# Patient Record
Sex: Male | Born: 1950 | Race: Black or African American | Hispanic: No | Smoking: Current every day smoker
Health system: Southern US, Community
[De-identification: ages and names within clinical notes are randomized; demographics above are authoritative.]

## PROBLEM LIST (undated history)

## (undated) DIAGNOSIS — E43 Unspecified severe protein-calorie malnutrition: Secondary | ICD-10-CM

## (undated) DIAGNOSIS — I132 Hypertensive heart and chronic kidney disease with heart failure and with stage 5 chronic kidney disease, or end stage renal disease: Secondary | ICD-10-CM

## (undated) DIAGNOSIS — N186 End stage renal disease: Secondary | ICD-10-CM

## (undated) DIAGNOSIS — E785 Hyperlipidemia, unspecified: Secondary | ICD-10-CM

## (undated) DIAGNOSIS — I10 Essential (primary) hypertension: Secondary | ICD-10-CM

## (undated) DIAGNOSIS — I739 Peripheral vascular disease, unspecified: Secondary | ICD-10-CM

## (undated) DIAGNOSIS — Z992 Dependence on renal dialysis: Secondary | ICD-10-CM

## (undated) DIAGNOSIS — I5022 Chronic systolic (congestive) heart failure: Secondary | ICD-10-CM

## (undated) DIAGNOSIS — Z720 Tobacco use: Secondary | ICD-10-CM

## (undated) DIAGNOSIS — B1910 Unspecified viral hepatitis B without hepatic coma: Secondary | ICD-10-CM

---

## 2003-05-06 ENCOUNTER — Emergency Department
Admission: RE | Admit: 2003-05-06 | Payer: Self-pay | Source: Emergency Department | Attending: Emergency Medicine | Admitting: Emergency Medicine

## 2008-03-20 ENCOUNTER — Observation Stay
Admission: AD | Admit: 2008-03-20 | Disposition: A | Discharge status: Home / Self Care | Payer: Self-pay | Source: Ambulatory Visit | Admitting: Internal Medicine

## 2008-03-20 LAB — COMPREHENSIVE METABOLIC PANEL
ALT: 23 U/L (ref 3–36)
AST (SGOT): 21 U/L (ref 10–41)
Albumin/Globulin Ratio: 1 — ABNORMAL LOW (ref 1.1–1.8)
Albumin: 2.8 g/dL — ABNORMAL LOW (ref 3.4–4.9)
Alkaline Phosphatase: 82 U/L (ref 43–112)
BUN: 57 mg/dL — ABNORMAL HIGH (ref 8–20)
Bilirubin, Total: <0.1 mg/dL (ref 0.1–1.0)
CO2: 16 mEq/L — ABNORMAL LOW (ref 21–30)
Calcium: 8.2 mg/dL — ABNORMAL LOW (ref 8.6–10.2)
Chloride: 111 mEq/L — ABNORMAL HIGH (ref 98–107)
Creatinine: 6.6 mg/dL — ABNORMAL HIGH (ref 0.6–1.5)
Globulin: 2.7 g/dL (ref 2.0–3.7)
Glucose: 121 mg/dL — ABNORMAL HIGH (ref 70–100)
Potassium: 4.5 mEq/L (ref 3.6–5.0)
Protein, Total: 5.5 g/dL — ABNORMAL LOW (ref 6.0–8.0)
Sodium: 139 mEq/L (ref 136–146)

## 2008-03-20 LAB — MAGNESIUM: Magnesium: 1.7 mg/dL (ref 1.6–2.3)

## 2008-03-20 LAB — GFR

## 2008-03-20 LAB — CBC AND DIFFERENTIAL
Basophils Absolute: 0 /mm3 (ref 0.0–0.2)
Basophils: 0 % (ref 0–2)
Eosinophils Absolute: 0.1 /mm3 (ref 0.0–0.7)
Eosinophils: 1 % (ref 0–5)
Granulocytes Absolute: 9.6 /mm3 — ABNORMAL HIGH (ref 1.8–8.1)
Hematocrit: 26.2 % — ABNORMAL LOW (ref 42.0–52.0)
Hgb: 8.2 G/DL — ABNORMAL LOW (ref 13.0–17.0)
Immature Granulocytes Absolute: 0.1
Immature Granulocytes: 0 %
Lymphocytes Absolute: 2.6 /mm3 (ref 0.5–4.4)
Lymphocytes: 19 % (ref 15–41)
MCH: 26.7 PG — ABNORMAL LOW (ref 28.0–32.0)
MCHC: 31.3 G/DL — ABNORMAL LOW (ref 32.0–36.0)
MCV: 85.3 FL (ref 80.0–100.0)
MPV: 9.2 FL — ABNORMAL LOW (ref 9.4–12.3)
Monocytes Absolute: 1.2 /mm3 (ref 0.0–1.2)
Monocytes: 9 % (ref 0–11)
Neutrophils %: 71 % (ref 52–75)
Platelets: 222 /mm3 (ref 140–400)
RBC: 3.07 /mm3 — ABNORMAL LOW (ref 4.70–6.00)
RDW: 16.9 % — ABNORMAL HIGH (ref 11.5–15.0)
WBC: 13.55 /mm3 — ABNORMAL HIGH (ref 3.50–10.80)

## 2008-03-20 LAB — FOLATE: Folate: 7.1 ng/mL (ref 2.8–20.0)

## 2008-03-20 LAB — IRON PROFILE
Iron Saturation: 26 % (ref 15–50)
Iron: 45 ug/dL — ABNORMAL LOW (ref 49–181)
TIBC: 173 ug/dL — ABNORMAL LOW (ref 261–462)

## 2008-03-20 LAB — HEPATITIS B SURFACE ANTIGEN W/ REFLEX TO CONFIRMATION: Hepatitis B Surface Antigen: NEGATIVE

## 2008-03-20 LAB — HEPATITIS C ANTIBODY: Hepatitis C, AB: REACTIVE

## 2008-03-20 LAB — PHOSPHORUS: Phosphorus: 6.5 mg/dL — ABNORMAL HIGH (ref 2.5–4.5)

## 2008-03-20 LAB — VITAMIN B12: Vitamin B-12: 386 pg/mL (ref 225.0–950.0)

## 2008-03-20 LAB — FERRITIN: Ferritin: 63.9 ng/mL (ref 18.0–464.0)

## 2008-03-20 LAB — HEPATITIS B SURFACE ANTIBODY: HBSAB QN: 238 m[IU]/mL

## 2008-03-21 LAB — CBC AND DIFFERENTIAL
Basophils Absolute: 0 /mm3 (ref 0.0–0.2)
Basophils: 0 % (ref 0–2)
Eosinophils Absolute: 0.2 /mm3 (ref 0.0–0.7)
Eosinophils: 1 % (ref 0–5)
Granulocytes Absolute: 10.4 /mm3 — ABNORMAL HIGH (ref 1.8–8.1)
Hematocrit: 29 % — ABNORMAL LOW (ref 42.0–52.0)
Hgb: 9 G/DL — ABNORMAL LOW (ref 13.0–17.0)
Immature Granulocytes Absolute: 0.1
Immature Granulocytes: 1 %
Lymphocytes Absolute: 2.4 /mm3 (ref 0.5–4.4)
Lymphocytes: 17 % (ref 15–41)
MCH: 26.2 PG — ABNORMAL LOW (ref 28.0–32.0)
MCHC: 31 G/DL — ABNORMAL LOW (ref 32.0–36.0)
MCV: 84.3 FL (ref 80.0–100.0)
MPV: 9.1 FL — ABNORMAL LOW (ref 9.4–12.3)
Monocytes Absolute: 1.5 /mm3 — ABNORMAL HIGH (ref 0.0–1.2)
Monocytes: 10 % (ref 0–11)
Neutrophils %: 71 % (ref 52–75)
Platelets: 209 /mm3 (ref 140–400)
RBC: 3.44 /mm3 — ABNORMAL LOW (ref 4.70–6.00)
RDW: 16.7 % — ABNORMAL HIGH (ref 11.5–15.0)
WBC: 14.61 /mm3 — ABNORMAL HIGH (ref 3.50–10.80)

## 2008-03-21 LAB — URINALYSIS
Bilirubin, UA: NEGATIVE
Blood, UA: NEGATIVE
Glucose, UA: NEGATIVE
Ketones UA: NEGATIVE
Leukocyte Esterase, UA: NEGATIVE
Nitrite, UA: NEGATIVE
Protein, UR: 100 — AB
Specific Gravity UA POCT: 1.008 (ref 1.001–1.035)
Urine pH: 8 (ref 5.0–8.0)
Urobilinogen, UA: NORMAL mg/dL

## 2008-03-21 LAB — PHOSPHORUS: Phosphorus: 4.9 mg/dL — ABNORMAL HIGH (ref 2.5–4.5)

## 2008-03-21 LAB — URIC ACID: Uric acid: 4.3 mg/dL (ref 4.0–8.8)

## 2008-03-21 LAB — BASIC METABOLIC PANEL
BUN: 47 mg/dL — ABNORMAL HIGH (ref 8–20)
CO2: 21 mEq/L (ref 21–30)
Calcium: 8.2 mg/dL — ABNORMAL LOW (ref 8.6–10.2)
Chloride: 109 mEq/L — ABNORMAL HIGH (ref 98–107)
Creatinine: 5.6 mg/dL — ABNORMAL HIGH (ref 0.6–1.5)
Glucose: 93 mg/dL (ref 70–100)
Potassium: 4.4 mEq/L (ref 3.6–5.0)
Sodium: 138 mEq/L (ref 136–146)

## 2008-03-21 LAB — MAGNESIUM: Magnesium: 1.7 mg/dL (ref 1.6–2.3)

## 2008-03-21 LAB — RPR: RPR: NONREACTIVE

## 2008-03-24 LAB — HEPATITIS C VIRUS AB, RIBA CERNER

## 2009-02-28 ENCOUNTER — Emergency Department (HOSPITAL_COMMUNITY): Admission: EM | Admit: 2009-02-28 | Discharge: 2009-03-01 | Payer: Self-pay | Admitting: Emergency Medicine

## 2011-05-19 NOTE — H&P (Signed)
Account Number: [id number]      Document ID: [id number]      Admit Date: 03/20/2008            Patient Location: F1082-02      Patient Type: V            ATTENDING PHYSICIAN: Yvonna Alanis, MD                  CHIEF COMPLAINT:      High potassium and needs dialysis.            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  He has history      of AV shunt on his left forearm about 5 months ago, per the patient, and he      has been followed by with nephrologist.  Burgess Estelle his potassium was 6.7      after Kayexalate.  This morning, potassium came down to 5, and he was      arranged to come to the hospital for intensive dialysis today and tomorrow.       Patient is asymptomatic.  He denies any shortness of breath or leg      swelling, or mental status change, or nausea, vomiting.  By the time I saw      the patient at 4:00, he had his dialysis today.            HOME MEDICATIONS:      Sodium bicarb 650 mg daily, Cozaar 100 mg daily, Procrit 10,000 mg q.2      weeks, PhosLo 667 mg b.i.d. with meals, Vitron C 1 tablet daily, clonidine      0.2 mg p.o. t.i.d. a day, allopurinol 200 mg p.o. every day, colchicine 0.6      mg p.o. daily, and Norvasc 10 mg p.o. daily, lovastatin 20 mg p.o. q.h.s.            PAST MEDICAL HISTORY:      As mentioned above.  Uncontrolled hypertension, end-stage renal disease.      Today is his first dialysis.  Gout and anemia related to renal disease.            PAST SURGICAL HISTORY:      Left forearm AV shunt placement in probably March 2009.            SOCIAL HISTORY:      He smokes 1 pack a day for about 40 years.  He quit drinking alcohol.  He      lives alone.  Works for [employer] for [employer]	[place].            REVIEW OF SYSTEMS:      HEENT:  Denies coughing or sinus congestion, ear  pain.      GENERAL:  Denies fever or chills.      PULMONARY:  Denies coughing or shortness of breath.      CARDIAC:  Denies palpitation or chest pain.      GASTROINTESTINAL:  Denies nausea, vomiting, abdominal pain, constipation,      or diarrhea.      GENITOURINARY:  Denies dysuria.  He does make urine.      MUSCULOSKELETAL:  When he has a gout flareup, he normally gets pain,  swelling, tenderness over his knees and elbows, and he does not have any of      those problem right now, and he usually takes prednisone.            PHYSICAL EXAMINATION:      VITAL SIGNS:  Temperature 97.6, heart rate 78, respiratory rate 16, blood      pressure 177/87, O2 sat 100% on room air.      GENERAL:  This is an [place] American male, age appropriate, appears      comfortable, alert and oriented x3.      HEENT:  Pupils equal, round, reactive to light.  Oral no lesion.  No sinus      congestion.      NECK:  No JVD, no lymphadenopathy, no thyromegaly.      LUNGS:  Clear throughout.  No wheezing or crackles.      HEART:  Regular rate, rhythm without murmur or gallops, or rubs.      ABDOMEN:  Soft, nontender, nondistended.      EXTREMITIES:  No pedal edema.  2+ peripheral pulses.      NEUROLOGIC:   No focal deficits seen.      SKIN:  There is a tiny red, raised, with a white head comedone on his left      face.            LABORATORY DATA:      WBC 13.5, hemoglobin 8.2, hematocrit 26.2, platelets 222.  Sodium 139,      potassium 4.5, chloride 111, CO2 16, BUN 57, creatinine 6.6, glucose 121,      calcium 8.2, magnesium 1.7, phosphorous 6.5.  LFT within normal range.      Iron 45, and TIBC 173.            ASSESSMENT AND PLAN:      This is a 60 year old African American male with end-stage renal disease,      uncontrolled hypertension, anemia due to renal, brought here because his      potassium went up to 6.7 without dialysis.  So, today is his first      dialysis.  He needs to stay in the hospital for observation, and he is       going to have dialysis again tomorrow, and then renal will arrange him for      routine dialysis probably.  He is going to be continuing his home      medication for hypertension and for gout, which is stable at this time.      The blood pressure is elevated, which is his chronic problem, also probably      because he skipped his lunch dose today, so we are going to make sure he      gets his evening dose.  The anemia definitely consistent with end-stage      renal disease.  The nephrologist already made arrangement for Epogen during      dialysis, and he gets low-dose iron for that as well.  Tomorrow, after      dialysis, if he still does well, then he can be discharged.                        Electronic Signing Provider      _______________________________     Date/Time Signed: _____________      Yvonna Alanis MD ([id number])            D:  03/20/2008 16:22 PM by Yvonna Alanis, MD ([id number])      T:  03/20/2008 17:26 PM by [id number]          Everlean Cherry: [id number]) (Doc ID: [id number])                  cc:

## 2015-01-19 ENCOUNTER — Telehealth: Payer: Self-pay | Admitting: Surgery

## 2015-01-19 NOTE — Telephone Encounter (Signed)
01/19/2015 KC  Called patient and left my second message will try and call patient back tomorrow.

## 2015-01-20 ENCOUNTER — Telehealth: Payer: Self-pay | Admitting: Surgery

## 2015-01-20 NOTE — Telephone Encounter (Signed)
01/20/2015 KC  I have attempted to call the patient four times and unfortunately he has not called me back. I will try a couple of more times but if there is no answer I will wait for the patient to call me.

## 2016-02-11 ENCOUNTER — Encounter (HOSPITAL_COMMUNITY): Payer: Self-pay | Admitting: Emergency Medicine

## 2016-02-11 ENCOUNTER — Telehealth: Payer: Self-pay | Admitting: *Deleted

## 2016-02-11 ENCOUNTER — Emergency Department (HOSPITAL_COMMUNITY)
Admission: EM | Admit: 2016-02-11 | Discharge: 2016-02-11 | Disposition: A | Discharge status: Home / Self Care | Payer: Medicare Other | Attending: Emergency Medicine | Admitting: Emergency Medicine

## 2016-02-11 DIAGNOSIS — F17210 Nicotine dependence, cigarettes, uncomplicated: Secondary | ICD-10-CM | POA: Diagnosis not present

## 2016-02-11 DIAGNOSIS — N186 End stage renal disease: Secondary | ICD-10-CM

## 2016-02-11 DIAGNOSIS — Z992 Dependence on renal dialysis: Secondary | ICD-10-CM | POA: Diagnosis present

## 2016-02-11 LAB — BASIC METABOLIC PANEL
ANION GAP: 17 — AB (ref 5–15)
BUN: 47 mg/dL — AB (ref 6–20)
CALCIUM: 10.5 mg/dL — AB (ref 8.9–10.3)
CO2: 32 mmol/L (ref 22–32)
CREATININE: 10.36 mg/dL — AB (ref 0.61–1.24)
Chloride: 88 mmol/L — ABNORMAL LOW (ref 101–111)
GFR calc Af Amer: 5 mL/min — ABNORMAL LOW (ref >60)
GFR, EST NON AFRICAN AMERICAN: 5 mL/min — AB (ref >60)
GLUCOSE: 87 mg/dL (ref 65–99)
Potassium: 4.5 mmol/L (ref 3.5–5.1)
Sodium: 137 mmol/L (ref 135–145)

## 2016-02-11 LAB — CBC
HEMATOCRIT: 40.6 % (ref 39.0–52.0)
Hemoglobin: 12.7 g/dL — ABNORMAL LOW (ref 13.0–17.0)
MCH: 26.4 pg (ref 26.0–34.0)
MCHC: 31.3 g/dL (ref 30.0–36.0)
MCV: 84.4 fL (ref 78.0–100.0)
PLATELETS: 179 10*3/uL (ref 150–400)
RBC: 4.81 MIL/uL (ref 4.22–5.81)
RDW: 14.9 % (ref 11.5–15.5)
WBC: 8.5 10*3/uL (ref 4.0–10.5)

## 2016-02-11 NOTE — ED Notes (Signed)
Pt in needing dialysis, MWF routine

## 2016-02-11 NOTE — ED Provider Notes (Signed)
CSN: [id number]     Arrival date & time 02/11/16  0555 History   First MD Initiated Contact with Patient 02/11/16 [phone]     Chief Complaint  Patient presents with  . Needs Dialysis     (Consider location/radiation/quality/duration/timing/severity/associated sxs/prior Treatment) HPI Comments: Patient with history of end-stage renal disease on dialysis for the past 4 years, with pacemaker/defibrillator -- presents with request for dialysis. Patient states that he moved from [place] yesterday. He did not tell his dialysis center that he was moving. Currently has no complaints. States that he does not feel fluid overloaded or short of breath. He states that he has not been drinking very much. No recent illnesses or other complaints. Patient dialyzes through a left upper extremity fistula. The onset of this condition was acute. The course is constant. Aggravating factors: none. Alleviating factors: none.   Previous dialysis center:  [facility] [phone] [address] Laurell Josephs. [place], [place] [address] [phone]  Per discussion with dialysis center: patient attempted to make arrangements to dialyze at [facility]. He was denied from this center due to previous behavioral issues.   The history is provided by the patient.    Past Medical History  Diagnosis Date  . End stage renal disease (HCC)    History reviewed. No pertinent past surgical history. No family history on file. Social History  Substance Use Topics  . Smoking status: None  . Smokeless tobacco: None  . Alcohol Use: None    Review of Systems  Constitutional: Negative for fever.  HENT: Negative for rhinorrhea and sore throat.   Eyes: Negative for redness.  Respiratory: Negative for cough.   Cardiovascular: Negative for chest pain and leg swelling.  Gastrointestinal: Negative for nausea, vomiting, abdominal pain and diarrhea.  Genitourinary: Negative for dysuria.  Musculoskeletal: Negative for myalgias.   Skin: Negative for rash.  Neurological: Negative for headaches.      Allergies  Review of patient's allergies indicates no known allergies.  Home Medications   Prior to Admission medications   Not on File   BP 101/72 mmHg  Pulse 81  Temp(Src) 97.5 F (36.4 C) (Oral)  Resp 17  Ht  (1.88 m)  Wt 69 kg  BMI 19.52 kg/m2  SpO2 97%   Physical Exam  Constitutional: He appears well-developed and well-nourished.  HENT:  Head: Normocephalic and atraumatic.  Mouth/Throat: Oropharynx is clear and moist.  Eyes: Conjunctivae are normal. Pupils are equal, round, and reactive to light. Right eye exhibits no discharge. Left eye exhibits no discharge.  Neck: Normal range of motion. Neck supple. JVD (Mild) present.  Cardiovascular: Normal rate, regular rhythm and normal heart sounds.   No murmur heard. Pulmonary/Chest: Effort normal and breath sounds normal. No respiratory distress. He has no wheezes. He has no rales.  Abdominal: Soft. There is no tenderness.  Musculoskeletal: He exhibits no edema or tenderness.  Neurological: He is alert.  Skin: Skin is warm and dry.  Psychiatric: He has a normal mood and affect.  Nursing note and vitals reviewed.   ED Course  Procedures (including critical care time) Labs Review Labs Reviewed  CBC - Abnormal; Notable for the following:    Hemoglobin 12.7 (*)    All other components within normal limits  BASIC METABOLIC PANEL - Abnormal; Notable for the following:    Chloride 88 (*)    BUN 47 (*)    Creatinine, Ser 10.36 (*)    Calcium 10.5 (*)    GFR calc  non Af Amer 5 (*)    GFR calc Af Amer 5 (*)    Anion gap 17 (*)    All other components within normal limits     EKG Interpretation   Date/Time:  Friday February 11 2016 06:43:26 EDT Ventricular Rate:  82 PR Interval:    QRS Duration: 172 QT Interval:  431 QTC Calculation: 504 R Axis:   -107 Text Interpretation:  Age not entered, assumed to be  65 years old for  purpose of ECG  interpretation Atrial-sensed ventricular-paced rhythm No  further analysis attempted due to paced rhythm Confirmed by POLLINA  MD,  CHRISTOPHER [phone]) on 02/11/2016 6:47:43 AM      6:43 AM Patient seen and examined. Work-up initiated. Will evaluate for need for emergent dialysis.    Vital signs reviewed and are as follows: BP 118/78 mmHg  Pulse 77  Temp(Src) 97.5 F (36.4 C) (Oral)  Resp 17  Ht 6\' 2"  (1.88 m)  Wt 69 kg  BMI 19.52 kg/m2  SpO2 97%  7:48 AM Labs as expected for dialysis patient.   Spoke with Dr. Abel Presto. Reccs case manager to be involved to make arrangements for dialysis. Will contact after 8am.   9:23 AM Case manager recommends that patient call his facility to arrange transfer to dialysis center in [place].   Dr. Freida Busman aware of patient and discussions held with case management, patient, and nursing.   I called the patient's dialysis center on his behalf. I spoke with a manager there who states that they tried to set up patient at dialysis here about a month ago. He was declined after discussion due to prior behavioral issues. Patient did not notify the center on Wednesday that he was moving. They are happy to send any records but state patient will have to contact dialysis centers and provide contact info to his previous dialysis center.   9:52 AM I called Dr. Abel Presto again to see if he had other recommendations prior to discharge. He took patient's information and will have his office check to see about patient's status.   At this point, patient does not need emergent dialysis. I informed him of the multiple discussions occuring today. I have provided him with a list of dialysis centers in [place] with contact information.   I encouraged him to return if he develops shortness of breath, chest pain, lightheadedness, fever, or other symptoms he feels are related to not having dialysis. I've encouraged him to return in 48 hours for recheck if he does not hear  anything about dialyzing at an outpatient facility. Patient visibly unhappy, however understands current situation and plan.  Will discharge to home at this time.   MDM   Final diagnoses:  End stage renal disease (HCC)   Patient with end-stage renal disease, new to the area and does not have a dialysis center. Multiple discussions held as above and attempt to arrange dialysis here. At this point, patient will be discharged from the emergency department with no concrete plan for dialysis. He has no current indications for emergent dialysis. Patient does not appear fluid overloaded and does not have any shortness of breath. No lower extremity edema. No chest pain. Patient has a pacemaker and defibrillator. Potassium is normal range. BUN/creatinine is as expected, no gross symptoms or complications from uremia. Follow-up as above.   Renne Crigler, PA-C 02/11/16 [id number]  Gilda Crease, MD 02/12/16 [phone]

## 2016-02-11 NOTE — Discharge Instructions (Signed)
Return to the Emergency Department if you have shortness of breath, chest pain, fever, lightheadedness, or other concerns. If you feel well, return in 48 hours to be rechecked.   You may contact one of the centers listed below to attempt to arrange dialysis:   [facility] [place], [place] [phone] Open until 5:00 PM  [facility] [place], [place] [phone]  [facility] [place], [place] [phone] Open until 5:00 PM  [facility] [place], [place] [phone] Open until 5:00 PM  [facility] [place], [place] [phone] Open until 5:00 PM  [facility] [place], [place] [phone]

## 2016-02-11 NOTE — Progress Notes (Signed)
1015 ED CM faxed clinicals to [phone] with fax confirmation Sarah at [facility] CM, EDPA Ivin Booty and ED RN updated

## 2016-02-11 NOTE — Progress Notes (Addendum)
ED CM contacted by [facility] ED SW, Morrie Sheldon about this pt in need of getting set up with a dialysis, pt just moved here from [place] on yesterday, He did not tell his dialysis center he was moving Gets Dialysis MWF and wants to get dialysis today  Morrie Sheldon stated that the [facility] ED RN Chrislyn x [phone] told her they would not provide dialysis today as it is not emergent and the pt is not thrilled about that, MD is wanting a plan prior to d/c  ED Cm review briefly ED NP/PA note  0820 Cm called to speak with pt ED RN who reported pt had been "inappropriate" with staff but was available for Cm to speak with via telephone  CM inquire of pt the name of his dialysis company Pt informed Cm he left [place] on "yesterday to come visit my mother" Pt informed Cm he did not tell his [facility] staff in [place] that he was leaving yesterday Cm discussed with the pt the importance and his responsibility of always contacting his dialysis center of any change including his location. Pt states he was told if he had any problems to go to the ED" Pt got his previous dialysis card out of his pocket to confirm the demographics his previous dialysis center Cm discussed with pt she was informed by ED SW that the Rn stated dialysis would not be provided today as it is not emergent Pt voiced frustration of not being told by ED staff that dialysis could not be done Cm referred him to his ED RN and ED PA/NP Another ED RN came to pt room after he started hollering for a nurse vs pressing his call button.  Cm able to get the nurse to confirmed [phone] as the number for the dialysis center since the pt stated he could not see CM encouraged to call the dialysis on his cell with ED Cm on the other line Cm assisted with providing the contact number as he entered it in. Cm never noted pt speaking with staff but told Cm they would not answer Pt can not recall his SW name or other contacts at the facility Pt in conversation states "this is why I left them. The  social worker there was not worth shit."  Referring to the dialysis center in [place] Pt disconnected from ED CM stating "who does this fucking phone belong to "  0830  CM attempted to reach a staff at [phone]  to assist her with pt but informed SW not there at this time 0852 CM received a call from [facility] ED RN Chrislyn who stated she heard CM spoke with pt. Cm informed her pt stated he left without telling his dialysis center he was leaving and they were assisting him with but Cm has to speak with the correct person who is familiar with the pt ED RN asked about pt d/c Cm referred her to ED PA/NP about pt d/c and informed her CM would return a call to her as soon as CM was able to speak with a dialysis staff about services for the this pt Asked RN if she could have the ED PA/NP speak with pt about ED plan of care and not being able to get dialysis after pt informed CM he had not been informed  [phone] ED CM finally able to find a staff member of pt previous dialysis center to speak with While speaking with Varinder because the general dialysis SW  is not there today Pt has received Dialysis last on "this Wednesday" Pt  Cm received a call from [facility] ED CM and informed her this Cm attempting to get an update/collateral on pt dialysis status and a possible transfer dialysis assistance to a local [employer] or near by facility. Varinder states pt informed them he would be moving initially in May 2017 and she sent his information to 3 facilities Pt was denied services at one facility's Wellsite geologist after peer to peers completed related pt "drugs" :there were so many issues with this patient" Pt was accepting by one facility but he refused the facility related to transportation issues to get to the facility in which Varinder states they were working on the transportation concerns. varinder was given Cm fax number to fax clinicals adn Cm mobile number She stated she would call the admission line there to  get the information on the 3 facilities arrangements were initiated with  She recalled that pt was accepted at [facility] in [place] when Cm read the names of the available dialysis center near [place] 1610 CM spoke with Suzette Battiest when called the [facility], [place] [phone] who confirms pt is listed as still active with the [facility] facility and the last request for transfer was noted in May 2017 but noted pt did not call nor show to this faciity 2017 She spoke with her Lead and was informed to take a referral from this Cm for at [facility] transfer of pt plus any fax clinicals in ATTN to [facility] (fax # [phone]) from this Cm (possible face sheet, treatment plan, labs, hep panel, etc) and then to call the [place] facility also to get information. Veronica given the [employer] address on [address] pt provided and telephone numbers in [place] noted availability at the [facility] center but pt acceptance is still pending a review "may take twenty four hours" She provided the request svc number as [phone]

## 2016-02-11 NOTE — ED Notes (Signed)
Pt on phone with Selena Batten, Case Manager at [facility], to establish regular dialysis.

## 2016-02-11 NOTE — Progress Notes (Signed)
CSW consult acknowledged. Patient is in need of getting set up with a dialysis center. He just moved to [place] from [place] on yesterday. He did not tell his dialysis center that he was moving. Patient gets dialysis on MWF and is requesting dialysis today. Patient will not receive dialysis today as it is not an emergent need. CSW has staffed with covering Case Manager for assistance with getting Patient set up with a dialysis center. No further CSW needs identified at this time. CSW signing off. Please contact if new need(s) arise.     Lance Muss, LCSW [facility] ED/66M Clinical Social Worker [phone]

## 2016-02-11 NOTE — ED Notes (Signed)
Pt moved from [place] yesterday, needs to get set up with a dialysis center

## 2016-02-11 NOTE — Progress Notes (Signed)
CSW engaged with Patient at his bedside alongside Bedside RN and Case Manager. CSW explained to Patient that he would need to contact his previous facility so that he can get clipped in [place]. CSW explained that RN Case Manager is working on assisting him with getting set up with a facility in [place]. CSW explained importance and his responsibility of always contacting his dialysis center of any change including his location.          Lance Muss, LCSW [facility] ED/62M Clinical Social Worker [phone]

## 2016-02-14 ENCOUNTER — Encounter (HOSPITAL_COMMUNITY): Payer: Self-pay | Admitting: Emergency Medicine

## 2016-02-14 ENCOUNTER — Non-Acute Institutional Stay (HOSPITAL_COMMUNITY)
Admission: EM | Admit: 2016-02-14 | Discharge: 2016-02-14 | Disposition: A | Discharge status: Home / Self Care | Payer: Medicare Other | Attending: Emergency Medicine | Admitting: Emergency Medicine

## 2016-02-14 DIAGNOSIS — Z992 Dependence on renal dialysis: Secondary | ICD-10-CM | POA: Diagnosis not present

## 2016-02-14 DIAGNOSIS — F17210 Nicotine dependence, cigarettes, uncomplicated: Secondary | ICD-10-CM | POA: Insufficient documentation

## 2016-02-14 DIAGNOSIS — E875 Hyperkalemia: Secondary | ICD-10-CM

## 2016-02-14 DIAGNOSIS — N186 End stage renal disease: Secondary | ICD-10-CM | POA: Diagnosis present

## 2016-02-14 LAB — CBC WITH DIFFERENTIAL/PLATELET
BASOS PCT: 0 %
Basophils Absolute: 0 10*3/uL (ref 0.0–0.1)
EOS ABS: 0.2 10*3/uL (ref 0.0–0.7)
Eosinophils Relative: 2 %
HEMATOCRIT: 39.7 % (ref 39.0–52.0)
HEMOGLOBIN: 12.7 g/dL — AB (ref 13.0–17.0)
LYMPHS ABS: 1.5 10*3/uL (ref 0.7–4.0)
Lymphocytes Relative: 15 %
MCH: 26.8 pg (ref 26.0–34.0)
MCHC: 32 g/dL (ref 30.0–36.0)
MCV: 83.8 fL (ref 78.0–100.0)
MONO ABS: 0.6 10*3/uL (ref 0.1–1.0)
MONOS PCT: 6 %
Neutro Abs: 7.8 10*3/uL — ABNORMAL HIGH (ref 1.7–7.7)
Neutrophils Relative %: 77 %
Platelets: 204 10*3/uL (ref 150–400)
RBC: 4.74 MIL/uL (ref 4.22–5.81)
RDW: 14.8 % (ref 11.5–15.5)
WBC: 10.1 10*3/uL (ref 4.0–10.5)

## 2016-02-14 LAB — BASIC METABOLIC PANEL
Anion gap: 16 — ABNORMAL HIGH (ref 5–15)
BUN: 85 mg/dL — AB (ref 6–20)
CALCIUM: 10.4 mg/dL — AB (ref 8.9–10.3)
CHLORIDE: 96 mmol/L — AB (ref 101–111)
CO2: 27 mmol/L (ref 22–32)
CREATININE: 17.02 mg/dL — AB (ref 0.61–1.24)
GFR calc Af Amer: 3 mL/min — ABNORMAL LOW (ref >60)
GFR calc non Af Amer: 3 mL/min — ABNORMAL LOW (ref >60)
Glucose, Bld: 81 mg/dL (ref 65–99)
Potassium: 5.7 mmol/L — ABNORMAL HIGH (ref 3.5–5.1)
Sodium: 139 mmol/L (ref 135–145)

## 2016-02-14 LAB — ALT: ALT: 8 U/L — AB (ref 17–63)

## 2016-02-14 MED ORDER — LIDOCAINE HCL (PF) 1 % IJ SOLN: 5.0000 mL | INTRAMUSCULAR | Status: DC | PRN

## 2016-02-14 MED ORDER — HEPARIN SODIUM (PORCINE) 1000 UNIT/ML DIALYSIS: 1000.0000 [IU] | INTRAMUSCULAR | Status: DC | PRN

## 2016-02-14 MED ORDER — SODIUM CHLORIDE 0.9 % IV SOLN: 100.0000 mL | INTRAVENOUS | Status: DC | PRN

## 2016-02-14 MED ORDER — PENTAFLUOROPROP-TETRAFLUOROETH EX AERO: 1.0000 "application " | INHALATION_SPRAY | CUTANEOUS | Status: DC | PRN

## 2016-02-14 MED ORDER — ALTEPLASE 2 MG IJ SOLR: 2.0000 mg | Freq: Once | INTRAMUSCULAR | Status: DC | PRN

## 2016-02-14 MED ORDER — LIDOCAINE-PRILOCAINE 2.5-2.5 % EX CREA: 1.0000 "application " | TOPICAL_CREAM | CUTANEOUS | Status: DC | PRN

## 2016-02-14 NOTE — Procedures (Signed)
Pt seen on HD- moved from [place] to help with sick family members- will be staying on [address] which I think is closest to [employer]  [facility] [employer] [facility] HD refused him due to "behavioral issues" pt admits to "cussing people out" due to access sticking and repeated procedures.  There was no issue in sticking here today.  [facility] has not refused but there may be a transportation issue as would be the issue if he were to try to go to [place].  I have told him to return here for treatment on Wednesday- I will see if [place] will be a possibility - have told Judi from HD to send papers.  Hopefully he will only need one more treatment here at [facility]-    Kaymarie Wynn A

## 2016-02-14 NOTE — Care Management Note (Signed)
Mr. Dolton has returned to [place] today.  States still has not heard from any dialysis centers.  States tried to get into HD on [address] - refused and on [address] - refused.  Informed patient that from notes it appears he has been refused by [facility], but [facility] and [facility] [facility] accepted but he initially declined these centers.  CM on Friday was working with [employer] on getting him set up.  States has not had a call from [facility] HD.  I contacted at [phone].  States Lattie Corns -working on svc number [phone].  Will call this CM back with update.  Talked with patient about [facility] working on getting him in.  Wants to know where it is in [place] - they have three and who transports and what the cost is.  Informed will check on those questions when they call back.  At this point Renal has been consulted.  Will continue to follow and wait for call back from [facility].

## 2016-02-14 NOTE — ED Provider Notes (Signed)
CSN: [id number]     Arrival date & time 02/14/16  1020 History   First MD Initiated Contact with Patient 02/14/16 1118     Chief Complaint  Patient presents with  . Vascular Access Problem    pt in need of dialysis     (Consider location/radiation/quality/duration/timing/severity/associated sxs/prior Treatment) HPI Patient reports that he is coming to the emergency department for dialysis. He reports last dialysis was Wednesday. That was 5 days ago. He states that he has been refused at the dialysis center that he was sent to last. He denies shortness of breath at this time. He denies swelling. Past Medical History  Diagnosis Date  . End stage renal disease (HCC)    History reviewed. No pertinent past surgical history. History reviewed. No pertinent family history. Social History  Substance Use Topics  . Smoking status: Current Every Day Smoker -- 0.50 packs/day    Types: Cigarettes  . Smokeless tobacco: None  . Alcohol Use: No    Review of Systems 10 Systems reviewed and are negative for acute change except as noted in the HPI.    Allergies  Review of patient's allergies indicates no known allergies.  Home Medications   Prior to Admission medications   Medication Sig Start Date End Date Taking? Authorizing Provider  cinacalcet (SENSIPAR) 30 MG tablet Take 30 mg by mouth daily.   Yes Historical Provider, MD  hydrOXYzine (ATARAX/VISTARIL) 50 MG tablet Take 50 mg by mouth every 8 (eight) hours as needed for itching.  12/25/15  Yes Historical Provider, MD  oxyCODONE-acetaminophen (PERCOCET) 10-325 MG tablet Take 1 tablet by mouth every 8 (eight) hours as needed for pain.   Yes Historical Provider, MD  temazepam (RESTORIL) 30 MG capsule Take 30 mg by mouth at bedtime. 01/19/16  Yes Historical Provider, MD   BP 127/74 mmHg  Pulse 82  Temp(Src) 97.8 F (36.6 C) (Oral)  Resp 20  Ht  (1.88 m)  Wt 154 lb 1.6 oz (69.9 kg)  BMI 19.78 kg/m2  SpO2 97% Physical Exam   Constitutional: He is oriented to person, place, and time.  The patient is thin. He is nontoxic and alert. No acute distress.  HENT:  Head: Normocephalic and atraumatic.  Eyes: EOM are normal.  Neck: Neck supple.  Cardiovascular: Normal rate, regular rhythm, normal heart sounds and intact distal pulses.   Pulmonary/Chest: Effort normal and breath sounds normal.  Abdominal: Soft. Bowel sounds are normal. He exhibits no distension. There is no tenderness.  Musculoskeletal: Normal range of motion. He exhibits no edema.  Patient has generalized muscular atrophy. He does not have peripheral edema.  Neurological: He is alert and oriented to person, place, and time. He has normal strength. Coordination normal. GCS eye subscore is 4. GCS verbal subscore is 5. GCS motor subscore is 6.  Skin: Skin is warm, dry and intact.  Patient has very dry, scaling skin. Multiple small areas of pigmentation. No acute appearing rashes.  Psychiatric: He has a normal mood and affect.    ED Course  Procedures (including critical care time) Labs Review Labs Reviewed  BASIC METABOLIC PANEL - Abnormal; Notable for the following:    Potassium 5.7 (*)    Chloride 96 (*)    BUN 85 (*)    Creatinine, Ser 17.02 (*)    Calcium 10.4 (*)    GFR calc non Af Amer 3 (*)    GFR calc Af Amer 3 (*)    Anion gap 16 (*)  All other components within normal limits  CBC WITH DIFFERENTIAL/PLATELET - Abnormal; Notable for the following:    Hemoglobin 12.7 (*)    Neutro Abs 7.8 (*)    All other components within normal limits    Imaging Review No results found. I have personally reviewed and evaluated these images and lab results as part of my medical decision-making.   EKG Interpretation None     Consult: Dr. Malachi Bonds will arrange dialysis as the patient has elevated potassium today. MDM   Final diagnoses:  ESRD (end stage renal disease) (HCC)  Hyperkalemia   Patient presents asymptomatic reporting he needs  dialysis. Patient has moved from out of town. He does not have dyspnea or rails. He does not have peripheral edema. Labs however do show potassium at 5.7 and at this point patient will require scheduled dialysis. Dr. Westley Chandler will arrange a dialysis time for him. He otherwise has no acute medical complaints. Case Management has been involved in trying to establish patient's ongoing care.    Arby Barrette, MD 02/14/16 [phone]

## 2016-02-14 NOTE — Progress Notes (Signed)
Patient discharged home tolerated well. Ambulated off unit with belongings.

## 2016-02-14 NOTE — ED Notes (Signed)
Pt comes in needing dialysis. Pt had dialysis last Wednesday. Pt on MWF dialysis schedule. Pt has +A/V fistula to left lower arm. Pt in NAD.

## 2016-02-15 ENCOUNTER — Telehealth: Payer: Self-pay | Admitting: *Deleted

## 2016-02-15 LAB — HEPATITIS B CORE ANTIBODY, TOTAL: Hep B Core Total Ab: POSITIVE — AB

## 2016-02-15 LAB — HEPATITIS B SURFACE ANTIBODY,QUALITATIVE: Hep B S Ab: REACTIVE

## 2016-02-15 LAB — HEPATITIS B SURFACE ANTIGEN: Hepatitis B Surface Ag: NEGATIVE

## 2016-02-16 ENCOUNTER — Non-Acute Institutional Stay (HOSPITAL_COMMUNITY)
Admission: EM | Admit: 2016-02-16 | Discharge: 2016-02-16 | Disposition: A | Discharge status: Home / Self Care | Payer: Medicare Other | Attending: Emergency Medicine | Admitting: Emergency Medicine

## 2016-02-16 ENCOUNTER — Encounter (HOSPITAL_COMMUNITY): Payer: Self-pay | Admitting: Emergency Medicine

## 2016-02-16 DIAGNOSIS — Z992 Dependence on renal dialysis: Secondary | ICD-10-CM | POA: Insufficient documentation

## 2016-02-16 DIAGNOSIS — N186 End stage renal disease: Secondary | ICD-10-CM | POA: Diagnosis present

## 2016-02-16 DIAGNOSIS — F17210 Nicotine dependence, cigarettes, uncomplicated: Secondary | ICD-10-CM | POA: Insufficient documentation

## 2016-02-16 HISTORY — DX: End stage renal disease: N18.6

## 2016-02-16 HISTORY — DX: Dependence on renal dialysis: Z99.2

## 2016-02-16 LAB — BASIC METABOLIC PANEL
ANION GAP: 16 — AB (ref 5–15)
BUN: 39 mg/dL — AB (ref 6–20)
CHLORIDE: 96 mmol/L — AB (ref 101–111)
CO2: 27 mmol/L (ref 22–32)
Calcium: 10.5 mg/dL — ABNORMAL HIGH (ref 8.9–10.3)
Creatinine, Ser: 11.9 mg/dL — ABNORMAL HIGH (ref 0.61–1.24)
GFR calc Af Amer: 4 mL/min — ABNORMAL LOW (ref >60)
GFR, EST NON AFRICAN AMERICAN: 4 mL/min — AB (ref >60)
Glucose, Bld: 168 mg/dL — ABNORMAL HIGH (ref 65–99)
POTASSIUM: 4.6 mmol/L (ref 3.5–5.1)
SODIUM: 139 mmol/L (ref 135–145)

## 2016-02-16 LAB — CBC WITH DIFFERENTIAL/PLATELET
BASOS ABS: 0 10*3/uL (ref 0.0–0.1)
Basophils Relative: 0 %
EOS PCT: 2 %
Eosinophils Absolute: 0.3 10*3/uL (ref 0.0–0.7)
HEMATOCRIT: 42.6 % (ref 39.0–52.0)
HEMOGLOBIN: 13.1 g/dL (ref 13.0–17.0)
LYMPHS ABS: 2.3 10*3/uL (ref 0.7–4.0)
LYMPHS PCT: 22 %
MCH: 26.3 pg (ref 26.0–34.0)
MCHC: 30.8 g/dL (ref 30.0–36.0)
MCV: 85.4 fL (ref 78.0–100.0)
Monocytes Absolute: 0.8 10*3/uL (ref 0.1–1.0)
Monocytes Relative: 8 %
NEUTROS ABS: 7.1 10*3/uL (ref 1.7–7.7)
NEUTROS PCT: 68 %
PLATELETS: 224 10*3/uL (ref 150–400)
RBC: 4.99 MIL/uL (ref 4.22–5.81)
RDW: 15 % (ref 11.5–15.5)
WBC: 10.6 10*3/uL — AB (ref 4.0–10.5)

## 2016-02-16 MED ORDER — SODIUM CHLORIDE 0.9 % IV SOLN: 100.0000 mL | INTRAVENOUS | Status: DC | PRN

## 2016-02-16 MED ORDER — HEPARIN SODIUM (PORCINE) 1000 UNIT/ML DIALYSIS
1000.0000 [IU] | INTRAMUSCULAR | Status: DC | PRN
  Filled 2016-02-16: qty 1

## 2016-02-16 MED ORDER — HEPARIN SODIUM (PORCINE) 1000 UNIT/ML DIALYSIS
20.0000 [IU]/kg | INTRAMUSCULAR | Status: DC | PRN
  Filled 2016-02-16: qty 2

## 2016-02-16 MED ORDER — LIDOCAINE HCL (PF) 1 % IJ SOLN: 5.0000 mL | INTRAMUSCULAR | Status: DC | PRN

## 2016-02-16 MED ORDER — LIDOCAINE-PRILOCAINE 2.5-2.5 % EX CREA
1.0000 | TOPICAL_CREAM | CUTANEOUS | Status: DC | PRN
Start: 2016-02-16 — End: 2016-02-16

## 2016-02-16 MED ORDER — PENTAFLUOROPROP-TETRAFLUOROETH EX AERO
1.0000 | INHALATION_SPRAY | CUTANEOUS | Status: DC | PRN
Start: 2016-02-16 — End: 2016-02-16
  Filled 2016-02-16: qty 30

## 2016-02-16 MED ORDER — ALTEPLASE 2 MG IJ SOLR: 2.0000 mg | Freq: Once | INTRAMUSCULAR | Status: DC | PRN

## 2016-02-16 NOTE — ED Provider Notes (Signed)
CSN: [id number]     Arrival date & time 02/16/16  0554 History   First MD Initiated Contact with Patient 02/16/16 0602     CC: here for dialysis  HPI   Willie Andrade is an 65 y.o. male with history of ESRD on dialysis who presents to the ED today for scheduled dialysis. He recently moved to [place] from [place] and is in the process of establishing local dialysis center. He has been seen in the [facility] ED twice before and has worked with our nephrology team and case management to establish local dialysis care. He was last seen this Monday and was told by Dr. Kathrene Bongo to return to day for dialysis. This is true based on EMR review. Pt denies any symptoms at this time. Denies chest pain, SOB, swelling, urinary problems, nausea, vomiting.   Past Medical History  Diagnosis Date  . End stage renal disease (HCC)   . ESRD (end stage renal disease) on dialysis [facility])    History reviewed. No pertinent past surgical history. No family history on file. Social History  Substance Use Topics  . Smoking status: Current Every Day Smoker -- 0.00 packs/day    Types: Cigarettes  . Smokeless tobacco: None  . Alcohol Use: No    Review of Systems  All other systems reviewed and are negative.     Allergies  Review of patient's allergies indicates no known allergies.  Home Medications   Prior to Admission medications   Medication Sig Start Date End Date Taking? Authorizing Provider  cinacalcet (SENSIPAR) 30 MG tablet Take 30 mg by mouth daily.   Yes Historical Provider, MD  hydrOXYzine (ATARAX/VISTARIL) 50 MG tablet Take 50 mg by mouth every 8 (eight) hours as needed for itching.  12/25/15  Yes Historical Provider, MD  oxyCODONE-acetaminophen (PERCOCET) 10-325 MG tablet Take 1 tablet by mouth every 8 (eight) hours as needed for pain.   Yes Historical Provider, MD  temazepam (RESTORIL) 30 MG capsule Take 30 mg by mouth at bedtime. 01/19/16  Yes Historical Provider, MD   BP 115/79 mmHg  Pulse 63   Temp(Src) 97.6 F (36.4 C) (Oral)  Resp 18  SpO2 79% Physical Exam  Constitutional: He is oriented to person, place, and time. No distress.  HENT:  Head: Atraumatic.  Right Ear: External ear normal.  Left Ear: External ear normal.  Nose: Nose normal.  Eyes: Conjunctivae are normal. No scleral icterus.  Cardiovascular: Normal rate, regular rhythm and normal heart sounds.   Pulmonary/Chest: Effort normal and breath sounds normal. No respiratory distress. He has no wheezes. He has no rales.  Abdominal: He exhibits no distension.  Musculoskeletal:  No LE edema  Neurological: He is alert and oriented to person, place, and time.  Skin: Skin is warm and dry. He is not diaphoretic.  Psychiatric: He has a normal mood and affect. His behavior is normal.  Nursing note and vitals reviewed.   ED Course  Procedures (including critical care time) Labs Review Labs Reviewed  BASIC METABOLIC PANEL - Abnormal; Notable for the following:    Chloride 96 (*)    Glucose, Bld 168 (*)    BUN 39 (*)    Creatinine, Ser 11.90 (*)    Calcium 10.5 (*)    GFR calc non Af Amer 4 (*)    GFR calc Af Amer 4 (*)    Anion gap 16 (*)    All other components within normal limits  CBC WITH DIFFERENTIAL/PLATELET - Abnormal; Notable for the following:  WBC 10.6 (*)    All other components within normal limits    Imaging Review No results found. I have personally reviewed and evaluated these images and lab results as part of my medical decision-making.   EKG Interpretation None      MDM   Final diagnoses:  Encounter for dialysis (HCC)    Pt nontoxic appearing with nonfocal exam. He is asymptomatic. Does not appear fluid overloaded. Will check basic labs and plan on consulting nephrology for today's scheduled/expected dialysis session.  Labs are unremarkable and as expected for ESRD patient. Potassium WNL. Will consult nephrology for dialysis.   8:07 AM I spoke with Dr. Kathrene Bongo who is  expecting patient and will put in orders for dialysis today.  Carlene Coria, PA-C 02/16/16 1610  Laurence Spates, MD 02/16/16 [phone]

## 2016-02-16 NOTE — Progress Notes (Signed)
Patient tolerated treatment without complications, VSS, see flowsheets.  Patient alert and oriented x4. Patient discharged to home per orders and all belongings taken.

## 2016-02-16 NOTE — ED Notes (Signed)
Pt. is here for his routine hemodialysis , last treatment Monday , no complaints , denies pain/respirations unlabored .

## 2016-02-18 ENCOUNTER — Non-Acute Institutional Stay (HOSPITAL_COMMUNITY)
Admission: EM | Admit: 2016-02-18 | Discharge: 2016-02-18 | Disposition: A | Discharge status: Home / Self Care | Payer: Medicare Other | Attending: Emergency Medicine | Admitting: Emergency Medicine

## 2016-02-18 ENCOUNTER — Encounter (HOSPITAL_COMMUNITY): Payer: Self-pay | Admitting: Emergency Medicine

## 2016-02-18 DIAGNOSIS — Z992 Dependence on renal dialysis: Secondary | ICD-10-CM | POA: Diagnosis not present

## 2016-02-18 DIAGNOSIS — N189 Chronic kidney disease, unspecified: Secondary | ICD-10-CM

## 2016-02-18 DIAGNOSIS — N186 End stage renal disease: Secondary | ICD-10-CM | POA: Insufficient documentation

## 2016-02-18 DIAGNOSIS — F17210 Nicotine dependence, cigarettes, uncomplicated: Secondary | ICD-10-CM | POA: Insufficient documentation

## 2016-02-18 LAB — I-STAT CHEM 8, ED
BUN: 32 mg/dL — AB (ref 6–20)
CREATININE: 9.9 mg/dL — AB (ref 0.61–1.24)
Calcium, Ion: 1.15 mmol/L (ref 1.12–1.23)
Chloride: 100 mmol/L — ABNORMAL LOW (ref 101–111)
Glucose, Bld: 94 mg/dL (ref 65–99)
HEMATOCRIT: 41 % (ref 39.0–52.0)
Hemoglobin: 13.9 g/dL (ref 13.0–17.0)
POTASSIUM: 4.4 mmol/L (ref 3.5–5.1)
Sodium: 137 mmol/L (ref 135–145)
TCO2: 26 mmol/L (ref 0–100)

## 2016-02-18 NOTE — Progress Notes (Signed)
02/18/2016 10:52 AM Hemodialysis Outpatient Note; I have been searching for an area dialysis unit that would accept Willie Andrade without much success, however I have just learned that the [facility] on [address] would give him a Monday,Wednesday and Friday schedule. Willie Andrade does have a problem with transportation. The only suggestion I/we (sw and cm) could come up with would have Willie Andrade use" PART" of [place] to bring him to [place] then he would have to transfer to" LINK" of [place] to get to [address]. The patient refused this idea. I'm not sure what else I can do. Thank you.Tilman Neat

## 2016-02-18 NOTE — ED Provider Notes (Signed)
CSN: [id number]     Arrival date & time 02/18/16  0541 History   First MD Initiated Contact with Patient 02/18/16 0606     Chief Complaint  Patient presents with  . Dialysis     The history is provided by the patient.  Patient with h/o ESRD here for dialysis Apparently, he is due for dialysis today He just moved to area and doesn't have local dialysis center He was asked to return to ER today for dialysis He denies cp/sob No fever/vomiting He otherwise feels at baseline He has no other complaints at this time  Past Medical History  Diagnosis Date  . End stage renal disease (HCC)   . ESRD (end stage renal disease) on dialysis [facility])    History reviewed. No pertinent past surgical history. No family history on file. Social History  Substance Use Topics  . Smoking status: Current Every Day Smoker -- 0.00 packs/day    Types: Cigarettes  . Smokeless tobacco: None  . Alcohol Use: No    Review of Systems  Constitutional: Negative for fever.  Cardiovascular: Negative for chest pain.      Allergies  Review of patient's allergies indicates no known allergies.  Home Medications   Prior to Admission medications   Medication Sig Start Date End Date Taking? Authorizing Provider  cinacalcet (SENSIPAR) 30 MG tablet Take 30 mg by mouth daily.    Historical Provider, MD  hydrOXYzine (ATARAX/VISTARIL) 50 MG tablet Take 50 mg by mouth every 8 (eight) hours as needed for itching.  12/25/15   Historical Provider, MD  oxyCODONE-acetaminophen (PERCOCET) 10-325 MG tablet Take 1 tablet by mouth every 8 (eight) hours as needed for pain.    Historical Provider, MD  temazepam (RESTORIL) 30 MG capsule Take 30 mg by mouth at bedtime. 01/19/16   Historical Provider, MD   BP 106/72 mmHg  Pulse 95  Temp(Src) 97.7 F (36.5 C) (Oral)  Resp 17  Ht 6\' 2"  (1.88 m)  Wt 69.117 kg  BMI 19.56 kg/m2  SpO2 99% Physical Exam CONSTITUTIONAL: Chronically ill appearing HEAD: Normocephalic/atraumatic EYES:  EOMI ENMT: Mucous membranes moist NECK: supple no meningeal signs CV: S1/S2 noted LUNGS: Lungs are clear to auscultation bilaterally, no crackles noted ABDOMEN: soft, nontender NEURO: Pt is awake/alert/appropriate, moves all extremitiesx4.  EXTREMITIES: dialysis access to left UE SKIN: warm, color normal PSYCH: no abnormalities of mood noted, alert and oriented to situation  ED Course  Procedures  Labs Review Labs Reviewed  I-STAT CHEM 8, ED - Abnormal; Notable for the following:    Chloride 100 (*)    BUN 32 (*)    Creatinine, Ser 9.90 (*)    All other components within normal limits     I have personally reviewed and evaluated these  lab results as part of my medical decision-making.   EKG Interpretation   Date/Time:  Friday February 18 2016 36:14:43 EDT Ventricular Rate:  86 PR Interval:    QRS Duration: 168 QT Interval:  421 QTC Calculation: 504 R Axis:   -145 Text Interpretation:  Ventricular-paced rhythm No significant change since  last tracing Confirmed by Bebe Shaggy  MD, Taura Lamarre ([id number]) on 02/18/2016  6:32:19 AM     Pt will be going straight to dialysis  MDM   Final diagnoses:  Chronic renal failure, unspecified stage    Nursing notes including past medical history and social history reviewed and considered in documentation .dwwwla     Zadie Rhine, MD 02/18/16 [phone]

## 2016-02-18 NOTE — Progress Notes (Signed)
Mr. Willie Andrade completed his 4 hours of hemodialysis today. He had episodes of low BP's during HD but was asymptomatic the entire treatment. Seen and examined by Dr. Kathrene Bongo. He was discharged A/Ox4, and ambulatory. Verbalized that he is going to be picked up by his relative/ cousin. Standing BP: 124/71 mm Hg

## 2016-02-18 NOTE — ED Notes (Signed)
EDP at bedside  

## 2016-02-18 NOTE — ED Notes (Signed)
Pt in requesting dialysis, MWF

## 2016-02-18 NOTE — Progress Notes (Signed)
I have now confirmed by speaking with the Medical Director that patient will be accepted at the [facility] to start there on Tuesday 7/25.  Plans will be finalized and patient will be made aware.  It is possible if we cannot reach him that he will come to ER here on Monday- if he does, check labs to make sure does not need acute HD and we will need to let him know to go to OP unit on Tuesday.   Patsye Sullivant A

## 2016-02-21 ENCOUNTER — Emergency Department (HOSPITAL_COMMUNITY)
Admission: EM | Admit: 2016-02-21 | Discharge: 2016-02-21 | Disposition: A | Discharge status: Home / Self Care | Payer: Medicare Other | Attending: Emergency Medicine | Admitting: Emergency Medicine

## 2016-02-21 ENCOUNTER — Encounter (HOSPITAL_COMMUNITY): Payer: Self-pay | Admitting: Emergency Medicine

## 2016-02-21 DIAGNOSIS — N186 End stage renal disease: Secondary | ICD-10-CM | POA: Insufficient documentation

## 2016-02-21 DIAGNOSIS — Z992 Dependence on renal dialysis: Secondary | ICD-10-CM | POA: Insufficient documentation

## 2016-02-21 DIAGNOSIS — F17210 Nicotine dependence, cigarettes, uncomplicated: Secondary | ICD-10-CM | POA: Diagnosis not present

## 2016-02-21 LAB — I-STAT CHEM 8, ED
BUN: 38 mg/dL — AB (ref 6–20)
CALCIUM ION: 1.04 mmol/L — AB (ref 1.12–1.23)
CREATININE: 11.1 mg/dL — AB (ref 0.61–1.24)
Chloride: 101 mmol/L (ref 101–111)
Glucose, Bld: 82 mg/dL (ref 65–99)
HCT: 41 % (ref 39.0–52.0)
Hemoglobin: 13.9 g/dL (ref 13.0–17.0)
Potassium: 4.5 mmol/L (ref 3.5–5.1)
Sodium: 139 mmol/L (ref 135–145)
TCO2: 25 mmol/L (ref 0–100)

## 2016-02-21 NOTE — ED Triage Notes (Signed)
Pt. is here for his routine hemodialysis , last treatment Friday last week , no complaints /respirations unlabored.

## 2016-02-21 NOTE — ED Notes (Signed)
Pt here for dialysis, just recently moved here and does not have dialysis center.

## 2016-02-21 NOTE — Progress Notes (Signed)
02/21/2016 2:26 PM Hemodialysis Outpatient Note; I have spoken to Willie Andrade and explained to him his schedule at the [facility], TTS starting tomorrow at t12:00 pm. He said he understood and took down the address and phone number to the center. Thank you. Tilman Neat.

## 2016-02-21 NOTE — ED Provider Notes (Signed)
[facility]-EMERGENCY DEPT Provider Note   CSN: [id number] Arrival date & time: 02/21/16  6004  First Provider Contact:  None       History   Chief Complaint No chief complaint on file.   HPI Willie Andrade is a 65 y.o. male.  HPI PT with hx of ESRD comes in for HD. Pt new to the city and is in the process of establishing care. His last HD session was Friday. Pt denies any dib, chest pains. He is here for his regular HD. Notes from Renal team indicate that patient was found a place in [facility]. PT wasn't made aware of the potential set up. He reports that he will need help with transportation.  Past Medical History:  Diagnosis Date  . End stage renal disease (HCC)   . ESRD (end stage renal disease) on dialysis [facility])     Patient Active Problem List   Diagnosis Date Noted  . ESRD (end stage renal disease) (HCC) 02/14/2016    History reviewed. No pertinent surgical history.     Home Medications    Prior to Admission medications   Medication Sig Start Date End Date Taking? Authorizing Provider  cinacalcet (SENSIPAR) 30 MG tablet Take 30 mg by mouth daily.   Yes Historical Provider, MD  hydrOXYzine (ATARAX/VISTARIL) 50 MG tablet Take 50 mg by mouth every 8 (eight) hours as needed for itching.  12/25/15  Yes Historical Provider, MD  oxyCODONE-acetaminophen (PERCOCET) 10-325 MG tablet Take 1 tablet by mouth every 8 (eight) hours as needed for pain.   Yes Historical Provider, MD  temazepam (RESTORIL) 30 MG capsule Take 30 mg by mouth at bedtime. 01/19/16  Yes Historical Provider, MD    Family History No family history on file.  Social History Social History  Substance Use Topics  . Smoking status: Current Every Day Smoker    Packs/day: 0.00    Types: Cigarettes  . Smokeless tobacco: Not on file  . Alcohol use No     Allergies   Review of patient's allergies indicates no known allergies.   Review of Systems Review of Systems  Constitutional: Negative for  activity change.  Respiratory: Negative for shortness of breath.   Cardiovascular: Negative for chest pain and palpitations.     Physical Exam Updated Vital Signs BP 132/85 (BP Location: Right Arm)   Pulse 88   Temp 97.6 F (36.4 C) (Oral)   Resp 15   SpO2 100%   Physical Exam  Constitutional: He is oriented to person, place, and time. He appears well-developed.  HENT:  Head: Atraumatic.  Neck: Neck supple.  Cardiovascular: Normal rate.   Pulmonary/Chest: Effort normal.  Neurological: He is alert and oriented to person, place, and time.  Skin: Skin is warm.  Nursing note and vitals reviewed.    ED Treatments / Results  Labs (all labs ordered are listed, but only abnormal results are displayed) Labs Reviewed  I-STAT CHEM 8, ED - Abnormal; Notable for the following:       Result Value   BUN 38 (*)    Creatinine, Ser 11.10 (*)    Calcium, Ion 1.04 (*)    All other components within normal limits    EKG  EKG Interpretation None       Radiology No results found.  Procedures Procedures (including critical care time)  Medications Ordered in ED Medications - No data to display   Initial Impression / Assessment and Plan / ED Course  I have reviewed the triage  vital signs and the nursing notes.  Pertinent labs & imaging results that were available during my care of the patient were reviewed by me and considered in my medical decision making (see chart for details).  Clinical Course   Pt has no respiratory distress and exam not consistent with pulm edema. Labs show no hyperkalemia. Case/SW consulted to help set up transportation.    Final Clinical Impressions(s) / ED Diagnoses   Final diagnoses:  ESRD (end stage renal disease) on dialysis [facility])    New Prescriptions New Prescriptions   No medications on file     Derwood Kaplan, MD 02/21/16 [phone]

## 2016-02-21 NOTE — Progress Notes (Signed)
02/21/2016 9:04 AM Hemodialysis Outpatient Note; I attempted to reach the ED nurse taking care of Mr. Adamowicz but was unable to make a connection. Please have Mr. Desorbo arrive for his HD treatment at the [facility] on [address] [address], tel # [phone] at 12:00 noon tomorrow the 25th. Mr. Laessig does not have a personal telephone listed. Thank you. Tilman Neat

## 2016-02-21 NOTE — Progress Notes (Addendum)
THIS PT HAS BEEN ACCEPTED BY [facility] ED CM noted CM consult for pt  Last call to this pt by this CM made on 02/15/16 when CM encouraged pt to contact Jeanette at [facility] on 02/15/16 Pt was given the contact number as [phone] 02/21/16 0910 ED CM called [facility] and spoke with Diane who confirms pt has been accepted pending a dialysis bed pending transportation arrangements Will have Jeanette to call ED CM mobile # for update Confirms pt called [facility] on 02/15/16 per their records Per Darel Hong. Diane again reminded CM that [facility] has a policy that the pt needs to call into them, they are not able to reach out to pt  0917 ED CM updated Dr Anitra Lauth, EDP that pt needs to call [facility] and pt was given [facility] contact information on 02/15/16 along with transportation resources via [employer] [facility] on 02/11/16 Pt needs encouragement to be compliant and to follow up with services available to him. No further ED CM interventions  Entered in d/c intructions  [facility]     1 [phone] Jeanette contact person     Next Steps: Call on 02/21/2016    [facility] [place]     [address] [place], [place], [place] [address] [phone]    Next Steps: Call on 02/21/2016    [employer] [facility]     [phone]     Next Steps: Call on 02/21/2016    [place] DSS transportation-[place] [employer] [facility] [employer] [facility]     [address] [place], [place] [address] [phone] or [phone]    Next Steps: Call on 02/21/2016    [employer]     [employer] ([employer]) provides rides for individuals of any age who have disabilities and need transportation in [place]. Call [phone] to discuss eligibility and to arrange transportation    Next Steps: Call on 02/21/2016

## 2016-02-24 ENCOUNTER — Telehealth: Payer: Self-pay | Admitting: *Deleted

## 2016-03-27 ENCOUNTER — Ambulatory Visit: Payer: Medicare Other | Admitting: Podiatry

## 2016-04-26 ENCOUNTER — Ambulatory Visit: Payer: Medicare Other | Admitting: Podiatry

## 2016-08-11 ENCOUNTER — Encounter (HOSPITAL_COMMUNITY): Payer: Self-pay | Admitting: Emergency Medicine

## 2016-08-11 ENCOUNTER — Emergency Department (HOSPITAL_COMMUNITY)
Admission: EM | Admit: 2016-08-11 | Discharge: 2016-08-12 | Disposition: A | Discharge status: Home / Self Care | Payer: Medicare Other | Attending: Emergency Medicine | Admitting: Emergency Medicine

## 2016-08-11 DIAGNOSIS — N186 End stage renal disease: Secondary | ICD-10-CM | POA: Insufficient documentation

## 2016-08-11 DIAGNOSIS — F17210 Nicotine dependence, cigarettes, uncomplicated: Secondary | ICD-10-CM | POA: Diagnosis not present

## 2016-08-11 DIAGNOSIS — R4182 Altered mental status, unspecified: Secondary | ICD-10-CM | POA: Diagnosis not present

## 2016-08-11 DIAGNOSIS — Z992 Dependence on renal dialysis: Secondary | ICD-10-CM | POA: Diagnosis not present

## 2016-08-11 MED ORDER — SODIUM CHLORIDE 0.9 % IV BOLUS (SEPSIS)
1000.0000 mL | Freq: Once | INTRAVENOUS | Status: AC
  Administered 2016-08-12: 1000 mL via INTRAVENOUS

## 2016-08-11 NOTE — ED Triage Notes (Signed)
Patient had dialysis yesterday, has been altered since yesterday. Patient is alert to his name and where he is.  He has been incontinent of stool and unable to put his clothes on.  He has not been himself since dialysis yesterday.

## 2016-08-12 ENCOUNTER — Emergency Department (HOSPITAL_COMMUNITY): Payer: Medicare Other

## 2016-08-12 LAB — COMPREHENSIVE METABOLIC PANEL
ALBUMIN: 3.2 g/dL — AB (ref 3.5–5.0)
ALK PHOS: 80 U/L (ref 38–126)
ALT: 11 U/L — AB (ref 17–63)
AST: 18 U/L (ref 15–41)
Anion gap: 18 — ABNORMAL HIGH (ref 5–15)
BUN: 44 mg/dL — ABNORMAL HIGH (ref 6–20)
CALCIUM: 9.9 mg/dL (ref 8.9–10.3)
CHLORIDE: 97 mmol/L — AB (ref 101–111)
CO2: 28 mmol/L (ref 22–32)
CREATININE: 11.77 mg/dL — AB (ref 0.61–1.24)
GFR calc Af Amer: 5 mL/min — ABNORMAL LOW (ref >60)
GFR calc non Af Amer: 4 mL/min — ABNORMAL LOW (ref >60)
GLUCOSE: 84 mg/dL (ref 65–99)
Potassium: 4.6 mmol/L (ref 3.5–5.1)
SODIUM: 143 mmol/L (ref 135–145)
Total Bilirubin: 0.8 mg/dL (ref 0.3–1.2)
Total Protein: 6.9 g/dL (ref 6.5–8.1)

## 2016-08-12 LAB — CBC WITH DIFFERENTIAL/PLATELET
BASOS ABS: 0 10*3/uL (ref 0.0–0.1)
BASOS PCT: 0 %
EOS ABS: 0 10*3/uL (ref 0.0–0.7)
Eosinophils Relative: 0 %
HCT: 37.4 % — ABNORMAL LOW (ref 39.0–52.0)
HEMOGLOBIN: 11.5 g/dL — AB (ref 13.0–17.0)
Lymphocytes Relative: 16 %
Lymphs Abs: 1.1 10*3/uL (ref 0.7–4.0)
MCH: 26.7 pg (ref 26.0–34.0)
MCHC: 30.7 g/dL (ref 30.0–36.0)
MCV: 87 fL (ref 78.0–100.0)
MONOS PCT: 8 %
Monocytes Absolute: 0.5 10*3/uL (ref 0.1–1.0)
NEUTROS PCT: 76 %
Neutro Abs: 5.5 10*3/uL (ref 1.7–7.7)
Platelets: 183 10*3/uL (ref 150–400)
RBC: 4.3 MIL/uL (ref 4.22–5.81)
RDW: 17.4 % — AB (ref 11.5–15.5)
WBC: 7.2 10*3/uL (ref 4.0–10.5)

## 2016-08-12 LAB — I-STAT CHEM 8, ED
BUN: 60 mg/dL — ABNORMAL HIGH (ref 6–20)
CHLORIDE: 99 mmol/L — AB (ref 101–111)
Calcium, Ion: 1 mmol/L — ABNORMAL LOW (ref 1.15–1.40)
Creatinine, Ser: 12.1 mg/dL — ABNORMAL HIGH (ref 0.61–1.24)
Glucose, Bld: 80 mg/dL (ref 65–99)
HEMATOCRIT: 36 % — AB (ref 39.0–52.0)
HEMOGLOBIN: 12.2 g/dL — AB (ref 13.0–17.0)
Potassium: 6.2 mmol/L — ABNORMAL HIGH (ref 3.5–5.1)
SODIUM: 138 mmol/L (ref 135–145)
TCO2: 30 mmol/L (ref 0–100)

## 2016-08-12 LAB — ETHANOL

## 2016-08-12 LAB — AMMONIA: AMMONIA: 28 umol/L (ref 9–35)

## 2016-08-12 MED ORDER — INSULIN ASPART 100 UNIT/ML IV SOLN: 10.0000 [IU] | Freq: Once | INTRAVENOUS | Status: DC

## 2016-08-12 MED ORDER — DEXTROSE 50 % IV SOLN
1.0000 | Freq: Once | INTRAVENOUS | Status: AC
  Administered 2016-08-12: 50 mL via INTRAVENOUS
  Filled 2016-08-12: qty 50

## 2016-08-12 MED ORDER — INSULIN ASPART 100 UNIT/ML ~~LOC~~ SOLN
10.0000 [IU] | Freq: Once | SUBCUTANEOUS | Status: AC
  Administered 2016-08-12: 10 [IU] via INTRAVENOUS
  Filled 2016-08-12: qty 1

## 2016-08-12 MED ORDER — SODIUM CHLORIDE 0.9 % IV SOLN
1.0000 g | Freq: Once | INTRAVENOUS | Status: AC
  Administered 2016-08-12: 1 g via INTRAVENOUS
  Filled 2016-08-12: qty 10

## 2016-08-12 NOTE — ED Notes (Signed)
Patient Alert and oriented X4. Stable and ambulatory. Patient verbalized understanding of the discharge instructions.  Patient belongings were taken by the patient.  

## 2016-08-12 NOTE — ED Provider Notes (Signed)
[facility]PT Provider Note   CSN: [id number] Arrival date & time: 08/11/16  2252  By signing my name below, I, Willie Andrade, attest that this documentation has been prepared under the direction and in the presence of Melene Plan, DO. Electronically Signed: Soijett Andrade, ED Scribe. 08/12/16. 12:22 AM.  History   Chief Complaint Chief Complaint  Patient presents with  . Altered Mental Status    HPI Willie Andrade is a 66 y.o. male with a PMHx of ESRD on dialysis, who presents to the Emergency Department complaining of AMS onset yesterday. Pt last dialysis session was yesterday with sessions every Tuesday, Thursday, and Saturday through a fistula. Family notes that the pt went to the restroom and was found nude on the couch in fecal matter. Family states that the pt was disoriented and unaware of how to put on clothes. Family reports that the pt was last at his baseline on Thursday prior to dialysis. Family notes the pt is having associated symptoms of confusion, bowel incontinence, and cough. He he has not tried any medications for the relief of his symptoms. Family denies the pt having fever, diarrhea, abdominal pain, vomiting, head injury, weakness, and any other symptoms. Denies illegal drug use.    The history is provided by the patient and a relative. No language interpreter was used.  Altered Mental Status      Past Medical History:  Diagnosis Date  . End stage renal disease (HCC)   . ESRD (end stage renal disease) on dialysis [facility])     Patient Active Problem List   Diagnosis Date Noted  . ESRD (end stage renal disease) (HCC) 02/14/2016    History reviewed. No pertinent surgical history.     Home Medications    Prior to Admission medications   Medication Sig Start Date End Date Taking? Authorizing Provider  gabapentin (NEURONTIN) 100 MG capsule Take 100 mg by mouth 2 (two) times daily. 06/01/16  Yes Historical Provider, MD  Iron-Vitamin C (VITRON-C PO) Take 1  tablet by mouth daily. 12/04/07  Yes Historical Provider, MD  midodrine (PROAMATINE) 5 MG tablet Take 5 mg by mouth 2 (two) times daily. 06/29/16  Yes Historical Provider, MD    Family History History reviewed. No pertinent family history.  Social History Social History  Substance Use Topics  . Smoking status: Current Every Day Smoker    Packs/day: 0.00    Types: Cigarettes  . Smokeless tobacco: Never Used  . Alcohol use No     Allergies   Patient has no known allergies.   Review of Systems Review of Systems  Unable to perform ROS: Mental status change     Physical Exam Updated Vital Signs BP 110/72   Pulse 80   Temp 98.8 F (37.1 C) (Oral)   Resp 19   SpO2 98%   Physical Exam  Constitutional: He is oriented to person, place, and time. He appears well-developed and well-nourished.  Appears dehydrated  HENT:  Head: Normocephalic and atraumatic.  Eyes: EOM are normal. Pupils are equal, round, and reactive to light.  Neck: Normal range of motion. Neck supple. No JVD present.  Cardiovascular: Normal rate and regular rhythm.  Exam reveals no gallop and no friction rub.   No murmur heard. Pulmonary/Chest: No respiratory distress. He has no wheezes.  Abdominal: Soft. He exhibits no distension. There is no tenderness. There is no rebound and no guarding.  Musculoskeletal: Normal range of motion.  4/5 strength to left arm and 5/5 strength to  right arm. Decreased left arm strength is contributed to shoulder pain.  Neurological: He is alert and oriented to person, place, and time.  No facial droop  Skin: No rash noted. No pallor.  Psychiatric: He has a normal mood and affect. His behavior is normal.  Nursing note and vitals reviewed.   ED Treatments / Results  DIAGNOSTIC STUDIES: Oxygen Saturation is 94% on RA, adequate by my interpretation.    COORDINATION OF CARE: 12:16 AM Discussed treatment plan with pt family at bedside which includes labs, UA, EKG, CT head, and  pt family agreed to plan.   Labs (all labs ordered are listed, but only abnormal results are displayed) Labs Reviewed  COMPREHENSIVE METABOLIC PANEL - Abnormal; Notable for the following:       Result Value   Chloride 97 (*)    BUN 44 (*)    Creatinine, Ser 11.77 (*)    Albumin 3.2 (*)    ALT 11 (*)    GFR calc non Af Amer 4 (*)    GFR calc Af Amer 5 (*)    Anion gap 18 (*)    All other components within normal limits  CBC WITH DIFFERENTIAL/PLATELET - Abnormal; Notable for the following:    Hemoglobin 11.5 (*)    HCT 37.4 (*)    RDW 17.4 (*)    All other components within normal limits  I-STAT CHEM 8, ED - Abnormal; Notable for the following:    Potassium 6.2 (*)    Chloride 99 (*)    BUN 60 (*)    Creatinine, Ser 12.10 (*)    Calcium, Ion 1.00 (*)    Hemoglobin 12.2 (*)    HCT 36.0 (*)    All other components within normal limits  CULTURE, BLOOD (ROUTINE X 2)  CULTURE, BLOOD (ROUTINE X 2)  AMMONIA  ETHANOL  I-STAT CG4 LACTIC ACID, ED  CBG MONITORING, ED    EKG  EKG Interpretation  Date/Time:  Friday August 11 2016 23:29:53 EST Ventricular Rate:  78 PR Interval:    QRS Duration: 175 QT Interval:  458 QTC Calculation: 522 R Axis:   -96 Text Interpretation:  Sinus rhythm Supraventricular bigeminy Nonspecific IVCD with LAD Anterolateral infarct, age indeterminate Confirmed by Ethelda Chick  MD, SAM [phone]) on 08/11/2016 11:36:17 PM       Radiology Dg Chest 2 View  Result Date: 08/12/2016 CLINICAL DATA:  Cough and altered mental status EXAM: CHEST  2 VIEW COMPARISON:  None. FINDINGS: Right chest wall AICD and leads in the expected locations at the right ventricle, right atrium and coronary sinus. There is atherosclerotic calcification in the aortic arch. Cardiomediastinal silhouette is enlarged. There are increased opacities in the right lower lobe. No pneumothorax or pleural effusion. Mild interstitial opacities without overt pulmonary edema. IMPRESSION: 1. Enlarged  cardiomediastinal silhouette indicating pericardial effusion versus cardiomegaly alone. Correlate with echocardiography. 2. Increased opacity in the right lower lobe may indicate developing consolidation, including the possibility of pneumonia. 3. Aortic atherosclerosis. Electronically Signed   By: Deatra Robinson M.D.   On: 08/12/2016 04:04   Ct Head Wo Contrast  Result Date: 08/12/2016 CLINICAL DATA:  Altered mental status EXAM: CT HEAD WITHOUT CONTRAST TECHNIQUE: Contiguous axial images were obtained from the base of the skull through the vertex without intravenous contrast. COMPARISON:  None. FINDINGS: Brain: No mass lesion, intraparenchymal hemorrhage or extra-axial collection. No evidence of acute cortical infarct. Brain parenchyma and CSF-containing spaces are normal for age. Vascular: Atherosclerotic calcification of the vertebral  and internal carotid arteries at the skull base. Skull: Normal visualized skull base, calvarium and extracranial soft tissues. Sinuses/Orbits: No sinus fluid levels or advanced mucosal thickening. No mastoid effusion. Normal orbits. IMPRESSION: 1. No acute intracranial abnormality. 2. Vertebral and internal carotid artery atherosclerosis. Electronically Signed   By: Deatra Robinson M.D.   On: 08/12/2016 02:53    Procedures Procedures (including critical care time) Procedure note: Ultrasound Guided Peripheral IV Ultrasound guided peripheral 1.88 inch angiocath IV placement performed by me. Indications: Nursing unable to place IV. Details: The antecubital fossa and upper arm were evaluated with a multifrequency linear probe. Patent brachial veins were noted. 1 attempt was made to cannulate a vein under realtime US guidance with successful cannulation of the vein and catheter placement. There is return of non-pulsatile dark red blood. The patient tolerated the procedure well without complications. Images archived electronically.  CPT codes: 11914 and [phone]  Medications  Ordered in ED Medications  sodium chloride 0.9 % bolus 1,000 mL (0 mLs Intravenous Stopped 08/12/16 0538)  dextrose 50 % solution 50 mL (50 mLs Intravenous Given 08/12/16 0401)  calcium gluconate 1 g in sodium chloride 0.9 % 100 mL IVPB (0 g Intravenous Stopped 08/12/16 0420)  insulin aspart (novoLOG) injection 10 Units (10 Units Intravenous Given 08/12/16 0400)     Initial Impression / Assessment and Plan / ED Course  I have reviewed the triage vital signs and the nursing notes.  Pertinent labs & imaging results that were available during my care of the patient were reviewed by me and considered in my medical decision making (see chart for details).  Clinical Course     66 yo with a cc of AMS.  Occurred after dialysis a couple days ago.  Has been having some significant coughing. Will obtain labs, ct head, cxr.   Workup unremarkable.  Patient back at baseline.  D/c home.   6:26 AM:  I have discussed the diagnosis/risks/treatment options with the patient and believe the pt to be eligible for discharge home to follow-up with PCP. We also discussed returning to the ED immediately if new or worsening sx occur. We discussed the sx which are most concerning (e.g., sudden worsening pain, fever, inability to tolerate by mouth) that necessitate immediate return. Medications administered to the patient during their visit and any new prescriptions provided to the patient are listed below.  Medications given during this visit Medications  sodium chloride 0.9 % bolus 1,000 mL (0 mLs Intravenous Stopped 08/12/16 0538)  dextrose 50 % solution 50 mL (50 mLs Intravenous Given 08/12/16 0401)  calcium gluconate 1 g in sodium chloride 0.9 % 100 mL IVPB (0 g Intravenous Stopped 08/12/16 0420)  insulin aspart (novoLOG) injection 10 Units (10 Units Intravenous Given 08/12/16 0400)     The patient appears reasonably screen and/or stabilized for discharge and I doubt any other medical condition or other [facility]  requiring further screening, evaluation, or treatment in the ED at this time prior to discharge.    Final Clinical Impressions(s) / ED Diagnoses   Final diagnoses:  Altered mental status, unspecified altered mental status type    New Prescriptions Discharge Medication List as of 08/12/2016  4:49 AM     I personally performed the services described in this documentation, which was scribed in my presence. The recorded information has been reviewed and is accurate.      Melene Plan, DO 08/12/16 [phone]

## 2016-08-17 LAB — CULTURE, BLOOD (ROUTINE X 2)
CULTURE: NO GROWTH
CULTURE: NO GROWTH

## 2016-08-30 ENCOUNTER — Emergency Department (HOSPITAL_COMMUNITY): Payer: Medicare Other

## 2016-08-30 ENCOUNTER — Encounter (HOSPITAL_COMMUNITY): Payer: Self-pay | Admitting: *Deleted

## 2016-08-30 ENCOUNTER — Emergency Department (HOSPITAL_COMMUNITY)
Admission: EM | Admit: 2016-08-30 | Discharge: 2016-08-30 | Disposition: A | Discharge status: Home / Self Care | Payer: Medicare Other | Attending: Emergency Medicine | Admitting: Emergency Medicine

## 2016-08-30 DIAGNOSIS — R0602 Shortness of breath: Secondary | ICD-10-CM | POA: Diagnosis present

## 2016-08-30 DIAGNOSIS — F17210 Nicotine dependence, cigarettes, uncomplicated: Secondary | ICD-10-CM | POA: Diagnosis not present

## 2016-08-30 DIAGNOSIS — N186 End stage renal disease: Secondary | ICD-10-CM | POA: Insufficient documentation

## 2016-08-30 DIAGNOSIS — J40 Bronchitis, not specified as acute or chronic: Secondary | ICD-10-CM | POA: Insufficient documentation

## 2016-08-30 DIAGNOSIS — Z992 Dependence on renal dialysis: Secondary | ICD-10-CM | POA: Diagnosis not present

## 2016-08-30 DIAGNOSIS — M25512 Pain in left shoulder: Secondary | ICD-10-CM | POA: Insufficient documentation

## 2016-08-30 HISTORY — DX: Unspecified viral hepatitis B without hepatic coma: B19.10

## 2016-08-30 HISTORY — DX: Tobacco use: Z72.0

## 2016-08-30 LAB — CBC WITH DIFFERENTIAL/PLATELET
BASOS PCT: 0 %
Basophils Absolute: 0 10*3/uL (ref 0.0–0.1)
EOS ABS: 0.1 10*3/uL (ref 0.0–0.7)
EOS PCT: 1 %
HCT: 39.1 % (ref 39.0–52.0)
HEMOGLOBIN: 11.8 g/dL — AB (ref 13.0–17.0)
LYMPHS ABS: 1.4 10*3/uL (ref 0.7–4.0)
Lymphocytes Relative: 25 %
MCH: 26.5 pg (ref 26.0–34.0)
MCHC: 30.2 g/dL (ref 30.0–36.0)
MCV: 87.7 fL (ref 78.0–100.0)
MONOS PCT: 9 %
Monocytes Absolute: 0.5 10*3/uL (ref 0.1–1.0)
NEUTROS PCT: 65 %
Neutro Abs: 3.4 10*3/uL (ref 1.7–7.7)
PLATELETS: 158 10*3/uL (ref 150–400)
RBC: 4.46 MIL/uL (ref 4.22–5.81)
RDW: 17.6 % — ABNORMAL HIGH (ref 11.5–15.5)
WBC: 5.3 10*3/uL (ref 4.0–10.5)

## 2016-08-30 LAB — BASIC METABOLIC PANEL
Anion gap: 15 (ref 5–15)
BUN: 26 mg/dL — AB (ref 6–20)
CALCIUM: 10 mg/dL (ref 8.9–10.3)
CHLORIDE: 92 mmol/L — AB (ref 101–111)
CO2: 32 mmol/L (ref 22–32)
Creatinine, Ser: 7.98 mg/dL — ABNORMAL HIGH (ref 0.61–1.24)
GFR calc non Af Amer: 6 mL/min — ABNORMAL LOW (ref >60)
GFR, EST AFRICAN AMERICAN: 7 mL/min — AB (ref >60)
Glucose, Bld: 90 mg/dL (ref 65–99)
Potassium: 4.6 mmol/L (ref 3.5–5.1)
SODIUM: 139 mmol/L (ref 135–145)

## 2016-08-30 MED ORDER — METHOCARBAMOL 500 MG PO TABS: 500.0000 mg | ORAL_TABLET | Freq: Every evening | ORAL | 0 refills | Status: DC | PRN

## 2016-08-30 MED ORDER — PREDNISONE 20 MG PO TABS: 40.0000 mg | ORAL_TABLET | Freq: Every day | ORAL | 0 refills | Status: DC

## 2016-08-30 MED ORDER — PREDNISONE 20 MG PO TABS
60.0000 mg | ORAL_TABLET | Freq: Once | ORAL | Status: AC
  Administered 2016-08-30: 60 mg via ORAL
  Filled 2016-08-30: qty 3

## 2016-08-30 MED ORDER — METHOCARBAMOL 500 MG PO TABS
500.0000 mg | ORAL_TABLET | Freq: Once | ORAL | Status: AC
  Administered 2016-08-30: 500 mg via ORAL
  Filled 2016-08-30: qty 1

## 2016-08-30 MED ORDER — ACETAMINOPHEN 325 MG PO TABS
650.0000 mg | ORAL_TABLET | Freq: Once | ORAL | Status: AC
  Administered 2016-08-30: 650 mg via ORAL
  Filled 2016-08-30: qty 2

## 2016-08-30 NOTE — ED Provider Notes (Signed)
[facility] Provider Note   CSN: [id number] Arrival date & time: 08/30/16  1734     History   Chief Complaint Chief Complaint  Patient presents with  . Shortness of Breath    HPI Willie Andrade is a 66 y.o. male who presents with SOB. PMH significant for ESRD on dialysis (goes T/T/S), tobacco use, Hep B. He reports he was unable to complete his session yesterday due to L shoulder pain. He missed about 1 hour of dialysis. Been taking Percocet with minimal relief which he states he got from a doctor in [place] for foot pain. He states he hurt his shoulder several months ago after falling when getting on a bus. He has never had it evaluated before. The pain is worse with movement of the left shoulder. The pain goes in the shoulder to the elbow. Denies pain in his fistula. He states the pain became worse yesterday while he was at dialysis and he had to cut the session short by one hour.   He also states he has been short of breath for the past 2 weeks. He has been coughing which is worse at night. Denies fever, chills, URI symptoms, chest pain, leg swelling. He went to his primary care doctor's office today who noted he was wheezing and gave him a breathing treatment. He is unsure if it helped or not with his SOB. Denies history of CAD, Asthma/COPD. He is a smoker. He states he is from [place] and he moved in with his sister here in [place].  HPI  Past Medical History:  Diagnosis Date  . ESRD (end stage renal disease) on dialysis (HCC)   . Hepatitis B   . Tobacco abuse     Patient Active Problem List   Diagnosis Date Noted  . ESRD (end stage renal disease) (HCC) 02/14/2016    History reviewed. No pertinent surgical history.     Home Medications    Prior to Admission medications   Medication Sig Start Date End Date Taking? Authorizing Provider  calcium acetate (PHOSLO) 667 MG capsule Take 2,001 mg by mouth 3 (three) times daily with meals.   Yes Historical Provider, MD    gabapentin (NEURONTIN) 100 MG capsule Take 100 mg by mouth 2 (two) times daily. 06/01/16  Yes Historical Provider, MD  lidocaine-prilocaine (EMLA) cream Apply 1 application topically Every Tuesday,Thursday,and Saturday with dialysis.   Yes Historical Provider, MD  cinacalcet (SENSIPAR) 30 MG tablet Take 30 mg by mouth daily.    Historical Provider, MD  Iron-Vitamin C (VITRON-C PO) Take 1 tablet by mouth daily. 12/04/07   Historical Provider, MD  methocarbamol (ROBAXIN) 500 MG tablet Take 1 tablet (500 mg total) by mouth at bedtime and may repeat dose one time if needed. 08/30/16   Bethel Born, PA-C  midodrine (PROAMATINE) 5 MG tablet Take 5 mg by mouth 2 (two) times daily. 06/29/16   Historical Provider, MD  predniSONE (DELTASONE) 20 MG tablet Take 2 tablets (40 mg total) by mouth daily. 08/30/16   Bethel Born, PA-C    Family History History reviewed. No pertinent family history.  Social History Social History  Substance Use Topics  . Smoking status: Current Every Day Smoker    Packs/day: 0.00    Types: Cigarettes  . Smokeless tobacco: Never Used  . Alcohol use No     Allergies   Patient has no known allergies.   Review of Systems Review of Systems  Constitutional: Negative for chills and fever.  HENT:  Negative for congestion, ear pain, rhinorrhea and sore throat.   Respiratory: Positive for cough, shortness of breath and wheezing.   Cardiovascular: Negative for chest pain, palpitations and leg swelling.  Gastrointestinal: Negative for abdominal pain, nausea and vomiting.  Musculoskeletal: Positive for arthralgias. Negative for joint swelling.  Neurological: Negative for weakness and numbness.  All other systems reviewed and are negative.    Physical Exam Updated Vital Signs BP 122/78   Pulse 95   Temp 97.4 F (36.3 C) (Oral)   Resp 18   SpO2 99%   Physical Exam  Constitutional: He is oriented to person, place, and time. He appears well-developed and  well-nourished. No distress.  HENT:  Head: Normocephalic and atraumatic.  Right Ear: Hearing, tympanic membrane, external ear and ear canal normal.  Left Ear: Hearing, tympanic membrane, external ear and ear canal normal.  Nose: Nose normal.  Mouth/Throat: Uvula is midline, oropharynx is clear and moist and mucous membranes are normal.  Eyes: Conjunctivae are normal. Pupils are equal, round, and reactive to light. Right eye exhibits no discharge. Left eye exhibits no discharge. No scleral icterus.  Neck: Normal range of motion.  Cardiovascular: Normal rate and regular rhythm.  Exam reveals no gallop and no friction rub.   No murmur heard. Left upper extremity is non-tender and has palpable thrill  Pulmonary/Chest: Effort normal. No respiratory distress. He has wheezes (Scattered mild wheezes). He has no rales. He exhibits no tenderness.  Abdominal: Soft. Bowel sounds are normal. He exhibits no distension. There is no tenderness.  Musculoskeletal:  Left shoulder: No obvious swelling or deformity.Tenderness to palpation of the left trapezius, anterior shoulder, FROM. N/V intact.   Neurological: He is alert and oriented to person, place, and time.  Skin: Skin is warm and dry.  Dry skin  Psychiatric: He has a normal mood and affect. His behavior is normal.  Nursing note and vitals reviewed.    ED Treatments / Results  Labs (all labs ordered are listed, but only abnormal results are displayed) Labs Reviewed  BASIC METABOLIC PANEL - Abnormal; Notable for the following:       Result Value   Chloride 92 (*)    BUN 26 (*)    Creatinine, Ser 7.98 (*)    GFR calc non Af Amer 6 (*)    GFR calc Af Amer 7 (*)    All other components within normal limits  CBC WITH DIFFERENTIAL/PLATELET - Abnormal; Notable for the following:    Hemoglobin 11.8 (*)    RDW 17.6 (*)    All other components within normal limits  I-STAT TROPOININ, ED    EKG  EKG Interpretation  Date/Time:  Wednesday August 30 2016 18:45:42 EST Ventricular Rate:  92 PR Interval:    QRS Duration: 182 QT Interval:  458 QTC Calculation: 567 R Axis:   -115 Text Interpretation:  Sinus rhythm Nonspecific IVCD with LAD Probable lateral infarct, age indeterminate Anterior infarct, old No significant change since last tracing Confirmed by LITTLE MD, RACHEL [phone]) on 08/30/2016 9:56:37 PM       Radiology Dg Chest 2 View  Result Date: 08/30/2016 CLINICAL DATA:  Worsening shortness of breath.  Dialysis patient. EXAM: CHEST  2 VIEW COMPARISON:  08/12/2016 FINDINGS: Chronic enlargement of the cardiac silhouette. Pacemaker/ AICD appears unchanged. Chronic aortic atherosclerosis. The lungs are well aerated. Prominent interstitial markings which could be chronic interstitial lung disease orally fluid overload. No advanced edema. No effusions. No acute bone finding. IMPRESSION: Chronic enlargement of  the cardiac silhouette and aortic atherosclerosis. Increased interstitial lung markings which could be chronic fibrosis or could represent mild interstitial edema/ fluid overload. Electronically Signed   By: Paulina Fusi M.D.   On: 08/30/2016 19:15   Dg Shoulder Left  Result Date: 08/30/2016 CLINICAL DATA:  Initial evaluation for acute left shoulder pain status post fall 3 months ago. EXAM: LEFT SHOULDER - 2+ VIEW COMPARISON:  None. FINDINGS: No acute fracture or dislocation. No evidence for subacute injury. Humeral head in normal alignment with the glenoid. AC joint approximated. No periarticular calcification. Vascular calcifications noted within the left axilla. Partially visualized left hemithorax is clear. IMPRESSION: No acute or subacute injury identified about the left shoulder. Electronically Signed   By: Rise Mu M.D.   On: 08/30/2016 19:28    Procedures Procedures (including critical care time)  Medications Ordered in ED Medications  acetaminophen (TYLENOL) tablet 650 mg (650 mg Oral Given 08/30/16 2317)    methocarbamol (ROBAXIN) tablet 500 mg (500 mg Oral Given 08/30/16 2317)  predniSONE (DELTASONE) tablet 60 mg (60 mg Oral Given 08/30/16 2340)    Initial Impression / Assessment and Plan / ED Course  I have reviewed the triage vital signs and the nursing notes.  Pertinent labs & imaging results that were available during my care of the patient were reviewed by me and considered in my medical decision making (see chart for details).  66 year old male presents with chronic left shoulder pain for months and shortness of breath for 2 weeks likely caused by bronchitis vs mild volume overload. Patient is afebrile, not tachycardic or tachypneic, normotensive, and not hypoxic. No respiratory distress or tachypnea. CBC remarkable for mild anemia. BMP remarkable for elevated BUN/SCr which is improved from baseline. CXR shows increased interstitial lung markings which show chronic fibrosis vs interstitial edema. EKG does not show any acute changes. Troponin was ordered through order set but was not run by the lab. Shared visit with Dr. Clarene Duke. Do not feel his symptoms are consistent with ACS. Will not redraw.  He is mostly concerned with his shoulder pain. Tylenol and Robaxin given. Shoulder xray is negative. Do not feel stronger pain medicine is indicated since his pain has been ongoing for months although he is upset about this. Ortho follow up recommended.   Prednisone given for bronchitis. Rx for steroid burst given as well as muscle relaxer. He is also encouraged to go to entire dialysis session tomorrow and follow up with PCP. Return precautions given.   Final Clinical Impressions(s) / ED Diagnoses   Final diagnoses:  Bronchitis  Acute pain of left shoulder    New Prescriptions Discharge Medication List as of 08/30/2016 11:46 PM    START taking these medications   Details  methocarbamol (ROBAXIN) 500 MG tablet Take 1 tablet (500 mg total) by mouth at bedtime and may repeat dose one time if  needed., Starting Wed 08/30/2016, Print    predniSONE (DELTASONE) 20 MG tablet Take 2 tablets (40 mg total) by mouth daily., Starting Wed 08/30/2016, Print         Bethel Born, PA-C 08/31/16 0008    Laurence Spates, MD 09/04/16 [phone]

## 2016-08-30 NOTE — ED Notes (Signed)
Transported to xray 

## 2016-08-30 NOTE — ED Triage Notes (Signed)
Pt to ED via [facility], from MDs office for eval of increased sob. Pt had partial dialysis yesterday. States he couldn't take the entire treatment due to left shoulder pain. Pt has a hx of left shoulder pain past falling while getting on a bus a couple of months ago. Pt states the pain has just in the past few days become very painful. Mae x4 freely. Pt did have a neb at MDs office pta due to sob. Now speaking in full clear sentences.

## 2016-08-30 NOTE — Discharge Instructions (Signed)
Please follow up with dialysis tomorrow Take steroid for the next 4 days Return for worsening symptoms

## 2016-10-03 ENCOUNTER — Encounter: Payer: Self-pay | Admitting: Interventional Cardiology

## 2016-10-10 DIAGNOSIS — Z950 Presence of cardiac pacemaker: Secondary | ICD-10-CM | POA: Insufficient documentation

## 2016-10-11 ENCOUNTER — Encounter: Payer: Self-pay | Admitting: Interventional Cardiology

## 2016-10-11 ENCOUNTER — Ambulatory Visit (INDEPENDENT_AMBULATORY_CARE_PROVIDER_SITE_OTHER): Payer: Medicare Other | Admitting: Interventional Cardiology

## 2016-10-11 ENCOUNTER — Encounter (INDEPENDENT_AMBULATORY_CARE_PROVIDER_SITE_OTHER): Payer: Self-pay

## 2016-10-11 VITALS — BP 100/62 | HR 93 | Ht 74.0 in | Wt 155.8 lb

## 2016-10-11 DIAGNOSIS — I5022 Chronic systolic (congestive) heart failure: Secondary | ICD-10-CM | POA: Diagnosis not present

## 2016-10-11 DIAGNOSIS — Z95810 Presence of automatic (implantable) cardiac defibrillator: Secondary | ICD-10-CM

## 2016-10-11 DIAGNOSIS — N186 End stage renal disease: Secondary | ICD-10-CM

## 2016-10-11 DIAGNOSIS — I130 Hypertensive heart and chronic kidney disease with heart failure and stage 1 through stage 4 chronic kidney disease, or unspecified chronic kidney disease: Secondary | ICD-10-CM | POA: Insufficient documentation

## 2016-10-11 DIAGNOSIS — I132 Hypertensive heart and chronic kidney disease with heart failure and with stage 5 chronic kidney disease, or end stage renal disease: Secondary | ICD-10-CM

## 2016-10-11 NOTE — Progress Notes (Signed)
Cardiology Office Note    Date:  10/11/2016   ID:  Willie Andrade, DOB Mar 08, 1951, MRN [id number]  PCP:  Geraldo Pitter, MD  Cardiologist: Lesleigh Noe, MD   Chief Complaint  Patient presents with  . Congestive Heart Failure     History of Present Illness:  Willie Andrade is a 66 y.o. male who is here to establish for cardiology follow-up. He appears to have systolic heart failure/cardiomyopathy, and AICD. No old records are available and he is unable to give significant history. He does not have a device card. 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 He continues to smoke cigarettes. He no longer smokes "weed".  The patient is recently moved back to [place] from [place]. There is not much detail concerning his underlying cardiovascular problems. He is referred by Dr. Ocie Bob to establish for cardiology follow-up. No specific cardiac date is noted on the records with the exception of a "history of pacemaker, stent, dialysis, alcoholism, and smoking".  The patient denies chest discomfort. He denies orthopnea. He has dialysis 3 times per week. Has had no recent episodes of severe low blood pressure during dialysis. Used to smoke weed but does not do that anymore. Denies orthopnea and PND. No lower extremity swelling. As all he had one access site over the time frame that he has been on dialysis. He cannot remember how long he's been on dialysis. States his cardiac situation is due to a "damaged" heart. He does not know if this is due to blocked arteries or other concerns. He feels he has a pacemaker but tells me that he has received one shock from the device in the past.  Past Medical History:  Diagnosis Date  . ESRD (end stage renal disease) on dialysis (HCC)   . Hepatitis B    . Tobacco abuse     No past surgical history on file.  Current Medications: Outpatient Medications Prior to Visit  Medication Sig Dispense Refill  . calcium acetate (PHOSLO) 667 MG capsule Take 2,001 mg by mouth 3 (three) times daily with meals.    . cinacalcet (SENSIPAR) 30 MG tablet Take 30 mg by mouth daily.    Marland Kitchen gabapentin (NEURONTIN) 100 MG capsule Take 100 mg by mouth 2 (two) times daily.  3  . Iron-Vitamin C (VITRON-C PO) Take 1 tablet by mouth daily.    Marland Kitchen lidocaine-prilocaine (EMLA) cream Apply 1 application topically Every Tuesday,Thursday,and Saturday with dialysis.    Marland Kitchen methocarbamol (ROBAXIN) 500 MG tablet Take 1 tablet (500 mg total) by mouth at bedtime and may repeat dose one time if needed. 10 tablet 0  . midodrine (PROAMATINE) 5 MG tablet Take 5 mg by mouth 2 (two) times daily.  6  . predniSONE (DELTASONE) 20 MG tablet Take 2 tablets (40 mg total) by mouth daily. 8 tablet 0   No facility-administered medications prior to visit.      Allergies:   Patient has no known allergies.   Social History   Social History  . Marital status: Legally Separated    Spouse name: N/A  . Number of children: N/A  . Years of education: N/A   Social History Main Topics  . Smoking status: Current Every Day Smoker    Packs/day: 0.00    Types: Cigarettes  . Smokeless tobacco: Never Used  .  Alcohol use No  . Drug use: No  . Sexual activity: Not Asked   Other Topics Concern  . None   Social History Narrative  . None     Family History:  The patient's family history is not on file. He is unable to give any significant detail concerning his family history of vascular disease.  ROS:   Please see the history of present illness.    Smoke cigarettes. Is on dialysis and dependent upon others to transport him. He has gout intermittently. Has constipation, difficulty urinating, back pain, vision disturbance. All other systems reviewed and are negative.   PHYSICAL EXAM:   VS:  BP  100/62 (BP Location: Right Arm)   Pulse 93   Ht 6\' 2"  (1.88 m)   Wt 155 lb 12.8 oz (70.7 kg)   BMI 20.00 kg/m    GEN: Well nourished, well developed, in no acute distress. Slender. Appears older than his stated age.  HEENT: normal  Neck: no JVD, carotid bruits, or masses Cardiac: RRR. He has a gallop that is consistent with an S3. There is moderate apical systolic murmur of mitral regurgitation. There is no edema  Respiratory:  clear to auscultation bilaterally, normal work of breathing GI: soft, nontender, nondistended, + BS MS: no deformity or atrophy  Skin: warm and dry, no rash Neuro:  Alert and Oriented x 3, Strength and sensation are intact Psych: euthymic mood, full affect  Wt Readings from Last 3 Encounters:  10/11/16 155 lb 12.8 oz (70.7 kg)  02/18/16 152 lb 5.4 oz (69.1 kg)  02/16/16 151 lb 10.8 oz (68.8 kg)      Studies/Labs Reviewed:   EKG:  EKG  Not performed in our office today we do have a copy of the electrocardiogram performed at Dr. Tedra Senegal office on 08/30/16 which demonstrates atrial tracking with ventricular pacing. No prior tracings are available for comparison.  Recent Labs: 08/12/2016: ALT 11 08/30/2016: BUN 26; Creatinine, Ser 7.98; Hemoglobin 11.8; Platelets 158; Potassium 4.6; Sodium 139   Lipid Panel No results found for: CHOL, TRIG, HDL, CHOLHDL, VLDL, LDLCALC, LDLDIRECT  Additional studies/ records that were reviewed today include:   Recent chest x-ray of 08/30/16: Personally reviewed IMPRESSION: Chronic enlargement of the cardiac silhouette and aortic atherosclerosis.  Increased interstitial lung markings which could be chronic fibrosis or could represent mild interstitial edema/ fluid overload.   ASSESSMENT:    1. 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 ESRD (end stage renal disease) (HCC)   3. Chronic systolic heart failure (HCC)   4. Presence of biventricular AICD      PLAN:    In order of problems listed above:  1. Though there is no specific documentation to verify this diagnosis, this is my best clinical opinion of this time. He seems to have both kidney and heart failure related to uncontrolled hypertension over the years. I am not convinced he has coronary disease. We will obtain information from [facility] to get complete documentation of his clinical cardiovascular problem. No change in therapy at this time. 2. Apparently followed by the [facility] and receives dialysis on Tuesday Thursday and Saturday. No recent difficulty. Access site is in the left forearm. 3. No evidence of volume overload on today's exam. The presence of an S3 gallop suggests a dilated cardiomyopathy in the setting prior history of hypertension and kidney failure. There is no evidence of volume overload. An echocardiogram is  ordered to document LV function for future reference in our system. 4. Even though the patient states he has a "pacemaker", my suspicion is that he hasn't AICD. I reviewed her recent chest x-rays from January 2018 which also seem to support this. We have no information about his device type. He will be referred to the device clinic for follow-up.  Overall plan is to obtain information concerning his cardiac history from [facility]. He has signed a release of information forms today. Clinical follow-up in 6-8 weeks. Earlier if problems. Appointment in the device clinic is also recommended.  Medication Adjustments/Labs and Tests Ordered: Current medicines are reviewed at length with the patient today.  Concerns regarding medicines are outlined above.  Medication changes, Labs and Tests ordered today are listed in the Patient Instructions below. Patient Instructions  Medication Instructions:  None  Labwork: None  Testing/Procedures: Your physician has requested that you have an echocardiogram. Echocardiography is a painless  test that uses sound waves to create images of your heart. It provides your doctor with information about the size and shape of your heart and how well your heart's chambers and valves are working. This procedure takes approximately one hour. There are no restrictions for this procedure.    Follow-Up: Your physician recommends that you schedule a follow-up appointment next available with [facility] Device [facility] as a new patient.  Your physician wants you to follow-up in: 6 months with Dr. Katrinka Blazing.  You will receive a reminder letter in the mail two months in advance. If you don't receive a letter, please call our office to schedule the follow-up appointment.    Any Other Special Instructions Will Be Listed Below (If Applicable).     If you need a refill on your cardiac medications before your next appointment, please call your pharmacy.      Signed, Lesleigh Noe, MD  10/11/2016 1:42 PM    [facility] [address] [place], [place], [place]  [address] Phone: [phone]; Fax: [phone]

## 2016-10-11 NOTE — Patient Instructions (Addendum)
Medication Instructions:  None  Labwork: None  Testing/Procedures: Your physician has requested that you have an echocardiogram. Echocardiography is a painless test that uses sound waves to create images of your heart. It provides your doctor with information about the size and shape of your heart and how well your heart's chambers and valves are working. This procedure takes approximately one hour. There are no restrictions for this procedure.    Follow-Up: Your physician recommends that you schedule a follow-up appointment next available with [facility] as a new patient.  Your physician wants you to follow-up in: 6 months with Dr. Katrinka Blazing.  You will receive a reminder letter in the mail two months in advance. If you don't receive a letter, please call our office to schedule the follow-up appointment.    Any Other Special Instructions Will Be Listed Below (If Applicable).     If you need a refill on your cardiac medications before your next appointment, please call your pharmacy.

## 2016-10-27 ENCOUNTER — Other Ambulatory Visit: Payer: Self-pay

## 2016-10-27 ENCOUNTER — Ambulatory Visit (HOSPITAL_COMMUNITY): Payer: Medicare Other | Attending: Cardiology

## 2016-10-27 DIAGNOSIS — I358 Other nonrheumatic aortic valve disorders: Secondary | ICD-10-CM | POA: Diagnosis not present

## 2016-10-27 DIAGNOSIS — Z720 Tobacco use: Secondary | ICD-10-CM | POA: Insufficient documentation

## 2016-10-27 DIAGNOSIS — I5021 Acute systolic (congestive) heart failure: Secondary | ICD-10-CM | POA: Diagnosis present

## 2016-10-27 DIAGNOSIS — Z992 Dependence on renal dialysis: Secondary | ICD-10-CM | POA: Diagnosis not present

## 2016-10-27 DIAGNOSIS — I361 Nonrheumatic tricuspid (valve) insufficiency: Secondary | ICD-10-CM | POA: Diagnosis not present

## 2016-10-27 DIAGNOSIS — B1910 Unspecified viral hepatitis B without hepatic coma: Secondary | ICD-10-CM | POA: Diagnosis not present

## 2016-10-27 DIAGNOSIS — I5022 Chronic systolic (congestive) heart failure: Secondary | ICD-10-CM | POA: Diagnosis not present

## 2016-10-27 DIAGNOSIS — N186 End stage renal disease: Secondary | ICD-10-CM | POA: Diagnosis not present

## 2016-10-27 DIAGNOSIS — E785 Hyperlipidemia, unspecified: Secondary | ICD-10-CM | POA: Insufficient documentation

## 2016-10-27 DIAGNOSIS — I132 Hypertensive heart and chronic kidney disease with heart failure and with stage 5 chronic kidney disease, or end stage renal disease: Secondary | ICD-10-CM | POA: Diagnosis not present

## 2016-10-30 ENCOUNTER — Encounter: Payer: Medicare Other | Admitting: Cardiology

## 2016-10-30 NOTE — Progress Notes (Deleted)
Electrophysiology Office Note   Date:  10/30/2016   ID:  Willie Andrade, DOB [birth year], MRN [id number]  PCP:  Geraldo Pitter, MD  Cardiologist:  Katrinka Blazing Primary Electrophysiologist:  Libbey Duce Jorja Loa, MD    No chief complaint on file.    History of Present Illness: Willie Andrade is a 66 y.o. male who presents today for electrophysiology evaluation.   Willie Andrade is a 66 y.o. male who is being seen today for the evaluation of CHF at the request of [name], MD. He has a history of systolic heart failure with an ICD in place. He also has end-stage renal disease and is on dialysis, prior heavy alcohol, hyperlipidemia, hypertension. It appears that his hypertension is what contributed to both his renal failure and his heart disease. Oriented to the patient, he is received 1 ICD shock in the past.    Today, he denies*** symptoms of palpitations, chest pain, shortness of breath, orthopnea, PND, lower extremity edema, claudication, dizziness, presyncope, syncope, bleeding, or neurologic sequela. The patient is tolerating medications without difficulties and is otherwise without complaint today.    Past Medical History:  Diagnosis Date  . ESRD (end stage renal disease) on dialysis (HCC)   . Hepatitis B   . Tobacco abuse    No past surgical history on file.   Current Outpatient Prescriptions  Medication Sig Dispense Refill  . calcium acetate (PHOSLO) 667 MG capsule Take 2,001 mg by mouth 3 (three) times daily with meals.    . cinacalcet (SENSIPAR) 30 MG tablet Take 30 mg by mouth daily.    Marland Kitchen gabapentin (NEURONTIN) 100 MG capsule Take 100 mg by mouth 2 (two) times daily.  3  . Iron-Vitamin C (VITRON-C PO) Take 1 tablet by mouth daily.    Marland Kitchen lidocaine-prilocaine (EMLA) cream Apply 1 application topically Every Tuesday,Thursday,and Saturday with dialysis.    Marland Kitchen methocarbamol (ROBAXIN) 500 MG tablet Take 1 tablet (500 mg total) by mouth at bedtime and may repeat dose one time if  needed. 10 tablet 0  . midodrine (PROAMATINE) 5 MG tablet Take 5 mg by mouth 2 (two) times daily.  6  . predniSONE (DELTASONE) 20 MG tablet Take 2 tablets (40 mg total) by mouth daily. 8 tablet 0   No current facility-administered medications for this visit.     Allergies:   Patient has no known allergies.   Social History:  The patient  reports that he has been smoking Cigarettes.  He has been smoking about 0.00 packs per day. He has never used smokeless tobacco. He reports that he does not drink alcohol or use drugs.   Family History:  The patient's ***family history is not on file.    ROS:  Please see the history of present illness.   Otherwise, review of systems is positive for ***.   All other systems are reviewed and negative.    PHYSICAL EXAM: VS:  There were no vitals taken for this visit. , BMI There is no height or weight on file to calculate BMI. GEN: Well nourished, well developed, in no acute distress  HEENT: normal  Neck: no JVD, carotid bruits, or masses Cardiac: ***RRR; no murmurs, rubs, or gallops,no edema  Respiratory:  clear to auscultation bilaterally, normal work of breathing GI: soft, nontender, nondistended, + BS MS: no deformity or atrophy  Skin: warm and dry, *** device pocket is well healed Neuro:  Strength and sensation are intact Psych: euthymic mood, full affect  EKG:  EKG is not  ordered today. Personal review of the ekg ordered 1/31/18shows A sense, V pace  Device interrogation is reviewed today in detail.  See PaceArt for details.   Recent Labs: 08/12/2016: ALT 11 08/30/2016: BUN 26; Creatinine, Ser 7.98; Hemoglobin 11.8; Platelets 158; Potassium 4.6; Sodium 139    Lipid Panel  No results found for: CHOL, TRIG, HDL, CHOLHDL, VLDL, LDLCALC, LDLDIRECT   Wt Readings from Last 3 Encounters:  10/11/16 155 lb 12.8 oz (70.7 kg)  02/18/16 152 lb 5.4 oz (69.1 kg)  02/16/16 151 lb 10.8 oz (68.8 kg)      Other studies Reviewed: Additional  studies/ records that were reviewed today include: TTE 10/27/16  Review of the above records today demonstrates:  - Left ventricle: The cavity size was moderately dilated. Wall   thickness was normal. Systolic function was severely reduced. The   estimated ejection fraction was in the range of 15% to 20%.   Diffuse hypokinesis. Doppler parameters are consistent with a   reversible restrictive pattern, indicative of decreased left   ventricular diastolic compliance and/or increased left atrial   pressure (grade 3 diastolic dysfunction). - Ventricular septum: Septal motion showed abnormal function and   dyssynergy. - Aortic valve: Trileaflet; mildly thickened, mildly calcified   leaflets. - Left atrium: The atrium was severely dilated. Volume/bsa, ES   (1-plane Simpson&'s, A4C): 49.2 ml/m^2. - Right ventricle: The cavity size was moderately dilated. Wall   thickness was normal. Systolic function was mildly to moderately   reduced. - Tricuspid valve: There was moderate regurgitation.   ASSESSMENT AND PLAN:  1.  Chronic systolic heart failure: s/p right sided CRT-D.***  2. Hypertension: ***  3. ESRD: dialysis is 2 days per week.  Current medicines are reviewed at length with the patient today.   The patient {ACTIONS; HAS/DOES NOT HAVE:19233} concerns regarding his medicines.  The following changes were made today:  {NONE DEFAULTED:18576::"none"}  Labs/ tests ordered today include: *** No orders of the defined types were placed in this encounter.    Disposition:   FU with *** {gen number [id number]} {Days to years:10300}  Signed, Aniceto Kyser Jorja Loa, MD  10/30/2016 1:45 PM     [facility] [address] [place] [address] [phone] (office) [phone] (fax)

## 2016-11-13 ENCOUNTER — Other Ambulatory Visit: Payer: Self-pay | Admitting: Cardiology

## 2016-11-13 ENCOUNTER — Encounter: Payer: Self-pay | Admitting: Cardiology

## 2016-11-13 ENCOUNTER — Ambulatory Visit (INDEPENDENT_AMBULATORY_CARE_PROVIDER_SITE_OTHER): Payer: Medicare Other | Admitting: Cardiology

## 2016-11-13 ENCOUNTER — Encounter: Payer: Self-pay | Admitting: *Deleted

## 2016-11-13 VITALS — BP 108/64 | HR 89 | Ht 74.0 in | Wt 158.8 lb

## 2016-11-13 DIAGNOSIS — I5022 Chronic systolic (congestive) heart failure: Secondary | ICD-10-CM

## 2016-11-13 DIAGNOSIS — I472 Ventricular tachycardia, unspecified: Secondary | ICD-10-CM

## 2016-11-13 NOTE — Patient Instructions (Addendum)
Medication Instructions:    Your physician recommends that you continue on your current medications as directed. Please refer to the Current Medication list given to you today.  --- If you need a refill on your cardiac medications before your next appointment, please call your pharmacy. ---  Labwork:  None ordered  Testing/Procedures:  None ordered  Follow-Up:  Your physician recommends that you schedule a follow-up appointment in: 10-14 days, after your procedure on 11/28/16, with device clinic for a wound check.   Your physician recommends that you schedule a follow-up appointment in: 3 months, after your procedure on 11/28/16, with Dr. Elberta Fortis.   Thank you for choosing [facility]!!   Dory Horn, RN [phone]

## 2016-11-13 NOTE — Progress Notes (Signed)
Electrophysiology Office Note   Date:  11/13/2016   ID:  Willie Andrade, DOB 09/15/1950, MRN [id number]  PCP:  Geraldo Pitter, MD  Cardiologist:  Katrinka Blazing Primary Electrophysiologist:  Riti Rollyson Jorja Loa, MD    Chief Complaint  Patient presents with  . Defib Check     History of Present Illness: Willie Andrade is a 66 y.o. male who is being seen today for the evaluation of systolic heart failure s/p ICD at the request of [name], MD. Presenting today for electrophysiology evaluation. 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 He continues to smoke cigarettes. Recently moved to [place] from [place].    Today, he denies symptoms of palpitations, chest pain, shortness of breath, orthopnea, PND, lower extremity edema, claudication, dizziness, presyncope, syncope, bleeding, or neurologic sequela. The patient is tolerating medications without difficulties. He did have an episode of VF which was terminated by [facility] in January of this year. He has had short episodes of nonsustained VT that have not required therapy.   Past Medical History:  Diagnosis Date  . ESRD (end stage renal disease) on dialysis (HCC)   . Hepatitis B   . Tobacco abuse    History reviewed. No pertinent surgical history.   Current Outpatient Prescriptions  Medication Sig Dispense Refill  . calcium acetate (PHOSLO) 667 MG capsule Take 2,001 mg by mouth 3 (three) times daily with meals.    . cinacalcet (SENSIPAR) 30 MG tablet Take 30 mg by mouth daily.    Marland Kitchen gabapentin (NEURONTIN) 100 MG capsule Take 100 mg by mouth 2 (two) times daily.  3  . Iron-Vitamin C (VITRON-C PO) Take 1 tablet by mouth daily.    Marland Kitchen lidocaine-prilocaine (EMLA) cream Apply 1 application topically Every Tuesday,Thursday,and Saturday with dialysis.    Marland Kitchen methocarbamol (ROBAXIN) 500 MG tablet Take 1 tablet (500 mg  total) by mouth at bedtime and may repeat dose one time if needed. 10 tablet 0  . midodrine (PROAMATINE) 5 MG tablet Take 5 mg by mouth 2 (two) times daily.  6  . predniSONE (DELTASONE) 20 MG tablet Take 2 tablets (40 mg total) by mouth daily. 8 tablet 0   No current facility-administered medications for this visit.     Allergies:   Patient has no known allergies.   Social History:  The patient  reports that he has been smoking Cigarettes.  He has been smoking about 0.00 packs per day. He has never used smokeless tobacco. He reports that he does not drink alcohol or use drugs.   Family History:  The patient's family history includes Asthma in his brother; Diabetes in his mother; Gout in his maternal grandmother, mother, and sister; Stroke in his father.    ROS:  Please see the history of present illness.   Otherwise, review of systems is positive for none.   All other systems are reviewed and negative.    PHYSICAL EXAM: VS:  BP 108/64   Pulse 89   Ht 6\' 2"  (1.88 m)   Wt 158 lb 12.8 oz (72 kg)   BMI 20.39 kg/m  , BMI Body mass index is 20.39 kg/m. GEN: Well nourished, well developed, in no acute distress  HEENT: normal  Neck: no JVD, carotid bruits, or masses Cardiac: RRR; no murmurs, rubs, or gallops,no edema  Respiratory:  clear to auscultation bilaterally, normal work of breathing GI: soft, nontender, nondistended, + BS MS: no  deformity or atrophy  Skin: warm and dry, device pocket is well healed Neuro:  Strength and sensation are intact Psych: euthymic mood, full affect  EKG:  EKG is not ordered today. Personal review of the ekg ordered 08/30/16 shows sinus rhythm, V pacing   Device interrogation is reviewed today in detail.  See PaceArt for details.   Recent Labs: 08/12/2016: ALT 11 08/30/2016: BUN 26; Creatinine, Ser 7.98; Hemoglobin 11.8; Platelets 158; Potassium 4.6; Sodium 139    Lipid Panel  No results found for: CHOL, TRIG, HDL, CHOLHDL, VLDL, LDLCALC,  LDLDIRECT   Wt Readings from Last 3 Encounters:  11/13/16 158 lb 12.8 oz (72 kg)  10/11/16 155 lb 12.8 oz (70.7 kg)  02/18/16 152 lb 5.4 oz (69.1 kg)      Other studies Reviewed: Additional studies/ records that were reviewed today include: TTE 10/27/16  Review of the above records today demonstrates:  - Left ventricle: The cavity size was moderately dilated. Wall   thickness was normal. Systolic function was severely reduced. The   estimated ejection fraction was in the range of 15% to 20%.   Diffuse hypokinesis. Doppler parameters are consistent with a   reversible restrictive pattern, indicative of decreased left   ventricular diastolic compliance and/or increased left atrial   pressure (grade 3 diastolic dysfunction). - Ventricular septum: Septal motion showed abnormal function and   dyssynergy. - Aortic valve: Trileaflet; mildly thickened, mildly calcified   leaflets. - Left atrium: The atrium was severely dilated. Volume/bsa, ES   (1-plane Simpson&'s, A4C): 49.2 ml/m^2. - Right ventricle: The cavity size was moderately dilated. Wall   thickness was normal. Systolic function was mildly to moderately   reduced. - Tricuspid valve: There was moderate regurgitation.   ASSESSMENT AND PLAN:  1.  Chronic systolic heart failure: s/p CRT-D. Device is currently at [facility]. We'll plan for generator change. Risks and benefits were discussed. Risks include bleeding and infection. Of note his P waves on his right atrial lead have decreased. We may need to add or revise that lead.   2. Hypertension: Well-controlled  3. ESRD: on HD  4. Tobacco abuse: cessation encouraged  5. Ventricular tachycardia: Last episode of treated ventricular tachycardia was in January that fell into the VF zone. Was treated with ATP. He has had other episodes of nonsustained ventricular tachycardia not requiring therapy. I told him not to drive for 6 months after his episode in January. If he has further episodes  of VT, we'll plan to start amiodarone.  Current medicines are reviewed at length with the patient today.   The patient does not have concerns regarding his medicines.  The following changes were made today:  none  Labs/ tests ordered today include:  No orders of the defined types were placed in this encounter.    Disposition:   FU with Zhion Pevehouse 3 months  Signed, Shubham Thackston Jorja Loa, MD  11/13/2016 1:46 PM     [facility] [address] [place] [address] [phone] (office) [phone] (fax)

## 2016-11-17 ENCOUNTER — Telehealth: Payer: Self-pay | Admitting: *Deleted

## 2016-11-17 DIAGNOSIS — T82120S Displacement of cardiac electrode, sequela: Secondary | ICD-10-CM

## 2016-11-17 LAB — CUP PACEART INCLINIC DEVICE CHECK
HIGH POWER IMPEDANCE MEASURED VALUE: 50.7046
Implantable Lead Implant Date: 20101123
Implantable Lead Location: 753860
Lead Channel Impedance Value: 437.5 Ohm
Lead Channel Pacing Threshold Pulse Width: 0.4 ms
Lead Channel Pacing Threshold Pulse Width: 0.5 ms
Lead Channel Sensing Intrinsic Amplitude: 0.9 mV
Lead Channel Sensing Intrinsic Amplitude: 10.9 mV
Lead Channel Setting Pacing Amplitude: 2 V
Lead Channel Setting Pacing Amplitude: 2 V
Lead Channel Setting Pacing Pulse Width: 0.4 ms
Lead Channel Setting Pacing Pulse Width: 0.5 ms
MDC IDC LEAD IMPLANT DT: 20101123
MDC IDC LEAD IMPLANT DT: 20101123
MDC IDC LEAD LOCATION: 753858
MDC IDC LEAD LOCATION: 753859
MDC IDC MSMT LEADCHNL LV IMPEDANCE VALUE: 575 Ohm
MDC IDC MSMT LEADCHNL LV PACING THRESHOLD AMPLITUDE: 2 V
MDC IDC MSMT LEADCHNL RA IMPEDANCE VALUE: 375 Ohm
MDC IDC MSMT LEADCHNL RA PACING THRESHOLD AMPLITUDE: 0.5 V
MDC IDC MSMT LEADCHNL RA PACING THRESHOLD PULSEWIDTH: 0.5 ms
MDC IDC MSMT LEADCHNL RV PACING THRESHOLD AMPLITUDE: 0.75 V
MDC IDC PG IMPLANT DT: 20101123
MDC IDC PG SERIAL: 742499
MDC IDC SESS DTM: 20180416155035
MDC IDC SET LEADCHNL LV PACING AMPLITUDE: 2.5 V
MDC IDC SET LEADCHNL RV SENSING SENSITIVITY: 0.5 mV
MDC IDC STAT BRADY RA PERCENT PACED: 2.9 %
MDC IDC STAT BRADY RV PERCENT PACED: 99 %

## 2016-11-17 NOTE — Telephone Encounter (Signed)
Advised pt needs CXR to evaluate RA lead before generator change. Patient verbalized understanding and agreeable to plan.  He understands office will call to arrange CXR.

## 2016-11-17 NOTE — Telephone Encounter (Signed)
-----   Message from Will Jorja Loa, MD sent at 11/17/2016  3:45 PM EDT ----- Normal interrogation reviewed. Battery and lead parameters stable. Needs CXR RA lead

## 2016-11-23 ENCOUNTER — Telehealth: Payer: Self-pay | Admitting: Cardiology

## 2016-11-23 NOTE — Telephone Encounter (Signed)
Advised pt to stop by [facility] today/tomorrow for CXR prior to generator change next week. Pt states he will stop by there tomorrow for CXR.

## 2016-11-23 NOTE — Telephone Encounter (Signed)
New Message    Pt is calling to schedule the chest xray before his procedure Tuesday

## 2016-11-27 ENCOUNTER — Ambulatory Visit (HOSPITAL_COMMUNITY)
Admission: RE | Admit: 2016-11-27 | Discharge: 2016-11-27 | Disposition: A | Discharge status: Home / Self Care | Payer: Medicare Other | Source: Ambulatory Visit | Attending: Cardiology | Admitting: Cardiology

## 2016-11-27 DIAGNOSIS — T82120S Displacement of cardiac electrode, sequela: Secondary | ICD-10-CM

## 2016-11-27 DIAGNOSIS — X58XXXS Exposure to other specified factors, sequela: Secondary | ICD-10-CM | POA: Diagnosis not present

## 2016-11-27 DIAGNOSIS — Z95810 Presence of automatic (implantable) cardiac defibrillator: Secondary | ICD-10-CM | POA: Insufficient documentation

## 2016-11-27 DIAGNOSIS — J449 Chronic obstructive pulmonary disease, unspecified: Secondary | ICD-10-CM | POA: Diagnosis not present

## 2016-11-28 ENCOUNTER — Ambulatory Visit (HOSPITAL_COMMUNITY)
Admission: RE | Admit: 2016-11-28 | Discharge: 2016-11-28 | Disposition: A | Discharge status: Home / Self Care | Payer: Medicare Other | Source: Ambulatory Visit | Attending: Cardiology | Admitting: Cardiology

## 2016-11-28 ENCOUNTER — Ambulatory Visit (HOSPITAL_COMMUNITY)
Admission: RE | Disposition: A | Discharge status: Home / Self Care | Payer: Self-pay | Source: Ambulatory Visit | Attending: Cardiology

## 2016-11-28 DIAGNOSIS — Z4502 Encounter for adjustment and management of automatic implantable cardiac defibrillator: Secondary | ICD-10-CM | POA: Diagnosis not present

## 2016-11-28 DIAGNOSIS — I2510 Atherosclerotic heart disease of native coronary artery without angina pectoris: Secondary | ICD-10-CM | POA: Diagnosis not present

## 2016-11-28 DIAGNOSIS — Z95818 Presence of other cardiac implants and grafts: Secondary | ICD-10-CM

## 2016-11-28 DIAGNOSIS — I5022 Chronic systolic (congestive) heart failure: Secondary | ICD-10-CM | POA: Diagnosis not present

## 2016-11-28 DIAGNOSIS — I447 Left bundle-branch block, unspecified: Secondary | ICD-10-CM | POA: Insufficient documentation

## 2016-11-28 DIAGNOSIS — I428 Other cardiomyopathies: Secondary | ICD-10-CM | POA: Diagnosis not present

## 2016-11-28 DIAGNOSIS — I429 Cardiomyopathy, unspecified: Secondary | ICD-10-CM | POA: Diagnosis not present

## 2016-11-28 HISTORY — PX: BIV ICD GENERATOR CHANGEOUT: EP1194

## 2016-11-28 LAB — CBC
HCT: 32.8 % — ABNORMAL LOW (ref 39.0–52.0)
Hemoglobin: 10.2 g/dL — ABNORMAL LOW (ref 13.0–17.0)
MCH: 27.9 pg (ref 26.0–34.0)
MCHC: 31.1 g/dL (ref 30.0–36.0)
MCV: 89.9 fL (ref 78.0–100.0)
Platelets: 160 10*3/uL (ref 150–400)
RBC: 3.65 MIL/uL — ABNORMAL LOW (ref 4.22–5.81)
RDW: 18.6 % — AB (ref 11.5–15.5)
WBC: 8.4 10*3/uL (ref 4.0–10.5)

## 2016-11-28 LAB — BASIC METABOLIC PANEL
Anion gap: 16 — ABNORMAL HIGH (ref 5–15)
BUN: 55 mg/dL — ABNORMAL HIGH (ref 6–20)
CALCIUM: 9.9 mg/dL (ref 8.9–10.3)
CO2: 26 mmol/L (ref 22–32)
CREATININE: 9.2 mg/dL — AB (ref 0.61–1.24)
Chloride: 97 mmol/L — ABNORMAL LOW (ref 101–111)
GFR calc non Af Amer: 5 mL/min — ABNORMAL LOW (ref >60)
GFR, EST AFRICAN AMERICAN: 6 mL/min — AB (ref >60)
Glucose, Bld: 97 mg/dL (ref 65–99)
Potassium: 4.3 mmol/L (ref 3.5–5.1)
SODIUM: 139 mmol/L (ref 135–145)

## 2016-11-28 LAB — SURGICAL PCR SCREEN
MRSA, PCR: NEGATIVE
STAPHYLOCOCCUS AUREUS: NEGATIVE

## 2016-11-28 SURGERY — BIV ICD GENERATOR CHANGEOUT
Anesthesia: LOCAL

## 2016-11-28 MED ORDER — CEFAZOLIN SODIUM-DEXTROSE 2-4 GM/100ML-% IV SOLN
INTRAVENOUS | Status: AC
  Filled 2016-11-28: qty 100

## 2016-11-28 MED ORDER — OXYCODONE-ACETAMINOPHEN 5-325 MG PO TABS
ORAL_TABLET | ORAL | Status: AC
  Administered 2016-11-28: 1 via ORAL
  Filled 2016-11-28: qty 1

## 2016-11-28 MED ORDER — LIDOCAINE HCL (PF) 1 % IJ SOLN
INTRAMUSCULAR | Status: AC
Start: 2016-11-28 — End: ?
  Filled 2016-11-28: qty 30

## 2016-11-28 MED ORDER — MIDAZOLAM HCL 5 MG/5ML IJ SOLN
INTRAMUSCULAR | Status: DC | PRN
  Administered 2016-11-28 (×3): 1 mg via INTRAVENOUS

## 2016-11-28 MED ORDER — GENTAMICIN SULFATE 40 MG/ML IJ SOLN
80.0000 mg | INTRAMUSCULAR | Status: AC
  Administered 2016-11-28: 80 mg

## 2016-11-28 MED ORDER — LIDOCAINE HCL (PF) 1 % IJ SOLN
INTRAMUSCULAR | Status: AC
  Filled 2016-11-28: qty 60

## 2016-11-28 MED ORDER — MUPIROCIN 2 % EX OINT
TOPICAL_OINTMENT | Freq: Once | CUTANEOUS | Status: AC
  Administered 2016-11-28: 1 via NASAL
  Filled 2016-11-28: qty 22

## 2016-11-28 MED ORDER — CEFAZOLIN SODIUM-DEXTROSE 1-4 GM/50ML-% IV SOLN: 1.0000 g | Freq: Four times a day (QID) | INTRAVENOUS | Status: DC

## 2016-11-28 MED ORDER — CEFAZOLIN SODIUM-DEXTROSE 2-4 GM/100ML-% IV SOLN
2.0000 g | INTRAVENOUS | Status: AC
  Administered 2016-11-28: 2 g via INTRAVENOUS
  Filled 2016-11-28: qty 100

## 2016-11-28 MED ORDER — OXYCODONE-ACETAMINOPHEN 5-325 MG PO TABS
1.0000 | ORAL_TABLET | Freq: Once | ORAL | Status: AC
  Administered 2016-11-28: 1 via ORAL

## 2016-11-28 MED ORDER — MIDAZOLAM HCL 5 MG/5ML IJ SOLN
INTRAMUSCULAR | Status: AC
  Filled 2016-11-28: qty 5

## 2016-11-28 MED ORDER — SODIUM CHLORIDE 0.9 % IR SOLN
Status: AC
  Filled 2016-11-28: qty 2

## 2016-11-28 MED ORDER — SODIUM CHLORIDE 0.9 % IV SOLN
INTRAVENOUS | Status: DC
  Administered 2016-11-28: 08:00:00 via INTRAVENOUS

## 2016-11-28 MED ORDER — MUPIROCIN 2 % EX OINT
TOPICAL_OINTMENT | CUTANEOUS | Status: AC
  Filled 2016-11-28: qty 22

## 2016-11-28 MED ORDER — FENTANYL CITRATE (PF) 100 MCG/2ML IJ SOLN
INTRAMUSCULAR | Status: DC | PRN
  Administered 2016-11-28 (×3): 25 ug via INTRAVENOUS

## 2016-11-28 MED ORDER — LIDOCAINE HCL (PF) 1 % IJ SOLN
INTRAMUSCULAR | Status: DC | PRN
  Administered 2016-11-28: 50 mL

## 2016-11-28 MED ORDER — ONDANSETRON HCL 4 MG/2ML IJ SOLN: 4.0000 mg | Freq: Four times a day (QID) | INTRAMUSCULAR | Status: DC | PRN

## 2016-11-28 MED ORDER — FENTANYL CITRATE (PF) 100 MCG/2ML IJ SOLN
INTRAMUSCULAR | Status: AC
  Filled 2016-11-28: qty 2

## 2016-11-28 MED ORDER — ACETAMINOPHEN 325 MG PO TABS: 325.0000 mg | ORAL_TABLET | ORAL | Status: DC | PRN

## 2016-11-28 SURGICAL SUPPLY — 6 items
CABLE SURGICAL S-101-97-12 (CABLE) ×2 IMPLANT
DEVICE DISSECT PLASMABLAD 3.0S (MISCELLANEOUS) ×1 IMPLANT
ICD UNIFY ASUR CRT CD3357-40Q (ICD Generator) ×2 IMPLANT
PAD DEFIB LIFELINK (PAD) ×2 IMPLANT
PLASMABLADE 3.0S (MISCELLANEOUS) ×2
TRAY PACEMAKER INSERTION (PACKS) ×2 IMPLANT

## 2016-11-28 NOTE — H&P (Signed)
Jeovany Spears is a 66 y.o. male with a history of chronic systolic heart failure. He has a CRT-D in place. The battery has reached ERI. He presents for generator change. On exam, RRR, no murmurs, lungs clear. Risks and benefits explained. Risks include but not limited to bleeding and infection. The patient understands the risks and has agreed to the procedure.  Jedidiah Demartini, Md 11/28/2016 7:46 AM  ICD Criteria  Current LVEF:15-20%. Within 12 months prior to implant: Yes   Heart failure history: Yes, Class II  Cardiomyopathy history: Yes, Non-Ischemic Cardiomyopathy.  Atrial Fibrillation/Atrial Flutter: No.  Ventricular tachycardia history: No.  Cardiac arrest history: No.  History of syndromes with risk of sudden death: No.  Previous ICD: Yes, Reason for ICD:  Primary prevention.  Current ICD indication: Primary  PPM indication: No.   Class I or II Bradycardia indication present: No  Beta Blocker therapy for 3 or more months: Yes, prescribed.   Ace Inhibitor/ARB therapy for 3 or more months: Yes, prescribed.

## 2016-11-28 NOTE — Discharge Instructions (Signed)
Keep incision clean and dry for 10 days. °No driving for 2 days.  °You can remove outer dressing tomorrow. °Leave steri-strips (little pieces of tape) on until seen in the office for wound check appointment. °Call the office ([phone]) for redness, drainage, swelling, or fever. ° °

## 2016-11-29 ENCOUNTER — Encounter (HOSPITAL_COMMUNITY): Payer: Self-pay | Admitting: Cardiology

## 2016-12-01 MED FILL — Sodium Chloride Irrigation Soln 0.9%: Qty: 500 | Status: AC

## 2016-12-01 MED FILL — Gentamicin Sulfate Inj 40 MG/ML: INTRAMUSCULAR | Qty: 2 | Status: AC

## 2016-12-11 ENCOUNTER — Ambulatory Visit (INDEPENDENT_AMBULATORY_CARE_PROVIDER_SITE_OTHER): Payer: Medicare Other | Admitting: *Deleted

## 2016-12-11 DIAGNOSIS — I5022 Chronic systolic (congestive) heart failure: Secondary | ICD-10-CM | POA: Diagnosis not present

## 2016-12-11 DIAGNOSIS — I472 Ventricular tachycardia, unspecified: Secondary | ICD-10-CM

## 2016-12-11 LAB — CUP PACEART INCLINIC DEVICE CHECK
Brady Statistic RA Percent Paced: 0.13 %
Brady Statistic RV Percent Paced: 98 %
Date Time Interrogation Session: 20180514145757
HighPow Impedance: 45.9964
Implantable Lead Implant Date: 20101123
Implantable Lead Implant Date: 20101123
Implantable Lead Implant Date: 20101123
Implantable Lead Location: 753858
Implantable Pulse Generator Implant Date: 20180501
Lead Channel Impedance Value: 350 Ohm
Lead Channel Pacing Threshold Amplitude: 0.5 V
Lead Channel Pacing Threshold Amplitude: 0.75 V
Lead Channel Pacing Threshold Amplitude: 1.75 V
Lead Channel Pacing Threshold Pulse Width: 0.5 ms
Lead Channel Pacing Threshold Pulse Width: 0.5 ms
Lead Channel Sensing Intrinsic Amplitude: 0.6 mV
Lead Channel Sensing Intrinsic Amplitude: 10.4 mV
Lead Channel Setting Pacing Amplitude: 2 V
Lead Channel Setting Pacing Amplitude: 2.75 V
Lead Channel Setting Pacing Pulse Width: 0.5 ms
MDC IDC LEAD LOCATION: 753859
MDC IDC LEAD LOCATION: 753860
MDC IDC MSMT LEADCHNL LV IMPEDANCE VALUE: 487.5 Ohm
MDC IDC MSMT LEADCHNL LV PACING THRESHOLD AMPLITUDE: 1.75 V
MDC IDC MSMT LEADCHNL LV PACING THRESHOLD PULSEWIDTH: 0.5 ms
MDC IDC MSMT LEADCHNL RA PACING THRESHOLD AMPLITUDE: 0.5 V
MDC IDC MSMT LEADCHNL RA PACING THRESHOLD PULSEWIDTH: 0.5 ms
MDC IDC MSMT LEADCHNL RV IMPEDANCE VALUE: 425 Ohm
MDC IDC MSMT LEADCHNL RV PACING THRESHOLD AMPLITUDE: 0.75 V
MDC IDC MSMT LEADCHNL RV PACING THRESHOLD PULSEWIDTH: 0.5 ms
MDC IDC MSMT LEADCHNL RV PACING THRESHOLD PULSEWIDTH: 0.5 ms
MDC IDC SET LEADCHNL RV PACING AMPLITUDE: 2.5 V
MDC IDC SET LEADCHNL RV PACING PULSEWIDTH: 0.5 ms
MDC IDC SET LEADCHNL RV SENSING SENSITIVITY: 0.5 mV
Pulse Gen Serial Number: 7355903

## 2016-12-11 NOTE — Progress Notes (Signed)
Wound check appointment. Steri-strips removed. Wound without redness or edema. Incision edges mostly approximated. Stitch clipped from L corner of incision. Antibiotic ointment/steri strips reapplied to the L incision. Patient will follow up in 1 week for a wound recheck.  CRT-D check in clinic. Normal device function. Thresholds, sensing, and impedances consistent with implant measurements. Device programmed at appropriate safety margins. Histogram distribution appropriate for patient and level of activity. (2) mode switches, AT per EGMs. (1) VT-NS episode noted x 10bts. Patient educated about wound care, arm mobility, and shock plan. ROV in 1 week w/DC, and 3 months with WC.

## 2016-12-18 ENCOUNTER — Ambulatory Visit (INDEPENDENT_AMBULATORY_CARE_PROVIDER_SITE_OTHER): Payer: Self-pay | Admitting: *Deleted

## 2016-12-18 DIAGNOSIS — I5022 Chronic systolic (congestive) heart failure: Secondary | ICD-10-CM

## 2016-12-18 NOTE — Progress Notes (Signed)
Patient presents to the office for a wound recheck s/p implant on 5/1. Steri strips removed prior to appt. Wound well healed without redness or edema. Incision edges approximated. Patient will follow up with WC as scheduled.

## 2016-12-21 ENCOUNTER — Inpatient Hospital Stay (HOSPITAL_COMMUNITY)
Admission: EM | Admit: 2016-12-21 | Discharge: 2016-12-23 | DRG: 314 | Disposition: A | Discharge status: Home / Self Care | Payer: Medicare Other | Attending: Internal Medicine | Admitting: Internal Medicine

## 2016-12-21 ENCOUNTER — Encounter (HOSPITAL_COMMUNITY): Payer: Self-pay | Admitting: Emergency Medicine

## 2016-12-21 DIAGNOSIS — R011 Cardiac murmur, unspecified: Secondary | ICD-10-CM

## 2016-12-21 DIAGNOSIS — Z992 Dependence on renal dialysis: Secondary | ICD-10-CM | POA: Diagnosis not present

## 2016-12-21 DIAGNOSIS — F17210 Nicotine dependence, cigarettes, uncomplicated: Secondary | ICD-10-CM | POA: Diagnosis present

## 2016-12-21 DIAGNOSIS — I959 Hypotension, unspecified: Secondary | ICD-10-CM | POA: Diagnosis present

## 2016-12-21 DIAGNOSIS — R55 Syncope and collapse: Secondary | ICD-10-CM | POA: Diagnosis present

## 2016-12-21 DIAGNOSIS — I340 Nonrheumatic mitral (valve) insufficiency: Secondary | ICD-10-CM | POA: Diagnosis not present

## 2016-12-21 DIAGNOSIS — I5022 Chronic systolic (congestive) heart failure: Secondary | ICD-10-CM | POA: Diagnosis present

## 2016-12-21 DIAGNOSIS — N186 End stage renal disease: Secondary | ICD-10-CM | POA: Diagnosis present

## 2016-12-21 DIAGNOSIS — B1910 Unspecified viral hepatitis B without hepatic coma: Secondary | ICD-10-CM | POA: Diagnosis present

## 2016-12-21 DIAGNOSIS — I132 Hypertensive heart and chronic kidney disease with heart failure and with stage 5 chronic kidney disease, or end stage renal disease: Secondary | ICD-10-CM | POA: Diagnosis present

## 2016-12-21 DIAGNOSIS — Z79899 Other long term (current) drug therapy: Secondary | ICD-10-CM | POA: Diagnosis not present

## 2016-12-21 DIAGNOSIS — Z95810 Presence of automatic (implantable) cardiac defibrillator: Secondary | ICD-10-CM | POA: Diagnosis not present

## 2016-12-21 DIAGNOSIS — Z950 Presence of cardiac pacemaker: Secondary | ICD-10-CM | POA: Diagnosis present

## 2016-12-21 DIAGNOSIS — D638 Anemia in other chronic diseases classified elsewhere: Secondary | ICD-10-CM | POA: Diagnosis present

## 2016-12-21 HISTORY — DX: Hypertensive heart and chronic kidney disease with heart failure and with stage 5 chronic kidney disease, or end stage renal disease: I13.2

## 2016-12-21 HISTORY — DX: Essential (primary) hypertension: I10

## 2016-12-21 HISTORY — DX: Chronic systolic (congestive) heart failure: I50.22

## 2016-12-21 HISTORY — DX: Hyperlipidemia, unspecified: E78.5

## 2016-12-21 HISTORY — DX: End stage renal disease: N18.6

## 2016-12-21 LAB — CBC
HCT: 33.6 % — ABNORMAL LOW (ref 39.0–52.0)
Hemoglobin: 10.1 g/dL — ABNORMAL LOW (ref 13.0–17.0)
MCH: 27.5 pg (ref 26.0–34.0)
MCHC: 30.1 g/dL (ref 30.0–36.0)
MCV: 91.6 fL (ref 78.0–100.0)
PLATELETS: 161 10*3/uL (ref 150–400)
RBC: 3.67 MIL/uL — ABNORMAL LOW (ref 4.22–5.81)
RDW: 16.8 % — AB (ref 11.5–15.5)
WBC: 5.8 10*3/uL (ref 4.0–10.5)

## 2016-12-21 LAB — BASIC METABOLIC PANEL
Anion gap: 14 (ref 5–15)
BUN: 37 mg/dL — ABNORMAL HIGH (ref 6–20)
CALCIUM: 8.8 mg/dL — AB (ref 8.9–10.3)
CO2: 29 mmol/L (ref 22–32)
CREATININE: 9.24 mg/dL — AB (ref 0.61–1.24)
Chloride: 96 mmol/L — ABNORMAL LOW (ref 101–111)
GFR, EST AFRICAN AMERICAN: 6 mL/min — AB (ref >60)
GFR, EST NON AFRICAN AMERICAN: 5 mL/min — AB (ref >60)
Glucose, Bld: 80 mg/dL (ref 65–99)
Potassium: 3.5 mmol/L (ref 3.5–5.1)
SODIUM: 139 mmol/L (ref 135–145)

## 2016-12-21 LAB — LACTIC ACID, PLASMA
LACTIC ACID, VENOUS: 1.8 mmol/L (ref 0.5–1.9)
Lactic Acid, Venous: 2.1 mmol/L (ref 0.5–1.9)

## 2016-12-21 LAB — TROPONIN I: TROPONIN I: 0.04 ng/mL — AB (ref <0.03)

## 2016-12-21 LAB — MAGNESIUM: MAGNESIUM: 1.9 mg/dL (ref 1.7–2.4)

## 2016-12-21 MED ORDER — MIDODRINE HCL 5 MG PO TABS
5.0000 mg | ORAL_TABLET | Freq: Two times a day (BID) | ORAL | Status: DC
  Administered 2016-12-21 – 2016-12-23 (×4): 5 mg via ORAL
  Filled 2016-12-21 (×3): qty 1

## 2016-12-21 MED ORDER — CALCITRIOL 0.5 MCG PO CAPS
1.0000 ug | ORAL_CAPSULE | ORAL | Status: DC
  Administered 2016-12-23: 1 ug via ORAL
  Filled 2016-12-21: qty 2

## 2016-12-21 MED ORDER — CALCIUM ACETATE 667 MG PO CAPS: 2001.0000 mg | ORAL_CAPSULE | Freq: Three times a day (TID) | ORAL | Status: DC

## 2016-12-21 MED ORDER — CINACALCET HCL 30 MG PO TABS
120.0000 mg | ORAL_TABLET | ORAL | Status: DC
  Administered 2016-12-21: 120 mg via ORAL
  Filled 2016-12-21: qty 4

## 2016-12-21 MED ORDER — SENNOSIDES-DOCUSATE SODIUM 8.6-50 MG PO TABS: 1.0000 | ORAL_TABLET | Freq: Every evening | ORAL | Status: DC | PRN

## 2016-12-21 MED ORDER — RENA-VITE PO TABS
1.0000 | ORAL_TABLET | Freq: Every day | ORAL | Status: DC
  Administered 2016-12-21 – 2016-12-22 (×2): 1 via ORAL
  Filled 2016-12-21 (×2): qty 1

## 2016-12-21 MED ORDER — SODIUM CHLORIDE 0.9% FLUSH
3.0000 mL | Freq: Two times a day (BID) | INTRAVENOUS | Status: DC
  Administered 2016-12-21 – 2016-12-22 (×3): 3 mL via INTRAVENOUS

## 2016-12-21 MED ORDER — LIDOCAINE-PRILOCAINE 2.5-2.5 % EX CREA: 1.0000 "application " | TOPICAL_CREAM | CUTANEOUS | Status: DC

## 2016-12-21 MED ORDER — ONDANSETRON HCL 4 MG PO TABS: 4.0000 mg | ORAL_TABLET | Freq: Four times a day (QID) | ORAL | Status: DC | PRN

## 2016-12-21 MED ORDER — HEPARIN SODIUM (PORCINE) 5000 UNIT/ML IJ SOLN
5000.0000 [IU] | Freq: Three times a day (TID) | INTRAMUSCULAR | Status: DC
  Administered 2016-12-21 – 2016-12-22 (×3): 5000 [IU] via SUBCUTANEOUS
  Filled 2016-12-21 (×4): qty 1

## 2016-12-21 MED ORDER — ONDANSETRON HCL 4 MG/2ML IJ SOLN: 4.0000 mg | Freq: Four times a day (QID) | INTRAMUSCULAR | Status: DC | PRN

## 2016-12-21 MED ORDER — SEVELAMER CARBONATE 800 MG PO TABS
3200.0000 mg | ORAL_TABLET | Freq: Three times a day (TID) | ORAL | Status: DC
  Administered 2016-12-22 (×2): 3200 mg via ORAL
  Filled 2016-12-21 (×3): qty 4

## 2016-12-21 MED ORDER — CINACALCET HCL 30 MG PO TABS: 30.0000 mg | ORAL_TABLET | Freq: Every day | ORAL | Status: DC

## 2016-12-21 NOTE — ED Triage Notes (Addendum)
Pt arrives via [facility] from dialysis, pt states he went outside to smoke and got dizzy upon standing. Initial bp was in the 80's pt received ns, bp up to 100/50. Pt a/ox4, vss, resp e/u, CBG 86 pta. Pt states he had a pacemaker batteries replaced 1 week ago.

## 2016-12-21 NOTE — ED Notes (Signed)
[employer] rep contacted this RN stating that patient has had no events present on interrogation of pacemaker/defibrillator today.

## 2016-12-21 NOTE — ED Notes (Signed)
[employer] pacemaker interrogated

## 2016-12-21 NOTE — Progress Notes (Signed)
Pt is alert and oriented denies dizziness, pain or discomfort at rest, foots are swollen and sore to touch

## 2016-12-21 NOTE — ED Notes (Signed)
ED Provider at bedside. 

## 2016-12-21 NOTE — ED Provider Notes (Signed)
[facility] Provider Note   CSN: [id number] Arrival date & time: 12/21/16  1316     History   Chief Complaint Chief Complaint  Patient presents with  . Dizziness    HPI Willie Andrade is a 66 y.o. male.  The history is provided by the patient.  Loss of Consciousness   This is a new problem. The current episode started 1 to 2 hours ago. Episode frequency: pt states "I never felt like that" The problem has been resolved. There was no loss of consciousness. The problem is associated with normal activity (went out side to smoke prior to HD today, but did not actually get any HD). Associated symptoms include diaphoresis and light-headedness. Pertinent negatives include abdominal pain, chest pain, congestion, fever, headaches, nausea and vomiting. Treatments tried: layed against to the wall to prevent falling.    Past Medical History:  Diagnosis Date  . ESRD (end stage renal disease) on dialysis (HCC)   . Hepatitis B   . Tobacco abuse     Patient Active Problem List   Diagnosis Date Noted  . Pre-syncope 12/21/2016  . Chronic systolic heart failure (HCC) 10/11/2016  . 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 Pacemaker 10/10/2016  . ESRD (end stage renal disease) (HCC) 02/14/2016    Past Surgical History:  Procedure Laterality Date  . BIV ICD GENERATOR CHANGEOUT N/A 11/28/2016   Procedure: BiV ICD Musician;  Surgeon: Will Jorja Loa, MD;  Location: [facility] INVASIVE [facility];  Service: Cardiovascular;  Laterality: N/A;       Home Medications    Prior to Admission medications   Medication Sig Start Date End Date Taking? Authorizing Provider  calcium acetate (PHOSLO) 667 MG capsule Take 2,001 mg by mouth 3 (three) times daily with meals.     [provider]  cinacalcet (SENSIPAR) 30 MG tablet Take 30 mg by mouth daily. Tues / Thurs / Sat    [provider]  lidocaine-prilocaine (EMLA) cream Apply 1 application topically Every Tuesday,Thursday,and Saturday with dialysis.    [provider]  midodrine (PROAMATINE) 5 MG tablet Take 5 mg by mouth 2 (two) times daily. 06/29/16   [provider]    Family History Family History  Problem Relation Age of Onset  . Diabetes Mother   . Gout Mother   . Stroke Father   . Gout Maternal Grandmother   . Gout Sister   . Asthma Brother     Social History Social History  Substance Use Topics  . Smoking status: Current Every Day Smoker    Packs/day: 0.00    Types: Cigarettes  . Smokeless tobacco: Never Used  . Alcohol use No     Allergies   Patient has no known allergies.   Review of Systems Review of Systems  Constitutional: Positive for diaphoresis. Negative for fever.  HENT: Negative for congestion.   Respiratory: Negative for cough and shortness of breath.   Cardiovascular: Positive for syncope. Negative for chest pain and leg swelling.  Gastrointestinal: Negative for abdominal pain, nausea and vomiting.  Genitourinary:       ESRD on HD t/th/sat  Skin: Negative.   Neurological: Positive for light-headedness. Negative for headaches.  All other systems reviewed and are negative.    Physical Exam Updated Vital Signs BP  96/80   Pulse 91   Temp (S) 98.8 F (37.1 C) (Rectal)   Resp 18   Ht 6\' 2"  (1.88 m)   Wt 69.2 kg (152 lb 8.9 oz)   SpO2 97%   BMI 19.59 kg/m  ED Triage Vitals  Enc Vitals Group     BP 12/21/16 1323 103/73     Pulse Rate 12/21/16 1323 82     Resp 12/21/16 1323 13     Temp 12/21/16 1326 98.2 F (36.8 C)     Temp Source 12/21/16 1326 Oral     SpO2 12/21/16 1323 99 %     Weight 12/21/16 1326 152 lb 8.9 oz (69.2 kg)     Height 12/21/16 1326 6\' 2"  (1.88 m)     Head Circumference --      Peak Flow --       Pain Score 12/21/16 1321 0     Pain Loc --      Pain Edu? --      Excl. in GC? --     Physical Exam  Constitutional: He is oriented to person, place, and time. He appears well-developed and well-nourished.  Chronically ill appearing male  HENT:  Head: Normocephalic and atraumatic.  Mouth/Throat: Oropharynx is clear and moist.  Eyes: Conjunctivae are normal. Pupils are equal, round, and reactive to light.  Neck: Neck supple.  Cardiovascular: Normal rate and regular rhythm.   Murmur (grade 2 systolic murmur best at apex) heard. Pulmonary/Chest: Effort normal and breath sounds normal. No respiratory distress.  Abdominal: Soft. There is no tenderness. There is no guarding.  Musculoskeletal: He exhibits edema (1+ pitting edema). He exhibits no tenderness.  Neurological: He is alert and oriented to person, place, and time. No cranial nerve deficit. He exhibits normal muscle tone. Coordination normal.  Skin: Skin is warm and dry.  Psychiatric: He has a normal mood and affect.  Nursing note and vitals reviewed.    ED Treatments / Results  Labs (all labs ordered are listed, but only abnormal results are displayed) Labs Reviewed  CBC - Abnormal; Notable for the following:       Result Value   RBC 3.67 (*)    Hemoglobin 10.1 (*)    HCT 33.6 (*)    RDW 16.8 (*)    All other components within normal limits  BASIC METABOLIC PANEL - Abnormal; Notable for the following:    Chloride 96 (*)    BUN 37 (*)    Creatinine, Ser 9.24 (*)    Calcium 8.8 (*)    GFR calc non Af Amer 5 (*)    GFR calc Af Amer 6 (*)    All other components within normal limits  URINALYSIS, COMPLETE (UACMP) WITH MICROSCOPIC    EKG  EKG Interpretation  Date/Time:  Thursday Dec 21 2016 13:24:19 EDT Ventricular Rate:  86 PR Interval:    QRS Duration: 178 QT Interval:  470 QTC Calculation: 563 R Axis:   -145 Text Interpretation:  Atrial-sensed ventricular-paced rhythm No significant change since last tracing  Confirmed by [facility] MD, Denny Peon ([id number]) on 12/21/2016 1:30:34 PM       Radiology No results found.  Procedures Procedures (including critical care time)  Medications Ordered in ED Medications  calcium acetate (PHOSLO) capsule 2,001 mg (not administered)  cinacalcet (SENSIPAR) tablet 30 mg (not administered)  lidocaine-prilocaine (EMLA) cream 1 application (not administered)  midodrine (PROAMATINE) tablet 5 mg (not administered)  sodium chloride flush (NS) 0.9 % injection 3 mL (  not administered)  heparin injection 5,000 Units (not administered)  senna-docusate (Senokot-S) tablet 1 tablet (not administered)  ondansetron (ZOFRAN) tablet 4 mg (not administered)    Or  ondansetron (ZOFRAN) injection 4 mg (not administered)     Initial Impression / Assessment and Plan / ED Course  I have reviewed the triage vital signs and the nursing notes.  Pertinent labs & imaging results that were available during my care of the patient were reviewed by me and considered in my medical decision making (see chart for details).       EMERGENCY DEPARTMENT [place] CARDIAC EXAM "Study: Limited Ultrasound of the Heart and Pericardium"  INDICATIONS:syncope in HD pt Multiple views of the heart and pericardium were obtained in real-time with a multi-frequency probe.  PERFORMED RU:EAVWUJ IMAGES ARCHIVED?: Yes LIMITATIONS:  None VIEWS USED: Subcostal 4 chamber, Parasternal long axis, Parasternal short axis and Apical 4 chamber  INTERPRETATION: Cardiac activity present, Pericardial effusioin absent, Cardiac tamponade absent, Probable elevated CVP and Decreased contractility   Final Clinical Impressions(s) / ED Diagnoses   Patient is a 66 year old male with a history end-stage renal disease on dialysis Tuesday Thursday Saturday who presents for an episode of presyncope today that occurred prior to dialysis. Denies any recent fevers, cough, chest pain, shortness of breath. He was outside smoking a cigarette  when he all of a sudden felt faint and leaned against a wall to prevent falling. He states this has never happened to him in the past. He has had a recent pacemaker battery replacement. Vital signs here are unremarkable and exam reveals a somewhat sleepy male in no acute distress with a grade 2 murmur auscultated. Patient does not know anything about this murmur and this murmur does not appear prior physical exams. No fevers here. Bedside ultrasound without pericardial effusion. Per EMS he was hypotensive with systolics in the 80s as well as some tachycardia. Lab work here shows signs of mild electrolyte derangements but no indication for emergent dialysis at this time. Given his comorbidities with his hypotension as well as symptoms patient be admitted to the hospital for further management and evaluation. Nephrology consult it for dialysis.    Final diagnoses:  Near syncope  Cardiac murmur    New Prescriptions New Prescriptions   No medications on file     Marijean Niemann, MD 12/21/16 1635    Alvira Monday, MD 12/22/16 1204

## 2016-12-21 NOTE — Progress Notes (Signed)
Patient with frequent 9 beats of PVC's with complex Vtach.Patient asymptomatic  MD made aware.

## 2016-12-21 NOTE — Consult Note (Signed)
[facility] Renal Consultation Note    Indication for Consultation:  Management of ESRD/hemodialysis; anemia, hypertension/volume and secondary hyperparathyroidism  HPI: Willie Andrade is a 66 y.o. male with ESRD on HD, chronic systolic CHF s/p AICD , recent generator changeout 5/1, Hepatitis B. He presented to the ED today after a syncopal episode at his dialysis center. Before his treatment he went outside to smoke and began to feel "dizzy as hell" and felt like he was going to fall. Dialysis staff called EMS. He did not lose consciousness.  Vitals in ED  BP 110/77 SpO2 99 % on RA, HR90. Labs significant for K 3.5 but otherwise stable. CXR pending. Will be admitted for further evaluation of presyncope Seen in ED currently, awake and alert. He has no complaints currently other than being hungry. He says he has never had an episode like this before. Denies HA, chest pain, SOB, nausea, vomiting, diarrhea.  Dialyzes at [facility]. Frequently cuts treatments short. Usually getting <3 hours of dialysis per treatment. Last treatment was Tuesday 5/22 for 2:53. He has some complaints about itching and shoulder pain that he says limit his treatment time. SBP 100s during treatments. Takes midodrine prior to treatment.   Past Medical History:  Diagnosis Date  . ESRD (end stage renal disease) on dialysis (HCC)   . Hepatitis B   . Tobacco abuse    Past Surgical History:  Procedure Laterality Date  . BIV ICD GENERATOR CHANGEOUT N/A 11/28/2016   Procedure: BiV ICD Musician;  Surgeon: Will Jorja Loa, MD;  Location: [facility] INVASIVE CV LAB;  Service: Cardiovascular;  Laterality: N/A;   Family History  Problem Relation Age of Onset  . Diabetes Mother   . Gout Mother   . Stroke Father   . Gout Maternal Grandmother   . Gout Sister   . Asthma Brother    Social History:  reports that he has been smoking Cigarettes.  He has been smoking about 0.00 packs per  day. He has never used smokeless tobacco. He reports that he does not drink alcohol or use drugs. No Known Allergies Prior to Admission medications   Medication Sig Start Date End Date Taking? Authorizing Provider  calcium acetate (PHOSLO) 667 MG capsule Take 2,001 mg by mouth 3 (three) times daily with meals.    [provider]  cinacalcet (SENSIPAR) 30 MG tablet Take 30 mg by mouth daily. Tues / Thurs / Sat    [provider]  lidocaine-prilocaine (EMLA) cream Apply 1 application topically Every Tuesday,Thursday,and Saturday with dialysis.    [provider]  midodrine (PROAMATINE) 5 MG tablet Take 5 mg by mouth 2 (two) times daily. 06/29/16   [provider]   Current Facility-Administered Medications  Medication Dose Route Frequency Provider Last Rate Last Dose  . calcium acetate (PHOSLO) capsule 2,001 mg  2,001 mg Oral TID WC Marcos Eke, PA-C      . [START ON 12/22/2016] cinacalcet (SENSIPAR) tablet 30 mg  30 mg Oral Daily Wertman, Sara E, PA-C      . heparin injection 5,000 Units  5,000 Units Subcutaneous Q8H Marcos Eke, PA-C      . [START ON 12/23/2016] lidocaine-prilocaine (EMLA) cream 1 application  1 application Topical Q T,Th,Sa-HD Wertman, Sara E, PA-C      . midodrine (PROAMATINE) tablet 5 mg  5 mg Oral BID Wertman, Sara E, PA-C      . ondansetron (ZOFRAN) tablet 4 mg  4 mg  Oral Q6H PRN Marcos Eke, PA-C       Or  . ondansetron [facility]) injection 4 mg  4 mg Intravenous Q6H PRN Marcos Eke, PA-C      . senna-docusate (Senokot-S) tablet 1 tablet  1 tablet Oral QHS PRN Marcos Eke, PA-C      . sodium chloride flush (NS) 0.9 % injection 3 mL  3 mL Intravenous Q12H Marcos Eke, PA-C       Current Outpatient Prescriptions  Medication Sig Dispense Refill  . calcium acetate (PHOSLO) 667 MG capsule Take 2,001 mg by mouth 3 (three) times daily with meals.    . cinacalcet (SENSIPAR) 30 MG tablet Take 30 mg by mouth daily. Tues /  Thurs / Sat    . lidocaine-prilocaine (EMLA) cream Apply 1 application topically Every Tuesday,Thursday,and Saturday with dialysis.    . midodrine (PROAMATINE) 5 MG tablet Take 5 mg by mouth 2 (two) times daily.  6    ROS: As per HPI otherwise negative.  Physical Exam: Vitals:   12/21/16 1600 12/21/16 1615 12/21/16 1630 12/21/16 1645  BP: 110/77 96/80 105/79 108/80  Pulse:  91 94 99  Resp:      Temp:      TempSrc:      SpO2:  97% 100%   Weight:      Height:         General: Thin frail appearing AAM NAD  Head: NCAT sclera not icteric MMM Neck: Supple. No JVD No masses Skin: scaly dry rash on shoulders  Lungs: Breathing is unlabored. St wheeze at bases  Heart: RRR with S1 S2 Abdomen: soft NT + BS Lower extremities:without edema or ischemic changes, no open wounds  Neuro: A & O  X 3. Moves all extremities spontaneously. Psych:  Responds to questions appropriately with a normal affect. Dialysis Access: LUE AVF sm aneurysms +bruit   Labs: Basic Metabolic Panel:  Recent Labs Lab 12/21/16 1355  NA 139  K 3.5  CL 96*  CO2 29  GLUCOSE 80  BUN 37*  CREATININE 9.24*  CALCIUM 8.8*   Liver Function Tests: No results for input(s): AST, ALT, ALKPHOS, BILITOT, PROT, ALBUMIN in the last 168 hours. No results for input(s): LIPASE, AMYLASE in the last 168 hours. No results for input(s): AMMONIA in the last 168 hours. CBC:  Recent Labs Lab 12/21/16 1355  WBC 5.8  HGB 10.1*  HCT 33.6*  MCV 91.6  PLT 161   Cardiac Enzymes: No results for input(s): CKTOTAL, CKMB, CKMBINDEX, TROPONINI in the last 168 hours. CBG: No results for input(s): GLUCAP in the last 168 hours. Iron Studies: No results for input(s): IRON, TIBC, TRANSFERRIN, FERRITIN in the last 72 hours. Studies/Results: No results found.  Dialysis Orders:  [facility] TTS  3h 180F BFR 400/800 2K/2Ca EDW 68 kg L AVF Heparin 1400 -Calcitriol 1 mcg PO q HD -Venofer 50 mg IV q week -Mircera 75 mcg IV q 2 weeks   (last dose 5/22) -Sensipar 120 mg PO q HD Renvela 4 q ac   Assessment/Plan: 1.  Dizziness/Presyncope - per patient new onset - per primary Echo pending  2. CHF s/p AICD/PPM  3.  ESRD -  TTS - missed HD today but no indication for urgent HD - plan HD tomorrow off schedule 4K bath with K 3.5 4.  Hypotension - on midodrine pre HD 5.  Anemia  - Hgb 10.1 on ESA  6.  Metabolic bone disease -  Cont VDRA/Sensipar/Renvela  binder  7.  Nutrition - Renal diet/vitamins   Tomasa Blase PA-C [facility] Pager [phone] 12/21/2016, 4:47 PM

## 2016-12-21 NOTE — H&P (Signed)
History and Physical    Willie Andrade ZOX:[id number] DOB: 27-May-1951 DOA: 12/21/2016   PCP: [name], MD   Patient coming from:  Home    Chief Complaint: presyncope   HPI: Willie Andrade is a 66 y.o. male with medical history significant for ESRD on HD T TH S , Hep B, tobacco abuse, Irreg heart beat s/p pacer placement with recent interrogation and change of battery,  presenting to the ED after experiencing an episode of prey syncope prior to HD. He reports never having similar symptoms. The was no loss of consciousness. He reported diaphoresis and lightheadedness. He denies any headaches nausea vomiting, or falling. He denies any syncope or seizures. No confusion was reported. The patient did not heed his head on the ground. He denies any worsening  shortness of breath or chest pain. He continues to feel very "unwell". He denies any abdominal pain,pelvic pain, or lower extremity swelling. His last dialysis was 2 days ago. He makes a small amount of urine. He denies any recent infections. He took his morning meds    ED Course:  BP 108/80   Pulse 99   Temp (S) 98.8 F (37.1 C) (Rectal)   Resp 18   Ht 6\' 2"  (1.88 m)   Wt 69.2 kg (152 lb 8.9 oz)   SpO2 100%   BMI 19.59 kg/m   Sodium 139 potassium 3.5 BUN 37 creatinine 9.24 GFR is 6 white count 5.8 hemoglobin 10.1 platelets 161 chest x-ray pending urine pending \\Urine  and blood culture pending. Review of Systems:  As per HPI otherwise all other systems reviewed and are negative  Past Medical History:  Diagnosis Date  . ESRD (end stage renal disease) on dialysis (HCC)   . Hepatitis B   . Tobacco abuse     Past Surgical History:  Procedure Laterality Date  . BIV ICD GENERATOR CHANGEOUT N/A 11/28/2016   Procedure: BiV ICD Musician;  Surgeon: Will Jorja Loa, MD;  Location: [facility];  Service: Cardiovascular;  Laterality: N/A;    Social History Social History   Social History  . Marital status:  Legally Separated    Spouse name: N/A  . Number of children: N/A  . Years of education: N/A   Occupational History  . Not on file.   Social History Main Topics  . Smoking status: Current Every Day Smoker    Packs/day: 0.00    Types: Cigarettes  . Smokeless tobacco: Never Used  . Alcohol use No  . Drug use: No  . Sexual activity: Not on file   Other Topics Concern  . Not on file   Social History Narrative  . No narrative on file     No Known Allergies  Family History  Problem Relation Age of Onset  . Diabetes Mother   . Gout Mother   . Stroke Father   . Gout Maternal Grandmother   . Gout Sister   . Asthma Brother       Prior to Admission medications   Medication Sig Start Date End Date Taking? Authorizing Provider  calcium acetate (PHOSLO) 667 MG capsule Take 2,001 mg by mouth 3 (three) times daily with meals.    [provider]  cinacalcet (SENSIPAR) 30 MG tablet Take 30 mg by mouth daily. Tues / Thurs / Sat    [provider]  lidocaine-prilocaine (EMLA) cream Apply 1 application topically Every Tuesday,Thursday,and Saturday with dialysis.    [provider]  midodrine (PROAMATINE) 5 MG tablet  Take 5 mg by mouth 2 (two) times daily. 06/29/16   [provider]    Physical Exam:  Vitals:   12/21/16 1600 12/21/16 1615 12/21/16 1630 12/21/16 1645  BP: 110/77 96/80 105/79 108/80  Pulse:  91 94 99  Resp:      Temp:      TempSrc:      SpO2:  97% 100%   Weight:      Height:       Constitutional: NAD,but ill appearing  Eyes: PERRL, lids and conjunctivae normal ENMT: Mucous membranes are moist, without exudate or lesions  Neck:noted JVD , supple, no masses, no thyromegaly Respiratory:  Decreased breath sounds L>R   no wheezing,  Normal respiratory effort  Cardiovascular: Paced Regular rate and rhythm, 2/6 murmurs, rubs or gallops. Trace  extremity edema. 2+ pedal pulses. No carotid bruits.  Abdomen: Soft, non tender, No  hepatosplenomegaly. Bowel sounds positive.  Musculoskeletal: no clubbing / cyanosis. Moves all extremities Skin: no jaundice, No lesions.  Neurologic: Sensation intact  Strength equal in all extremities Psychiatric:   Alert and oriented x 3.       Labs on Admission: I have personally reviewed following labs and imaging studies  CBC:  Recent Labs Lab 12/21/16 1355  WBC 5.8  HGB 10.1*  HCT 33.6*  MCV 91.6  PLT 161    Basic Metabolic Panel:  Recent Labs Lab 12/21/16 1355  NA 139  K 3.5  CL 96*  CO2 29  GLUCOSE 80  BUN 37*  CREATININE 9.24*  CALCIUM 8.8*    GFR: Estimated Creatinine Clearance: 7.7 mL/min (A) (by C-G formula based on SCr of 9.24 mg/dL (H)).  Liver Function Tests: No results for input(s): AST, ALT, ALKPHOS, BILITOT, PROT, ALBUMIN in the last 168 hours. No results for input(s): LIPASE, AMYLASE in the last 168 hours. No results for input(s): AMMONIA in the last 168 hours.  Coagulation Profile: No results for input(s): INR, PROTIME in the last 168 hours.  Cardiac Enzymes: No results for input(s): CKTOTAL, CKMB, CKMBINDEX, TROPONINI in the last 168 hours.  BNP (last 3 results) No results for input(s): PROBNP in the last 8760 hours.  HbA1C: No results for input(s): HGBA1C in the last 72 hours.  CBG: No results for input(s): GLUCAP in the last 168 hours.  Lipid Profile: No results for input(s): CHOL, HDL, LDLCALC, TRIG, CHOLHDL, LDLDIRECT in the last 72 hours.  Thyroid Function Tests: No results for input(s): TSH, T4TOTAL, FREET4, T3FREE, THYROIDAB in the last 72 hours.  Anemia Panel: No results for input(s): VITAMINB12, FOLATE, FERRITIN, TIBC, IRON, RETICCTPCT in the last 72 hours.  Urine analysis: No results found for: COLORURINE, APPEARANCEUR, LABSPEC, PHURINE, GLUCOSEU, HGBUR, BILIRUBINUR, KETONESUR, PROTEINUR, UROBILINOGEN, NITRITE, LEUKOCYTESUR  Sepsis Labs: @LABRCNTIP (procalcitonin:4,lacticidven:4) )No results found for this or any  previous visit (from the past 240 hour(s)).   Radiological Exams on Admission: No results found.  EKG: Independently reviewed.  Assessment/Plan Active Problems:   Pre-syncope   ESRD (end stage renal disease) (HCC)   Pacemaker   Chronic systolic heart failure (HCC)   Presyncope   Labs, EKG unrevealing. Neuro exam unremarkable. 2 d echo 09/2016  with severely reduced systolic function, The EF 15% to 20%. grade 3 diastolic dysfunction . EKG paced rhythm without ACS   Syncope order set  -admit for Tele be Inpatient  -seizure precautions 2 D echo IV fluids EKG in am Blood and Urine cultures  CXR  Hold Beta blockers but continue midrodine  Antiemetics  Hypotension: Unclear cause Septic shock doubted as no source of infection or fever is noted. had BP in the 80s systolic on admission, then improved to the 110s over 70s. WBC normal.  Echocardiogram ordered EKG in am  Continue midrodrine [employer]  Lactic acid  UA and cultures pending    ESRD on HD  TThS, Cr 9.24   Nephrology involved, notified by EDP for HD today Renal Diet. Other plans as per Nephrology Check CMET in am   Anemia of chronic disease Hemoglobin on admission 10.1 at baseline    Repeat CBC in am  No transfusion is indicated at this time   Chronic systolic  CHF  Last 2 D echo as above  Weight 152 lbs     PLace on Telemetry   N0o IVF as patient is for dialysis today  monitor I/Os and daily weights prn 02   DVT prophylaxis: Heparin   Code Status:   Full      Family Communication:  Discussed with patient Disposition Plan: Expect patient to be discharged to home after condition improves Consults called:    Nephrology by EDP  Admission status  Inpatient  tele    Marcos Eke, PA-C [facility]   12/21/2016, 5:13 PM

## 2016-12-21 NOTE — Progress Notes (Signed)
CRITICAL VALUE ALERT  Critical Value: Lactic acid 2.1 and  Troponin 0.04   Date & Time Notied: 12/21/16 9:07  Provider Notified: Antionette Char, MD  Orders Received/Actions taken: Magnesium and  Troponin level and EKG as needed.

## 2016-12-22 ENCOUNTER — Inpatient Hospital Stay (HOSPITAL_COMMUNITY): Payer: Medicare Other

## 2016-12-22 ENCOUNTER — Encounter (HOSPITAL_COMMUNITY): Payer: Self-pay | Admitting: Physician Assistant

## 2016-12-22 ENCOUNTER — Other Ambulatory Visit (HOSPITAL_COMMUNITY): Payer: Medicare Other

## 2016-12-22 DIAGNOSIS — I5022 Chronic systolic (congestive) heart failure: Secondary | ICD-10-CM

## 2016-12-22 DIAGNOSIS — R011 Cardiac murmur, unspecified: Secondary | ICD-10-CM

## 2016-12-22 DIAGNOSIS — Z950 Presence of cardiac pacemaker: Secondary | ICD-10-CM

## 2016-12-22 DIAGNOSIS — R55 Syncope and collapse: Secondary | ICD-10-CM

## 2016-12-22 DIAGNOSIS — N186 End stage renal disease: Secondary | ICD-10-CM

## 2016-12-22 LAB — CBC
HCT: 32.8 % — ABNORMAL LOW (ref 39.0–52.0)
Hemoglobin: 10 g/dL — ABNORMAL LOW (ref 13.0–17.0)
MCH: 27.7 pg (ref 26.0–34.0)
MCHC: 30.5 g/dL (ref 30.0–36.0)
MCV: 90.9 fL (ref 78.0–100.0)
PLATELETS: 172 10*3/uL (ref 150–400)
RBC: 3.61 MIL/uL — ABNORMAL LOW (ref 4.22–5.81)
RDW: 16.8 % — AB (ref 11.5–15.5)
WBC: 6 10*3/uL (ref 4.0–10.5)

## 2016-12-22 LAB — PROTIME-INR
INR: 1.49
Prothrombin Time: 18.2 seconds — ABNORMAL HIGH (ref 11.4–15.2)

## 2016-12-22 LAB — TROPONIN I
TROPONIN I: 0.03 ng/mL — AB (ref <0.03)
Troponin I: 0.04 ng/mL (ref <0.03)

## 2016-12-22 LAB — COMPREHENSIVE METABOLIC PANEL
ALT: 8 U/L — AB (ref 17–63)
AST: 14 U/L — AB (ref 15–41)
Albumin: 3.1 g/dL — ABNORMAL LOW (ref 3.5–5.0)
Alkaline Phosphatase: 91 U/L (ref 38–126)
Anion gap: 16 — ABNORMAL HIGH (ref 5–15)
BILIRUBIN TOTAL: 0.6 mg/dL (ref 0.3–1.2)
BUN: 46 mg/dL — AB (ref 6–20)
CHLORIDE: 97 mmol/L — AB (ref 101–111)
CO2: 24 mmol/L (ref 22–32)
CREATININE: 11.03 mg/dL — AB (ref 0.61–1.24)
Calcium: 8.1 mg/dL — ABNORMAL LOW (ref 8.9–10.3)
GFR calc Af Amer: 5 mL/min — ABNORMAL LOW (ref >60)
GFR, EST NON AFRICAN AMERICAN: 4 mL/min — AB (ref >60)
Glucose, Bld: 91 mg/dL (ref 65–99)
Potassium: 3.8 mmol/L (ref 3.5–5.1)
Sodium: 137 mmol/L (ref 135–145)
TOTAL PROTEIN: 6.3 g/dL — AB (ref 6.5–8.1)

## 2016-12-22 LAB — GLUCOSE, CAPILLARY: Glucose-Capillary: 87 mg/dL (ref 65–99)

## 2016-12-22 MED ORDER — HEPARIN SODIUM (PORCINE) 1000 UNIT/ML DIALYSIS
20.0000 [IU]/kg | INTRAMUSCULAR | Status: DC | PRN
  Administered 2016-12-22: 1400 [IU] via INTRAVENOUS_CENTRAL
  Filled 2016-12-22 (×2): qty 2

## 2016-12-22 MED ORDER — PENTAFLUOROPROP-TETRAFLUOROETH EX AERO: 1.0000 "application " | INHALATION_SPRAY | CUTANEOUS | Status: DC | PRN

## 2016-12-22 MED ORDER — SODIUM CHLORIDE 0.9 % IV SOLN
100.0000 mL | INTRAVENOUS | Status: DC | PRN
Start: 2016-12-22 — End: 2016-12-22

## 2016-12-22 MED ORDER — LIDOCAINE-PRILOCAINE 2.5-2.5 % EX CREA: 1.0000 "application " | TOPICAL_CREAM | CUTANEOUS | Status: DC | PRN

## 2016-12-22 MED ORDER — OXYCODONE-ACETAMINOPHEN 5-325 MG PO TABS
1.0000 | ORAL_TABLET | Freq: Once | ORAL | Status: AC
  Administered 2016-12-22: 1 via ORAL

## 2016-12-22 MED ORDER — LIDOCAINE HCL (PF) 1 % IJ SOLN: 5.0000 mL | INTRAMUSCULAR | Status: DC | PRN

## 2016-12-22 MED ORDER — SEVELAMER CARBONATE 800 MG PO TABS
3200.0000 mg | ORAL_TABLET | Freq: Three times a day (TID) | ORAL | Status: DC
  Administered 2016-12-23: 3200 mg via ORAL
  Filled 2016-12-22: qty 4

## 2016-12-22 MED ORDER — OXYCODONE-ACETAMINOPHEN 5-325 MG PO TABS
ORAL_TABLET | ORAL | Status: AC
  Filled 2016-12-22: qty 1

## 2016-12-22 MED ORDER — HEPARIN SODIUM (PORCINE) 1000 UNIT/ML DIALYSIS
1000.0000 [IU] | INTRAMUSCULAR | Status: DC | PRN
  Filled 2016-12-22: qty 1

## 2016-12-22 MED ORDER — SODIUM CHLORIDE 0.9 % IV SOLN: 100.0000 mL | INTRAVENOUS | Status: DC | PRN

## 2016-12-22 NOTE — Progress Notes (Signed)
PT Cancellation Note  Patient Details Name: Willie Andrade MRN: [id number] DOB: June 06, 1951   Cancelled Treatment:    Reason Eval/Treat Not Completed: Patient at procedure or test/unavailable.  Pt is at HD.  PT to check back tomorrow.  Thanks,    Rollene Rotunda. Danisha Brassfield, PT, DPT [phone]   12/22/2016, 2:03 PM

## 2016-12-22 NOTE — Progress Notes (Signed)
Device interrogated by [employer]. No arrhythmias within the 24 hr before admission.   He did have some arrhythmias on 05/14, no sx reported.  I have made him a f/u appt with Dr Elberta Fortis and sent a staff message to let Dr Elberta Fortis know about this.  Call if we can be of further assistance.  Theodore Demark, PA-C 12/22/2016 4:09 PM Beeper [phone]

## 2016-12-22 NOTE — Progress Notes (Signed)
OT Cancellation Note  Patient Details Name: Willie Andrade MRN: [id number] DOB: [birth year]   Cancelled Treatment:    Reason Eval/Treat Not Completed: Patient at procedure or test/ unavailable Pt at HD.  Will follow as schedule and pt availability allows.  Rosalio Loud 12/22/2016, 1:37 PM

## 2016-12-22 NOTE — Progress Notes (Signed)
Patient refused care during morning rounding/med pass. Visual assessment and minimal hands on assessment documented. Due to pt agitation and refusal for further assessment, no further specifics noted on shift assessment.

## 2016-12-22 NOTE — Progress Notes (Addendum)
Spoke with Dr Arthor Captain, made him aware that pt is refusing care. Per [facility], try for dialysis then possible eval for discharge if pt wont allow further treatment.   Per dialysis they are sending for pt now.   CCMD calls to notify of leads off. Leads were off when this nurse was at bedside, pt refused lead replacement initially; but quickly jerked the wires around requesting that staff "leave him the F alone". Interpreted as refusal to cooperate with telemetry orders, pt placed on standby with CCMD since MD aware of pts behavior.

## 2016-12-22 NOTE — Procedures (Signed)
I have personally attended this patient's dialysis session.   HD today off schedule (makeup for yesterday's missed tmt) 4K bath pending labs Lowered goal 2/2 SBP <100  L AVF 400  Camille Bal, MD [facility] [phone] Pager 12/22/2016, 12:13 PM

## 2016-12-22 NOTE — Progress Notes (Signed)
PROGRESS NOTE  Willie Andrade  UEA:[id number] DOB: 1951/04/04 DOA: 12/21/2016 PCP: [name], MD Outpatient Specialists:  Subjective: Patient reported he is sleepy when I was interviewing him and kept his eyes closed. Denies fever and chills, denies any other complaints. Pain by RN that he is refusing care provided by nursing staff including labs drawing and other interventions.  Brief Narrative:  Willie Andrade is a 66 y.o. male with medical history significant for ESRD on HD T TH S , Hep B, tobacco abuse, Irreg heart beat s/p pacer placement with recent interrogation and change of battery,  presenting to the ED after experiencing an episode of prey syncope prior to HD. He reports never having similar symptoms. The was no loss of consciousness. He reported diaphoresis and lightheadedness. He denies any headaches nausea vomiting, or falling. He denies any syncope or seizures. No confusion was reported. The patient did not heed his head on the ground. He denies any worsening  shortness of breath or chest pain. He continues to feel very "unwell". He denies any abdominal pain,pelvic pain, or lower extremity swelling. His last dialysis was 2 days ago. He makes a small amount of urine. He denies any recent infections. He took his morning meds    Assessment & Plan:   Active Problems:   ESRD (end stage renal disease) (HCC)   Pacemaker   Chronic systolic heart failure (HCC)   Pre-syncope   Presyncope   Labs, EKG unrevealing. Neuro exam unremarkable. 2 d echo 09/2016  with severely reduced systolic function, The EF 15% to 20%. grade 3 diastolic dysfunction .  -EKG paced rhythm without ACS,  -admit for Tele be Inpatient  -seizure precautions -2 D echo pending, patient refused labs before dialysis will cycle cardiac enzymes. -Marginal low blood pressure at 85/50, continue midodrine and hold beta blockers. -Given IV fluids, scheduled for dialysis today. -PT evaluated him, thigh-high  compression stockings. Check pacemaker interrogation.  Hypotension: Unclear cause Septic shock doubted as no source of infection or fever is noted. had BP in the 80s systolic on admission, then improved to the 110s over 70s. WBC normal.  Echocardiogram ordered Will review the records as patient with ejection fraction of 15-20% likely will have low blood pressure.  ESRD on HD  TThS, Cr 9.24   Nephrology involved, notified by EDP for HD today Renal Diet. Other plans as per Nephrology Check CMET in am  Anemia of chronic disease Hemoglobin on admission 10.1 at baseline    Repeat CBC in am  No transfusion is indicated at this time   Chronic systolic  CHF  Last 2 D echo as above  Weight 152 lbs     Place on Telemetry   N0o IVF as patient is for dialysis today  monitor I/Os and daily weights prn 02   DVT prophylaxis:  Code Status: Full Code Family Communication:  Disposition Plan:  Diet: Diet renal/carb modified with fluid restriction Diet-HS Snack? Nothing; Room service appropriate? Yes; Fluid consistency: Thin  Consultants:   Nephrology  Procedures:   HD  Antimicrobials:   None   Objective: Vitals:   12/22/16 1215 12/22/16 1230 12/22/16 1300 12/22/16 1330  BP: (!) 80/53 98/65 (!) 85/50 (!) 89/61  Pulse: 83 88 81 96  Resp:      Temp:      TempSrc:      SpO2:      Weight:      Height:        Intake/Output Summary (Last 24 hours)  at 12/22/16 1405 Last data filed at 12/22/16 1006  Gross per 24 hour  Intake              486 ml  Output                0 ml  Net              486 ml   Filed Weights   12/21/16 1713 12/22/16 0609 12/22/16 1105  Weight: 68 kg (149 lb 14.6 oz) 69.9 kg (154 lb 3.2 oz) 69.9 kg (154 lb 1.6 oz)    Examination: General exam: Appears calm and comfortable  Respiratory system: Clear to auscultation. Respiratory effort normal. Cardiovascular system: S1 & S2 heard, RRR. No JVD, murmurs, rubs, gallops or clicks. No pedal  edema. Gastrointestinal system: Abdomen is nondistended, soft and nontender. No organomegaly or masses felt. Normal bowel sounds heard. Central nervous system: Alert and oriented. No focal neurological deficits. Extremities: Symmetric 5 x 5 power. Skin: No rashes, lesions or ulcers Psychiatry: Judgement and insight appear normal. Mood & affect appropriate.   Data Reviewed: I have personally reviewed following labs and imaging studies  CBC:  Recent Labs Lab 12/21/16 1355 12/22/16 1135  WBC 5.8 6.0  HGB 10.1* 10.0*  HCT 33.6* 32.8*  MCV 91.6 90.9  PLT 161 172   Basic Metabolic Panel:  Recent Labs Lab 12/21/16 1355 12/21/16 2226 12/22/16 1135  NA 139  --  137  K 3.5  --  3.8  CL 96*  --  97*  CO2 29  --  24  GLUCOSE 80  --  91  BUN 37*  --  46*  CREATININE 9.24*  --  11.03*  CALCIUM 8.8*  --  8.1*  MG  --  1.9  --    GFR: Estimated Creatinine Clearance: 6.5 mL/min (A) (by C-G formula based on SCr of 11.03 mg/dL (H)). Liver Function Tests:  Recent Labs Lab 12/22/16 1135  AST 14*  ALT 8*  ALKPHOS 91  BILITOT 0.6  PROT 6.3*  ALBUMIN 3.1*   No results for input(s): LIPASE, AMYLASE in the last 168 hours. No results for input(s): AMMONIA in the last 168 hours. Coagulation Profile:  Recent Labs Lab 12/22/16 1135  INR 1.49   Cardiac Enzymes:  Recent Labs Lab 12/21/16 2007  TROPONINI 0.04*   BNP (last 3 results) No results for input(s): PROBNP in the last 8760 hours. HbA1C: No results for input(s): HGBA1C in the last 72 hours. CBG:  Recent Labs Lab 12/22/16 0638  GLUCAP 87   Lipid Profile: No results for input(s): CHOL, HDL, LDLCALC, TRIG, CHOLHDL, LDLDIRECT in the last 72 hours. Thyroid Function Tests: No results for input(s): TSH, T4TOTAL, FREET4, T3FREE, THYROIDAB in the last 72 hours. Anemia Panel: No results for input(s): VITAMINB12, FOLATE, FERRITIN, TIBC, IRON, RETICCTPCT in the last 72 hours. Urine analysis: No results found for:  COLORURINE, APPEARANCEUR, LABSPEC, PHURINE, GLUCOSEU, HGBUR, BILIRUBINUR, KETONESUR, PROTEINUR, UROBILINOGEN, NITRITE, LEUKOCYTESUR Sepsis Labs: @LABRCNTIP (procalcitonin:4,lacticidven:4)  ) Recent Results (from the past 240 hour(s))  Culture, blood (Routine X 2) w Reflex to ID Panel     Status: None (Preliminary result)   Collection Time: 12/21/16  8:02 PM  Result Value Ref Range Status   Specimen Description BLOOD RIGHT HAND  Final   Special Requests IN PEDIATRIC BOTTLE Blood Culture adequate volume  Final   Culture NO GROWTH < 24 HOURS  Final   Report Status PENDING  Incomplete  Culture, blood (Routine X 2)  w Reflex to ID Panel     Status: None (Preliminary result)   Collection Time: 12/21/16  8:41 PM  Result Value Ref Range Status   Specimen Description BLOOD RIGHT ANTECUBITAL  Final   Special Requests   Final    BOTTLES DRAWN AEROBIC AND ANAEROBIC Blood Culture adequate volume   Culture NO GROWTH < 24 HOURS  Final   Report Status PENDING  Incomplete     Invalid input(s): PROCALCITONIN, LACTICACIDVEN   Radiology Studies: X-ray Chest Pa And Lateral  Result Date: 12/22/2016 CLINICAL DATA:  Presyncope EXAM: CHEST  2 VIEW COMPARISON:  11/27/2016 FINDINGS: Right AICD remains in place, unchanged. Cardiomegaly. Patchy airspace opacity in the right lower lobe. Left lung is clear. No effusions or acute bony abnormality. IMPRESSION: Cardiomegaly. Patchy opacity in the right lower lobe.  Cannot exclude pneumonia. Electronically Signed   By: Charlett Nose M.D.   On: 12/22/2016 09:24        Scheduled Meds: . [START ON 12/23/2016] calcitRIOL  1 mcg Oral Q T,Th,Sa-HD  . cinacalcet  120 mg Oral Q T,Th,Sat-1800  . heparin  5,000 Units Subcutaneous Q8H  . [START ON 12/23/2016] lidocaine-prilocaine  1 application Topical Q T,Th,Sa-HD  . midodrine  5 mg Oral BID  . multivitamin  1 tablet Oral QHS  . oxyCODONE-acetaminophen      . sevelamer carbonate  3,200 mg Oral TID WC  . sodium chloride  flush  3 mL Intravenous Q12H   Continuous Infusions: . sodium chloride    . sodium chloride       LOS: 1 day    Time spent: 35 minutes    Katee Wentland A, MD [facility] Pager [phone]  If 7PM-7AM, please contact night-coverage [email or web address] Password [email or web address] 12/22/2016, 2:05 PM

## 2016-12-23 ENCOUNTER — Inpatient Hospital Stay (HOSPITAL_COMMUNITY): Payer: Medicare Other

## 2016-12-23 DIAGNOSIS — I340 Nonrheumatic mitral (valve) insufficiency: Secondary | ICD-10-CM

## 2016-12-23 LAB — BASIC METABOLIC PANEL
Anion gap: 15 (ref 5–15)
BUN: 21 mg/dL — AB (ref 6–20)
CHLORIDE: 95 mmol/L — AB (ref 101–111)
CO2: 25 mmol/L (ref 22–32)
Calcium: 8.6 mg/dL — ABNORMAL LOW (ref 8.9–10.3)
Creatinine, Ser: 6.61 mg/dL — ABNORMAL HIGH (ref 0.61–1.24)
GFR calc Af Amer: 9 mL/min — ABNORMAL LOW (ref >60)
GFR, EST NON AFRICAN AMERICAN: 8 mL/min — AB (ref >60)
GLUCOSE: 106 mg/dL — AB (ref 65–99)
POTASSIUM: 4.5 mmol/L (ref 3.5–5.1)
Sodium: 135 mmol/L (ref 135–145)

## 2016-12-23 LAB — CBC
HEMATOCRIT: 35.3 % — AB (ref 39.0–52.0)
Hemoglobin: 10.5 g/dL — ABNORMAL LOW (ref 13.0–17.0)
MCH: 27.5 pg (ref 26.0–34.0)
MCHC: 29.7 g/dL — AB (ref 30.0–36.0)
MCV: 92.4 fL (ref 78.0–100.0)
Platelets: 164 10*3/uL (ref 150–400)
RBC: 3.82 MIL/uL — ABNORMAL LOW (ref 4.22–5.81)
RDW: 16.9 % — AB (ref 11.5–15.5)
WBC: 7.7 10*3/uL (ref 4.0–10.5)

## 2016-12-23 LAB — ECHOCARDIOGRAM COMPLETE
Height: 74 in
Weight: 2395.08 oz

## 2016-12-23 LAB — TROPONIN I: TROPONIN I: 0.03 ng/mL — AB (ref <0.03)

## 2016-12-23 MED ORDER — MIDODRINE HCL 5 MG PO TABS: 5.0000 mg | ORAL_TABLET | Freq: Two times a day (BID) | ORAL | 6 refills | Status: DC

## 2016-12-23 MED ORDER — CINACALCET HCL 30 MG PO TABS: 30.0000 mg | ORAL_TABLET | Freq: Every day | ORAL | 2 refills | Status: DC

## 2016-12-23 MED ORDER — MIDODRINE HCL 5 MG PO TABS
ORAL_TABLET | ORAL | Status: AC
  Filled 2016-12-23: qty 1

## 2016-12-23 MED ORDER — CALCIUM ACETATE 667 MG PO CAPS
2001.0000 mg | ORAL_CAPSULE | Freq: Three times a day (TID) | ORAL | 2 refills | Status: DC
Start: 2016-12-23 — End: 2017-03-06

## 2016-12-23 MED ORDER — PENTAFLUOROPROP-TETRAFLUOROETH EX AERO
INHALATION_SPRAY | CUTANEOUS | Status: AC
  Filled 2016-12-23: qty 103.5

## 2016-12-23 NOTE — Discharge Summary (Signed)
Physician Discharge Summary  Willie Andrade ZOX:[id number] DOB: 1951/02/27 DOA: 12/21/2016  PCP: Willie Rakers, MD  Admit date: 12/21/2016 Discharge date: 12/23/2016  Admitted From: Home Disposition: Home  Recommendations for Outpatient Follow-up:  1. Follow up with PCP in 1-2 weeks 2. Please obtain BMP/CBC in one week  Home Health: NA Equipment/Devices:NA  Discharge Condition: Stable CODE STATUS: Full Code Diet recommendation: Diet renal/carb modified with fluid restriction Diet-HS Snack? Nothing; Room service appropriate? Yes; Fluid consistency: Thin Diet - low sodium heart healthy  Brief/Interim Summary: Willie Mitchellis a 66 y.o.malewith medical history significant for ESRD on HD T TH S , Hep B, tobacco abuse, Irreg heart beat s/p pacer placement with recent interrogation and change of battery, presenting to the ED after experiencing an episode of prey syncope prior to HD. He reports never having similar symptoms. The was no loss of consciousness. He reported diaphoresis and lightheadedness. He denies any headaches nausea vomiting, or falling. He denies any syncope or seizures. No confusion was reported. The patient did not heed his head on the ground. He denies any worsening shortness of breath or chest pain. He continues to feel very "unwell". He denies any abdominal pain,pelvicpain, or lower extremity swelling. His last dialysis was 2 days ago. He makes a small amount of urine. He denies any recent infections. He took his morning meds   Discharge Diagnoses:  Active Problems:   ESRD (end stage renal disease) (HCC)   Pacemaker   Chronic systolic heart failure (HCC)   Pre-syncope   Presyncope  -Labs, EKG unrevealing. Neuro exam unremarkable.  -2 d echo 09/2016 with severely reduced systolic function, The EF15% to 20%. grade 3 diastolic dysfunction .  -EKG paced rhythm without ACS,  -I curb sided cardiology, pacemaker interrogated, no recent arrhythmias -2-D echo  ordered initially, canceled because of recent 2-D echo on 10/27/2016. -Per pharmacy patient was not feeling his medications lately. -I think his presyncope is likely secondary to low blood pressure, was not taking his midodrine. -Explained to him the importance of that, represcribed all his medications, advised to take it regularly. -No presyncope/dizziness episodes in the hospital.  Hypotension: Unclear cause Septic shock doubted as no source of infection or fever is noted. had BP in the 80s systolic on admission, then improved to the 110s over 70s. WBC normal.  Will review the records as patient with ejection fraction of 15-20%, low LVEF patients tend to have lower blood pressure.  ESRD on HD TThS, Cr 9.24  Nephrology notified, received 2 sessions of dialysis. Renal Diet.  Anemia of chronic diseaseHemoglobin on admission 10.1 at baseline This is likely secondary to his ESRD  Chronic systolic CHFLast 2 D echo as above Weight 152 lbs  -Not able to administer any medications because of low blood pressure. Volume management per dialysis. -Consider to start beta blockers, ace inhibitors and BiDil as outpatient if blood pressure allows. -Patient to follow-up with Willie Andrade of EP cardiology   Discharge Instructions  Discharge Instructions    Diet - low sodium heart healthy    Complete by:  As directed    Increase activity slowly    Complete by:  As directed      Allergies as of 12/23/2016   No Known Allergies     Medication List    TAKE these medications   calcium acetate 667 MG capsule Commonly known as:  PHOSLO Take 3 capsules (2,001 mg total) by mouth 3 (three) times daily with meals.   cinacalcet 30 MG tablet  Commonly known as:  SENSIPAR Take 1 tablet (30 mg total) by mouth daily. Tues / Thurs / Sat   lidocaine-prilocaine cream Commonly known as:  EMLA Apply 1 application topically Every Tuesday,Thursday,and Saturday with dialysis.   midodrine  5 MG tablet Commonly known as:  PROAMATINE Take 1 tablet (5 mg total) by mouth 2 (two) times daily.      Follow-up Information    Willie Lemming, MD Follow up on 01/22/2017.   Specialty:  Cardiology Why:  Please arrive at 10:30 am for a 10:45 appt. Contact information: [address] [place] [address] [phone]          No Known Allergies  Consultations:  Treatment Team:   Willie Miss, MD   Procedures (Echo, Carotid, EGD, Colonoscopy, ERCP)   Radiological studies: X-ray Chest Pa And Lateral  Result Date: 12/22/2016 CLINICAL DATA:  Presyncope EXAM: CHEST  2 VIEW COMPARISON:  11/27/2016 FINDINGS: Right AICD remains in place, unchanged. Cardiomegaly. Patchy airspace opacity in the right lower lobe. Left lung is clear. No effusions or acute bony abnormality. IMPRESSION: Cardiomegaly. Patchy opacity in the right lower lobe.  Cannot exclude pneumonia. Electronically Signed   By: Willie Andrade M.D.   On: 12/22/2016 09:24   Dg Chest 2 View  Result Date: 11/27/2016 CLINICAL DATA:  Evaluate RIGHT atrial lead prior to ICD generator change EXAM: CHEST  2 VIEW COMPARISON:  08/30/2016 FINDINGS: RIGHT subclavian AICD with stable appearance of the RIGHT ventricular and coronary sinus leads since previous exam. RIGHT anteriorly lead projects over the cranial aspect of the cardiac silhouette, probably little change when accounting for slight rotation to the LEFT (decrease in distance from the RIGHT mediastinal border to the generator). Atherosclerotic calcification aorta. Upper normal heart size with pulmonary vascularity normal. Minimal chronic interstitial changes and underlying emphysematous changes in the lungs again seen. No definite acute infiltrate, pleural effusion or pneumothorax. Mild central peribronchial thickening again noted. Bones unremarkable. IMPRESSION: Grossly stable in AICD leads when accounting for slight rotation. Changes of COPD and RIGHT basilar  scarring without acute infiltrate. Electronically Signed   By: Willie Andrade M.D.   On: 11/27/2016 11:41     Subjective:  Discharge Exam: Vitals:   12/23/16 0830 12/23/16 0900 12/23/16 0930 12/23/16 1000  BP: (!) 105/55 120/70 (!) 100/54 120/71  Pulse: 96 95 95 92  Resp:      Temp:      TempSrc:      SpO2:      Weight:      Height:       General: Pt is alert, awake, not in acute distress Cardiovascular: RRR, S1/S2 +, no rubs, no gallops Respiratory: CTA bilaterally, no wheezing, no rhonchi Abdominal: Soft, NT, ND, bowel sounds + Extremities: no edema, no cyanosis   The results of significant diagnostics from this hospitalization (including imaging, microbiology, ancillary and laboratory) are listed below for reference.    Microbiology: Recent Results (from the past 240 hour(s))  Culture, blood (Routine X 2) w Reflex to ID Panel     Status: None (Preliminary result)   Collection Time: 12/21/16  8:02 PM  Result Value Ref Range Status   Specimen Description BLOOD RIGHT HAND  Final   Special Requests IN PEDIATRIC BOTTLE Blood Culture adequate volume  Final   Culture NO GROWTH < 24 HOURS  Final   Report Status PENDING  Incomplete  Culture, blood (Routine X 2) w Reflex to ID Panel  Status: None (Preliminary result)   Collection Time: 12/21/16  8:41 PM  Result Value Ref Range Status   Specimen Description BLOOD RIGHT ANTECUBITAL  Final   Special Requests   Final    BOTTLES DRAWN AEROBIC AND ANAEROBIC Blood Culture adequate volume   Culture NO GROWTH < 24 HOURS  Final   Report Status PENDING  Incomplete     Labs: BNP (last 3 results) No results for input(s): BNP in the last 8760 hours. Basic Metabolic Panel:  Recent Labs Lab 12/21/16 1355 12/21/16 2226 12/22/16 1135 12/23/16 0157  NA 139  --  137 135  K 3.5  --  3.8 4.5  CL 96*  --  97* 95*  CO2 29  --  24 25  GLUCOSE 80  --  91 106*  BUN 37*  --  46* 21*  CREATININE 9.24*  --  11.03* 6.61*  CALCIUM 8.8*  --   8.1* 8.6*  MG  --  1.9  --   --    Liver Function Tests:  Recent Labs Lab 12/22/16 1135  AST 14*  ALT 8*  ALKPHOS 91  BILITOT 0.6  PROT 6.3*  ALBUMIN 3.1*   No results for input(s): LIPASE, AMYLASE in the last 168 hours. No results for input(s): AMMONIA in the last 168 hours. CBC:  Recent Labs Lab 12/21/16 1355 12/22/16 1135 12/23/16 0228  WBC 5.8 6.0 7.7  HGB 10.1* 10.0* 10.5*  HCT 33.6* 32.8* 35.3*  MCV 91.6 90.9 92.4  PLT 161 172 164   Cardiac Enzymes:  Recent Labs Lab 12/21/16 2007 12/22/16 1517 12/22/16 2006 12/23/16 0157  TROPONINI 0.04* 0.04* 0.03* 0.03*   BNP: Invalid input(s): POCBNP CBG:  Recent Labs Lab 12/22/16 0638  GLUCAP 87   D-Dimer No results for input(s): DDIMER in the last 72 hours. Hgb A1c No results for input(s): HGBA1C in the last 72 hours. Lipid Profile No results for input(s): CHOL, HDL, LDLCALC, TRIG, CHOLHDL, LDLDIRECT in the last 72 hours. Thyroid function studies No results for input(s): TSH, T4TOTAL, T3FREE, THYROIDAB in the last 72 hours.  Invalid input(s): FREET3 Anemia work up No results for input(s): VITAMINB12, FOLATE, FERRITIN, TIBC, IRON, RETICCTPCT in the last 72 hours. Urinalysis No results found for: COLORURINE, APPEARANCEUR, LABSPEC, PHURINE, GLUCOSEU, HGBUR, BILIRUBINUR, KETONESUR, PROTEINUR, UROBILINOGEN, NITRITE, LEUKOCYTESUR Sepsis Labs Invalid input(s): PROCALCITONIN,  WBC,  LACTICIDVEN Microbiology Recent Results (from the past 240 hour(s))  Culture, blood (Routine X 2) w Reflex to ID Panel     Status: None (Preliminary result)   Collection Time: 12/21/16  8:02 PM  Result Value Ref Range Status   Specimen Description BLOOD RIGHT HAND  Final   Special Requests IN PEDIATRIC BOTTLE Blood Culture adequate volume  Final   Culture NO GROWTH < 24 HOURS  Final   Report Status PENDING  Incomplete  Culture, blood (Routine X 2) w Reflex to ID Panel     Status: None (Preliminary result)   Collection Time:  12/21/16  8:41 PM  Result Value Ref Range Status   Specimen Description BLOOD RIGHT ANTECUBITAL  Final   Special Requests   Final    BOTTLES DRAWN AEROBIC AND ANAEROBIC Blood Culture adequate volume   Culture NO GROWTH < 24 HOURS  Final   Report Status PENDING  Incomplete     Time coordinating discharge: Over 30 minutes  SIGNED:   Clint Lipps, MD  [facility] 12/23/2016, 10:48 AM Pager   If 7PM-7AM, please contact night-coverage [email or web address] Password [email or web address]

## 2016-12-23 NOTE — Progress Notes (Signed)
Pt verbally abusive and refuses care at times. Will continue to monitor.

## 2016-12-23 NOTE — Progress Notes (Signed)
HD Note I HAVE PERSONALLY ATTENDED THIS PT'S HD SESSION HE IS AT [facility] SO NO VOLUME TO BE REMOVED 2K BATH FOR K 2 L AVF 400 NO ISSUES WITH CANNULATION Camille Bal, MD [facility] [phone] Pager 12/23/2016, 7:56 AM  [facility] Rounding Note  Subjective/Interval History:  On HD again today to get back on schedule AT [facility] so no volume off BP remains soft Midodrine given pre HD  Objective Vital signs in last 24 hours: Vitals:   12/22/16 2022 12/23/16 0504 12/23/16 0659 12/23/16 0716  BP: 99/60  109/68 107/75  Pulse: 92  97 94  Resp: 14  (!) 28   Temp: 97.6 F (36.4 C)  97.9 F (36.6 C)   TempSrc: Oral  Oral   SpO2: 100%  99%   Weight:  67.9 kg (149 lb 11.2 oz) 67.9 kg (149 lb 11.1 oz)   Height:       Weight change: 0.7 kg (1 lb 8.7 oz)  Intake/Output Summary (Last 24 hours) at 12/23/16 0739 Last data filed at 12/22/16 2238  Gross per 24 hour  Intake              723 ml  Output             -270 ml  Net              993 ml   Physical Exam:  Blood pressure 107/75, pulse 94, temperature 97.9 F (36.6 C), temperature source Oral, resp. rate (!) 28, height 6\' 2"  (1.88 m), weight 67.9 kg (149 lb 11.1 oz), SpO2 99 %.   Thin frail appearing AAM NAD Appropriate and polite in HD this AM  No JVD No masses Scaly dry rash on shoulders  Pacer R chest not tender Breathing is unlabored. S1S2 No S3 frequent extrasystoles. Soft NT abdomen Lower extremities:without edema or ischemic changes, no open wounds  . Dialysis Access: LUE AVF sm aneurysms +bruit currently cannulated   Recent Labs Lab 12/21/16 1355 12/22/16 1135 12/23/16 0157  NA 139 137 135  K 3.5 3.8 4.5  CL 96* 97* 95*  CO2 29 24 25   GLUCOSE 80 91 106*  BUN 37* 46* 21*  CREATININE 9.24* 11.03* 6.61*  CALCIUM 8.8* 8.1* 8.6*    Recent Labs Lab 12/22/16 1135  AST 14*  ALT 8*  ALKPHOS 91  BILITOT 0.6  PROT 6.3*  ALBUMIN 3.1*    Recent Labs Lab 12/21/16 1355 12/22/16 1135 12/23/16 0228   WBC 5.8 6.0 7.7  HGB 10.1* 10.0* 10.5*  HCT 33.6* 32.8* 35.3*  MCV 91.6 90.9 92.4  PLT 161 172 164    Recent Labs Lab 12/21/16 2007 12/22/16 1517 12/22/16 2006 12/23/16 0157  TROPONINI 0.04* 0.04* 0.03* 0.03*    Recent Labs Lab 12/22/16 0638  GLUCAP 87   5/24 blood cultures negative to date  Studies/Results: X-ray Chest Pa And Lateral  Result Date: 12/22/2016 CLINICAL DATA:  Presyncope EXAM: CHEST  2 VIEW COMPARISON:  11/27/2016 FINDINGS: Right AICD remains in place, unchanged. Cardiomegaly. Patchy airspace opacity in the right lower lobe. Left lung is clear. No effusions or acute bony abnormality. IMPRESSION: Cardiomegaly. Patchy opacity in the right lower lobe.  Cannot exclude pneumonia. Electronically Signed   By: Charlett Nose M.D.   On: 12/22/2016 09:24   Medications:  . midodrine      . pentafluoroprop-tetrafluoroeth      . calcitRIOL  1 mcg Oral Q T,Th,Sa-HD  . cinacalcet  120 mg Oral Q T,Th,Sat-1800  .  heparin  5,000 Units Subcutaneous Q8H  . lidocaine-prilocaine  1 application Topical Q T,Th,Sa-HD  . midodrine  5 mg Oral BID  . multivitamin  1 tablet Oral QHS  . sevelamer carbonate  3,200 mg Oral TID WC  . sodium chloride flush  3 mL Intravenous Q12H   Dialysis Orders:  [facility] TTS  3h  180F  BFR 400/800  2K/2Ca [facility] 68 kg L AVF  Heparin 1400 -Calcitriol 1 mcg PO q HD -Venofer 50 mg IV q week -Mircera 75 mcg IV q 2 weeks  (last dose 5/22) -Sensipar 120 mg PO q HD Renvela 4 q ac   Assessment/Plan: 1.  Dizziness/Presyncope - occurred while waiting for HD PTA - Echo pending (09/2016 EF 15-20% w/Gr3 DD) ECG paced. Midodrine for low BP. BB on hold. No further syncope since admission. Infection unlikely - Cultures neg to date. WBC normal) 2. Chronic sCHF - await repeat ECHO. Last EF 15-20 3. AICD/PPM - device interrogated by cards. No arrhythmia within PTA.  4.  ESRD -  TTS - HD yesterday and again today to get back on schedule 5.  Anemia  - Hgb 10.5  on ESA  6.  Metabolic bone disease -  Cont VDRA/Sensipar/Renvela binder  7.  Nutrition - Renal diet/vitamins   Camille Bal, MD [facility] [phone] Pager 12/23/2016, 7:41 AM

## 2016-12-23 NOTE — Progress Notes (Signed)
PT Cancellation Note  Patient Details Name: Willie Andrade MRN: [id number] DOB: 1951-06-21   Cancelled Treatment:    Reason Eval/Treat Not Completed: Patient declined, no reason specified;PT screened, no needs identified, will sign off. Pt reports the he gets around fine. He doesn't need any help for mobility and is able to do for himself. No PT needs identified at this time. PT will sign off. Please re-order therapy if any changes arise.    Colin Broach PT, DPT  [phone]  12/23/2016, 2:45 PM

## 2016-12-23 NOTE — Progress Notes (Signed)
Discharge instructions reviewed with patient, iv removed as well as tele, patient stable, patient to follow up in 1 wk is PCP Stanford Breed RN 3:02 PM 12-23-2016

## 2016-12-23 NOTE — Progress Notes (Signed)
PT Cancellation Note  Patient Details Name: Aithan Baccam MRN: [id number] DOB: [birth year]   Cancelled Treatment:    Reason Eval/Treat Not Completed: Patient at procedure or test/unavailable. Pt currently in dialysis. Will check back later as time allows.    Colin Broach PT, DPT  [phone]  12/23/2016, 7:23 AM

## 2016-12-26 LAB — CULTURE, BLOOD (ROUTINE X 2)
CULTURE: NO GROWTH
Culture: NO GROWTH
SPECIAL REQUESTS: ADEQUATE
SPECIAL REQUESTS: ADEQUATE

## 2017-01-03 ENCOUNTER — Ambulatory Visit (INDEPENDENT_AMBULATORY_CARE_PROVIDER_SITE_OTHER): Payer: Medicare Other | Admitting: Physician Assistant

## 2017-01-03 VITALS — BP 104/64 | HR 84 | Ht 74.0 in | Wt 149.0 lb

## 2017-01-03 DIAGNOSIS — Z95810 Presence of automatic (implantable) cardiac defibrillator: Secondary | ICD-10-CM | POA: Diagnosis not present

## 2017-01-03 DIAGNOSIS — I472 Ventricular tachycardia, unspecified: Secondary | ICD-10-CM

## 2017-01-03 DIAGNOSIS — Z79899 Other long term (current) drug therapy: Secondary | ICD-10-CM

## 2017-01-03 DIAGNOSIS — I428 Other cardiomyopathies: Secondary | ICD-10-CM

## 2017-01-03 NOTE — Patient Instructions (Addendum)
  Medication Instructions:   START TAKING AMIODARONE 200 MG ONCE A DAY   PLEASE TAKE MIDODRINE AS PRESCRIBED   If you need a refill on your cardiac medications before your next appointment, please call your pharmacy.  Labwork:   TSH TODAY    Testing/Procedures: NONE ORDERED  TODAY    Follow-Up: IN ONE MONTH  WITH DR CAMNITZ    Any Other Special Instructions Will Be Listed Below (If Applicable).

## 2017-01-03 NOTE — Progress Notes (Signed)
Cardiology Office Note Date:  01/03/2017  Patient ID:  Willie, Andrade [birth year], MRN [id number] PCP:  [name], MD  Cardiologist:  Dr. Katrinka Blazing Electrophysiologist: Dr. Elberta Fortis    Chief Complaint: post hospital visit  History of Present Illness: Willie Andrade is a 66 y.o. male with history of chronic CHF, hypertensive heart disease with CHF, ESRF on HD, hx reports hypotension associated with HD, CM w/ICD, HTN ?, HLD, prior heavy ETOH abuse, unclear hx pf CAD only recently became established with [facility] in march of this year, his initial visit with Dr. Katrinka Blazing then was still trying to acquire past cardiac records from [facility]  Though patient seemed to be compensated and without active c/o.  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  He last (and first) saw Dr. Elberta Fortis for device management.  AT that visit device interrogation disclosed VF episode (VT in the VF zone) in January he was shocked for and a VT episode that did not require tx. He was instructed no driving and if more arrhythmia would plan to start amiodarone in the future.  He had gen change last month.  He was admitted to [facility] for an episode of near syncope, discharged 12/23/16, record indicates his device was interrogated and no arrhythmias noted that correlated with the event (though mentions he dd have an arrhythmia though not that timed with the event, the note was not more specific), ultimately felt 2/2 hypotension being off his midodrine.  His BB, ACE and Bidil were all held 2/2 to low BP, initially SBP 80s that did improve, unclear etiologyof his low BP.  The patient comes in today with c/o DOE.  States he wants to be able walk 5 miles per day but can barely walk a bock because her feels weak and out of breath.  He denies any recurrent near syncope or syncope, no sensation of palpitations, no rest SOB.  He denies CP, reports this going back about 6 weeks or so.  Maybe since his  generator change.  When I asked about driving he gets a little annoyed saying the doctors in [place]. Took his license away because he was unsafe to drive, this he refers back to weak spells and being unsafe, says they accused him every time he went that all he wanted to do was get high and were "covering themselves".  It is unclear if this is his weakness like now.  He reports he has had a weak heart for a long time, but in general thinks his SOB worse of late.  He denies cough, fever, or symptoms of illness.  He has midodrine 5mg  BID prescribed but isnt taking it, says when he was his BP was 120 and they told him it was too high, and on occasion was 150 so he was only taking it prior to dialysis.  I have trouble following his time line, he refers to his time living in [place] as well with some of his symptoms.   I observed him ambulate down the hall to the lab, he did not seem to have difficulty or appear winded with that distance at least.    Device information: SJM CRT-D, gen change 11/28/16, leads from 2010, Dr. Elberta Fortis + hx of appropriate tx, jan 2018 No AAD hx   Past Medical History:  Diagnosis Date  . Chronic systolic CHF (congestive heart failure), NYHA class 2 (HCC)   . ESRD (end stage renal disease) on dialysis (HCC)   .  Hepatitis B   . HTN (hypertension)   . Hyperlipidemia   . 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 Tobacco abuse     Past Surgical History:  Procedure Laterality Date  . BIV ICD GENERATOR CHANGEOUT N/A 11/28/2016   Procedure: BiV ICD QUALCOMM; St Jude Surgeon: Will Jorja Loa, MD;  Location[facility] INV[facility] CV [facility];  Service: Cardiovascular;  Laterality: N/A;    Current Outpatient Prescriptions  Medication Sig Dispense Refill  . calcium acetate (PHOSLO) 667 MG capsule Take 3 capsules (2,001 mg total) by mouth 3 (three) times daily with meals. 270 capsule 2  . cinacalcet (SENSIPAR) 30 MG tablet Take 1  tablet (30 mg total) by mouth daily. Tues / Thurs / Sat 60 tablet 2  . gabapentin (NEURONTIN) 100 MG capsule Take 100 mg by mouth 2 (two) times daily.     Marland Kitchen lidocaine-prilocaine (EMLA) cream Apply 1 application topically Every Tuesday,Thursday,and Saturday with dialysis.    . midodrine (PROAMATINE) 5 MG tablet Take 1 tablet (5 mg total) by mouth 2 (two) times daily. 30 tablet 6   No current facility-administered medications for this visit.     Allergies:   Patient has no known allergies.   Social History:  The patient  reports that he has been smoking Cigarettes.  He has been smoking about 0.00 packs per day. He has never used smokeless tobacco. He reports that he does not drink alcohol or use drugs.   Family History:  The patient's family history includes Asthma in his brother; Diabetes in his mother; Gout in his maternal grandmother, mother, and sister; Stroke in his father.  ROS:  Please see the history of present illness.    All other systems are reviewed and otherwise negative.   PHYSICAL EXAM:  VS:  BP 104/64   Pulse 84   Ht 6\' 2"  (1.88 m)   Wt 149 lb (67.6 kg)   BMI 19.13 kg/m  BMI: Body mass index is 19.13 kg/m. Well nourished though thin body habitus, well developed, in no acute distress  HEENT: normocephalic, atraumatic  Neck: no JVD, carotid bruits or masses Cardiac:  RRR; no significant murmurs, no rubs, or gallops Lungs:  CTA b/l, no wheezing, rhonchi or rales  Abd: soft, nontender MS: no deformity or atrophy Ext: no edema  Skin: warm and dry, no rash Neuro:  No gross deficits appreciated Psych: euthymic mood, full affect  ICD site is stable, no tethering or discomfort, is well healed.   EKG:  12/21/16 SR, V paced ICD interrogation done today and reviewed by myself: battery and lead measurements are good.  He had an 11 VT episode (post hospital stay) 162bpm in the monitor zone, and a NSVT. (none correlate with his near syncope/syncope that he was admitted  with)  12/23/16: TTE Study Conclusions - Left ventricle: The cavity size was moderately dilated. Systolic   function was severely reduced. The estimated ejection fraction   was in the range of 15% to 20%. Diffuse hypokinesis. - Ventricular septum: Septal motion showed paradox. - Aortic valve: There was mild regurgitation. - Ascending aorta: The ascending aorta was normal in size. - Mitral valve: There was mild regurgitation. - Left atrium: The atrium was mildly dilated. - Right ventricle: The cavity size was moderately dilated. Wall   thickness was normal. Pacer wire or catheter noted in right   ventricle. Systolic function was moderately reduced. - Right atrium: Pacer wire or catheter noted  in right atrium. - Tricuspid valve: There was severe regurgitation. - Pulmonary arteries: Systolic pressure was moderately increased.   PA peak pressure: 50 mm Hg (S). - Inferior vena cava: The vessel was dilated. The respirophasic   diameter changes were blunted (< 50%), consistent with elevated   central venous pressure. - Pericardium, extracardiac: There was no pericardial effusion. Impressions - No significant change from the prior study on 10/27/2016.  Recent Labs: 12/21/2016: Magnesium 1.9 12/22/2016: ALT 8 12/23/2016: BUN 21; Creatinine, Ser 6.61; Hemoglobin 10.5; Platelets 164; Potassium 4.5; Sodium 135  No results found for requested labs within last 8760 hours.   Estimated Creatinine Clearance: 10.5 mL/min (A) (by C-G formula based on SCr of 6.61 mg/dL (H)).   Wt Readings from Last 3 Encounters:  01/03/17 149 lb (67.6 kg)  12/23/16 149 lb 11.1 oz (67.9 kg)  11/28/16 149 lb 14.6 oz (68 kg)     Other studies reviewed: Additional studies/records reviewed today include: summarized above  ASSESSMENT AND PLAN:  1. Near syncope     Felt to have been hypotension related     BP today appears similar to his last visits here.     He is not taking the Midodrine daily, recommend he start  it BID as prescibed, he will be getting his BP monitored at HD tomorrow and 3x week.  If he has reports of HTN he is asked to call and let us know.  2. CM, EF 15-20%     His CorVue is at threshold trending up, his exam does not suggest fluid OL     Fluid status managed with dialysis     BP limits ability to treat  3. ICD     Normal function, no changes made  4. VT     He has had + VT with therapy in the past with thoughts by dr. Gershon Crane note to start amiodarone if more VT     He had 11 seconds of VT though in his monitor zone.     Will go ahead and start amiodarone 200mg  daily and check TSH for baseline. LFTs looked OK recently   Disposition: F/u with Dr. Elberta Fortis as scheduled in 3 weeks, sooner if needed.  Current medicines are reviewed at length with the patient today.  The patient did not have any concerns regarding medicines.  Judith Blonder, PA-C 01/03/2017 3:07 PM     [facility] [address] [place] [address] [phone] (office)  [phone] (fax)

## 2017-01-04 LAB — TSH: TSH: 1.75 u[IU]/mL (ref 0.450–4.500)

## 2017-01-16 ENCOUNTER — Encounter (HOSPITAL_COMMUNITY): Payer: Self-pay

## 2017-01-16 ENCOUNTER — Inpatient Hospital Stay (HOSPITAL_COMMUNITY)
Admission: EM | Admit: 2017-01-16 | Discharge: 2017-01-26 | DRG: 252 | Disposition: A | Discharge status: Home / Self Care | Payer: Medicare Other | Attending: Internal Medicine | Admitting: Internal Medicine

## 2017-01-16 ENCOUNTER — Emergency Department (HOSPITAL_COMMUNITY): Payer: Medicare Other

## 2017-01-16 DIAGNOSIS — N186 End stage renal disease: Secondary | ICD-10-CM | POA: Diagnosis present

## 2017-01-16 DIAGNOSIS — Z992 Dependence on renal dialysis: Secondary | ICD-10-CM

## 2017-01-16 DIAGNOSIS — B1910 Unspecified viral hepatitis B without hepatic coma: Secondary | ICD-10-CM | POA: Diagnosis present

## 2017-01-16 DIAGNOSIS — L03116 Cellulitis of left lower limb: Secondary | ICD-10-CM

## 2017-01-16 DIAGNOSIS — E8889 Other specified metabolic disorders: Secondary | ICD-10-CM | POA: Diagnosis present

## 2017-01-16 DIAGNOSIS — I96 Gangrene, not elsewhere classified: Secondary | ICD-10-CM | POA: Diagnosis not present

## 2017-01-16 DIAGNOSIS — Z95828 Presence of other vascular implants and grafts: Secondary | ICD-10-CM

## 2017-01-16 DIAGNOSIS — D631 Anemia in chronic kidney disease: Secondary | ICD-10-CM | POA: Diagnosis present

## 2017-01-16 DIAGNOSIS — L97529 Non-pressure chronic ulcer of other part of left foot with unspecified severity: Secondary | ICD-10-CM | POA: Diagnosis present

## 2017-01-16 DIAGNOSIS — Z79899 Other long term (current) drug therapy: Secondary | ICD-10-CM

## 2017-01-16 DIAGNOSIS — L0390 Cellulitis, unspecified: Secondary | ICD-10-CM

## 2017-01-16 DIAGNOSIS — I132 Hypertensive heart and chronic kidney disease with heart failure and with stage 5 chronic kidney disease, or end stage renal disease: Secondary | ICD-10-CM | POA: Diagnosis present

## 2017-01-16 DIAGNOSIS — Z95810 Presence of automatic (implantable) cardiac defibrillator: Secondary | ICD-10-CM

## 2017-01-16 DIAGNOSIS — E785 Hyperlipidemia, unspecified: Secondary | ICD-10-CM | POA: Diagnosis present

## 2017-01-16 DIAGNOSIS — I5022 Chronic systolic (congestive) heart failure: Secondary | ICD-10-CM | POA: Diagnosis present

## 2017-01-16 DIAGNOSIS — F17210 Nicotine dependence, cigarettes, uncomplicated: Secondary | ICD-10-CM | POA: Diagnosis present

## 2017-01-16 DIAGNOSIS — D62 Acute posthemorrhagic anemia: Secondary | ICD-10-CM | POA: Diagnosis present

## 2017-01-16 DIAGNOSIS — I429 Cardiomyopathy, unspecified: Secondary | ICD-10-CM | POA: Diagnosis present

## 2017-01-16 DIAGNOSIS — N2581 Secondary hyperparathyroidism of renal origin: Secondary | ICD-10-CM | POA: Diagnosis present

## 2017-01-16 LAB — CBC
HCT: 37.2 % — ABNORMAL LOW (ref 39.0–52.0)
HEMOGLOBIN: 11.6 g/dL — AB (ref 13.0–17.0)
MCH: 27.8 pg (ref 26.0–34.0)
MCHC: 31.2 g/dL (ref 30.0–36.0)
MCV: 89.2 fL (ref 78.0–100.0)
Platelets: 310 10*3/uL (ref 150–400)
RBC: 4.17 MIL/uL — AB (ref 4.22–5.81)
RDW: 17.7 % — ABNORMAL HIGH (ref 11.5–15.5)
WBC: 8 10*3/uL (ref 4.0–10.5)

## 2017-01-16 LAB — BASIC METABOLIC PANEL
ANION GAP: 14 (ref 5–15)
BUN: 17 mg/dL (ref 6–20)
CALCIUM: 7.8 mg/dL — AB (ref 8.9–10.3)
CO2: 30 mmol/L (ref 22–32)
Chloride: 95 mmol/L — ABNORMAL LOW (ref 101–111)
Creatinine, Ser: 4.72 mg/dL — ABNORMAL HIGH (ref 0.61–1.24)
GFR, EST AFRICAN AMERICAN: 14 mL/min — AB (ref >60)
GFR, EST NON AFRICAN AMERICAN: 12 mL/min — AB (ref >60)
Glucose, Bld: 83 mg/dL (ref 65–99)
Potassium: 3.4 mmol/L — ABNORMAL LOW (ref 3.5–5.1)
Sodium: 139 mmol/L (ref 135–145)

## 2017-01-16 MED ORDER — OXYCODONE-ACETAMINOPHEN 5-325 MG PO TABS
2.0000 | ORAL_TABLET | Freq: Once | ORAL | Status: AC
  Administered 2017-01-16: 2 via ORAL
  Filled 2017-01-16: qty 2

## 2017-01-16 NOTE — ED Provider Notes (Signed)
[facility]EM[facility]Y DEPT Provider Note   CSN: [id number] Arrival date & time: 01/16/17  1629     History   Chief Complaint Chief Complaint  Patient presents with  . Leg Swelling    HPI Willie Andrade is a 66 y.o. male.  Patient with a history of ESRD-HD, Hepatitis B, CHF, HTN, HLD, presents with complaint of left foot pain involving the toes for the past week. He states he lost the toe nail during the week and there has been significant drainage and malodor. Today while at dialysis he noticed significant swelling of the entire foot prompting evaluation in the emergency room. No fever, nausea.   The history is provided by the patient. No language interpreter was used.    Past Medical History:  Diagnosis Date  . Chronic systolic CHF (congestive heart failure), NYHA class 2 (HCC)   . ESRD (end stage renal disease) on dialysis (HCC)   . Hepatitis B   . HTN (hypertension)   . Hyperlipidemia   . 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 Tobacco abuse     Patient Active Problem List   Diagnosis Date Noted  . Pre-syncope 12/21/2016  . Chronic systolic heart failure (HCC) 10/11/2016  . 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 Pacemaker 10/10/2016  . ESRD (end stage renal disease) (HCC) 02/14/2016    Past Surgical History:  Procedure Laterality Date  . BIV ICD GENERATOR CHANGEOUT N/A 11/28/2016   Procedure: BiV ICD [employer]; St Jude Surgeon: Will Jorja Loa, MD;  Location: [facility] INVAS[facility] CV [facility];  Service: Cardiovascular;  Laterality: N/A;       Home Medications    Prior to Admission medications   Medication Sig Start Date End Date Taking? Authorizing Provider  calcium acetate (PHOSLO) 667  MG capsule Take 3 capsules (2,001 mg total) by mouth 3 (three) times daily with meals. 12/23/16  Yes Clydia Llano, MD  cinacalcet (SENSIPAR) 30 MG tablet Take 1 tablet (30 mg total) by mouth daily. Tues / Thurs / Sat 12/23/16  Yes Clydia Llano, MD  gabapentin (NEURONTIN) 100 MG capsule Take 100 mg by mouth 2 (two) times daily.    Yes [provider]  midodrine (PROAMATINE) 5 MG tablet Take 1 tablet (5 mg total) by mouth 2 (two) times daily. 12/23/16  Yes Clydia Llano, MD  lidocaine-prilocaine (EMLA) cream Apply 1 application topically Every Tuesday,Thursday,and Saturday with dialysis.    [provider]    Family History Family History  Problem Relation Age of Onset  . Diabetes Mother   . Gout Mother   . Stroke Father   . Gout Maternal Grandmother   . Gout Sister   . Asthma Brother     Social History Social History  Substance Use Topics  . Smoking status: Current Every Day Smoker    Packs/day: 0.00    Types: Cigarettes  . Smokeless tobacco: Never Used  . Alcohol use No     Allergies   Patient has no known allergies.   Review of Systems Review of Systems  Constitutional: Negative for fever.  Gastrointestinal: Negative for nausea.  Musculoskeletal:       See HPI.  Skin: Positive for wound.     Physical Exam Updated Vital Signs BP Marland Kitchen)  92/58 (BP Location: Right Arm)   Pulse 81   Temp 97.8 F (36.6 C) (Oral)   Resp 14   Ht 6\' 2"  (1.88 m)   Wt 67.6 kg (149 lb)   SpO2 99%   BMI 19.13 kg/m   Physical Exam  Constitutional: He is oriented to person, place, and time. He appears well-developed and well-nourished.  Neck: Normal range of motion.  Pulmonary/Chest: Effort normal.  Musculoskeletal: Normal range of motion.  Left foot is moderately swollen over dorsum. Nontender forefoot. There is swelling of the 2nd-5th toes and significant tenderness. There is bleeding associated with toenails of the 2nd and 4th toes. The third toenail is absent and there  is malodorous purulent drainage distally.  Neurological: He is alert and oriented to person, place, and time.  Skin: Skin is warm and dry.  Psychiatric: He has a normal mood and affect.     ED Treatments / Results  Labs (all labs ordered are listed, but only abnormal results are displayed) Labs Reviewed  BASIC METABOLIC PANEL - Abnormal; Notable for the following:       Result Value   Potassium 3.4 (*)    Chloride 95 (*)    Creatinine, Ser 4.72 (*)    Calcium 7.8 (*)    GFR calc non Af Amer 12 (*)    GFR calc Af Amer 14 (*)    All other components within normal limits  CBC - Abnormal; Notable for the following:    RBC 4.17 (*)    Hemoglobin 11.6 (*)    HCT 37.2 (*)    RDW 17.7 (*)    All other components within normal limits    EKG  EKG Interpretation None       Radiology No results found.  Procedures Procedures (including critical care time)  Medications Ordered in ED Medications  oxyCODONE-acetaminophen (PERCOCET/ROXICET) 5-325 MG per tablet 2 tablet (2 tablets Oral Given 01/16/17 2234)     Initial Impression / Assessment and Plan / ED Course  I have reviewed the triage vital signs and the nursing notes.  Pertinent labs & imaging results that were available during my care of the patient were reviewed by me and considered in my medical decision making (see chart for details).     Patient presents with complaint of left foot swelling and pain. His pain is localized to 3rd toe where he has had drainage and loss of toe nail. No fever.   Lab studies are essentially unremarkable for patient. Blood pressure has been low at 92/58, but this is consistent with previous visits. His pain is improved with Percocet.   X-ray of toes suggest findings concerning for gas producing organism. IV Vancomycin and Zosyn ordered. Discussed admission with the patient who agrees. Ortho paged.   Discussed with Dr. Magnus Ivan (Ortho) who reviewed imaging and advises that he does not  feel the air seen on imaging is from gas producing organism. Ortho will consult in the morning.  Discussed with [facility] who accepts the patient for admission.   Final Clinical Impressions(s) / ED Diagnoses   Final diagnoses:  None   1. Left foot infection New Prescriptions New Prescriptions   No medications on file     Danne Harbor 01/17/17 0119    Mancel Bale, MD 01/17/17 [phone]

## 2017-01-16 NOTE — ED Notes (Signed)
Pt transported to Xray. 

## 2017-01-16 NOTE — ED Triage Notes (Signed)
Per Pt, Pt is coming from dialysis with reports of left foot swelling that started one week ago. Pt has hx of dialysis and CHF. Pt made a MD appt. Scheduled for tomorrow, but he said that he couldn't make it to tomorrow. Left foot is significantly swollen with sore noted on the top of the foot.

## 2017-01-16 NOTE — ED Notes (Signed)
Pt again asked for percocet

## 2017-01-16 NOTE — ED Notes (Signed)
Pt demanding Percocet 10 from this nurse. Stated I could not give him that in triage. Pt requested to call the doctor to get the order. Verbalized I wouldn't be able to do that at this time.

## 2017-01-16 NOTE — Progress Notes (Signed)
Patient not on goal directed therapy due to history of hypotension. BP has limited therapy.  Loman [place], MD

## 2017-01-17 ENCOUNTER — Ambulatory Visit: Payer: Medicare Other | Admitting: Podiatry

## 2017-01-17 ENCOUNTER — Telehealth (INDEPENDENT_AMBULATORY_CARE_PROVIDER_SITE_OTHER): Payer: Self-pay

## 2017-01-17 ENCOUNTER — Encounter (HOSPITAL_COMMUNITY): Payer: Self-pay | Admitting: Internal Medicine

## 2017-01-17 DIAGNOSIS — Y838 Other surgical procedures as the cause of abnormal reaction of the patient, or of later complication, without mention of misadventure at the time of the procedure: Secondary | ICD-10-CM | POA: Diagnosis not present

## 2017-01-17 DIAGNOSIS — I5022 Chronic systolic (congestive) heart failure: Secondary | ICD-10-CM | POA: Diagnosis present

## 2017-01-17 DIAGNOSIS — N186 End stage renal disease: Secondary | ICD-10-CM | POA: Diagnosis present

## 2017-01-17 DIAGNOSIS — Z95810 Presence of automatic (implantable) cardiac defibrillator: Secondary | ICD-10-CM | POA: Diagnosis not present

## 2017-01-17 DIAGNOSIS — B1910 Unspecified viral hepatitis B without hepatic coma: Secondary | ICD-10-CM | POA: Diagnosis present

## 2017-01-17 DIAGNOSIS — I70262 Atherosclerosis of native arteries of extremities with gangrene, left leg: Secondary | ICD-10-CM | POA: Diagnosis not present

## 2017-01-17 DIAGNOSIS — T8130XA Disruption of wound, unspecified, initial encounter: Secondary | ICD-10-CM | POA: Diagnosis not present

## 2017-01-17 DIAGNOSIS — I10 Essential (primary) hypertension: Secondary | ICD-10-CM | POA: Diagnosis not present

## 2017-01-17 DIAGNOSIS — D638 Anemia in other chronic diseases classified elsewhere: Secondary | ICD-10-CM | POA: Diagnosis not present

## 2017-01-17 DIAGNOSIS — R531 Weakness: Secondary | ICD-10-CM | POA: Diagnosis present

## 2017-01-17 DIAGNOSIS — N2581 Secondary hyperparathyroidism of renal origin: Secondary | ICD-10-CM | POA: Diagnosis present

## 2017-01-17 DIAGNOSIS — I132 Hypertensive heart and chronic kidney disease with heart failure and with stage 5 chronic kidney disease, or end stage renal disease: Secondary | ICD-10-CM | POA: Diagnosis present

## 2017-01-17 DIAGNOSIS — L0390 Cellulitis, unspecified: Secondary | ICD-10-CM | POA: Diagnosis present

## 2017-01-17 DIAGNOSIS — I96 Gangrene, not elsewhere classified: Secondary | ICD-10-CM

## 2017-01-17 DIAGNOSIS — E8889 Other specified metabolic disorders: Secondary | ICD-10-CM | POA: Diagnosis present

## 2017-01-17 DIAGNOSIS — D62 Acute posthemorrhagic anemia: Secondary | ICD-10-CM | POA: Diagnosis present

## 2017-01-17 DIAGNOSIS — Y9301 Activity, walking, marching and hiking: Secondary | ICD-10-CM | POA: Diagnosis not present

## 2017-01-17 DIAGNOSIS — L03116 Cellulitis of left lower limb: Secondary | ICD-10-CM | POA: Diagnosis present

## 2017-01-17 DIAGNOSIS — R52 Pain, unspecified: Secondary | ICD-10-CM | POA: Diagnosis not present

## 2017-01-17 DIAGNOSIS — L97529 Non-pressure chronic ulcer of other part of left foot with unspecified severity: Secondary | ICD-10-CM | POA: Diagnosis present

## 2017-01-17 DIAGNOSIS — Z79899 Other long term (current) drug therapy: Secondary | ICD-10-CM | POA: Diagnosis not present

## 2017-01-17 DIAGNOSIS — I953 Hypotension of hemodialysis: Secondary | ICD-10-CM | POA: Diagnosis not present

## 2017-01-17 DIAGNOSIS — T8131XA Disruption of external operation (surgical) wound, not elsewhere classified, initial encounter: Secondary | ICD-10-CM | POA: Diagnosis not present

## 2017-01-17 DIAGNOSIS — W19XXXA Unspecified fall, initial encounter: Secondary | ICD-10-CM | POA: Diagnosis not present

## 2017-01-17 DIAGNOSIS — I429 Cardiomyopathy, unspecified: Secondary | ICD-10-CM | POA: Diagnosis present

## 2017-01-17 DIAGNOSIS — Y92481 Parking lot as the place of occurrence of the external cause: Secondary | ICD-10-CM | POA: Diagnosis not present

## 2017-01-17 DIAGNOSIS — Z992 Dependence on renal dialysis: Secondary | ICD-10-CM | POA: Diagnosis not present

## 2017-01-17 DIAGNOSIS — D631 Anemia in chronic kidney disease: Secondary | ICD-10-CM | POA: Diagnosis present

## 2017-01-17 DIAGNOSIS — D72829 Elevated white blood cell count, unspecified: Secondary | ICD-10-CM | POA: Diagnosis not present

## 2017-01-17 DIAGNOSIS — Y998 Other external cause status: Secondary | ICD-10-CM | POA: Diagnosis not present

## 2017-01-17 DIAGNOSIS — F17210 Nicotine dependence, cigarettes, uncomplicated: Secondary | ICD-10-CM | POA: Diagnosis not present

## 2017-01-17 DIAGNOSIS — I951 Orthostatic hypotension: Secondary | ICD-10-CM | POA: Diagnosis not present

## 2017-01-17 DIAGNOSIS — R2681 Unsteadiness on feet: Secondary | ICD-10-CM | POA: Diagnosis not present

## 2017-01-17 DIAGNOSIS — Z95828 Presence of other vascular implants and grafts: Secondary | ICD-10-CM | POA: Diagnosis not present

## 2017-01-17 DIAGNOSIS — I959 Hypotension, unspecified: Secondary | ICD-10-CM | POA: Diagnosis not present

## 2017-01-17 DIAGNOSIS — E785 Hyperlipidemia, unspecified: Secondary | ICD-10-CM | POA: Diagnosis not present

## 2017-01-17 LAB — CBC
HEMATOCRIT: 38.6 % — AB (ref 39.0–52.0)
HEMOGLOBIN: 11.4 g/dL — AB (ref 13.0–17.0)
MCH: 27 pg (ref 26.0–34.0)
MCHC: 29.5 g/dL — ABNORMAL LOW (ref 30.0–36.0)
MCV: 91.3 fL (ref 78.0–100.0)
PLATELETS: 156 10*3/uL (ref 150–400)
RBC: 4.23 MIL/uL (ref 4.22–5.81)
RDW: 17.7 % — ABNORMAL HIGH (ref 11.5–15.5)
WBC: 5.8 10*3/uL (ref 4.0–10.5)

## 2017-01-17 LAB — BASIC METABOLIC PANEL
ANION GAP: 12 (ref 5–15)
BUN: 22 mg/dL — ABNORMAL HIGH (ref 6–20)
CO2: 32 mmol/L (ref 22–32)
CREATININE: 5.99 mg/dL — AB (ref 0.61–1.24)
Calcium: 7.5 mg/dL — ABNORMAL LOW (ref 8.9–10.3)
Chloride: 97 mmol/L — ABNORMAL LOW (ref 101–111)
GFR calc non Af Amer: 9 mL/min — ABNORMAL LOW (ref >60)
GFR, EST AFRICAN AMERICAN: 10 mL/min — AB (ref >60)
Glucose, Bld: 85 mg/dL (ref 65–99)
POTASSIUM: 3.8 mmol/L (ref 3.5–5.1)
Sodium: 141 mmol/L (ref 135–145)

## 2017-01-17 LAB — URIC ACID: Uric Acid, Serum: 3.6 mg/dL — ABNORMAL LOW (ref 4.4–7.6)

## 2017-01-17 LAB — SEDIMENTATION RATE: Sed Rate: 26 mm/hr — ABNORMAL HIGH (ref 0–16)

## 2017-01-17 MED ORDER — ONDANSETRON HCL 4 MG/2ML IJ SOLN: 4.0000 mg | Freq: Four times a day (QID) | INTRAMUSCULAR | Status: DC | PRN

## 2017-01-17 MED ORDER — ACETAMINOPHEN 650 MG RE SUPP: 650.0000 mg | Freq: Four times a day (QID) | RECTAL | Status: DC | PRN

## 2017-01-17 MED ORDER — VANCOMYCIN HCL IN DEXTROSE 750-5 MG/150ML-% IV SOLN
750.0000 mg | INTRAVENOUS | Status: DC
  Administered 2017-01-18 – 2017-01-25 (×4): 750 mg via INTRAVENOUS
  Filled 2017-01-17 (×4): qty 150

## 2017-01-17 MED ORDER — MIDODRINE HCL 5 MG PO TABS
5.0000 mg | ORAL_TABLET | Freq: Two times a day (BID) | ORAL | Status: DC
  Administered 2017-01-17 – 2017-01-26 (×17): 5 mg via ORAL
  Filled 2017-01-17 (×16): qty 1

## 2017-01-17 MED ORDER — GABAPENTIN 100 MG PO CAPS
100.0000 mg | ORAL_CAPSULE | Freq: Two times a day (BID) | ORAL | Status: DC
  Administered 2017-01-17 – 2017-01-26 (×18): 100 mg via ORAL
  Filled 2017-01-17 (×18): qty 1

## 2017-01-17 MED ORDER — PIPERACILLIN-TAZOBACTAM 3.375 G IVPB
3.3750 g | Freq: Two times a day (BID) | INTRAVENOUS | Status: DC
  Administered 2017-01-17 – 2017-01-26 (×16): 3.375 g via INTRAVENOUS
  Filled 2017-01-17 (×22): qty 50

## 2017-01-17 MED ORDER — ONDANSETRON HCL 4 MG PO TABS: 4.0000 mg | ORAL_TABLET | Freq: Four times a day (QID) | ORAL | Status: DC | PRN

## 2017-01-17 MED ORDER — PIPERACILLIN-TAZOBACTAM 3.375 G IVPB 30 MIN
3.3750 g | Freq: Once | INTRAVENOUS | Status: AC
  Administered 2017-01-17: 3.375 g via INTRAVENOUS
  Filled 2017-01-17: qty 50

## 2017-01-17 MED ORDER — RENA-VITE PO TABS
1.0000 | ORAL_TABLET | Freq: Every day | ORAL | Status: DC
  Administered 2017-01-17 – 2017-01-25 (×9): 1 via ORAL
  Filled 2017-01-17 (×9): qty 1

## 2017-01-17 MED ORDER — NEPRO/CARBSTEADY PO LIQD
237.0000 mL | Freq: Two times a day (BID) | ORAL | Status: DC
  Administered 2017-01-17 – 2017-01-26 (×7): 237 mL via ORAL

## 2017-01-17 MED ORDER — OXYCODONE-ACETAMINOPHEN 5-325 MG PO TABS
1.0000 | ORAL_TABLET | Freq: Four times a day (QID) | ORAL | Status: DC | PRN
  Administered 2017-01-17 – 2017-01-22 (×9): 1 via ORAL
  Administered 2017-01-22: 2 via ORAL
  Filled 2017-01-17: qty 2
  Filled 2017-01-17: qty 1
  Filled 2017-01-17: qty 2
  Filled 2017-01-17: qty 1
  Filled 2017-01-17: qty 2
  Filled 2017-01-17 (×4): qty 1

## 2017-01-17 MED ORDER — SODIUM CHLORIDE 0.9 % IV SOLN
1250.0000 mg | Freq: Once | INTRAVENOUS | Status: AC
  Administered 2017-01-17: 1250 mg via INTRAVENOUS
  Filled 2017-01-17: qty 1250

## 2017-01-17 MED ORDER — CINACALCET HCL 30 MG PO TABS: 30.0000 mg | ORAL_TABLET | ORAL | Status: DC

## 2017-01-17 MED ORDER — VANCOMYCIN HCL IN DEXTROSE 1-5 GM/200ML-% IV SOLN
1000.0000 mg | Freq: Once | INTRAVENOUS | Status: DC
  Filled 2017-01-17: qty 200

## 2017-01-17 MED ORDER — ACETAMINOPHEN 325 MG PO TABS
650.0000 mg | ORAL_TABLET | Freq: Four times a day (QID) | ORAL | Status: DC | PRN
  Administered 2017-01-20: 650 mg via ORAL

## 2017-01-17 MED ORDER — CINACALCET HCL 30 MG PO TABS
180.0000 mg | ORAL_TABLET | ORAL | Status: DC
  Administered 2017-01-18 – 2017-01-25 (×4): 180 mg via ORAL
  Filled 2017-01-17 (×5): qty 6

## 2017-01-17 MED ORDER — CALCITRIOL 0.5 MCG PO CAPS
1.0000 ug | ORAL_CAPSULE | ORAL | Status: DC
Start: 2017-01-18 — End: 2017-01-26
  Administered 2017-01-18 – 2017-01-23 (×3): 1 ug via ORAL
  Filled 2017-01-17 (×2): qty 4
  Filled 2017-01-17: qty 2

## 2017-01-17 MED ORDER — SEVELAMER CARBONATE 800 MG PO TABS
3200.0000 mg | ORAL_TABLET | Freq: Three times a day (TID) | ORAL | Status: DC
  Administered 2017-01-17 – 2017-01-26 (×16): 3200 mg via ORAL
  Filled 2017-01-17 (×18): qty 4

## 2017-01-17 MED ORDER — CALCIUM ACETATE (PHOS BINDER) 667 MG PO CAPS
2001.0000 mg | ORAL_CAPSULE | Freq: Three times a day (TID) | ORAL | Status: DC
  Administered 2017-01-17 (×2): 2001 mg via ORAL
  Filled 2017-01-17 (×2): qty 3

## 2017-01-17 NOTE — Telephone Encounter (Signed)
Willie Andrade is scheduling surgery for Friday.

## 2017-01-17 NOTE — Progress Notes (Signed)
PROGRESS NOTE                                                                                                                                                                                                             Patient Demographics:    Willie Andrade, is a 66 y.o. male, DOB - 1951-02-18, NIO:[id number]  Admit date - 01/16/2017   Admitting Physician Eduard Clos, MD  Outpatient Primary MD for the patient is [name], MD  LOS - 0   Chief Complaint  Patient presents with  . Leg Swelling       Brief Narrative   This is a no charge note for patient admitted earlier by Dr. Wynona Dove, charts, labs, imaging were reviewed   Subjective:    Willie Andrade today Complains of left foot pain, otherwise denies shortness of breath, cough or chest pain     Assessment  & Plan :    Principal Problem:   Cellulitis of left foot Active Problems:   ESRD (end stage renal disease) (HCC)   Chronic systolic heart failure (HCC)   Cellulitis   Gangrene of toe of left foot (HCC)  Left foot cellulitis/dry gangrene - Continue with broad-spectrum antibiotic coverage, orthopedic consult greatly appreciated, likely will need debridement, amputation. - ABI pending, vascular surgery has seen patient as well, but he likely will need arteriogram to evaluate for circulation, also will need for revascularization. likely will need debridement, amputation.  ESRD - Renal consulted to resume dialysis on TTS schedule  Hypertension - Continue with home medication  Chronic systolic CHF/Nonischemic cardiomyopathy  - Last EF 15-20% 12/23/16, that is post biventricular ICD, volume management with dialysis    Code Status : Full  Family Communication  : None at bedside  Disposition Plan  : Pending further workup  Consults  :  Orthopedic, vascular, renal  Procedures  : None  DVT Prophylaxis  :  Heparin  Lab Results  Component Value Date   PLT 156  01/17/2017    Antibiotics  :   Anti-infectives    Start     Dose/Rate Route Frequency Ordered Stop   01/18/17 1200  vancomycin (VANCOCIN) IVPB 750 mg/150 ml premix     750 mg 150 mL/hr over 60 Minutes Intravenous Every T-Th-Sa (Hemodialysis) 01/17/17 0128     01/17/17 1000  piperacillin-tazobactam (ZOSYN)  IVPB 3.375 g     3.375 g 12.5 mL/hr over 240 Minutes Intravenous Every 12 hours 01/17/17 0128     01/17/17 0130  vancomycin (VANCOCIN) 1,250 mg in sodium chloride 0.9 % 250 mL IVPB     1,250 mg 166.7 mL/hr over 90 Minutes Intravenous  Once 01/17/17 0119 01/17/17 0351   01/17/17 0045  vancomycin (VANCOCIN) IVPB 1000 mg/200 mL premix  Status:  Discontinued     1,000 mg 200 mL/hr over 60 Minutes Intravenous  Once 01/17/17 0042 01/17/17 0118   01/17/17 0045  piperacillin-tazobactam (ZOSYN) IVPB 3.375 g     3.375 g 100 mL/hr over 30 Minutes Intravenous  Once 01/17/17 0042 01/17/17 0138        Objective:   Vitals:   01/17/17 0200 01/17/17 0246 01/17/17 0537 01/17/17 1425  BP: 100/70 100/70 94/62 96/69   Pulse: 78 89 83 86  Resp:  18 18 17   Temp:  97.3 F (36.3 C) 97.5 F (36.4 C) 98.2 F (36.8 C)  TempSrc:  Oral Oral   SpO2: 94% 90% 95% 97%  Weight:   67.6 kg (149 lb)   Height:        Wt Readings from Last 3 Encounters:  01/17/17 67.6 kg (149 lb)  01/03/17 67.6 kg (149 lb)  12/23/16 67.9 kg (149 lb 11.1 oz)     Intake/Output Summary (Last 24 hours) at 01/17/17 1458 Last data filed at 01/17/17 1425  Gross per 24 hour  Intake              360 ml  Output                0 ml  Net              360 ml     Physical Exam  Awake Alert, Oriented X 3.  Symmetrical Chest wall movement, Good air movement bilaterally, CTAB RRR,No Gallops,Rubs or new Murmurs, No Parasternal Heave +ve B.Sounds, Abd Soft, No tenderness,  No rebound - guarding or rigidity. Nonpalpable pulses and popliteal/pedal left extremity, ulceration with dark discoloration of the left third and fourth  toes    Data Review:    CBC  Recent Labs Lab 01/16/17 1642 01/17/17 0720  WBC 8.0 5.8  HGB 11.6* 11.4*  HCT 37.2* 38.6*  PLT 310 156  MCV 89.2 91.3  MCH 27.8 27.0  MCHC 31.2 29.5*  RDW 17.7* 17.7*    Chemistries   Recent Labs Lab 01/16/17 1642 01/17/17 0720  NA 139 141  K 3.4* 3.8  CL 95* 97*  CO2 30 32  GLUCOSE 83 85  BUN 17 22*  CREATININE 4.72* 5.99*  CALCIUM 7.8* 7.5*   ------------------------------------------------------------------------------------------------------------------ No results for input(s): CHOL, HDL, LDLCALC, TRIG, CHOLHDL, LDLDIRECT in the last 72 hours.  No results found for: HGBA1C ------------------------------------------------------------------------------------------------------------------ No results for input(s): TSH, T4TOTAL, T3FREE, THYROIDAB in the last 72 hours.  Invalid input(s): FREET3 ------------------------------------------------------------------------------------------------------------------ No results for input(s): VITAMINB12, FOLATE, FERRITIN, TIBC, IRON, RETICCTPCT in the last 72 hours.  Coagulation profile No results for input(s): INR, PROTIME in the last 168 hours.  No results for input(s): DDIMER in the last 72 hours.  Cardiac Enzymes No results for input(s): CKMB, TROPONINI, MYOGLOBIN in the last 168 hours.  Invalid input(s): CK ------------------------------------------------------------------------------------------------------------------ No results found for: BNP  Inpatient Medications  Scheduled Meds: . [START ON 01/18/2017] calcitRIOL  1 mcg Oral Q T,Th,Sa-HD  . [START ON 01/18/2017] cinacalcet  180 mg Oral Q T,Th,Sat-1800  . feeding  supplement (NEPRO CARB STEADY)  237 mL Oral BID BM  . gabapentin  100 mg Oral BID  . midodrine  5 mg Oral BID WC  . multivitamin  1 tablet Oral QHS  . sevelamer carbonate  3,200 mg Oral TID WC   Continuous Infusions: . piperacillin-tazobactam (ZOSYN)  IV 3.375 g  (01/17/17 1324)  . [START ON 01/18/2017] vancomycin     PRN Meds:.acetaminophen **OR** acetaminophen, ondansetron **OR** ondansetron (ZOFRAN) IV  Micro Results No results found for this or any previous visit (from the past 240 hour(s)).  Radiology Reports X-ray Chest Pa And Lateral  Result Date: 12/22/2016 CLINICAL DATA:  Presyncope EXAM: CHEST  2 VIEW COMPARISON:  11/27/2016 FINDINGS: Right AICD remains in place, unchanged. Cardiomegaly. Patchy airspace opacity in the right lower lobe. Left lung is clear. No effusions or acute bony abnormality. IMPRESSION: Cardiomegaly. Patchy opacity in the right lower lobe.  Cannot exclude pneumonia. Electronically Signed   By: Charlett Nose M.D.   On: 12/22/2016 09:24   Dg Toe 3rd Left  Result Date: 01/17/2017 CLINICAL DATA:  Acute onset of left foot swelling and pain. Open sore at the left third toe. Initial encounter. EXAM: LEFT THIRD TOE COMPARISON:  None. FINDINGS: The pattern of air at the distal left fourth toe raises question for infection with a gas producing organism, with overlying soft tissue disruption. No definite osseous erosions are characterized to suggest osteomyelitis, though it cannot be excluded on the basis of radiograph. Soft tissue swelling is noted about the toes. There is no evidence of fracture or dislocation. No radiopaque foreign bodies are seen. IMPRESSION: 1. Pattern of air at the distal left fourth toe raises question for infection with a gas producing organism, with overlying soft tissue disruption. Soft tissue swelling noted about the toes. 2. No definite osseous erosion seen to suggest osteomyelitis, though it cannot be excluded on the basis of radiograph. These results were called by telephone at the time of interpretation on 01/17/2017 at 12:16 am to [facility], who verbally acknowledged these results. Electronically Signed   By: Roanna Raider M.D.   On: 01/17/2017 00:16    Time Spent in minutes  no charge   [facility],  Mellanie Bejarano M.D on 01/17/2017 at 2:58 PM  Between 7am to 7pm - Pager - [phone]  After 7pm go to [email or web address] - password [facility]  [facility] -  Office  [phone]

## 2017-01-17 NOTE — Telephone Encounter (Signed)
I have not been given any instructions to schedule surgery for Mr. Willie Andrade and I don't see anything on the OR schedule (that maybe Duda scheduled on his own).  I do see where Dr. Lajoyce Corners did a consult on him this am.  In the note it suggests that Vascular should evaluate patient. Maybe they're planning on something?

## 2017-01-17 NOTE — Telephone Encounter (Signed)
I called and advised surgery would be for Friday.

## 2017-01-17 NOTE — Consult Note (Signed)
[facility] Renal Consultation Note    Indication for Consultation:  Management of ESRD/hemodialysis; anemia, hypertension/volume and secondary hyperparathyroidism PCP: Dr. Lowella Bandy Nephrologist: Dr. Marisue Humble  HPI: Willie Andrade is a 66 y.o. male with ESRD on hemodialysis T,Th,S at [facility]. PMH significant for HTN, now hypotension, HLD, hepatitis B, chronic systolic HF with BiV ICD pacemaker, tobacco abuse. He says he stopped smoking cigarettes 3 months ago. Recent adm 05/24-05/26/18 for presyncope, possibly related to failure to take midodrine.   Patient present to ED this AM with 2 week C/O pain/swelling L. Foot. Pt endorse pain, swelling, difficulty ambulating, chills. Says nothing has helped ease the pain. Xray of 3rd L toe shows pattern of air questionable for gas producing with overlying soft tissue disruption with soft tissue swelling. Orthopedics and VVS has been consulted. Labs unremarkable. He has been started on vanc/zosyn per primary.   Currently, he is resting quietly, no pain unless his L foot is touched. He has blackened 2nd, 3rd and 4th toes L foot with mild pedal edema present. There is some dried drainage between 3rd & 4th L toes, no overwhelming odor. No palpable pedal pulses bilaterally. He says he has had issues with R foot swelling as well and does have small ulcer 4th R toe. He denies chest pain, palpitations, SOB, N,V,D abdominal pain, flank pain, bloody or tarry stools, headaches, syncope, vision or hearing changes.   Patient last HD 01/16/17. He left 0.3 kg above OP EDW. He typically doesn't miss treatments, some early sign offs. Minimal gains between treatments.   Past Medical History:  Diagnosis Date  . Chronic systolic CHF (congestive heart failure), NYHA class 2 (HCC)   . ESRD (end stage renal disease) on dialysis (HCC)   . Hepatitis B   . HTN (hypertension)   . Hyperlipidemia   . 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 Tobacco abuse    Past Surgical History:  Procedure Laterality Date  . BIV ICD GENERATOR CHANGEOUT N/A 11/28/2016   Procedure: BiV ICD [employer]; St Jude Surgeon: Will Jorja Loa, MD;  [facility]: [facility] IN[facility] LAB;  Service: Cardiovascular;  Laterality: N/A;   Family History  Problem Relation Age of Onset  . Diabetes Mother   . Gout Mother   . Stroke Father   . Gout Maternal Grandmother   . Gout Sister   . Asthma Brother    Social History:  reports that he has been smoking Cigarettes.  He has been smoking about 0.00 packs per day. He has never used smokeless tobacco. He reports that he does not drink alcohol or use drugs. No Known Allergies Prior to Admission medications   Medication Sig Start Date End Date Taking? Authorizing Provider  calcium acetate (PHOSLO) 667 MG capsule Take 3 capsules (2,001 mg total) by mouth 3 (three) times daily with meals. 12/23/16  Yes Clydia Llano, MD  cinacalcet (SENSIPAR) 30 MG tablet Take 1 tablet (30 mg total) by mouth daily. Tues / Thurs / Sat 12/23/16  Yes Clydia Llano, MD  gabapentin (NEURONTIN) 100 MG capsule Take 100 mg by mouth 2 (two) times daily.    Yes [provider]  midodrine (PROAMATINE) 5 MG tablet Take 1 tablet (5 mg total) by mouth 2 (two) times daily. 12/23/16  Yes Clydia Llano, MD  lidocaine-prilocaine (EMLA) cream Apply 1 application topically Every Tuesday,Thursday,and Saturday with dialysis.    [provider]   Current Facility-Administered  Medications  Medication Dose Route Frequency Provider Last Rate Last Dose  . acetaminophen (TYLENOL) tablet 650 mg  650 mg Oral Q6H PRN Eduard Clos, MD       Or  . acetaminophen (TYLENOL) suppository 650 mg  650 mg Rectal Q6H PRN Eduard Clos, MD      . calcium acetate (PHOSLO) capsule 2,001 mg  2,001 mg Oral TID WC Eduard Clos, MD   2,001 mg at 01/17/17 1324  . [START ON 01/18/2017]  cinacalcet (SENSIPAR) tablet 30 mg  30 mg Oral Q T,Th,Sat-1800 Eduard Clos, MD      . gabapentin (NEURONTIN) capsule 100 mg  100 mg Oral BID Eduard Clos, MD   100 mg at 01/17/17 7829  . midodrine (PROAMATINE) tablet 5 mg  5 mg Oral BID WC Eduard Clos, MD   5 mg at 01/17/17 0904  . ondansetron (ZOFRAN) tablet 4 mg  4 mg Oral Q6H PRN Eduard Clos, MD       Or  . ondansetron [facility]) injection 4 mg  4 mg Intravenous Q6H PRN Eduard Clos, MD      . piperacillin-tazobactam (ZOSYN) IVPB 3.375 g  3.375 g Intravenous Q12H Stevphen Rochester, RPH 12.5 mL/hr at 01/17/17 1324 3.375 g at 01/17/17 1324  . [START ON 01/18/2017] vancomycin (VANCOCIN) IVPB 750 mg/150 ml premix  750 mg Intravenous Q T,Th,Sa-HD Stevphen [facility], [facility]       Labs: Basic Metabolic Panel:  Recent Labs Lab 01/16/17 1642 01/17/17 0720  NA 139 141  K 3.4* 3.8  CL 95* 97*  CO2 30 32  GLUCOSE 83 85  BUN 17 22*  CREATININE 4.72* 5.99*  CALCIUM 7.8* 7.5*   Liver Function Tests: No results for input(s): AST, ALT, ALKPHOS, BILITOT, PROT, ALBUMIN in the last 168 hours. No results for input(s): LIPASE, AMYLASE in the last 168 hours. No results for input(s): AMMONIA in the last 168 hours. CBC:  Recent Labs Lab 01/16/17 1642 01/17/17 0720  WBC 8.0 5.8  HGB 11.6* 11.4*  HCT 37.2* 38.6*  MCV 89.2 91.3  PLT 310 156   Dg Toe 3rd Left  Result Date: 01/17/2017 CLINICAL DATA:  Acute onset of left foot swelling and pain. Open sore at the left third toe. Initial encounter. EXAM: LEFT THIRD TOE COMPARISON:  None. FINDINGS: The pattern of air at the distal left fourth toe raises question for infection with a gas producing organism, with overlying soft tissue disruption. No definite osseous erosions are characterized to suggest osteomyelitis, though it cannot be excluded on the basis of radiograph. Soft tissue swelling is noted about the toes. There is no evidence of fracture or dislocation. No  radiopaque foreign bodies are seen. IMPRESSION: 1. Pattern of air at the distal left fourth toe raises question for infection with a gas producing organism, with overlying soft tissue disruption. Soft tissue swelling noted about the toes. 2. No definite osseous erosion seen to suggest osteomyelitis, though it cannot be excluded on the basis of radiograph. These results were called by telephone at the time of interpretation on 01/17/2017 at 12:16 am to [facility] PA, who verbally acknowledged these results. Electronically Signed   By: Roanna Raider M.D.   On: 01/17/2017 00:16    ROS: As per HPI otherwise negative.   Physical Exam: Vitals:   01/17/17 0146 01/17/17 0200 01/17/17 0246 01/17/17 0537  BP: 100/64 100/70 100/70 94/62  Pulse:  78 89 83  Resp:  18 18  Temp:   97.3 F (36.3 C) 97.5 F (36.4 C)  TempSrc:   Oral Oral  SpO2:  94% 90% 95%  Weight:    67.6 kg (149 lb)  Height:         General: Chronically ill appearing male in NAD Head: Normocephalic, atraumatic, sclera non-icteric, mucus membranes are moist Neck: Supple. JVD not elevated. Lungs: Clear bilaterally to auscultation without wheezes, rales, or rhonchi. Breathing is unlabored. Heart: RRR with S1 S2. 2/6 systolic M.  Abdomen: Soft, non-tender, non-distended with normoactive bowel sounds. No rebound/guarding. No obvious abdominal masses. M-S:  Strength and tone appear normal for age. Lower extremities:blackened 2nd, 3rd and 4th toes L foot with mild L pedal edema present. No palpable pedal pulses. No LE edema other than L pedal edema.  Neuro: Alert and oriented X 3. Moves all extremities spontaneously. Psych:  Responds to questions appropriately with a normal affect. Dialysis Access: LFA AVF + bruit  Dialysis Orders:[place] T,Th, S 3 hr 45 min 180 NRe 68 kg  2.0 K/2.0 Ca 400/800 -Heparin 1400 units IV TIW -Sensipar 180 mg PO TIW (last PTH 1527 12/26/16) -Calcitriol 1 mcg PO TIW -Venofer 50 mg IV weekly  (last dose 01/11/17 Last Fe 40 Tsat 22% 12/26/16) -Mircera 75 mcg IV q w 2 weeks (Last HGB 10.7 01/11/17)   BMD Meds:  Renvela 800 mg 4 tabs PO TID AC (Last phos 3.8 01/11/17)  Assessment/Plan: 1.  Pain/Ulceration of L Foot/Possible Dry gangrene: Ortho and VVS consulted. Started on vanc/zosyn Per primary.  2.  [place]RD -  [place]. No compelling HD needs today. K+ 3.8. HD tomorrow on schedule.  3.  Hypertension/volume  - Takes midodrine 5 mg PO BID.  No evidence of volume overload. Wt 67.6 sl under EDW. UFG 0.5-1 liter tomorrow.   4.  Anemia  - HGB 11.4 recent ESA dose. Follow HGB.  5.  Metabolic bone disease -  Continue binders, VDRA, Sensipar. Add renal function panel to today's labs.  6.  Nutrition -Renal diet w/fld restrictions, renal vit, nepro. 7. H/O Chronic systolic HF with BiV ICD pacemaker: Last EF 15-20% 12/23/16. Monitor volume status carefully.   Benjamen Koelling H. Manson Passey, NP-C 01/17/2017, 1:26 PM  [employer] [phone]

## 2017-01-17 NOTE — ED Notes (Signed)
Admitting at bedside 

## 2017-01-17 NOTE — Telephone Encounter (Signed)
Tiauna with [facility] would like to know if Patient can eat if he is having surgery today?  Patient is in room 5west 39.  Cb# is [phone]

## 2017-01-17 NOTE — Progress Notes (Signed)
Pharmacy Antibiotic Note  Willie Andrade is a 66 y.o. male admitted on 01/16/2017 with cellulitis.  Pharmacy has been consulted for Vancomycin/Zosyn dosing. WBC WNL. ESRD on HD TTS.   Plan: Vancomycin 1250 mg IV x 1, then give 750 mg IV qHD TTS Zosyn 3.375G IV q12h to be infused over 4 hours Trend WBC, temp, HD schedule  F/U infectious work-up Drug levels as indicated   Height: 6\' 2"  (188 cm) Weight: 149 lb (67.6 kg) IBW/kg (Calculated) : 82.2  Temp (24hrs), Avg:97.8 F (36.6 C), Min:97.8 F (36.6 C), Max:97.8 F (36.6 C)   Recent Labs Lab 01/16/17 1642  WBC 8.0  CREATININE 4.72*    Estimated Creatinine Clearance: 14.7 mL/min (A) (by C-G formula based on SCr of 4.72 mg/dL (H)).    No Known Allergies   Abran Duke 01/17/2017 1:20 AM

## 2017-01-17 NOTE — H&P (Signed)
History and Physical    Justn Quale ZOX:[id number] DOB: 09-05-1950 DOA: 01/16/2017  PCP: [name], MD  Patient coming from: Home.  Chief Complaint: Left foot swelling and pain.  HPI: Willie Andrade is a 66 y.o. male with history of ESRD on hemodialysis, nonischemic cardiomyopathy status post AICD, anemia who was recently admitted for presyncope presents to the ER because of worsening swelling and pain in the left foot. Patient has been having these symptoms for last 24 hours. Has been a subjective feeling of fever and chills. Denies any trauma or insect bite.   ED Course: X-rays in the ER showed possible infection with gas formation. ER physician discussed with Dr. Rayburn Ma on call orthopedic surgeon who feels the gas formation may be from the loss of skin integrity and not from gas-forming organism. Orthopedics will be seeing patient in consult.    Review of Systems: As per HPI, rest all negative.   Past Medical History:  Diagnosis Date  . Chronic systolic CHF (congestive heart failure), NYHA class 2 (HCC)   . ESRD (end stage renal disease) on dialysis (HCC)   . Hepatitis B   . HTN (hypertension)   . Hyperlipidemia   . 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 Tobacco abuse     Past Surgical History:  Procedure Laterality Date  . BIV ICD GENERATOR CHANGEOUT N/A 11/28/2016   Procedure: BiV ICD [employer]; St Jude Surgeon: Will Jorja Loa, MD;  Location: [facility] INVAS[facility] CV LAB;  Service: Cardiovascular;  Laterality: N/A;     reports that he has been smoking Cigarettes.  He has been smoking about 0.00 packs per day. He has never used smokeless tobacco. He reports that he does not drink alcohol or use drugs.  No Known Allergies  Family History  Problem Relation Age of Onset  . Diabetes Mother   . Gout Mother   . Stroke Father   . Gout Maternal Grandmother   . Gout Sister   . Asthma Brother     Prior to  Admission medications   Medication Sig Start Date End Date Taking? Authorizing Provider  calcium acetate (PHOSLO) 667 MG capsule Take 3 capsules (2,001 mg total) by mouth 3 (three) times daily with meals. 12/23/16  Yes Clydia Llano, MD  cinacalcet (SENSIPAR) 30 MG tablet Take 1 tablet (30 mg total) by mouth daily. Tues / Thurs / Sat 12/23/16  Yes Clydia Llano, MD  gabapentin (NEURONTIN) 100 MG capsule Take 100 mg by mouth 2 (two) times daily.    Yes [provider]  midodrine (PROAMATINE) 5 MG tablet Take 1 tablet (5 mg total) by mouth 2 (two) times daily. 12/23/16  Yes Clydia Llano, MD  lidocaine-prilocaine (EMLA) cream Apply 1 application topically Every Tuesday,Thursday,and Saturday with dialysis.    [provider]    Physical Exam: Vitals:   01/16/17 2120 01/17/17 0000 01/17/17 0045 01/17/17 0054  BP: (!) 92/58 108/74 104/72   Pulse: 81 81  60  Resp: 14     Temp:      TempSrc:      SpO2: 99% 96%  (!) 71%  Weight:      Height:          Constitutional: Moderately built and nourished. Vitals:   01/16/17 2120 01/17/17 0000 01/17/17 0045 01/17/17 0054  BP: (!) 92/58 108/74 104/72   Pulse: 81 81  60  Resp: 14     Temp:  TempSrc:      SpO2: 99% 96%  (!) 71%  Weight:      Height:       Eyes: Anicteric no pallor. ENMT: No discharge from the ears eyes nose and mouth. Neck: No mass felt. No neck rigidity. Respiratory: No rhonchi or crepitations. Cardiovascular: S1 and S2 heard no murmurs appreciated. Abdomen: Soft nontender bowel sounds present. No guarding or rigidity. Musculoskeletal: Left foot swelling. Erythematous. No active discharge. Skin: Erythema of the foot. Neurologic: Alert awake oriented to time place and person. Moves all extremities. Psychiatric: Appears normal. Normal affect.   Labs on Admission: I have personally reviewed following labs and imaging studies  CBC:  Recent Labs Lab 01/16/17 1642  WBC 8.0  HGB 11.6*  HCT 37.2*    MCV 89.2  PLT 310   Basic Metabolic Panel:  Recent Labs Lab 01/16/17 1642  NA 139  K 3.4*  CL 95*  CO2 30  GLUCOSE 83  BUN 17  CREATININE 4.72*  CALCIUM 7.8*   GFR: Estimated Creatinine Clearance: 14.7 mL/min (A) (by C-G formula based on SCr of 4.72 mg/dL (H)). Liver Function Tests: No results for input(s): AST, ALT, ALKPHOS, BILITOT, PROT, ALBUMIN in the last 168 hours. No results for input(s): LIPASE, AMYLASE in the last 168 hours. No results for input(s): AMMONIA in the last 168 hours. Coagulation Profile: No results for input(s): INR, PROTIME in the last 168 hours. Cardiac Enzymes: No results for input(s): CKTOTAL, CKMB, CKMBINDEX, TROPONINI in the last 168 hours. BNP (last 3 results) No results for input(s): PROBNP in the last 8760 hours. HbA1C: No results for input(s): HGBA1C in the last 72 hours. CBG: No results for input(s): GLUCAP in the last 168 hours. Lipid Profile: No results for input(s): CHOL, HDL, LDLCALC, TRIG, CHOLHDL, LDLDIRECT in the last 72 hours. Thyroid Function Tests: No results for input(s): TSH, T4TOTAL, FREET4, T3FREE, THYROIDAB in the last 72 hours. Anemia Panel: No results for input(s): VITAMINB12, FOLATE, FERRITIN, TIBC, IRON, RETICCTPCT in the last 72 hours. Urine analysis: No results found for: COLORURINE, APPEARANCEUR, LABSPEC, PHURINE, GLUCOSEU, HGBUR, BILIRUBINUR, KETONESUR, PROTEINUR, UROBILINOGEN, NITRITE, LEUKOCYTESUR Sepsis Labs: @LABRCNTIP (procalcitonin:4,lacticidven:4) )No results found for this or any previous visit (from the past 240 hour(s)).   Radiological Exams on Admission: Dg Toe 3rd Left  Result Date: 01/17/2017 CLINICAL DATA:  Acute onset of left foot swelling and pain. Open sore at the left third toe. Initial encounter. EXAM: LEFT THIRD TOE COMPARISON:  None. FINDINGS: The pattern of air at the distal left fourth toe raises question for infection with a gas producing organism, with overlying soft tissue disruption. No  definite osseous erosions are characterized to suggest osteomyelitis, though it cannot be excluded on the basis of radiograph. Soft tissue swelling is noted about the toes. There is no evidence of fracture or dislocation. No radiopaque foreign bodies are seen. IMPRESSION: 1. Pattern of air at the distal left fourth toe raises question for infection with a gas producing organism, with overlying soft tissue disruption. Soft tissue swelling noted about the toes. 2. No definite osseous erosion seen to suggest osteomyelitis, though it cannot be excluded on the basis of radiograph. These results were called by telephone at the time of interpretation on 01/17/2017 at 12:16 am to [facility] PA, who verbally acknowledged these results. Electronically Signed   By: Roanna Raider M.D.   On: 01/17/2017 00:16     Assessment/Plan Principal Problem:   Cellulitis of left foot Active Problems:   ESRD (end stage  renal disease) (HCC)   Chronic systolic heart failure (HCC)   Cellulitis    1. Cellulitis of the left foot - patient is placed on empiric antibiotics. Unable to do MRI due to AICD. Orthopedics to see patient in consult. Check uric acid level and sedimentation rate. 2. ESRD on hemodialysis on Tuesdays Thursdays and Saturday - had dialysis yesterday. Consult nephrology for dialysis. 3. Anemia secondary to ESRD - follow CBC. 4. Nonischemic cardiomyopathy status post AICD - volume management per nephrology.   DVT prophylaxis: SCDs. Code Status: Full code.  Family Communication: Discussed with patient.  Disposition Plan: Home.  Consults called: Orthopedics.  Admission status: Inpatient.    Eduard Clos MD [facility] Pager [phone].  If 7PM-7AM, please contact night-coverage [email or web address] Password [email or web address]  01/17/2017, 1:15 AM

## 2017-01-17 NOTE — Consult Note (Addendum)
ORTHOPAEDIC CONSULTATION  REQUESTING PHYSICIAN: Elgergawy, Leana Roe, MD  Chief Complaint: Ischemic pain left foot  HPI: Willie Andrade is a 66 y.o. male who presents with ischemic pain left toes. Patient states that he was scheduled for a follow-up appointment from nephrology to podiatry. Patient complains of painful ischemic changes to the left forefoot.  Past Medical History:  Diagnosis Date  . Chronic systolic CHF (congestive heart failure), NYHA class 2 (HCC)   . ESRD (end stage renal disease) on dialysis (HCC)   . Hepatitis B   . HTN (hypertension)   . Hyperlipidemia   . 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 Tobacco abuse    Past Surgical History:  Procedure Laterality Date  . BIV ICD GENERATOR CHANGEOUT N/A 11/28/2016   Procedure: BiV ICD [employer]; St Jude Surgeon: Willie Jorja Loa, MD;  Location: [facility] INV[facility];  Service: Cardiovascular;  Laterality: N/A;   Social History   Social History  . Marital status: Legally Separated    Spouse name: N/A  . Number of children: N/A  . Years of education: N/A   Social History Main Topics  . Smoking status: Current Every Day Smoker    Packs/day: 0.00    Types: Cigarettes  . Smokeless tobacco: Never Used  . Alcohol use No  . Drug use: No  . Sexual activity: Not Asked   Other Topics Concern  . None   Social History Narrative  . None   Family History  Problem Relation Age of Onset  . Diabetes Mother   . Gout Mother   . Stroke Father   . Gout Maternal Grandmother   . Gout Sister   . Asthma Brother    - negative except otherwise stated in the family history section No Known Allergies Prior to Admission medications   Medication Sig Start Date End Date Taking? Authorizing Provider  calcium acetate (PHOSLO) 667 MG capsule Take 3 capsules (2,001 mg total) by mouth 3 (three) times daily with meals. 12/23/16  Yes Clydia Llano, MD  cinacalcet  (SENSIPAR) 30 MG tablet Take 1 tablet (30 mg total) by mouth daily. Tues / Thurs / Sat 12/23/16  Yes Clydia Llano, MD  gabapentin (NEURONTIN) 100 MG capsule Take 100 mg by mouth 2 (two) times daily.    Yes [provider]  midodrine (PROAMATINE) 5 MG tablet Take 1 tablet (5 mg total) by mouth 2 (two) times daily. 12/23/16  Yes Clydia Llano, MD  lidocaine-prilocaine (EMLA) cream Apply 1 application topically Every Tuesday,Thursday,and Saturday with dialysis.    [provider]   Dg Toe 3rd Left  Result Date: 01/17/2017 CLINICAL DATA:  Acute onset of left foot swelling and pain. Open sore at the left third toe. Initial encounter. EXAM: LEFT THIRD TOE COMPARISON:  None. FINDINGS: The pattern of air at the distal left fourth toe raises question for infection with a gas producing organism, with overlying soft tissue disruption. No definite osseous erosions are characterized to suggest osteomyelitis, though it cannot be excluded on the basis of radiograph. Soft tissue swelling is noted about the toes. There is no evidence of fracture or dislocation. No radiopaque foreign bodies are seen. IMPRESSION: 1. Pattern of air at the distal left fourth toe raises question for infection with a gas producing organism, with overlying soft tissue disruption. Soft tissue swelling noted about the toes. 2. No definite osseous erosion seen to suggest osteomyelitis, though it cannot be excluded on  the basis of radiograph. These results were called by telephone at the time of interpretation on 01/17/2017 at 12:16 am to [facility] PA, who verbally acknowledged these results. Electronically Signed   By: Roanna Raider M.D.   On: 01/17/2017 00:16   - pertinent xrays, CT, MRI studies were reviewed and independently interpreted  Positive ROS: All other systems have been reviewed and were otherwise negative with the exception of those mentioned in the HPI and as above.  Physical Exam: General: Alert, no acute  distress Psychiatric: Patient is competent for consent with normal mood and affect Lymphatic: No axillary or cervical lymphadenopathy Cardiovascular: No pedal edema Respiratory: No cyanosis, no use of accessory musculature GI: No organomegaly, abdomen is soft and non-tender  Skin: Examination patient's both lower extremities are thin with atrophic skin. Patient has ischemic changes to the toes with the toes being thin and atrophic. I cannot palpate a dorsalis pedis or posterior tibial pulse. Ischemic open ulcers at the tips of the lesser toes left foot   Neurologic: Patient does not have protective sensation bilateral lower extremities.   MUSCULOSKELETAL:  Patient's lower extremity is are thin and atrophic his left foot is cold compared to the right. I cannot determine capillary refill because patient pulses foot back due to pain. He does not have a palpable pulse bilaterally. Radiographs are reviewed which show some air distally but this is most likely due to the ischemic open ulcers.  Assessment: Assessment: End-stage renal disease on dialysis with dry gangrenous changes of the left forefoot.  Plan: Plan: I have ordered an ankle-brachial indices. Would also recommend consult with vascular surgery pending the results of ABIs. I Willie follow-up after studies obtained.  Thank you for the consult and the opportunity to see Mr. Willie Faciane, MD [facility] [phone] 8:20 AM

## 2017-01-17 NOTE — ED Notes (Signed)
Attempted report x1. 

## 2017-01-17 NOTE — Consult Note (Signed)
[facility]  Patient name: Willie Andrade MRN: [id number] DOB: 1951-03-01 Sex: male  REASON FOR CONSULT: left foot pain and ulceration, consult is from Dr. Lajoyce Corners  HPI: Willie Andrade is a 66 y.o. male, who presents with one week history of left foot pain. He states that it is getting worse. He reports fever and chills. He can ambulate a block but has to stop secondary to dyspnea. He does not ambulate enough to elicit claudication. He denies any rest pain or prior history of non healing wounds. He reports having had ballooning of his bilateral "legs" 4 years ago after pacemaker placement in [place]. He denied any claudication or non healing wounds back then.   He smokes 1/2 pack per day. He has ESRD on HD via left forearm fistula on Tuesdays, Thursdays and Saturdays. He has nonischemic cardiomyopathy s/p AICD. He has history of hepatitis B. He is not diabetic.   Past Medical History:  Diagnosis Date  . Chronic systolic CHF (congestive heart failure), NYHA class 2 (HCC)   . ESRD (end stage renal disease) on dialysis (HCC)   . Hepatitis B   . HTN (hypertension)   . Hyperlipidemia   . 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 Tobacco abuse     Family History  Problem Relation Age of Onset  . Diabetes Mother   . Gout Mother   . Stroke Father   . Gout Maternal Grandmother   . Gout Sister   . Asthma Brother     SOCIAL HISTORY: Social History   Social History  . Marital status: Legally Separated    Spouse name: N/A  . Number of children: N/A  . Years of education: N/A   Occupational History  . Not on file.   Social History Main Topics  . Smoking status: Current Every Day Smoker    Packs/day: 0.00    Types: Cigarettes  . Smokeless tobacco: Never Used  . Alcohol use No  . Drug use: No  . Sexual activity: Not on file   Other Topics Concern  . Not on file   Social History Narrative  .  No narrative on file    No Known Allergies  MEDICATIONS:  Current Facility-Administered Medications  Medication Dose Route Frequency Provider Last Rate Last Dose  . acetaminophen (TYLENOL) tablet 650 mg  650 mg Oral Q6H PRN Eduard Clos, MD       Or  . acetaminophen (TYLENOL) suppository 650 mg  650 mg Rectal Q6H PRN Eduard Clos, MD      . calcium acetate (PHOSLO) capsule 2,001 mg  2,001 mg Oral TID WC Eduard Clos, MD   2,001 mg at 01/17/17 0904  . [START ON 01/18/2017] cinacalcet (SENSIPAR) tablet 30 mg  30 mg Oral Q T,Th,Sat-1800 Eduard Clos, MD      . gabapentin (NEURONTIN) capsule 100 mg  100 mg Oral BID Eduard Clos, MD   100 mg at 01/17/17 2130  . midodrine (PROAMATINE) tablet 5 mg  5 mg Oral BID WC Eduard Clos, MD   5 mg at 01/17/17 0904  . ondansetron (ZOFRAN) tablet 4 mg  4 mg Oral Q6H PRN Eduard Clos, MD       Or  . ondansetron [facility]) injection 4 mg  4 mg Intravenous Q6H PRN Eduard Clos, MD      . piperacillin-tazobactam (ZOSYN) IVPB 3.375 g  3.375 g  Intravenous Q12H Stevphen Rochester, RPH      . [START ON 01/18/2017] vancomycin (VANCOCIN) IVPB 750 mg/150 ml premix  750 mg Intravenous Q T,Th,Sa-HD Ledford, James L, RPH        REVIEW OF SYSTEMS:  [X]  denotes positive finding, [ ]  denotes negative finding Cardiac  Comments:  Chest pain or chest pressure:    Shortness of breath upon exertion:    Short of breath when lying flat:    Irregular heart rhythm:        Vascular    Pain in calf, thigh, or hip brought on by ambulation:    Pain in feet at night that wakes you up from your sleep:     Blood clot in your veins:    Leg swelling:         Pulmonary    Oxygen at home:    Productive cough:     Wheezing:         Neurologic    Sudden weakness in arms or legs:     Sudden numbness in arms or legs:     Sudden onset of difficulty speaking or slurred speech:    Temporary loss of vision in one eye:       Problems with dizziness:         Gastrointestinal    Blood in stool:     Vomited blood:         Genitourinary    Burning when urinating:     Blood in urine:        Psychiatric    Major depression:         Hematologic    Bleeding problems:    Problems with blood clotting too easily:        Skin    Rashes or ulcers: x Wound left foot      Constitutional    Fever or chills:      PHYSICAL EXAM: Vitals:   01/17/17 0146 01/17/17 0200 01/17/17 0246 01/17/17 0537  BP: 100/64 100/70 100/70 94/62  Pulse:  78 89 83  Resp:   18 18  Temp:   97.3 F (36.3 C) 97.5 F (36.4 C)  TempSrc:   Oral Oral  SpO2:  94% 90% 95%  Weight:    149 lb (67.6 kg)  Height:        GENERAL: The patient is a well-nourished male, in no acute distress. The vital signs are documented above. HEENT: normocephalic, atraumatic, no abnormalities noted.  CARDIAC: There is a regular rate and rhythm. No carotid bruits. VASCULAR: Palpable thrill left forearm fistula. 2+ femoral pulses bilaterally. Non palpable popliteal pulses. Non palpable pedal pulses. Monophasic DP, PT and peroneal signals bilaterally. Ulceration between left 3rd and 4th toes.  PULMONARY: There is good air exchange bilaterally without wheezing or rales. ABDOMEN: Soft and non-tender.  MUSCULOSKELETAL: There are no major deformities or cyanosis. NEUROLOGIC: No focal weakness or paresthesias are detected. SKIN:      PSYCHIATRIC: The patient has a normal affect.  DATA:  ABIs pending  MEDICAL ISSUES: Ulceration left foot  Has history of "ballooning of both legs" 4 years ago in [place] for asymptomatic disease. Was performed after AICD placement. Will need angiogram to evaluate blood flow to left foot. Is on HD TTS. Dr. Arbie Cookey to see patient later this afternoon.    Maris Berger, PA-C [facility] [place] [phone]   I have examined the patient, reviewed and agree with above.Infrainguinal  occlusive disease.  No evidence of acute ischemia. Doubt he has adequate flow for healing the ulcerations on his foot. Will need arteriogram for further evaluation.  Gretta Began, MD 01/17/2017 8:59 PM

## 2017-01-18 ENCOUNTER — Encounter (HOSPITAL_COMMUNITY): Payer: Medicare Other

## 2017-01-18 DIAGNOSIS — I5022 Chronic systolic (congestive) heart failure: Secondary | ICD-10-CM

## 2017-01-18 LAB — CBC
HCT: 38.8 % — ABNORMAL LOW (ref 39.0–52.0)
Hemoglobin: 11.7 g/dL — ABNORMAL LOW (ref 13.0–17.0)
MCH: 27.3 pg (ref 26.0–34.0)
MCHC: 30.2 g/dL (ref 30.0–36.0)
MCV: 90.7 fL (ref 78.0–100.0)
Platelets: 179 K/uL (ref 150–400)
RBC: 4.28 MIL/uL (ref 4.22–5.81)
RDW: 17.9 % — ABNORMAL HIGH (ref 11.5–15.5)
WBC: 7.3 10*3/uL (ref 4.0–10.5)

## 2017-01-18 LAB — MRSA PCR SCREENING: MRSA BY PCR: NEGATIVE

## 2017-01-18 LAB — RENAL FUNCTION PANEL
Albumin: 2.9 g/dL — ABNORMAL LOW (ref 3.5–5.0)
Anion gap: 13 (ref 5–15)
BUN: 34 mg/dL — ABNORMAL HIGH (ref 6–20)
CO2: 30 mmol/L (ref 22–32)
Calcium: 7.5 mg/dL — ABNORMAL LOW (ref 8.9–10.3)
Chloride: 96 mmol/L — ABNORMAL LOW (ref 101–111)
Creatinine, Ser: 7.76 mg/dL — ABNORMAL HIGH (ref 0.61–1.24)
GFR calc Af Amer: 7 mL/min — ABNORMAL LOW (ref >60)
GFR calc non Af Amer: 6 mL/min — ABNORMAL LOW (ref >60)
Glucose, Bld: 103 mg/dL — ABNORMAL HIGH (ref 65–99)
Phosphorus: 3.6 mg/dL (ref 2.5–4.6)
Potassium: 4.3 mmol/L (ref 3.5–5.1)
Sodium: 139 mmol/L (ref 135–145)

## 2017-01-18 MED ORDER — VANCOMYCIN HCL IN DEXTROSE 750-5 MG/150ML-% IV SOLN
INTRAVENOUS | Status: AC
  Administered 2017-01-18: 750 mg via INTRAVENOUS
  Filled 2017-01-18: qty 150

## 2017-01-18 MED ORDER — LIDOCAINE-PRILOCAINE 2.5-2.5 % EX CREA: 1.0000 "application " | TOPICAL_CREAM | CUTANEOUS | Status: DC | PRN

## 2017-01-18 MED ORDER — SODIUM CHLORIDE 0.9 % IV SOLN: 100.0000 mL | INTRAVENOUS | Status: DC | PRN

## 2017-01-18 MED ORDER — LIDOCAINE HCL (PF) 1 % IJ SOLN
5.0000 mL | INTRAMUSCULAR | Status: DC | PRN
  Filled 2017-01-18: qty 5

## 2017-01-18 MED ORDER — HEPARIN SODIUM (PORCINE) 5000 UNIT/ML IJ SOLN
5000.0000 [IU] | Freq: Three times a day (TID) | INTRAMUSCULAR | Status: DC
Start: 2017-01-18 — End: 2017-01-22
  Administered 2017-01-18 – 2017-01-21 (×9): 5000 [IU] via SUBCUTANEOUS
  Filled 2017-01-18 (×10): qty 1

## 2017-01-18 MED ORDER — HEPARIN SODIUM (PORCINE) 1000 UNIT/ML DIALYSIS
20.0000 [IU]/kg | INTRAMUSCULAR | Status: DC | PRN
  Administered 2017-01-18: 1400 [IU] via INTRAVENOUS_CENTRAL

## 2017-01-18 MED ORDER — PENTAFLUOROPROP-TETRAFLUOROETH EX AERO: 1.0000 "application " | INHALATION_SPRAY | CUTANEOUS | Status: DC | PRN

## 2017-01-18 NOTE — Progress Notes (Signed)
HD tx initiated via 15G x2 w/o problem, pull/push/flush w/o problem, will cont to monitor while on HD tx 

## 2017-01-18 NOTE — Progress Notes (Signed)
[facility] ROUNDING NOTE   Subjective:   Interval History: Nolen Siracusa is a 66 y.o. male with ESRD on hemodialysis T,Th,S at [facility]. PMH significant for HTN, now hypotension, HLD, hepatitis B, chronic systolic HF with BiV ICD pacemaker, tobacco abuse He has blackened 2nd, 3rd and 4th toes L foot with mild pedal edema present. There is some dried drainage between 3rd & 4th L toes, no overwhelming odor.L Foot/Possible Dry gangrene: Ortho and VVS consulted. Started on vanc/zosyn Per primary.   Objective:  Vital signs in last 24 hours:  Temp:  [98.2 F (36.8 C)-98.4 F (36.9 C)] 98.2 F (36.8 C) (06/21 0501) Pulse Rate:  [86-88] 88 (06/21 0501) Resp:  [17] 17 (06/21 0501) BP: (89-100)/(63-73) 100/73 (06/21 0501) SpO2:  [97 %-100 %] 100 % (06/21 0501) Weight:  [151 lb 0.2 oz (68.5 kg)] 151 lb 0.2 oz (68.5 kg) (06/21 0501)  Weight change: 2 lb 0.2 oz (0.914 kg) Filed Weights   01/16/17 1632 01/17/17 0537 01/18/17 0501  Weight: 149 lb (67.6 kg) 149 lb (67.6 kg) 151 lb 0.2 oz (68.5 kg)    Intake/Output: I/O last 3 completed shifts: In: 700 [P.O.:600; IV Piggyback:100] Out: -    Intake/Output this shift:  No intake/output data recorded.  CVS- RRR RS- CTA ABD- BS present soft non-distended EXT- no edema   Basic Metabolic Panel:  Recent Labs Lab 01/16/17 1642 01/17/17 0720  NA 139 141  K 3.4* 3.8  CL 95* 97*  CO2 30 32  GLUCOSE 83 85  BUN 17 22*  CREATININE 4.72* 5.99*  CALCIUM 7.8* 7.5*    Liver Function Tests: No results for input(s): AST, ALT, ALKPHOS, BILITOT, PROT, ALBUMIN in the last 168 hours. No results for input(s): LIPASE, AMYLASE in the last 168 hours. No results for input(s): AMMONIA in the last 168 hours.  CBC:  Recent Labs Lab 01/16/17 1642 01/17/17 0720  WBC 8.0 5.8  HGB 11.6* 11.4*  HCT 37.2* 38.6*  MCV 89.2 91.3  PLT 310 156    Cardiac Enzymes: No results for input(s): CKTOTAL, CKMB, CKMBINDEX,  TROPONINI in the last 168 hours.  BNP: Invalid input(s): POCBNP  CBG: No results for input(s): GLUCAP in the last 168 hours.  Microbiology: Results for orders placed or performed during the hospital encounter of 12/21/16  Culture, blood (Routine X 2) w Reflex to ID Panel     Status: None   Collection Time: 12/21/16  8:02 PM  Result Value Ref Range Status   Specimen Description BLOOD RIGHT HAND  Final   Special Requests IN PEDIATRIC BOTTLE Blood Culture adequate volume  Final   Culture NO GROWTH 5 DAYS  Final   Report Status 12/26/2016 FINAL  Final  Culture, blood (Routine X 2) w Reflex to ID Panel     Status: None   Collection Time: 12/21/16  8:41 PM  Result Value Ref Range Status   Specimen Description BLOOD RIGHT ANTECUBITAL  Final   Special Requests   Final    BOTTLES DRAWN AEROBIC AND ANAEROBIC Blood Culture adequate volume   Culture NO GROWTH 5 DAYS  Final   Report Status 12/26/2016 FINAL  Final    Coagulation Studies: No results for input(s): LABPROT, INR in the last 72 hours.  Urinalysis: No results for input(s): COLORURINE, LABSPEC, PHURINE, GLUCOSEU, HGBUR, BILIRUBINUR, KETONESUR, PROTEINUR, UROBILINOGEN, NITRITE, LEUKOCYTESUR in the last 72 hours.  Invalid input(s): APPERANCEUR    Imaging: Dg Toe 3rd Left  Result Date: 01/17/2017 CLINICAL  DATA:  Acute onset of left foot swelling and pain. Open sore at the left third toe. Initial encounter. EXAM: LEFT THIRD TOE COMPARISON:  None. FINDINGS: The pattern of air at the distal left fourth toe raises question for infection with a gas producing organism, with overlying soft tissue disruption. No definite osseous erosions are characterized to suggest osteomyelitis, though it cannot be excluded on the basis of radiograph. Soft tissue swelling is noted about the toes. There is no evidence of fracture or dislocation. No radiopaque foreign bodies are seen. IMPRESSION: 1. Pattern of air at the distal left fourth toe raises question  for infection with a gas producing organism, with overlying soft tissue disruption. Soft tissue swelling noted about the toes. 2. No definite osseous erosion seen to suggest osteomyelitis, though it cannot be excluded on the basis of radiograph. These results were called by telephone at the time of interpretation on 01/17/2017 at 12:16 am to [facility] PA, who verbally acknowledged these results. Electronically Signed   By: Roanna Raider M.D.   On: 01/17/2017 00:16     Medications:   . piperacillin-tazobactam (ZOSYN)  IV Stopped (01/18/17 0214)  . vancomycin     . calcitRIOL  1 mcg Oral Q T,Th,Sa-HD  . cinacalcet  180 mg Oral Q T,Th,Sat-1800  . feeding supplement (NEPRO CARB STEADY)  237 mL Oral BID BM  . gabapentin  100 mg Oral BID  . midodrine  5 mg Oral BID WC  . multivitamin  1 tablet Oral QHS  . sevelamer carbonate  3,200 mg Oral TID WC   acetaminophen **OR** acetaminophen, ondansetron **OR** ondansetron (ZOFRAN) IV, oxyCODONE-acetaminophen  Assessment/ Plan:  1.  Pain/Ulceration of L Foot/Possible Dry gangrene: Ortho and VVS consulted. Started on vanc/zosyn Per primary.  2.  ESRD -  T,[place],S [place].   3.  Hypertension/volume  - Takes midodrine 5 mg PO BID.  No evidence of volume overload. Wt 67.6 sl under EDW. UFG 0.5-1 liter tomorrow.   4.  Anemia  - HGB 11.4 recent ESA dose. Follow HGB.  5.  Metabolic bone disease -  Continue binders, VDRA, Sensipar. Add renal function panel to today's labs.  6.  Nutrition -Renal diet w/fld restrictions, renal vit, nepro. 7. H/O Chronic systolic HF with BiV ICD pacemaker: Last EF 15-20% 12/23/16. Monitor volume status carefully.    LOS: 1 Leola Fiore W @TODAY @8 :11 AM

## 2017-01-18 NOTE — Progress Notes (Signed)
HD tx completed @ 1938 w/o problem, UF goal met, blood rinsed back, report called to Filbert Berthold, RN

## 2017-01-18 NOTE — Progress Notes (Signed)
PROGRESS NOTE                                                                                                                                                                                                             Patient Demographics:    Willie Andrade, is a 66 y.o. male, DOB - November 11, 1950, AVW:[id number]  Admit date - 01/16/2017   Admitting Physician Eduard Clos, MD  Outpatient Primary MD for the patient is [name], MD  LOS - 1   Chief Complaint  Patient presents with  . Leg Swelling       Brief Narrative   66 y.o. male with history of ESRD on hemodialysis, nonischemic cardiomyopathy status post AICD, anemia who was recently admitted for presyncope , admitted 6/19 with left foot gangrene.   Subjective:    Demetrios Clovis Riley today Complains of left foot pain, has any shortness of breath, no chest pain, no lightheadedness or dizziness   Assessment  & Plan :    Principal Problem:   Cellulitis of left foot Active Problems:   ESRD (end stage renal disease) (HCC)   Chronic systolic heart failure (HCC)   Cellulitis   Gangrene of toe of left foot (HCC)  Left foot cellulitis/dry gangrene - Continue with IV vancomycin/Zosyn coverage,  -  orthopedic consult greatly appreciated, likely will need debridement, amputation. - vascular surgery has seen patient as well, but he likely will need arteriogram to evaluate for circulation to see if revascularization candidate, arteriogram timing her vascular. - Continue with when necessary pain medication  ESRD - Renal consulted to resume dialysis on TTS schedule  Anemia - Secondary to end-stage renal disease, follow hemoglobin, Neupogen per renal  Hypertension - Continue with home medication  Chronic systolic CHF/Nonischemic cardiomyopathy  - Last EF 15-20% 12/23/16, that is post biventricular ICD, volume management with dialysis    Code Status : Full  Family Communication  : None  at bedside  Disposition Plan  : Pending further workup  Consults  :  Orthopedic, vascular, renal  Procedures  : None  DVT Prophylaxis  :  Heparin  Lab Results  Component Value Date   PLT 156 01/17/2017    Antibiotics  :   Anti-infectives    Start     Dose/Rate Route Frequency Ordered Stop   01/18/17 1200  vancomycin (VANCOCIN)  IVPB 750 mg/150 ml premix     750 mg 150 mL/hr over 60 Minutes Intravenous Every T-Th-Sa (Hemodialysis) 01/17/17 0128     01/17/17 1000  piperacillin-tazobactam (ZOSYN) IVPB 3.375 g     3.375 g 12.5 mL/hr over 240 Minutes Intravenous Every 12 hours 01/17/17 0128     01/17/17 0130  vancomycin (VANCOCIN) 1,250 mg in sodium chloride 0.9 % 250 mL IVPB     1,250 mg 166.7 mL/hr over 90 Minutes Intravenous  Once 01/17/17 0119 01/17/17 0351   01/17/17 0045  vancomycin (VANCOCIN) IVPB 1000 mg/200 mL premix  Status:  Discontinued     1,000 mg 200 mL/hr over 60 Minutes Intravenous  Once 01/17/17 0042 01/17/17 0118   01/17/17 0045  piperacillin-tazobactam (ZOSYN) IVPB 3.375 g     3.375 g 100 mL/hr over 30 Minutes Intravenous  Once 01/17/17 0042 01/17/17 0138        Objective:   Vitals:   01/17/17 0537 01/17/17 1425 01/17/17 2339 01/18/17 0501  BP: 94/62 96/69 (!) 89/63 100/73  Pulse: 83 86 88 88  Resp: 18 17 17 17   Temp: 97.5 F (36.4 C) 98.2 F (36.8 C) 98.4 F (36.9 C) 98.2 F (36.8 C)  TempSrc: Oral  Oral Oral  SpO2: 95% 97% 100% 100%  Weight: 67.6 kg (149 lb)   68.5 kg (151 lb 0.2 oz)  Height:        Wt Readings from Last 3 Encounters:  01/18/17 68.5 kg (151 lb 0.2 oz)  01/03/17 67.6 kg (149 lb)  12/23/16 67.9 kg (149 lb 11.1 oz)     Intake/Output Summary (Last 24 hours) at 01/18/17 1140 Last data filed at 01/18/17 0655  Gross per 24 hour  Intake              460 ml  Output                0 ml  Net              460 ml     Physical Exam  Awake Alert, Oriented X 3.  Good air entry bilaterally, clear to auscultation, no use of  accessory muscles RRR,No Gallops,Rubs or new Murmurs, No Parasternal Heave Abdomen soft, nontender, nondistended, bowel sounds presents Serration with Dark discoloration of the left third and fourth toes, as is nonpalpable and popliteal/pedal left lower extremity .    Data Review:    CBC  Recent Labs Lab 01/16/17 1642 01/17/17 0720  WBC 8.0 5.8  HGB 11.6* 11.4*  HCT 37.2* 38.6*  PLT 310 156  MCV 89.2 91.3  MCH 27.8 27.0  MCHC 31.2 29.5*  RDW 17.7* 17.7*    Chemistries   Recent Labs Lab 01/16/17 1642 01/17/17 0720  NA 139 141  K 3.4* 3.8  CL 95* 97*  CO2 30 32  GLUCOSE 83 85  BUN 17 22*  CREATININE 4.72* 5.99*  CALCIUM 7.8* 7.5*   ------------------------------------------------------------------------------------------------------------------ No results for input(s): CHOL, HDL, LDLCALC, TRIG, CHOLHDL, LDLDIRECT in the last 72 hours.  No results found for: HGBA1C ------------------------------------------------------------------------------------------------------------------ No results for input(s): TSH, T4TOTAL, T3FREE, THYROIDAB in the last 72 hours.  Invalid input(s): FREET3 ------------------------------------------------------------------------------------------------------------------ No results for input(s): VITAMINB12, FOLATE, FERRITIN, TIBC, IRON, RETICCTPCT in the last 72 hours.  Coagulation profile No results for input(s): INR, PROTIME in the last 168 hours.  No results for input(s): DDIMER in the last 72 hours.  Cardiac Enzymes No results for input(s): CKMB, TROPONINI, MYOGLOBIN in the last 168 hours.  Invalid input(s): CK ------------------------------------------------------------------------------------------------------------------ No results found for: BNP  Inpatient Medications  Scheduled Meds: . calcitRIOL  1 mcg Oral Q T,Th,Sa-HD  . cinacalcet  180 mg Oral Q T,Th,Sat-1800  . feeding supplement (NEPRO CARB STEADY)  237 mL Oral BID  BM  . gabapentin  100 mg Oral BID  . midodrine  5 mg Oral BID WC  . multivitamin  1 tablet Oral QHS  . sevelamer carbonate  3,200 mg Oral TID WC   Continuous Infusions: . piperacillin-tazobactam (ZOSYN)  IV 3.375 g (01/18/17 1034)  . vancomycin     PRN Meds:.acetaminophen **OR** acetaminophen, ondansetron **OR** ondansetron (ZOFRAN) IV, oxyCODONE-acetaminophen  Micro Results Recent Results (from the past 240 hour(s))  MRSA PCR Screening     Status: None   Collection Time: 01/18/17  4:50 AM  Result Value Ref Range Status   MRSA by PCR NEGATIVE NEGATIVE Final    Comment:        The GeneXpert MRSA Assay (FDA approved for NASAL specimens only), is one component of a comprehensive MRSA colonization surveillance program. It is not intended to diagnose MRSA infection nor to guide or monitor treatment for MRSA infections.     Radiology Reports X-ray Chest Pa And Lateral  Result Date: 12/22/2016 CLINICAL DATA:  Presyncope EXAM: CHEST  2 VIEW COMPARISON:  11/27/2016 FINDINGS: Right AICD remains in place, unchanged. Cardiomegaly. Patchy airspace opacity in the right lower lobe. Left lung is clear. No effusions or acute bony abnormality. IMPRESSION: Cardiomegaly. Patchy opacity in the right lower lobe.  Cannot exclude pneumonia. Electronically Signed   By: Charlett Nose M.D.   On: 12/22/2016 09:24   Dg Toe 3rd Left  Result Date: 01/17/2017 CLINICAL DATA:  Acute onset of left foot swelling and pain. Open sore at the left third toe. Initial encounter. EXAM: LEFT THIRD TOE COMPARISON:  None. FINDINGS: The pattern of air at the distal left fourth toe raises question for infection with a gas producing organism, with overlying soft tissue disruption. No definite osseous erosions are characterized to suggest osteomyelitis, though it cannot be excluded on the basis of radiograph. Soft tissue swelling is noted about the toes. There is no evidence of fracture or dislocation. No radiopaque foreign  bodies are seen. IMPRESSION: 1. Pattern of air at the distal left fourth toe raises question for infection with a gas producing organism, with overlying soft tissue disruption. Soft tissue swelling noted about the toes. 2. No definite osseous erosion seen to suggest osteomyelitis, though it cannot be excluded on the basis of radiograph. These results were called by telephone at the time of interpretation on 01/17/2017 at 12:16 am to [facility] PA, who verbally acknowledged these results. Electronically Signed   By: Roanna Raider M.D.   On: 01/17/2017 00:16    Sire Poet M.D on 01/18/2017 at 11:40 AM  Between 7am to 7pm - Pager - [phone]  After 7pm go to [email or web address] - password [facility]  [facility] -  Office  [phone]

## 2017-01-19 LAB — CBC
HCT: 40.4 % (ref 39.0–52.0)
HEMOGLOBIN: 12 g/dL — AB (ref 13.0–17.0)
MCH: 26.9 pg (ref 26.0–34.0)
MCHC: 29.7 g/dL — ABNORMAL LOW (ref 30.0–36.0)
MCV: 90.6 fL (ref 78.0–100.0)
PLATELETS: 179 10*3/uL (ref 150–400)
RBC: 4.46 MIL/uL (ref 4.22–5.81)
RDW: 17.7 % — ABNORMAL HIGH (ref 11.5–15.5)
WBC: 7.3 10*3/uL (ref 4.0–10.5)

## 2017-01-19 LAB — SURGICAL PCR SCREEN
MRSA, PCR: NEGATIVE
STAPHYLOCOCCUS AUREUS: NEGATIVE

## 2017-01-19 LAB — BASIC METABOLIC PANEL
ANION GAP: 11 (ref 5–15)
BUN: 14 mg/dL (ref 6–20)
CO2: 31 mmol/L (ref 22–32)
CREATININE: 4.97 mg/dL — AB (ref 0.61–1.24)
Calcium: 7.7 mg/dL — ABNORMAL LOW (ref 8.9–10.3)
Chloride: 95 mmol/L — ABNORMAL LOW (ref 101–111)
GFR, EST AFRICAN AMERICAN: 13 mL/min — AB (ref >60)
GFR, EST NON AFRICAN AMERICAN: 11 mL/min — AB (ref >60)
Glucose, Bld: 102 mg/dL — ABNORMAL HIGH (ref 65–99)
Potassium: 3.8 mmol/L (ref 3.5–5.1)
SODIUM: 137 mmol/L (ref 135–145)

## 2017-01-19 LAB — PROTIME-INR
INR: 1.32
Prothrombin Time: 16.5 seconds — ABNORMAL HIGH (ref 11.4–15.2)

## 2017-01-19 NOTE — Progress Notes (Signed)
PROGRESS NOTE                                                                                                                                                                                                             Patient Demographics:    Willie Andrade, is a 66 y.o. male, DOB - Jan 04, 1951, UJW:[id number]  Admit date - 01/16/2017   Admitting Physician Eduard Clos, MD  Outpatient Primary MD for the patient is [name], MD  LOS - 2   Chief Complaint  Patient presents with  . Leg Swelling       Brief Narrative   66 y.o. male with history of ESRD on hemodialysis, nonischemic cardiomyopathy status post AICD, anemia who was recently admitted for presyncope , admitted 6/19 with left foot gangrene.   Subjective:    Willie Andrade today Has a shortness of breath, chest pain, palpitation or dyspnea, pins of some left foot pain, but controlled .   Assessment  & Plan :    Principal Problem:   Cellulitis of left foot Active Problems:   ESRD (end stage renal disease) (HCC)   Chronic systolic heart failure (HCC)   Cellulitis   Gangrene of toe of left foot (HCC)  Left foot cellulitis/dry gangrene - Continue with IV vancomycin/Zosyn coverage,  -  orthopedic consult greatly appreciated, likely will need debridement, amputation. - vascular surgery has seen patient as well, but he likely will need arteriogram to evaluate for circulation to see if revascularization candidate,Very likely this coming Monday, then likely after that will need debridement versus amputation by orthopedic - Continue with when necessary pain medication  ESRD - Renal consulted to resume dialysis on TTS schedule  Anemia - Secondary to end-stage renal disease, follow hemoglobin, Neupogen per renal  Hypertension - Not on any medication, actually on midodrine on dialysis days  Chronic systolic CHF/Nonischemic cardiomyopathy  - Last EF 15-20% 12/23/16, that is  post biventricular ICD, volume management with dialysis    Code Status : Full  Family Communication  : None at bedside  Disposition Plan  : Pending further workup  Consults  :  Orthopedic, vascular, renal  Procedures  : None  DVT Prophylaxis  :  Heparin  Lab Results  Component Value Date   PLT 179 01/19/2017    Antibiotics  :   Anti-infectives  Start     Dose/Rate Route Frequency Ordered Stop   01/18/17 1200  vancomycin (VANCOCIN) IVPB 750 mg/150 ml premix     750 mg 150 mL/hr over 60 Minutes Intravenous Every T-Th-Sa (Hemodialysis) 01/17/17 0128     01/17/17 1000  piperacillin-tazobactam (ZOSYN) IVPB 3.375 g     3.375 g 12.5 mL/hr over 240 Minutes Intravenous Every 12 hours 01/17/17 0128     01/17/17 0130  vancomycin (VANCOCIN) 1,250 mg in sodium chloride 0.9 % 250 mL IVPB     1,250 mg 166.7 mL/hr over 90 Minutes Intravenous  Once 01/17/17 0119 01/17/17 0351   01/17/17 0045  vancomycin (VANCOCIN) IVPB 1000 mg/200 mL premix  Status:  Discontinued     1,000 mg 200 mL/hr over 60 Minutes Intravenous  Once 01/17/17 0042 01/17/17 0118   01/17/17 0045  piperacillin-tazobactam (ZOSYN) IVPB 3.375 g     3.375 g 100 mL/hr over 30 Minutes Intravenous  Once 01/17/17 0042 01/17/17 0138        Objective:   Vitals:   01/18/17 2005 01/18/17 2204 01/19/17 0434 01/19/17 0440  BP: (!) 104/58 101/76 99/70   Pulse: 91 92 87   Resp: (!) 23 18 18    Temp: 98 F (36.7 C) 97.9 F (36.6 C) 98 F (36.7 C)   TempSrc: Oral Oral Oral   SpO2: 98% 97% (!) 80% 98%  Weight: 68.5 kg (151 lb 0.2 oz)     Height:        Wt Readings from Last 3 Encounters:  01/18/17 68.5 kg (151 lb 0.2 oz)  01/03/17 67.6 kg (149 lb)  12/23/16 67.9 kg (149 lb 11.1 oz)     Intake/Output Summary (Last 24 hours) at 01/19/17 1329 Last data filed at 01/19/17 0300  Gross per 24 hour  Intake              250 ml  Output             1000 ml  Net             -750 ml     Physical Exam  Awake Alert,  Oriented X 3.  Good air entry bilaterally, clear to auscultation, no use of accessory muscle  Regular rate and rhythm, normal spinal gallops  Abdomen soft, nontender, nondistended, bowel sounds present  Left foot with no pulses in popliteal or pedal area, colds, with gangrene and the third and fourth toe     Data Review:    CBC  Recent Labs Lab 01/16/17 1642 01/17/17 0720 01/18/17 1633 01/19/17 0431  WBC 8.0 5.8 7.3 7.3  HGB 11.6* 11.4* 11.7* 12.0*  HCT 37.2* 38.6* 38.8* 40.4  PLT 310 156 179 179  MCV 89.2 91.3 90.7 90.6  MCH 27.8 27.0 27.3 26.9  MCHC 31.2 29.5* 30.2 29.7*  RDW 17.7* 17.7* 17.9* 17.7*    Chemistries   Recent Labs Lab 01/16/17 1642 01/17/17 0720 01/18/17 1633 01/19/17 0431  NA 139 141 139 137  K 3.4* 3.8 4.3 3.8  CL 95* 97* 96* 95*  CO2 30 32 30 31  GLUCOSE 83 85 103* 102*  BUN 17 22* 34* 14  CREATININE 4.72* 5.99* 7.76* 4.97*  CALCIUM 7.8* 7.5* 7.5* 7.7*   ------------------------------------------------------------------------------------------------------------------ No results for input(s): CHOL, HDL, LDLCALC, TRIG, CHOLHDL, LDLDIRECT in the last 72 hours.  No results found for: HGBA1C ------------------------------------------------------------------------------------------------------------------ No results for input(s): TSH, T4TOTAL, T3FREE, THYROIDAB in the last 72 hours.  Invalid input(s): FREET3 ------------------------------------------------------------------------------------------------------------------ No results for input(s):  VITAMINB12, FOLATE, FERRITIN, TIBC, IRON, RETICCTPCT in the last 72 hours.  Coagulation profile  Recent Labs Lab 01/19/17 0431  INR 1.32    No results for input(s): DDIMER in the last 72 hours.  Cardiac Enzymes No results for input(s): CKMB, TROPONINI, MYOGLOBIN in the last 168 hours.  Invalid input(s):  CK ------------------------------------------------------------------------------------------------------------------ No results found for: BNP  Inpatient Medications  Scheduled Meds: . calcitRIOL  1 mcg Oral Q T,Th,Sa-HD  . cinacalcet  180 mg Oral Q T,Th,Sat-1800  . feeding supplement (NEPRO CARB STEADY)  237 mL Oral BID BM  . gabapentin  100 mg Oral BID  . heparin subcutaneous  5,000 Units Subcutaneous Q8H  . midodrine  5 mg Oral BID WC  . multivitamin  1 tablet Oral QHS  . sevelamer carbonate  3,200 mg Oral TID WC   Continuous Infusions: . sodium chloride    . sodium chloride    . piperacillin-tazobactam (ZOSYN)  IV 3.375 g (01/19/17 1028)  . vancomycin Stopped (01/18/17 2200)   PRN Meds:.sodium chloride, sodium chloride, acetaminophen **OR** acetaminophen, heparin, lidocaine (PF), lidocaine-prilocaine, ondansetron **OR** ondansetron (ZOFRAN) IV, oxyCODONE-acetaminophen, pentafluoroprop-tetrafluoroeth  Micro Results Recent Results (from the past 240 hour(s))  MRSA PCR Screening     Status: None   Collection Time: 01/18/17  4:50 AM  Result Value Ref Range Status   MRSA by PCR NEGATIVE NEGATIVE Final    Comment:        The GeneXpert MRSA Assay (FDA approved for NASAL specimens only), is one component of a comprehensive MRSA colonization surveillance program. It is not intended to diagnose MRSA infection nor to guide or monitor treatment for MRSA infections.   Surgical PCR screen     Status: None   Collection Time: 01/19/17  5:27 AM  Result Value Ref Range Status   MRSA, PCR NEGATIVE NEGATIVE Final   Staphylococcus aureus NEGATIVE NEGATIVE Final    Comment:        The Xpert SA Assay (FDA approved for NASAL specimens in patients over 24 years of age), is one component of a comprehensive surveillance program.  Test performance has been validated by [facility] for patients greater than or equal to 40 year old. It is not intended to diagnose infection nor  to guide or monitor treatment.     Radiology Reports X-ray Chest Pa And Lateral  Result Date: 12/22/2016 CLINICAL DATA:  Presyncope EXAM: CHEST  2 VIEW COMPARISON:  11/27/2016 FINDINGS: Right AICD remains in place, unchanged. Cardiomegaly. Patchy airspace opacity in the right lower lobe. Left lung is clear. No effusions or acute bony abnormality. IMPRESSION: Cardiomegaly. Patchy opacity in the right lower lobe.  Cannot exclude pneumonia. Electronically Signed   By: Charlett Nose M.D.   On: 12/22/2016 09:24   Dg Toe 3rd Left  Result Date: 01/17/2017 CLINICAL DATA:  Acute onset of left foot swelling and pain. Open sore at the left third toe. Initial encounter. EXAM: LEFT THIRD TOE COMPARISON:  None. FINDINGS: The pattern of air at the distal left fourth toe raises question for infection with a gas producing organism, with overlying soft tissue disruption. No definite osseous erosions are characterized to suggest osteomyelitis, though it cannot be excluded on the basis of radiograph. Soft tissue swelling is noted about the toes. There is no evidence of fracture or dislocation. No radiopaque foreign bodies are seen. IMPRESSION: 1. Pattern of air at the distal left fourth toe raises question for infection with a gas producing organism, with overlying soft tissue disruption. Soft  tissue swelling noted about the toes. 2. No definite osseous erosion seen to suggest osteomyelitis, though it cannot be excluded on the basis of radiograph. These results were called by telephone at the time of interpretation on 01/17/2017 at 12:16 am to [facility], who verbally acknowledged these results. Electronically Signed   By: Roanna Raider M.D.   On: 01/17/2017 00:16    Randol Kern, Willie Andrade M.D on 01/19/2017 at 1:29 PM  Between 7am to 7pm - Pager - [phone]  After 7pm go to [email or web address] - password [facility]  [facility] -  Office  [phone]

## 2017-01-19 NOTE — Progress Notes (Signed)
Spoke with Dr. Lajoyce Corners and he is not planning on taking pt to the OR today.  He is scheduled for arteriogram by Dr. Edilia Bo on Monday and pending those results, Dr. Lajoyce Corners will schedule surgery.  Pt may have diet today.  Npo after MN Sunday/consent.   Willie Andrade, [facility] 01/19/2017 8:54 AM

## 2017-01-19 NOTE — Progress Notes (Signed)
  Willie Andrade Progress Note   Subjective:   Seen in room. Denies CP or dyspnea. Still with L foot pain. On IV abx with plan for revascularization on 6/25, per notes.Frustrated about having to stay here  Objective Vitals:   01/18/17 2005 01/18/17 2204 01/19/17 0434 01/19/17 0440  BP: (!) 104/58 101/76 99/70   Pulse: 91 92 87   Resp: (!) 23 18 18    Temp: 98 F (36.7 C) 97.9 F (36.6 C) 98 F (36.7 C)   TempSrc: Oral Oral Oral   SpO2: 98% 97% (!) 80% 98%  Weight: 68.5 kg (151 lb 0.2 oz)     Height:       Physical Exam General: Well appearing male, NAD. Heart: RRR; 2/6 systolic murmur Lungs: CTAB Extremities: No LE edema, + dry gangrene L 2nd/3rd distal toes Dialysis Access: L AVF + thrill/bruit  Additional Objective Labs: Basic Metabolic Panel:  Recent Labs Lab 01/17/17 0720 01/18/17 1633 01/19/17 0431  NA 141 139 137  K 3.8 4.3 3.8  CL 97* 96* 95*  CO2 32 30 31  GLUCOSE 85 103* 102*  BUN 22* 34* 14  CREATININE 5.99* 7.76* 4.97*  CALCIUM 7.5* 7.5* 7.7*  PHOS  --  3.6  --    Liver Function Tests:  Recent Labs Lab 01/18/17 1633  ALBUMIN 2.9*   CBC:  Recent Labs Lab 01/16/17 1642 01/17/17 0720 01/18/17 1633 01/19/17 0431  WBC 8.0 5.8 7.3 7.3  HGB 11.6* 11.4* 11.7* 12.0*  HCT 37.2* 38.6* 38.8* 40.4  MCV 89.2 91.3 90.7 90.6  PLT 310 156 179 179   Medications: . sodium chloride    . sodium chloride    . piperacillin-tazobactam (ZOSYN)  IV 3.375 g (01/19/17 1028)  . vancomycin Stopped (01/18/17 2200)   . calcitRIOL  1 mcg Oral Q T,Th,Sa-HD  . cinacalcet  180 mg Oral Q T,Th,Sat-1800  . feeding supplement (NEPRO CARB STEADY)  237 mL Oral BID BM  . gabapentin  100 mg Oral BID  . heparin subcutaneous  5,000 Units Subcutaneous Q8H  . midodrine  5 mg Oral BID WC  . multivitamin  1 tablet Oral QHS  . sevelamer carbonate  3,200 mg Oral TID WC    Dialysis Orders: TTS at [facility] 3:45h, BFR 400, DFR 800, 2K/2Ca, EDW 68kg, AVF,  Heparin 1400u bolus - Mircera IV q 2 weeks - Calcitriol PO TIW - Sensipar 180mg  PO TIW - Venofer 50mg  IV weekly.  Assessment/Plan: 1. L 2nd/3rd toe wound/?dry gangrene: Ortho and VVS following. On Vanc/Zosyn. Plan is for LLE angiogram/revascularization on 6/25, then possible orthopedic procedure/?amputation if needed. 2. ESRD: Continue TTS schedule, next 6/23. Via AVF 3. BP/volume: Usually hyPOtensive, on midodrine 5mg  BID. No edema.  4. Anemia: Hgb 12. No ESA or iron for now. 5. Secondary hyperparathyroidism:  Corr Ca/Phos ok. Continue calcitriol, sensipar, Renvela. 6. Nutrition: Alb 2.9. Continue Nepro supps. 7. Systolic heart failure (LVEF 15-20%) with ICD: per primary.   Willie Hoyle, Willie Andrade 01/19/2017, 11:22 AM  [facility] Pager: [phone]  Patient seen and examined, agree with above note with above modifications. No new complaints.  Frustrated that he will have to stay the weekend- HD tomorrow- for attempted revasc of RLE on Monday  Willie Sable, MD 01/19/2017

## 2017-01-20 LAB — RENAL FUNCTION PANEL
ANION GAP: 13 (ref 5–15)
Albumin: 3 g/dL — ABNORMAL LOW (ref 3.5–5.0)
Albumin: 2.8 g/dL — ABNORMAL LOW (ref 3.5–5.0)
Anion gap: 17 — ABNORMAL HIGH (ref 5–15)
BUN: 24 mg/dL — AB (ref 6–20)
BUN: 11 mg/dL (ref 6–20)
CALCIUM: 7.6 mg/dL — AB (ref 8.9–10.3)
CHLORIDE: 93 mmol/L — AB (ref 101–111)
CHLORIDE: 92 mmol/L — AB (ref 101–111)
CO2: 25 mmol/L (ref 22–32)
CO2: 28 mmol/L (ref 22–32)
CREATININE: 6.76 mg/dL — AB (ref 0.61–1.24)
Calcium: 7.6 mg/dL — ABNORMAL LOW (ref 8.9–10.3)
Creatinine, Ser: 4.45 mg/dL — ABNORMAL HIGH (ref 0.61–1.24)
GFR calc Af Amer: 9 mL/min — ABNORMAL LOW (ref >60)
GFR calc Af Amer: 15 mL/min — ABNORMAL LOW (ref >60)
GFR calc non Af Amer: 8 mL/min — ABNORMAL LOW (ref >60)
GFR calc non Af Amer: 13 mL/min — ABNORMAL LOW (ref >60)
GLUCOSE: 83 mg/dL (ref 65–99)
GLUCOSE: 107 mg/dL — AB (ref 65–99)
POTASSIUM: 3.5 mmol/L (ref 3.5–5.1)
Phosphorus: 3.4 mg/dL (ref 2.5–4.6)
Phosphorus: 2.8 mg/dL (ref 2.5–4.6)
Potassium: 4.7 mmol/L (ref 3.5–5.1)
SODIUM: 135 mmol/L (ref 135–145)
Sodium: 133 mmol/L — ABNORMAL LOW (ref 135–145)

## 2017-01-20 LAB — CBC
HCT: 42.3 % (ref 39.0–52.0)
Hemoglobin: 12.4 g/dL — ABNORMAL LOW (ref 13.0–17.0)
MCH: 26.5 pg (ref 26.0–34.0)
MCHC: 29.3 g/dL — AB (ref 30.0–36.0)
MCV: 90.4 fL (ref 78.0–100.0)
PLATELETS: 206 10*3/uL (ref 150–400)
RBC: 4.68 MIL/uL (ref 4.22–5.81)
RDW: 18 % — AB (ref 11.5–15.5)
WBC: 6.9 10*3/uL (ref 4.0–10.5)

## 2017-01-20 MED ORDER — OXYCODONE-ACETAMINOPHEN 5-325 MG PO TABS
ORAL_TABLET | ORAL | Status: AC
  Filled 2017-01-20: qty 1

## 2017-01-20 MED ORDER — ACETAMINOPHEN 325 MG PO TABS
ORAL_TABLET | ORAL | Status: AC
  Filled 2017-01-20: qty 2

## 2017-01-20 MED ORDER — VANCOMYCIN HCL IN DEXTROSE 750-5 MG/150ML-% IV SOLN
INTRAVENOUS | Status: AC
  Administered 2017-01-20: 750 mg via INTRAVENOUS
  Filled 2017-01-20: qty 150

## 2017-01-20 MED ORDER — CALCITRIOL 0.5 MCG PO CAPS
ORAL_CAPSULE | ORAL | Status: AC
  Administered 2017-01-20: 1 ug via ORAL
  Filled 2017-01-20: qty 2

## 2017-01-20 NOTE — Progress Notes (Signed)
  Willie Andrade Progress Note   Subjective:   Seen on HD- some low BP.  Denies CP or dyspnea.  L foot pain better with "perc" . On IV abx with plan for revascularization on 6/25, per notes.  Objective Vitals:   01/20/17 0742 01/20/17 0747 01/20/17 0815 01/20/17 0830  BP: (!) 100/59 111/62 (!) 84/54 (!) 81/50  Pulse: 81 81 83 81  Resp:      Temp:      TempSrc:      SpO2:      Weight:      Height:       Physical Exam General: Well appearing male, NAD. Heart: RRR; 2/6 systolic murmur Lungs: CTAB Extremities: No LE edema, + dry gangrene L 2nd/3rd distal toes Dialysis Access: L AVF + thrill/bruit- accessed  Additional Objective Labs: Basic Metabolic Panel:  Recent Labs Lab 01/17/17 0720 01/18/17 1633 01/19/17 0431  NA 141 139 137  K 3.8 4.3 3.8  CL 97* 96* 95*  CO2 32 30 31  GLUCOSE 85 103* 102*  BUN 22* 34* 14  CREATININE 5.99* 7.76* 4.97*  CALCIUM 7.5* 7.5* 7.7*  PHOS  --  3.6  --    Liver Function Tests:  Recent Labs Lab 01/18/17 1633  ALBUMIN 2.9*   CBC:  Recent Labs Lab 01/16/17 1642 01/17/17 0720 01/18/17 1633 01/19/17 0431  WBC 8.0 5.8 7.3 7.3  HGB 11.6* 11.4* 11.7* 12.0*  HCT 37.2* 38.6* 38.8* 40.4  MCV 89.2 91.3 90.7 90.6  PLT 310 156 179 179   Medications: . sodium chloride    . sodium chloride    . piperacillin-tazobactam (ZOSYN)  IV Stopped (01/20/17 0203)  . vancomycin Stopped (01/18/17 2200)   . calcitRIOL  1 mcg Oral Q T,Th,Sa-HD  . cinacalcet  180 mg Oral Q T,Th,Sat-1800  . feeding supplement (NEPRO CARB STEADY)  237 mL Oral BID BM  . gabapentin  100 mg Oral BID  . heparin subcutaneous  5,000 Units Subcutaneous Q8H  . midodrine  5 mg Oral BID WC  . multivitamin  1 tablet Oral QHS  . sevelamer carbonate  3,200 mg Oral TID WC    Dialysis Orders: TTS at [facility] 3:45h, BFR 400, DFR 800, 2K/2Ca, EDW 68kg, AVF, Heparin 1400u bolus - Mircera IV q 2 weeks - Calcitriol PO TIW - Sensipar 180mg  PO  TIW - Venofer 50mg  IV weekly.  Assessment/Plan: 1. L 2nd/3rd toe wound/?dry gangrene: Ortho and VVS following. On Vanc/Zosyn. Plan is for LLE angiogram/revascularization on 6/25, then possible orthopedic procedure/?amputation if needed. 2. ESRD: Continue TTS schedule, on today. Via AVF 3. BP/volume: Usually hyPOtensive, on midodrine 5mg  BID. No edema.  4. Anemia: Hgb 12. No ESA or iron for now. 5. Secondary hyperparathyroidism:  Corr Ca/Phos ok. Continue calcitriol, sensipar, Renvela. 6. Nutrition: Alb 2.9. Continue Nepro supps. 7. Systolic heart failure (LVEF 15-20%) with ICD: per primary.    Willie Sable, MD 01/20/2017

## 2017-01-20 NOTE — Progress Notes (Addendum)
PROGRESS NOTE                                                                                                                                                                                                             Patient Demographics:    Willie Andrade, is a 66 y.o. male, DOB - 03-31-1951, ZOX:[id number]  Admit date - 01/16/2017   Admitting Physician Eduard Clos, MD  Outpatient Primary MD for the patient is [name], MD  LOS - 3   Chief Complaint  Patient presents with  . Leg Swelling       Brief Narrative   66 y.o. male with history of ESRD on hemodialysis, nonischemic cardiomyopathy status post AICD, anemia who was recently admitted for presyncope , admitted 6/19 with left foot gangrene.   Subjective:    Willie Andrade today Complaining of left foot pain, otherwise denies any other complaints, no nausea or vomiting, no chest pain or shortness of breath .   Assessment  & Plan :    Principal Problem:   Cellulitis of left foot Active Problems:   ESRD (end stage renal disease) (HCC)   Chronic systolic heart failure (HCC)   Cellulitis   Gangrene of toe of left foot (HCC)  Left foot cellulitis/dry gangrene - Continue with IV vancomycin/Zosyn coverage,  -  orthopedic consult greatly appreciated, likely will need debridement, amputation After vascular evaluation and intervention. - vascular surgery has seen patient as well, plan for arteriogram with possible intervention this Monday . - Continue with when necessary pain medication  ESRD - Renal consulted to resume dialysis on TTS schedule  Anemia - Secondary to end-stage renal disease, follow hemoglobin, Neupogen per renal  Hypertension - Not on any medication, actually on midodrine on dialysis days  Chronic systolic CHF/Nonischemic cardiomyopathy  - Last EF 15-20% 12/23/16, that is post biventricular ICD, volume management with dialysis    Code Status :  Full  Family Communication  : None at bedside  Disposition Plan  : Pending further workup  Consults  :  Orthopedic, vascular, renal  Procedures  : None  DVT Prophylaxis  :  Heparin  Lab Results  Component Value Date   PLT 206 01/20/2017    Antibiotics  :   Anti-infectives    Start     Dose/Rate Route Frequency Ordered Stop  01/18/17 1200  vancomycin (VANCOCIN) IVPB 750 mg/150 ml premix     750 mg 150 mL/hr over 60 Minutes Intravenous Every T-Th-Sa (Hemodialysis) 01/17/17 0128     01/17/17 1000  piperacillin-tazobactam (ZOSYN) IVPB 3.375 g     3.375 g 12.5 mL/hr over 240 Minutes Intravenous Every 12 hours 01/17/17 0128     01/17/17 0130  vancomycin (VANCOCIN) 1,250 mg in sodium chloride 0.9 % 250 mL IVPB     1,250 mg 166.7 mL/hr over 90 Minutes Intravenous  Once 01/17/17 0119 01/17/17 0351   01/17/17 0045  vancomycin (VANCOCIN) IVPB 1000 mg/200 mL premix  Status:  Discontinued     1,000 mg 200 mL/hr over 60 Minutes Intravenous  Once 01/17/17 0042 01/17/17 0118   01/17/17 0045  piperacillin-tazobactam (ZOSYN) IVPB 3.375 g     3.375 g 100 mL/hr over 30 Minutes Intravenous  Once 01/17/17 0042 01/17/17 0138        Objective:   Vitals:   01/20/17 1015 01/20/17 1030 01/20/17 1100 01/20/17 1128  BP: (!) 108/54 (!) 105/53 (!) 90/44 100/65  Pulse: 77 78 86 86  Resp:    18  Temp:    98 F (36.7 C)  TempSrc:    Oral  SpO2:    97%  Weight:    66.5 kg (146 lb 9.7 oz)  Height:        Wt Readings from Last 3 Encounters:  01/20/17 66.5 kg (146 lb 9.7 oz)  01/03/17 67.6 kg (149 lb)  12/23/16 67.9 kg (149 lb 11.1 oz)     Intake/Output Summary (Last 24 hours) at 01/20/17 1358 Last data filed at 01/20/17 1330  Gross per 24 hour  Intake               50 ml  Output              230 ml  Net             -180 ml     Physical Exam  Awake alert 3, laying comfortably in bed in dialysis.  Good air entry bilaterally, clear to auscultation,  Regular rate and rhythm, no  rubs, gallops Abdomen soft, nontender, nondistended, bowel sounds present  Dry gangrene of left toes     Data Review:    CBC  Recent Labs Lab 01/16/17 1642 01/17/17 0720 01/18/17 1633 01/19/17 0431 01/20/17 0746  WBC 8.0 5.8 7.3 7.3 6.9  HGB 11.6* 11.4* 11.7* 12.0* 12.4*  HCT 37.2* 38.6* 38.8* 40.4 42.3  PLT 310 156 179 179 206  MCV 89.2 91.3 90.7 90.6 90.4  MCH 27.8 27.0 27.3 26.9 26.5  MCHC 31.2 29.5* 30.2 29.7* 29.3*  RDW 17.7* 17.7* 17.9* 17.7* 18.0*    Chemistries   Recent Labs Lab 01/16/17 1642 01/17/17 0720 01/18/17 1633 01/19/17 0431 01/20/17 0746  NA 139 141 139 137 135  K 3.4* 3.8 4.3 3.8 4.7  CL 95* 97* 96* 95* 93*  CO2 30 32 30 31 25   GLUCOSE 83 85 103* 102* 83  BUN 17 22* 34* 14 24*  CREATININE 4.72* 5.99* 7.76* 4.97* 6.76*  CALCIUM 7.8* 7.5* 7.5* 7.7* 7.6*   ------------------------------------------------------------------------------------------------------------------ No results for input(s): CHOL, HDL, LDLCALC, TRIG, CHOLHDL, LDLDIRECT in the last 72 hours.  No results found for: HGBA1C ------------------------------------------------------------------------------------------------------------------ No results for input(s): TSH, T4TOTAL, T3FREE, THYROIDAB in the last 72 hours.  Invalid input(s): FREET3 ------------------------------------------------------------------------------------------------------------------ No results for input(s): VITAMINB12, FOLATE, FERRITIN, TIBC, IRON, RETICCTPCT in the last 72 hours.  Coagulation profile  Recent Labs Lab 01/19/17 0431  INR 1.32    No results for input(s): DDIMER in the last 72 hours.  Cardiac Enzymes No results for input(s): CKMB, TROPONINI, MYOGLOBIN in the last 168 hours.  Invalid input(s): CK ------------------------------------------------------------------------------------------------------------------ No results found for: BNP  Inpatient Medications  Scheduled Meds: .  calcitRIOL  1 mcg Oral Q T,Th,Sa-HD  . cinacalcet  180 mg Oral Q T,Th,Sat-1800  . feeding supplement (NEPRO CARB STEADY)  237 mL Oral BID BM  . gabapentin  100 mg Oral BID  . heparin subcutaneous  5,000 Units Subcutaneous Q8H  . midodrine  5 mg Oral BID WC  . multivitamin  1 tablet Oral QHS  . sevelamer carbonate  3,200 mg Oral TID WC   Continuous Infusions: . piperacillin-tazobactam (ZOSYN)  IV 3.375 g (01/20/17 1313)  . vancomycin Stopped (01/20/17 1120)   PRN Meds:.acetaminophen **OR** acetaminophen, ondansetron **OR** ondansetron (ZOFRAN) IV, oxyCODONE-acetaminophen  Micro Results Recent Results (from the past 240 hour(s))  MRSA PCR Screening     Status: None   Collection Time: 01/18/17  4:50 AM  Result Value Ref Range Status   MRSA by PCR NEGATIVE NEGATIVE Final    Comment:        The GeneXpert MRSA Assay (FDA approved for NASAL specimens only), is one component of a comprehensive MRSA colonization surveillance program. It is not intended to diagnose MRSA infection nor to guide or monitor treatment for MRSA infections.   Surgical PCR screen     Status: None   Collection Time: 01/19/17  5:27 AM  Result Value Ref Range Status   MRSA, PCR NEGATIVE NEGATIVE Final   Staphylococcus aureus NEGATIVE NEGATIVE Final    Comment:        The Xpert SA Assay (FDA approved for NASAL specimens in patients over 78 years of age), is one component of a comprehensive surveillance program.  Test performance has been validated by [facility] for patients greater than or equal to 48 year old. It is not intended to diagnose infection nor to guide or monitor treatment.     Radiology Reports X-ray Chest Pa And Lateral  Result Date: 12/22/2016 CLINICAL DATA:  Presyncope EXAM: CHEST  2 VIEW COMPARISON:  11/27/2016 FINDINGS: Right AICD remains in place, unchanged. Cardiomegaly. Patchy airspace opacity in the right lower lobe. Left lung is clear. No effusions or acute bony abnormality.  IMPRESSION: Cardiomegaly. Patchy opacity in the right lower lobe.  Cannot exclude pneumonia. Electronically Signed   By: Charlett Nose M.D.   On: 12/22/2016 09:24   Dg Toe 3rd Left  Result Date: 01/17/2017 CLINICAL DATA:  Acute onset of left foot swelling and pain. Open sore at the left third toe. Initial encounter. EXAM: LEFT THIRD TOE COMPARISON:  None. FINDINGS: The pattern of air at the distal left fourth toe raises question for infection with a gas producing organism, with overlying soft tissue disruption. No definite osseous erosions are characterized to suggest osteomyelitis, though it cannot be excluded on the basis of radiograph. Soft tissue swelling is noted about the toes. There is no evidence of fracture or dislocation. No radiopaque foreign bodies are seen. IMPRESSION: 1. Pattern of air at the distal left fourth toe raises question for infection with a gas producing organism, with overlying soft tissue disruption. Soft tissue swelling noted about the toes. 2. No definite osseous erosion seen to suggest osteomyelitis, though it cannot be excluded on the basis of radiograph. These results were called by telephone at the  time of interpretation on 01/17/2017 at 12:16 am to [facility] PA, who verbally acknowledged these results. Electronically Signed   By: Roanna Raider M.D.   On: 01/17/2017 00:16    Jaqulyn Chancellor M.D on 01/20/2017 at 1:58 PM  Between 7am to 7pm - Pager - [phone]  After 7pm go to [email or web address] - password [email or web address]  [facility] -  Office  [phone]

## 2017-01-20 NOTE — Procedures (Signed)
Patient was seen on dialysis and the procedure was supervised.  BFR 325  Via AVF BP is  93/53.   Patient appears to be tolerating treatment well  Willie Andrade A 01/20/2017

## 2017-01-20 NOTE — Progress Notes (Signed)
  Progress Note    01/20/2017 10:36 AM * No surgery date entered *  Subjective:  No acute issues, wants percocet  Vitals:   01/20/17 1015 01/20/17 1030  BP: (!) 108/54 (!) 105/53  Pulse: 77 78  Resp:    Temp:      Physical Exam: Aaox3, on hd Palpable femoral pulses bilaterally Stable dry gangrene of left toes  CBC    Component Value Date/Time   WBC 6.9 01/20/2017 0746   RBC 4.68 01/20/2017 0746   HGB 12.4 (L) 01/20/2017 0746   HCT 42.3 01/20/2017 0746   PLT 206 01/20/2017 0746   MCV 90.4 01/20/2017 0746   MCH 26.5 01/20/2017 0746   MCHC 29.3 (L) 01/20/2017 0746   RDW 18.0 (H) 01/20/2017 0746   LYMPHSABS 1.4 08/30/2016 2023   MONOABS 0.5 08/30/2016 2023   EOSABS 0.1 08/30/2016 2023   BASOSABS 0.0 08/30/2016 2023    BMET    Component Value Date/Time   NA 135 01/20/2017 0746   K 4.7 01/20/2017 0746   CL 93 (L) 01/20/2017 0746   CO2 25 01/20/2017 0746   GLUCOSE 83 01/20/2017 0746   BUN 24 (H) 01/20/2017 0746   CREATININE 6.76 (H) 01/20/2017 0746   CALCIUM 7.6 (L) 01/20/2017 0746   GFRNONAA 8 (L) 01/20/2017 0746   GFRAA 9 (L) 01/20/2017 0746    INR    Component Value Date/Time   INR 1.32 01/19/2017 0431    No intake or output data in the 24 hours ending 01/20/17 1036   Assessment:  66 y.o. male is here with dry gangrene of left toes  Plan: Angiogram Monday with possible intervention   Bernadett Milian C. Randie Heinz, MD [facility] Office: [phone] Pager: [phone]  01/20/2017 10:36 AM

## 2017-01-20 NOTE — Progress Notes (Signed)
Pharmacy Antibiotic Note  Willie Andrade is a 66 y.o. male admitted on 01/16/2017 with cellulitis.  Pharmacy has been consulted for Vancomycin/Zosyn dosing. WBC WNL and afebrile. ESRD on HD TTS.   Plan: -Continue vanc 750mg  QHD TTS -Continue zosyn 3.375gm IV Q12H (4 hr inf) -F/u renal plans, C&S, clinical status -Consider pre-HD vanc level before HD Tuesday  Vancomycin 6/20>> Zosyn  6/20>>  6/21 MRSA - NEG  Height: 6\' 2"  (188 cm) Weight: 146 lb 9.7 oz (66.5 kg) IBW/kg (Calculated) : 82.2  Temp (24hrs), Avg:97.8 F (36.6 C), Min:97.5 F (36.4 C), Max:98 F (36.7 C)   Recent Labs Lab 01/16/17 1642 01/17/17 0720 01/18/17 1633 01/19/17 0431 01/20/17 0746  WBC 8.0 5.8 7.3 7.3 6.9  CREATININE 4.72* 5.99* 7.76* 4.97* 6.76*    Estimated Creatinine Clearance: 10.1 mL/min (A) (by C-G formula based on SCr of 6.76 mg/dL (H)).    No Known Allergies   Willie Andrade), PharmD  PGY1 Pharmacy Resident Pager: [phone] 01/20/2017 12:10 PM

## 2017-01-21 NOTE — Progress Notes (Signed)
  Progress Note    01/21/2017 10:28 AM * No surgery date entered *  Subjective: sleeping, no complaints  Vitals:   01/20/17 2146 01/21/17 0544  BP: 99/63 101/70  Pulse: 83 89  Resp: 18 18  Temp: 98 F (36.7 C) 98.4 F (36.9 C)    Physical Exam: Aaox3,  Palpable femoral pulses bilaterally Stable dry gangrene of left toes  CBC    Component Value Date/Time   WBC 6.9 01/20/2017 0746   RBC 4.68 01/20/2017 0746   HGB 12.4 (L) 01/20/2017 0746   HCT 42.3 01/20/2017 0746   PLT 206 01/20/2017 0746   MCV 90.4 01/20/2017 0746   MCH 26.5 01/20/2017 0746   MCHC 29.3 (L) 01/20/2017 0746   RDW 18.0 (H) 01/20/2017 0746   LYMPHSABS 1.4 08/30/2016 2023   MONOABS 0.5 08/30/2016 2023   EOSABS 0.1 08/30/2016 2023   BASOSABS 0.0 08/30/2016 2023    BMET    Component Value Date/Time   NA 133 (L) 01/20/2017 1818   K 3.5 01/20/2017 1818   CL 92 (L) 01/20/2017 1818   CO2 28 01/20/2017 1818   GLUCOSE 107 (H) 01/20/2017 1818   BUN 11 01/20/2017 1818   CREATININE 4.45 (H) 01/20/2017 1818   CALCIUM 7.6 (L) 01/20/2017 1818   GFRNONAA 13 (L) 01/20/2017 1818   GFRAA 15 (L) 01/20/2017 1818    INR    Component Value Date/Time   INR 1.32 01/19/2017 0431     Intake/Output Summary (Last 24 hours) at 01/21/17 1028 Last data filed at 01/21/17 0620  Gross per 24 hour  Intake              400 ml  Output              230 ml  Net              170 ml     Assessment:  66 y.o. male is here with dry gangrene of left toes  Plan: Pv lab for angiogram tomorrow.  Npo at midnight    Eldridge C. Randie Heinz, MD [facility] [place] Office: [phone] Pager: [phone]  01/21/2017 10:28 AM

## 2017-01-21 NOTE — Progress Notes (Signed)
PROGRESS NOTE                                                                                                                                                                                                             Patient Demographics:    Willie Andrade, is a 66 y.o. male, DOB - 08-29-1950, KVQ:[id number]  Admit date - 01/16/2017   Admitting Physician Eduard Clos, MD  Outpatient Primary MD for the patient is [name], MD  LOS - 4   Chief Complaint  Patient presents with  . Leg Swelling       Brief Narrative   66 y.o. male with history of ESRD on hemodialysis, nonischemic cardiomyopathy status post AICD, anemia who was recently admitted for presyncope , admitted 6/19 with left foot gangrene.   Subjective:    Willie Andrade today Complains of left foot pain, but overall controlled, otherwise denies any complaints, no shortness of breath, no chest pain, no palpitation    Assessment  & Plan :    Principal Problem:   Cellulitis of left foot Active Problems:   ESRD (end stage renal disease) (HCC)   Chronic systolic heart failure (HCC)   Cellulitis   Gangrene of toe of left foot (HCC)  Left foot cellulitis/dry gangrene - Continue with IV vancomycin/Zosyn coverage,  -  orthopedic consult greatly appreciated, likely will need debridement, amputation After vascular evaluation and intervention. - vascular surgery has seen patient as well, plan for arteriogram with possible intervention Tomorrow. - Continue with when necessary pain medication  ESRD - Renal consulted to resume dialysis on TTS schedule  Anemia - Secondary to end-stage renal disease, follow hemoglobin, Neupogen per renal  Hypertension - Not on any medication, actually on midodrine on dialysis days  Chronic systolic CHF/Nonischemic cardiomyopathy  - Last EF 15-20% 12/23/16, that is post biventricular ICD, volume management with dialysis, currently appears to be  euvolemic.    Code Status : Full  Family Communication  : None at bedside  Disposition Plan  : Pending further workup  Consults  :  Orthopedic, vascular, renal  Procedures  : None  DVT Prophylaxis  :  Heparin  Lab Results  Component Value Date   PLT 206 01/20/2017    Antibiotics  :   Anti-infectives    Start     Dose/Rate Route Frequency  Ordered Stop   01/18/17 1200  vancomycin (VANCOCIN) IVPB 750 mg/150 ml premix     750 mg 150 mL/hr over 60 Minutes Intravenous Every T-Th-Sa (Hemodialysis) 01/17/17 0128     01/17/17 1000  piperacillin-tazobactam (ZOSYN) IVPB 3.375 g     3.375 g 12.5 mL/hr over 240 Minutes Intravenous Every 12 hours 01/17/17 0128     01/17/17 0130  vancomycin (VANCOCIN) 1,250 mg in sodium chloride 0.9 % 250 mL IVPB     1,250 mg 166.7 mL/hr over 90 Minutes Intravenous  Once 01/17/17 0119 01/17/17 0351   01/17/17 0045  vancomycin (VANCOCIN) IVPB 1000 mg/200 mL premix  Status:  Discontinued     1,000 mg 200 mL/hr over 60 Minutes Intravenous  Once 01/17/17 0042 01/17/17 0118   01/17/17 0045  piperacillin-tazobactam (ZOSYN) IVPB 3.375 g     3.375 g 100 mL/hr over 30 Minutes Intravenous  Once 01/17/17 0042 01/17/17 0138        Objective:   Vitals:   01/20/17 1402 01/20/17 2146 01/21/17 0500 01/21/17 0544  BP: 97/63 99/63  101/70  Pulse: 86 83  89  Resp: 18 18  18   Temp: 98.3 F (36.8 C) 98 F (36.7 C)  98.4 F (36.9 C)  TempSrc: Oral     SpO2: 98% 100%  100%  Weight:   67 kg (147 lb 11.3 oz)   Height:        Wt Readings from Last 3 Encounters:  01/21/17 67 kg (147 lb 11.3 oz)  01/03/17 67.6 kg (149 lb)  12/23/16 67.9 kg (149 lb 11.1 oz)     Intake/Output Summary (Last 24 hours) at 01/21/17 1305 Last data filed at 01/21/17 1044  Gross per 24 hour  Intake              640 ml  Output                0 ml  Net              640 ml     Physical Exam Awake alert 3, laying in bed in no apparent distress  good air entry bilaterally, clear  to auscultation  Heart regular rate and rhythm, no rubs murmurs gallops  Abdomen soft, nontender, nondistended, bowel sounds present  Left toes dry gangrene    Data Review:    CBC  Recent Labs Lab 01/16/17 1642 01/17/17 0720 01/18/17 1633 01/19/17 0431 01/20/17 0746  WBC 8.0 5.8 7.3 7.3 6.9  HGB 11.6* 11.4* 11.7* 12.0* 12.4*  HCT 37.2* 38.6* 38.8* 40.4 42.3  PLT 310 156 179 179 206  MCV 89.2 91.3 90.7 90.6 90.4  MCH 27.8 27.0 27.3 26.9 26.5  MCHC 31.2 29.5* 30.2 29.7* 29.3*  RDW 17.7* 17.7* 17.9* 17.7* 18.0*    Chemistries   Recent Labs Lab 01/17/17 0720 01/18/17 1633 01/19/17 0431 01/20/17 0746 01/20/17 1818  NA 141 139 137 135 133*  K 3.8 4.3 3.8 4.7 3.5  CL 97* 96* 95* 93* 92*  CO2 32 30 31 25 28   GLUCOSE 85 103* 102* 83 107*  BUN 22* 34* 14 24* 11  CREATININE 5.99* 7.76* 4.97* 6.76* 4.45*  CALCIUM 7.5* 7.5* 7.7* 7.6* 7.6*   ------------------------------------------------------------------------------------------------------------------ No results for input(s): CHOL, HDL, LDLCALC, TRIG, CHOLHDL, LDLDIRECT in the last 72 hours.  No results found for: HGBA1C ------------------------------------------------------------------------------------------------------------------ No results for input(s): TSH, T4TOTAL, T3FREE, THYROIDAB in the last 72 hours.  Invalid input(s): FREET3 ------------------------------------------------------------------------------------------------------------------ No results for input(s): VITAMINB12, FOLATE,  FERRITIN, TIBC, IRON, RETICCTPCT in the last 72 hours.  Coagulation profile  Recent Labs Lab 01/19/17 0431  INR 1.32    No results for input(s): DDIMER in the last 72 hours.  Cardiac Enzymes No results for input(s): CKMB, TROPONINI, MYOGLOBIN in the last 168 hours.  Invalid input(s): CK ------------------------------------------------------------------------------------------------------------------ No results found  for: BNP  Inpatient Medications  Scheduled Meds: . calcitRIOL  1 mcg Oral Q T,Th,Sa-HD  . cinacalcet  180 mg Oral Q T,Th,Sat-1800  . feeding supplement (NEPRO CARB STEADY)  237 mL Oral BID BM  . gabapentin  100 mg Oral BID  . heparin subcutaneous  5,000 Units Subcutaneous Q8H  . midodrine  5 mg Oral BID WC  . multivitamin  1 tablet Oral QHS  . sevelamer carbonate  3,200 mg Oral TID WC   Continuous Infusions: . piperacillin-tazobactam (ZOSYN)  IV 3.375 g (01/21/17 1009)  . vancomycin Stopped (01/20/17 1120)   PRN Meds:.acetaminophen **OR** acetaminophen, ondansetron **OR** ondansetron (ZOFRAN) IV, oxyCODONE-acetaminophen  Micro Results Recent Results (from the past 240 hour(s))  MRSA PCR Screening     Status: None   Collection Time: 01/18/17  4:50 AM  Result Value Ref Range Status   MRSA by PCR NEGATIVE NEGATIVE Final    Comment:        The GeneXpert MRSA Assay (FDA approved for NASAL specimens only), is one component of a comprehensive MRSA colonization surveillance program. It is not intended to diagnose MRSA infection nor to guide or monitor treatment for MRSA infections.   Surgical PCR screen     Status: None   Collection Time: 01/19/17  5:27 AM  Result Value Ref Range Status   MRSA, PCR NEGATIVE NEGATIVE Final   Staphylococcus aureus NEGATIVE NEGATIVE Final    Comment:        The Xpert SA Assay (FDA approved for NASAL specimens in patients over 20 years of age), is one component of a comprehensive surveillance program.  Test performance has been validated by [facility] for patients greater than or equal to 25 year old. It is not intended to diagnose infection nor to guide or monitor treatment.     Radiology Reports Dg Toe 3rd Left  Result Date: 01/17/2017 CLINICAL DATA:  Acute onset of left foot swelling and pain. Open sore at the left third toe. Initial encounter. EXAM: LEFT THIRD TOE COMPARISON:  None. FINDINGS: The pattern of air at the distal left  fourth toe raises question for infection with a gas producing organism, with overlying soft tissue disruption. No definite osseous erosions are characterized to suggest osteomyelitis, though it cannot be excluded on the basis of radiograph. Soft tissue swelling is noted about the toes. There is no evidence of fracture or dislocation. No radiopaque foreign bodies are seen. IMPRESSION: 1. Pattern of air at the distal left fourth toe raises question for infection with a gas producing organism, with overlying soft tissue disruption. Soft tissue swelling noted about the toes. 2. No definite osseous erosion seen to suggest osteomyelitis, though it cannot be excluded on the basis of radiograph. These results were called by telephone at the time of interpretation on 01/17/2017 at 12:16 am to [facility] PA, who verbally acknowledged these results. Electronically Signed   By: Roanna Raider M.D.   On: 01/17/2017 00:16    Genean Adamski M.D on 01/21/2017 at 1:05 PM  Between 7am to 7pm - Pager - [phone]  After 7pm go to [email or web address] - password [email or web address]  [facility] -  Office  [phone]     

## 2017-01-21 NOTE — Progress Notes (Signed)
  [facility] Progress Note   Subjective:  Still with foot pain, for LE angiogram tomorrow. No CP or dyspnea. Mild hypotension with HD yesterday, ~1L removed.  Objective Vitals:   01/20/17 1402 01/20/17 2146 01/21/17 0500 01/21/17 0544  BP: 97/63 99/63  101/70  Pulse: 86 83  89  Resp: 18 18  18   Temp: 98.3 F (36.8 C) 98 F (36.7 C)  98.4 F (36.9 C)  TempSrc: Oral     SpO2: 98% 100%  100%  Weight:   67 kg (147 lb 11.3 oz)   Height:       Physical Exam General: Well appearing male, NAD. Heart: RRR; 2/6 systolic murmur Lungs: CTAB Extremities: No LE edema, + dry gangrene L 2nd/3rd distal toes Dialysis Access: L AVF + thrill/bruit  Additional Objective Labs: Basic Metabolic Panel:  Recent Labs Lab 01/18/17 1633 01/19/17 0431 01/20/17 0746 01/20/17 1818  NA 139 137 135 133*  K 4.3 3.8 4.7 3.5  CL 96* 95* 93* 92*  CO2 30 31 25 28   GLUCOSE 103* 102* 83 107*  BUN 34* 14 24* 11  CREATININE 7.76* 4.97* 6.76* 4.45*  CALCIUM 7.5* 7.7* 7.6* 7.6*  PHOS 3.6  --  3.4 2.8   Liver Function Tests:  Recent Labs Lab 01/18/17 1633 01/20/17 0746 01/20/17 1818  ALBUMIN 2.9* 3.0* 2.8*   CBC:  Recent Labs Lab 01/16/17 1642 01/17/17 0720 01/18/17 1633 01/19/17 0431 01/20/17 0746  WBC 8.0 5.8 7.3 7.3 6.9  HGB 11.6* 11.4* 11.7* 12.0* 12.4*  HCT 37.2* 38.6* 38.8* 40.4 42.3  MCV 89.2 91.3 90.7 90.6 90.4  PLT 310 156 179 179 206   Medications: . piperacillin-tazobactam (ZOSYN)  IV Stopped (01/21/17 0126)  . vancomycin Stopped (01/20/17 1120)   . calcitRIOL  1 mcg Oral Q T,Th,Sa-HD  . cinacalcet  180 mg Oral Q T,Th,Sat-1800  . feeding supplement (NEPRO CARB STEADY)  237 mL Oral BID BM  . gabapentin  100 mg Oral BID  . heparin subcutaneous  5,000 Units Subcutaneous Q8H  . midodrine  5 mg Oral BID WC  . multivitamin  1 tablet Oral QHS  . sevelamer carbonate  3,200 mg Oral TID WC    Dialysis Orders: TTS at [facility] 3:45h, BFR 400, DFR 800,  2K/2Ca, EDW 68kg, AVF, Heparin 1400u bolus - Mircera IV q 2 weeks - Calcitriol PO TIW - Sensipar 180mg  PO TIW - Venofer 50mg  IV weekly.  Assessment/Plan: 1. L 2nd/3rd toe wound/?dry gangrene: Ortho and VVS following. On Vanc/Zosyn. Plan is for LLE angiogram/revascularization on 6/25, then possible orthopedic procedure/?amputation if needed. 2. ESRD: Continue TTS schedule, next 6/26 via AVF. 3. BP/volume: Usually hyPOtensive, on midodrine 5mg  BID. No edema.  4. Anemia: Hgb 12.4. No ESA or iron for now. 5. Secondary hyperparathyroidism:  Corr Ca ok, Phos slightly low at 2.8. Continue calcitriol, sensipar, Renvela for now, may need to lower dose if Phos staying low. 6. Nutrition: Alb 2.8. Continue Nepro supps. 7. Systolic heart failure (LVEF 15-20%) with ICD: per primary.   Ozzie Hoyle, PA-C 01/21/2017, 10:02 AM  [facility] Pager: [phone]

## 2017-01-22 ENCOUNTER — Encounter (HOSPITAL_COMMUNITY)
Admission: EM | Disposition: A | Discharge status: Home / Self Care | Payer: Self-pay | Source: Home / Self Care | Attending: Internal Medicine

## 2017-01-22 ENCOUNTER — Encounter: Payer: Medicare Other | Admitting: Cardiology

## 2017-01-22 DIAGNOSIS — I96 Gangrene, not elsewhere classified: Secondary | ICD-10-CM

## 2017-01-22 HISTORY — PX: ABDOMINAL AORTOGRAM W/LOWER EXTREMITY: CATH118223

## 2017-01-22 LAB — BASIC METABOLIC PANEL
Anion gap: 21 — ABNORMAL HIGH (ref 5–15)
BUN: 23 mg/dL — ABNORMAL HIGH (ref 6–20)
CALCIUM: 7.5 mg/dL — AB (ref 8.9–10.3)
CO2: 19 mmol/L — ABNORMAL LOW (ref 22–32)
Chloride: 96 mmol/L — ABNORMAL LOW (ref 101–111)
Creatinine, Ser: 6.83 mg/dL — ABNORMAL HIGH (ref 0.61–1.24)
GFR, EST AFRICAN AMERICAN: 9 mL/min — AB (ref >60)
GFR, EST NON AFRICAN AMERICAN: 7 mL/min — AB (ref >60)
Glucose, Bld: 70 mg/dL (ref 65–99)
POTASSIUM: 5.3 mmol/L — AB (ref 3.5–5.1)
Sodium: 136 mmol/L (ref 135–145)

## 2017-01-22 LAB — CBC
HEMATOCRIT: 43.3 % (ref 39.0–52.0)
HEMOGLOBIN: 13 g/dL (ref 13.0–17.0)
MCH: 27.7 pg (ref 26.0–34.0)
MCHC: 30 g/dL (ref 30.0–36.0)
MCV: 92.3 fL (ref 78.0–100.0)
Platelets: 130 10*3/uL — ABNORMAL LOW (ref 150–400)
RBC: 4.69 MIL/uL (ref 4.22–5.81)
RDW: 18.3 % — ABNORMAL HIGH (ref 11.5–15.5)
WBC: 6.5 10*3/uL (ref 4.0–10.5)

## 2017-01-22 LAB — PROTIME-INR
INR: 1.33
PROTHROMBIN TIME: 16.6 s — AB (ref 11.4–15.2)

## 2017-01-22 SURGERY — ABDOMINAL AORTOGRAM W/LOWER EXTREMITY
Anesthesia: LOCAL

## 2017-01-22 MED ORDER — ACETAMINOPHEN 325 MG PO TABS: 650.0000 mg | ORAL_TABLET | ORAL | Status: DC | PRN

## 2017-01-22 MED ORDER — HEPARIN SODIUM (PORCINE) 1000 UNIT/ML DIALYSIS
1000.0000 [IU] | INTRAMUSCULAR | Status: DC | PRN
  Filled 2017-01-22: qty 1

## 2017-01-22 MED ORDER — FENTANYL CITRATE (PF) 100 MCG/2ML IJ SOLN
INTRAMUSCULAR | Status: AC
  Filled 2017-01-22: qty 2

## 2017-01-22 MED ORDER — FENTANYL CITRATE (PF) 100 MCG/2ML IJ SOLN
INTRAMUSCULAR | Status: DC | PRN
  Administered 2017-01-22: 25 ug via INTRAVENOUS

## 2017-01-22 MED ORDER — OXYCODONE-ACETAMINOPHEN 5-325 MG PO TABS
1.0000 | ORAL_TABLET | ORAL | Status: DC | PRN
  Administered 2017-01-23 – 2017-01-26 (×9): 2 via ORAL
  Filled 2017-01-22 (×8): qty 2

## 2017-01-22 MED ORDER — HEPARIN SODIUM (PORCINE) 5000 UNIT/ML IJ SOLN
5000.0000 [IU] | Freq: Three times a day (TID) | INTRAMUSCULAR | Status: DC
  Administered 2017-01-22 – 2017-01-23 (×3): 5000 [IU] via SUBCUTANEOUS
  Filled 2017-01-22 (×3): qty 1

## 2017-01-22 MED ORDER — ALTEPLASE 2 MG IJ SOLR: 2.0000 mg | Freq: Once | INTRAMUSCULAR | Status: DC | PRN

## 2017-01-22 MED ORDER — HEPARIN (PORCINE) IN NACL 2-0.9 UNIT/ML-% IJ SOLN
INTRAMUSCULAR | Status: AC
  Filled 2017-01-22: qty 1000

## 2017-01-22 MED ORDER — ONDANSETRON HCL 4 MG/2ML IJ SOLN: 4.0000 mg | Freq: Four times a day (QID) | INTRAMUSCULAR | Status: DC | PRN

## 2017-01-22 MED ORDER — IODIXANOL 320 MG/ML IV SOLN
INTRAVENOUS | Status: DC | PRN
  Administered 2017-01-22: 187 mL via INTRA_ARTERIAL

## 2017-01-22 MED ORDER — HEPARIN (PORCINE) IN NACL 2-0.9 UNIT/ML-% IJ SOLN
INTRAMUSCULAR | Status: AC | PRN
  Administered 2017-01-22: 1000 mL

## 2017-01-22 MED ORDER — MORPHINE SULFATE (PF) 2 MG/ML IV SOLN
2.0000 mg | INTRAVENOUS | Status: DC | PRN
Start: 2017-01-22 — End: 2017-01-26
  Administered 2017-01-24 – 2017-01-25 (×3): 2 mg via INTRAVENOUS
  Filled 2017-01-22 (×3): qty 1

## 2017-01-22 MED ORDER — HEPARIN SODIUM (PORCINE) 1000 UNIT/ML DIALYSIS
1400.0000 [IU] | Freq: Once | INTRAMUSCULAR | Status: AC
  Administered 2017-01-23: 1400 [IU] via INTRAVENOUS_CENTRAL

## 2017-01-22 MED ORDER — SODIUM CHLORIDE 0.9 % IV SOLN: 100.0000 mL | INTRAVENOUS | Status: DC | PRN

## 2017-01-22 MED ORDER — SODIUM CHLORIDE 0.9 % IV SOLN: 500.0000 mL | Freq: Once | INTRAVENOUS | Status: DC | PRN

## 2017-01-22 MED ORDER — LIDOCAINE HCL (PF) 1 % IJ SOLN
INTRAMUSCULAR | Status: DC | PRN
  Administered 2017-01-22: 15 mL via INTRADERMAL

## 2017-01-22 MED ORDER — PENTAFLUOROPROP-TETRAFLUOROETH EX AERO: 1.0000 "application " | INHALATION_SPRAY | CUTANEOUS | Status: DC | PRN

## 2017-01-22 MED ORDER — LIDOCAINE HCL (PF) 1 % IJ SOLN
INTRAMUSCULAR | Status: AC
  Filled 2017-01-22: qty 30

## 2017-01-22 MED ORDER — LIDOCAINE-PRILOCAINE 2.5-2.5 % EX CREA
1.0000 "application " | TOPICAL_CREAM | CUTANEOUS | Status: DC | PRN
  Filled 2017-01-22: qty 5

## 2017-01-22 MED ORDER — LIDOCAINE HCL (PF) 1 % IJ SOLN: 5.0000 mL | INTRAMUSCULAR | Status: DC | PRN

## 2017-01-22 SURGICAL SUPPLY — 8 items
CATH OMNI FLUSH 5F 65CM (CATHETERS) ×2 IMPLANT
KIT MICROINTRODUCER STIFF 5F (SHEATH) ×2 IMPLANT
KIT PV (KITS) ×2 IMPLANT
SHEATH PINNACLE 5F 10CM (SHEATH) ×2 IMPLANT
SYR MEDRAD MARK V 150ML (SYRINGE) ×2 IMPLANT
TRANSDUCER W/STOPCOCK (MISCELLANEOUS) ×2 IMPLANT
TRAY PV CATH (CUSTOM PROCEDURE TRAY) ×2 IMPLANT
WIRE HITORQ VERSACORE ST 145CM (WIRE) ×2 IMPLANT

## 2017-01-22 NOTE — Op Note (Signed)
    OPERATIVE REPORT  DATE OF SURGERY: 01/22/2017  PATIENT: Willie Andrade, 66 y.o. male MRN: [id number]  DOB: 28-Apr-1951  PRE-OPERATIVE DIAGNOSIS: Gangrenous changes toes of left foot  POST-OPERATIVE DIAGNOSIS:  Same  PROCEDURE: Aortogram with bilateral lower extremity runoff  SURGEON:  Gretta Began, M.D.  ANESTHESIA:  Fentanyl 25 mg  PLAN OF CARE: Holding area stable   PATIENT DISPOSITION:  PACU - hemodynamically stable  PROCEDURE DETAILS: The patient was taken to the professor cath lab placed supine position where the area both groins prepped draped in sterile fashion. Using local anesthesia and SonoSite station the right common femoral artery was entered with a 18-gauge needle and a guidewire was passed centrally. This was confirmed with fluoroscopy. A 5 French sheath was passed over the guidewire. A pigtail catheter position low the level of renal arteries and a pelvic arteriogram was obtained. This showed diffuse disease throughout the aortoiliac segments but no flow-limiting stenosis. Catheter is withdrawn just below the aortic bifurcation and lower should be runoff was obtained. This revealed the patient had a prior long stent in the proximal and distal superficial femoral artery. Both of these were patent. The popliteal was patent. There was severe disease in the tibial vessels with peroneal being the only runoff vessel with extensive disease. On the left there was a small short stent at the superficial femoral artery this was patent. There was subtotal occlusion at the popliteal artery on the left and then complete occlusion of all tibial vessels. There was reconstitution of a small peroneal artery and the proximal calf that did remain patent down to the ankle. The patient had no immediate complication. The pigtail catheter was removed and the 5 French sheath was held and hemostasis was obtained. The patient was returned to the Cath Lab holding area in stable condition  Findings #1  aortoiliac irregularity with no flow-limiting stenosis #2 patent bilateral superficial femoral artery stents #3 severe tibial disease bilaterally with complete occlusion of all tibial vessels and reconstitution of the peroneal artery on the left. On the right the patient had a disease peroneal that was in line with the popliteal artery.   Gretta Began, M.D. 01/22/2017 3:07 PM

## 2017-01-22 NOTE — Care Management Important Message (Signed)
Important Message  Patient Details  Name: Willie Andrade MRN: [id number] Date of Birth: June 29, 1951   Medicare Important Message Given:  Yes    [name] 01/22/2017, 11:22 AM

## 2017-01-22 NOTE — Progress Notes (Signed)
PROGRESS NOTE                                                                                                                                                                                                             Patient Demographics:    Willie Andrade, is a 66 y.o. male, DOB - 16-Oct-1950, ZHY:[id number]  Admit date - 01/16/2017   Admitting Physician Eduard Clos, MD  Outpatient Primary MD for the patient is [name], MD  LOS - 5   Chief Complaint  Patient presents with  . Leg Swelling       Brief Narrative   66 y.o. male with history of ESRD on hemodialysis, nonischemic cardiomyopathy status post AICD, anemia who was recently admitted for presyncope , admitted 6/19 with left foot gangrene, patient with diminished pulses, on vancomycin and Zosyn, and by vascular, for arteriogram with possible this conversation today, seen by orthopedic Dr. Lajoyce Corners, possible need for surgical intervention) debridement versus amputation) after revascularization.   Subjective:    Willie Andrade today Denies any new complaints, no chest pain, no shortness of breath, no acute events overnight, reports his left foot pain is improved .    Assessment  & Plan :    Principal Problem:   Cellulitis of left foot Active Problems:   ESRD (end stage renal disease) (HCC)   Chronic systolic heart failure (HCC)   Cellulitis   Gangrene of toe of left foot (HCC)  Left foot cellulitis/dry gangrene - Continue with IV vancomycin/Zosyn coverage Empirically. -  orthopedic consult greatly appreciated, likely will need debridement, amputation After vascular evaluation and intervention. - vascular surgery has seen patient as well, plan for arteriogram with possible intervention/revascularization today. 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    Code Status : Full  Family Communication  : None at bedside  Disposition Plan  : Pending further workup  Consults  :  Orthopedic, vascular, renal  Procedures  : None  DVT Prophylaxis  :  Heparin  Lab Results  Component Value Date   PLT 130 (L) 01/22/2017    Antibiotics  :   Anti-infectives    Start     Dose/Rate Route Frequency Ordered Stop   01/18/17 1200  [MAR Hold]  vancomycin (VANCOCIN) IVPB 750 mg/150 ml premix     (MAR Hold since 01/22/17 1145)   750 mg 150 mL/hr over 60 Minutes Intravenous Every T-Th-Sa (Hemodialysis) 01/17/17 0128     01/17/17 1000  [MAR Hold]  piperacillin-tazobactam (ZOSYN) IVPB 3.375 g     (MAR Hold since 01/22/17 1145)   3.375 g 12.5 mL/hr over 240 Minutes Intravenous Every 12 hours 01/17/17 0128     01/17/17 0130  vancomycin (VANCOCIN) 1,250 mg in sodium chloride 0.9 % 250 mL IVPB     1,250 mg 166.7 mL/hr over 90 Minutes Intravenous  Once 01/17/17 0119 01/17/17 0351   01/17/17 0045  vancomycin (VANCOCIN) IVPB 1000 mg/200 mL premix  Status:  Discontinued     1,000 mg 200 mL/hr over 60 Minutes Intravenous  Once 01/17/17 0042 01/17/17 0118   01/17/17 0045  piperacillin-tazobactam (ZOSYN) IVPB 3.375 g     3.375 g 100 mL/hr over 30 Minutes Intravenous  Once 01/17/17 0042 01/17/17 0138        Objective:   Vitals:   01/21/17 2244 01/21/17 2250 01/22/17 0508 01/22/17 1000  BP: 102/67 (!) 104/52 95/73 94/71   Pulse: 82 92 (!) 129 81  Resp: 16 20 16    Temp: 97.6 F (36.4 C) 97.6 F (36.4 C) 97.7 F (36.5 C)   TempSrc:   Oral   SpO2: (!) 78% 100% (!) 76% 100%  Weight:        Height:        Wt Readings from Last 3 Encounters:  01/21/17 67 kg (147 lb 11.3 oz)  01/03/17 67.6 kg (149 lb)  12/23/16 67.9 kg (149 lb 11.1 oz)     Intake/Output Summary (Last 24 hours) at 01/22/17 1228 Last data filed at 01/22/17 1610  Gross per 24 hour  Intake              220 ml  Output                0 ml  Net              220 ml     Physical Exam Awake, alert 3, laying in bed in no apparent distress Good air entry bilaterally, clear to auscultation, no wheezing Regular rate and rhythm, normal ROM, murmur or gallops Abdomen soft, nontender, nondistended, bowel sounds present Left toe dry gangrene appears stable,   Data Review:    CBC  Recent Labs Lab 01/17/17 0720 01/18/17 1633 01/19/17 0431 01/20/17 0746 01/22/17 0619  WBC 5.8 7.3 7.3 6.9 6.5  HGB 11.4* 11.7* 12.0* 12.4* 13.0  HCT 38.6* 38.8* 40.4 42.3 43.3  PLT 156 179 179 206 130*  MCV 91.3 90.7 90.6 90.4 92.3  MCH 27.0 27.3 26.9 26.5 27.7  MCHC 29.5* 30.2 29.7* 29.3* 30.0  RDW 17.7* 17.9* 17.7* 18.0* 18.3*    Chemistries   Recent Labs Lab 01/18/17 1633 01/19/17 0431 01/20/17 0746 01/20/17 1818 01/22/17 0619  NA 139 137 135 133* 136  K 4.3 3.8 4.7 3.5 5.3*  CL 96* 95* 93* 92* 96*  CO2 30 31 25 28  19*  GLUCOSE 103* 102* 83 107* 70  BUN 34*  14 24* 11 23*  CREATININE 7.76* 4.97* 6.76* 4.45* 6.83*  CALCIUM 7.5* 7.7* 7.6* 7.6* 7.5*   ------------------------------------------------------------------------------------------------------------------ No results for input(s): CHOL, HDL, LDLCALC, TRIG, CHOLHDL, LDLDIRECT in the last 72 hours.  No results found for: HGBA1C ------------------------------------------------------------------------------------------------------------------ No results for input(s): TSH, T4TOTAL, T3FREE, THYROIDAB in the last 72 hours.  Invalid input(s):  FREET3 ------------------------------------------------------------------------------------------------------------------ No results for input(s): VITAMINB12, FOLATE, FERRITIN, TIBC, IRON, RETICCTPCT in the last 72 hours.  Coagulation profile  Recent Labs Lab 01/19/17 0431 01/22/17 0619  INR 1.32 1.33    No results for input(s): DDIMER in the last 72 hours.  Cardiac Enzymes No results for input(s): CKMB, TROPONINI, MYOGLOBIN in the last 168 hours.  Invalid input(s): CK ------------------------------------------------------------------------------------------------------------------ No results found for: BNP  Inpatient Medications  Scheduled Meds: . [MAR Hold] calcitRIOL  1 mcg Oral Q T,Th,Sa-HD  . [MAR Hold] cinacalcet  180 mg Oral Q T,Th,Sat-1800  . [MAR Hold] feeding supplement (NEPRO CARB STEADY)  237 mL Oral BID BM  . [MAR Hold] gabapentin  100 mg Oral BID  . [MAR Hold] heparin subcutaneous  5,000 Units Subcutaneous Q8H  . [MAR Hold] midodrine  5 mg Oral BID WC  . [MAR Hold] multivitamin  1 tablet Oral QHS  . [MAR Hold] sevelamer carbonate  3,200 mg Oral TID WC   Continuous Infusions: . [MAR Hold] piperacillin-tazobactam (ZOSYN)  IV 3.375 g (01/22/17 1050)  . [MAR Hold] vancomycin Stopped (01/20/17 1120)   PRN Meds:.[MAR Hold] acetaminophen **OR** [MAR Hold] acetaminophen, fentaNYL, [MAR Hold] ondansetron **OR** [MAR Hold] ondansetron (ZOFRAN) IV, [MAR Hold] oxyCODONE-acetaminophen  Micro Results Recent Results (from the past 240 hour(s))  MRSA PCR Screening     Status: None   Collection Time: 01/18/17  4:50 AM  Result Value Ref Range Status   MRSA by PCR NEGATIVE NEGATIVE Final    Comment:        The GeneXpert MRSA Assay (FDA approved for NASAL specimens only), is one component of a comprehensive MRSA colonization surveillance program. It is not intended to diagnose MRSA infection nor to guide or monitor treatment for MRSA infections.   Surgical PCR  screen     Status: None   Collection Time: 01/19/17  5:27 AM  Result Value Ref Range Status   MRSA, PCR NEGATIVE NEGATIVE Final   Staphylococcus aureus NEGATIVE NEGATIVE Final    Comment:        The Xpert SA Assay (FDA approved for NASAL specimens in patients over 48 years of age), is one component of a comprehensive surveillance program.  Test performance has been validated by [facility] for patients greater than or equal to 32 year old. It is not intended to diagnose infection nor to guide or monitor treatment.     Radiology Reports Dg Toe 3rd Left  Result Date: 01/17/2017 CLINICAL DATA:  Acute onset of left foot swelling and pain. Open sore at the left third toe. Initial encounter. EXAM: LEFT THIRD TOE COMPARISON:  None. FINDINGS: The pattern of air at the distal left fourth toe raises question for infection with a gas producing organism, with overlying soft tissue disruption. No definite osseous erosions are characterized to suggest osteomyelitis, though it cannot be excluded on the basis of radiograph. Soft tissue swelling is noted about the toes. There is no evidence of fracture or dislocation. No radiopaque foreign bodies are seen. IMPRESSION: 1. Pattern of air at the distal left fourth toe raises question for infection with a gas producing organism, with overlying soft tissue  disruption. Soft tissue swelling noted about the toes. 2. No definite osseous erosion seen to suggest osteomyelitis, though it cannot be excluded on the basis of radiograph. These results were called by telephone at the time of interpretation on 01/17/2017 at 12:16 am to [facility] PA, who verbally acknowledged these results. Electronically Signed   By: Roanna Raider M.D.   On: 01/17/2017 00:16    Doyce Saling M.D on 01/22/2017 at 12:28 PM  Between 7am to 7pm - Pager - [phone]  After 7pm go to [email or web address] - password [facility]  [facility] -  Office  [phone]

## 2017-01-22 NOTE — Progress Notes (Addendum)
Site area: rt groin fa sheath Site Prior to Removal:  Level 0 Pressure Applied For: 20 minutes Manual:   yes Patient Status During Pull:  stable Post Pull Site:  Level  0 Post Pull Instructions Given:  yes Post Pull Pulses Present: dopplered Dressing Applied:  Gauze and tegaderm Bedrest begins @  1320 Comments: IV saline locked

## 2017-01-22 NOTE — Care Management Note (Signed)
Case Management Note  Patient Details  Name: Machi Vanpatten MRN: [id number] Date of Birth: [birth year]  Subjective/Objective:        Admitted with  L 2nd/3rd toe wound/dry gangrene, hx of ESRD, TTS .              6/25 - s/p   Aortogram with bilateral lower extremity runoff   Wyatt Haste [facility])    [phone]     PCP: [name]  Action/Plan: Return to home when medically stable. CM following for disposition needs.  Expected Discharge Date:                  Expected Discharge Plan:  Home/Self Care  In-House Referral:     Discharge planning Services  CM Consult  Post Acute Care Choice:    Choice offered to:     DME Arranged:    DME Agency:     HH Arranged:    HH Agency:     Status of Service:  In process, will continue to follow  If discussed at Long Length of Stay Meetings, dates discussed:    Additional Comments:  Epifanio Lesches, RN 01/22/2017, 11:38 PM

## 2017-01-22 NOTE — Progress Notes (Signed)
  [facility] Progress Note   Subjective:  For LE angiogram today. NO c/o's, L foot feeling better on abx  Objective Vitals:   01/21/17 2244 01/21/17 2250 01/22/17 0508 01/22/17 1000  BP: 102/67 (!) 104/52 95/73 94/71   Pulse: 82 92 (!) 129 81  Resp: 16 20 16    Temp: 97.6 F (36.4 C) 97.6 F (36.4 C) 97.7 F (36.5 C)   TempSrc:   Oral   SpO2: (!) 78% 100% (!) 76% 100%  Weight:      Height:       Physical Exam General: Well appearing male, NAD. Heart: RRR; 2/6 systolic murmur Lungs: CTAB Extremities: No LE edema, + dry gangrene L 2/3/4 toes Dialysis Access: L AVF + thrill/bruit   Dialysis: [facility] 3:45h, BFR 400, DFR 800, 2K/2Ca, EDW 68kg, AVF, Heparin 1400u bolus - Mircera IV q 2 weeks - Calcitriol PO TIW - Sensipar 180mg  PO TIW - Venofer 50mg  IV weekly.  Assess/Plan: 1. L 2nd/3rd toe wound/dry gangrene: Ortho and VVS following. On Vanc/Zosyn. Plan is for LLE angiogram/revascularization today.  2. ESRD: Continue TTS schedule, next 6/26 via AVF. 3. BP/volume: Usually hyPOtensive, on midodrine 5mg  BID. No edema. Slightly under dry wt 4. Anemia: Hgb 12.4. No ESA or iron for now. 5. Secondary hyperparathyroidism:  Corr Ca ok, Phos slightly low at 2.8. Continue calcitriol, sensipar, Renvela for now, may need to lower dose if Phos staying low. 6. Nutrition: Alb 2.8. Continue Nepro supps. 7. Systolic heart failure (LVEF 15-20%) with ICD: per primary.    Willie Moselle MD [employer] pgr [phone]   01/22/2017, 11:11 AM      Additional Objective Labs: Basic Metabolic Panel:  Recent Labs Lab 01/18/17 1633  01/20/17 0746 01/20/17 1818 01/22/17 0619  NA 139  < > 135 133* 136  K 4.3  < > 4.7 3.5 5.3*  CL 96*  < > 93* 92* 96*  CO2 30  < > 25 28 19*  GLUCOSE 103*  < > 83 107* 70  BUN 34*  < > 24* 11 23*  CREATININE 7.76*  < > 6.76* 4.45* 6.83*  CALCIUM 7.5*  < > 7.6* 7.6* 7.5*  PHOS 3.6  --  3.4 2.8  --   < > =  values in this interval not displayed. Liver Function Tests:  Recent Labs Lab 01/18/17 1633 01/20/17 0746 01/20/17 1818  ALBUMIN 2.9* 3.0* 2.8*   CBC:  Recent Labs Lab 01/17/17 0720 01/18/17 1633 01/19/17 0431 01/20/17 0746 01/22/17 0619  WBC 5.8 7.3 7.3 6.9 6.5  HGB 11.4* 11.7* 12.0* 12.4* 13.0  HCT 38.6* 38.8* 40.4 42.3 43.3  MCV 91.3 90.7 90.6 90.4 92.3  PLT 156 179 179 206 130*   Medications: . piperacillin-tazobactam (ZOSYN)  IV 3.375 g (01/22/17 1050)  . vancomycin Stopped (01/20/17 1120)   . calcitRIOL  1 mcg Oral Q T,Th,Sa-HD  . cinacalcet  180 mg Oral Q T,Th,Sat-1800  . feeding supplement (NEPRO CARB STEADY)  237 mL Oral BID BM  . gabapentin  100 mg Oral BID  . heparin subcutaneous  5,000 Units Subcutaneous Q8H  . midodrine  5 mg Oral BID WC  . multivitamin  1 tablet Oral QHS  . sevelamer carbonate  3,200 mg Oral TID WC

## 2017-01-22 NOTE — Progress Notes (Signed)
Patient arrived to 2W room 10.  Telemetry monitor applied and CCMD notified.  Patient oriented to unit and room to include cal light and phone.  Will continue to monitor.

## 2017-01-22 NOTE — Progress Notes (Deleted)
Electrophysiology Office Note   Date:  01/22/2017   ID:  Willie Andrade, DOB 02-04-1951, MRN [id number]  PCP:  [name], MD  Cardiologist:  Katrinka Blazing Primary Electrophysiologist:  Earle Troiano Jorja Loa, MD    No chief complaint on file.    History of Present Illness: Willie Andrade is a 66 y.o. male who is being seen today for the evaluation of systolic heart failure s/p ICD at the request of [name], MD. Presenting today for electrophysiology evaluation. 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 He continues to smoke cigarettes. Recently moved to [place] from [place].    Today, he denies symptoms of palpitations, chest pain, shortness of breath, orthopnea, PND, lower extremity edema, claudication, dizziness, presyncope, syncope, bleeding, or neurologic sequela. The patient is tolerating medications without difficulties. He did have an episode of VF which was terminated by ATP in January of this year. He has had short episodes of nonsustained VT that have not required therapy.***   Past Medical History:  Diagnosis Date  . Chronic systolic CHF (congestive heart failure), NYHA class 2 (HCC)   . ESRD (end stage renal disease) on dialysis (HCC)   . Hepatitis B   . HTN (hypertension)   . Hyperlipidemia   . 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 Tobacco abuse    Past Surgical History:  Procedure Laterality Date  . BIV ICD GENERATOR CHANGEOUT N/A 11/28/2016   Procedure: BiV ICD [employer]; St Jude Surgeon: Keymiah Lyles Jorja Loa, MD;  Location: [facility];  Service: Cardiovascular;  Laterality: N/A;     No current facility-administered medications for this visit.    No current outpatient prescriptions on file.   Facility-Administered Medications Ordered in Other Visits    Medication Dose Route Frequency Provider Last Rate Last Dose  . acetaminophen (TYLENOL) tablet 650 mg  650 mg Oral Q6H PRN Eduard Clos, MD   650 mg at 01/20/17 0935   Or  . acetaminophen (TYLENOL) suppository 650 mg  650 mg Rectal Q6H PRN Eduard Clos, MD      . calcitRIOL (ROCALTROL) capsule 1 mcg  1 mcg Oral Q T,Th,Sa-HD Pola Corn, NP   1 mcg at 01/20/17 0750  . cinacalcet (SENSIPAR) tablet 180 mg  180 mg Oral Q T,Th,Sat-1800 Pola Corn, NP   180 mg at 01/20/17 1937  . feeding supplement (NEPRO CARB STEADY) liquid 237 mL  237 mL Oral BID BM Pola Corn, NP   237 mL at 01/20/17 1400  . gabapentin (NEURONTIN) capsule 100 mg  100 mg Oral BID Eduard Clos, MD   100 mg at 01/21/17 2125  . heparin injection 5,000 Units  5,000 Units Subcutaneous Q8H Elgergawy, Leana Roe, MD   5,000 Units at 01/21/17 2132  . midodrine (PROAMATINE) tablet 5 mg  5 mg Oral BID WC Eduard Clos, MD   5 mg at 01/21/17 1753  . multivitamin (RENA-VIT) tablet 1 tablet  1 tablet Oral QHS Pola Corn, NP   1 tablet at 01/21/17 2125  . ondansetron (ZOFRAN) tablet 4 mg  4 mg Oral Q6H PRN Eduard Clos, MD       Or  . ondansetron [facility]) injection 4 mg  4 mg Intravenous Q6H PRN Eduard Clos, MD      . oxyCODONE-acetaminophen (PERCOCET/ROXICET) 5-325 MG per  tablet 1-2 tablet  1-2 tablet Oral Q6H PRN Leda Gauze, NP   1 tablet at 01/22/17 [phone]  . piperacillin-tazobactam (ZOSYN) IVPB 3.375 g  3.375 g Intravenous Q12H Stevphen Rochester, [facility]   Stopped at 01/22/17 0125  . sevelamer carbonate (RENVELA) tablet 3,200 mg  3,200 mg Oral TID WC Pola Corn, NP   3,200 mg at 01/21/17 1754  . vancomycin (VANCOCIN) IVPB 750 mg/150 ml premix  750 mg Intravenous Q T,Th,Sa-HD Stevphen Rochester, RPH   Stopped at 01/20/17 1120    Allergies:   Patient has no known allergies.   Social History:  The patient  reports that he has been smoking  Cigarettes.  He has been smoking about 0.00 packs per day. He has never used smokeless tobacco. He reports that he does not drink alcohol or use drugs.   Family History:  The patient's family history includes Asthma in his brother; Diabetes in his mother; Gout in his maternal grandmother, mother, and sister; Stroke in his father.    ROS:  Please see the history of present illness.   Otherwise, review of systems is positive for ***.   All other systems are reviewed and negative.     PHYSICAL EXAM: VS:  There were no vitals taken for this visit. , BMI There is no height or weight on file to calculate BMI. GEN: Well nourished, well developed, in no acute distress  HEENT: normal  Neck: no JVD, carotid bruits, or masses Cardiac: ***RRR; no murmurs, rubs, or gallops,no edema  Respiratory:  clear to auscultation bilaterally, normal work of breathing GI: soft, nontender, nondistended, + BS MS: no deformity or atrophy  Skin: warm and dry, ***device site well healed Neuro:  Strength and sensation are intact Psych: euthymic mood, full affect  EKG:  EKG {ACTION; IS/IS EQA:[id number]} ordered today. Personal review of the ekg ordered *** shows ***   ***Personal review of the device interrogation today. Results in [facility]   Recent Labs: 12/21/2016: Magnesium 1.9 12/22/2016: ALT 8 01/03/2017: TSH 1.750 01/20/2017: BUN 11; Creatinine, Ser 4.45; Hemoglobin 12.4; Platelets 206; Potassium 3.5; Sodium 133    Lipid Panel  No results found for: CHOL, TRIG, HDL, CHOLHDL, VLDL, LDLCALC, LDLDIRECT   Wt Readings from Last 3 Encounters:  01/21/17 147 lb 11.3 oz (67 kg)  01/03/17 149 lb (67.6 kg)  12/23/16 149 lb 11.1 oz (67.9 kg)      Other studies Reviewed: Additional studies/ records that were reviewed today include: TTE 10/27/16  Review of the above records today demonstrates:  - Left ventricle: The cavity size was moderately dilated. Wall   thickness was normal. Systolic function was severely  reduced. The   estimated ejection fraction was in the range of 15% to 20%.   Diffuse hypokinesis. Doppler parameters are consistent with a   reversible restrictive pattern, indicative of decreased left   ventricular diastolic compliance and/or increased left atrial   pressure (grade 3 diastolic dysfunction). - Ventricular septum: Septal motion showed abnormal function and   dyssynergy. - Aortic valve: Trileaflet; mildly thickened, mildly calcified   leaflets. - Left atrium: The atrium was severely dilated. Volume/bsa, ES   (1-plane Simpson&'s, A4C): 49.2 ml/m^2. - Right ventricle: The cavity size was moderately dilated. Wall   thickness was normal. Systolic function was mildly to moderately   reduced. - Tricuspid valve: There was moderate regurgitation.   ASSESSMENT AND PLAN:  1.  Chronic systolic heart failure: s/p CRT-D. Generator change 11/28/16. ***  2. Hypertension:  Well-controlled***  3. ESRD: on HD***  4. Tobacco abuse: cessation encouraged***  5. Ventricular tachycardia: Last episode of treated ventricular tachycardia was in January that fell into the VF zone. Was treated with ATP. He has had other episodes of nonsustained ventricular tachycardia not requiring therapy. I told him not to drive for 6 months after his episode in January. If he has further episodes of VT, we'll plan to start amiodarone.***  Current medicines are reviewed at length with the patient today.   The patient does not have concerns regarding his medicines.  The following changes were made today:  ***  Labs/ tests ordered today include:  No orders of the defined types were placed in this encounter.    Disposition:   FU with Warda Mcqueary *** months  Signed, Ronit Cranfield Jorja Loa, MD  01/22/2017 7:34 AM     [facility] [address] [place] [address] [phone] (office) [phone] (fax)

## 2017-01-23 ENCOUNTER — Encounter (HOSPITAL_COMMUNITY): Payer: Self-pay | Admitting: Vascular Surgery

## 2017-01-23 ENCOUNTER — Inpatient Hospital Stay (HOSPITAL_COMMUNITY): Payer: Medicare Other

## 2017-01-23 DIAGNOSIS — I953 Hypotension of hemodialysis: Secondary | ICD-10-CM

## 2017-01-23 DIAGNOSIS — L03116 Cellulitis of left lower limb: Secondary | ICD-10-CM

## 2017-01-23 DIAGNOSIS — I10 Essential (primary) hypertension: Secondary | ICD-10-CM

## 2017-01-23 LAB — BASIC METABOLIC PANEL
ANION GAP: 16 — AB (ref 5–15)
BUN: 26 mg/dL — ABNORMAL HIGH (ref 6–20)
CALCIUM: 7.5 mg/dL — AB (ref 8.9–10.3)
CO2: 25 mmol/L (ref 22–32)
Chloride: 95 mmol/L — ABNORMAL LOW (ref 101–111)
Creatinine, Ser: 8.22 mg/dL — ABNORMAL HIGH (ref 0.61–1.24)
GFR calc non Af Amer: 6 mL/min — ABNORMAL LOW (ref >60)
GFR, EST AFRICAN AMERICAN: 7 mL/min — AB (ref >60)
Glucose, Bld: 104 mg/dL — ABNORMAL HIGH (ref 65–99)
POTASSIUM: 5 mmol/L (ref 3.5–5.1)
Sodium: 136 mmol/L (ref 135–145)

## 2017-01-23 LAB — CBC
HEMATOCRIT: 40.8 % (ref 39.0–52.0)
Hemoglobin: 12.3 g/dL — ABNORMAL LOW (ref 13.0–17.0)
MCH: 27 pg (ref 26.0–34.0)
MCHC: 30.1 g/dL (ref 30.0–36.0)
MCV: 89.5 fL (ref 78.0–100.0)
Platelets: 179 10*3/uL (ref 150–400)
RBC: 4.56 MIL/uL (ref 4.22–5.81)
RDW: 18 % — ABNORMAL HIGH (ref 11.5–15.5)
WBC: 8.2 10*3/uL (ref 4.0–10.5)

## 2017-01-23 MED ORDER — MIDODRINE HCL 5 MG PO TABS
10.0000 mg | ORAL_TABLET | Freq: Once | ORAL | Status: AC
  Administered 2017-01-23: 10 mg via ORAL
  Filled 2017-01-23: qty 2

## 2017-01-23 MED ORDER — CEFAZOLIN SODIUM-DEXTROSE 2-4 GM/100ML-% IV SOLN
2.0000 g | INTRAVENOUS | Status: AC
  Administered 2017-01-24: 2 g via INTRAVENOUS
  Filled 2017-01-23: qty 100

## 2017-01-23 NOTE — Progress Notes (Signed)
Patient ID: Willie Andrade, male   DOB: 02/03/1951, 66 y.o.   MRN: [id number] Comfortable this morning. Right groin access site without hematoma. No change in dry gangrenous changes of tips of left toes. Again discussed plan for left femoral to peroneal bypass with saphenous vein tomorrow. Patient understands procedure and is ready to proceed. Will have hemodialysis today. 

## 2017-01-23 NOTE — Progress Notes (Signed)
HD tx initiated via 15Gx2 w/o problem, pull/push/flush w/o problem, VSS, will cont to monitor while on HD tx 

## 2017-01-23 NOTE — Progress Notes (Signed)
Pharmacy Antibiotic Note  Willie Andrade is a 66 y.o. male admitted on 01/16/2017 with cellulitis.  Pharmacy has been consulted for Vancomycin/Zosyn dosing - day #7. WBC WNL and afebrile. ESRD on HD TTS - on schedule/tolerating. Vascular Surgery following - s/p angiogram 6/25, L femoral to peroneal bypass w/ saphenous vein scheduled for 6/27.   Plan: Vancomycin 750mg  IV QHD TTS Zosyn 3.375gm IV Q12H (4 hr inf) F/u Vascular plans, HD schedule/tolerance inpatient F/u LOT post-op 6/27 - Consider pre-HD vanc level this week if to continue  Vancomycin 6/20>> Zosyn 6/20>>  6/21 MRSA - NEG  Height: 6\' 2"  (188 cm) Weight: 186 lb 3.2 oz (84.5 kg) IBW/kg (Calculated) : 82.2  Temp (24hrs), Avg:98.2 F (36.8 C), Min:97.9 F (36.6 C), Max:98.6 F (37 C)   Recent Labs Lab 01/18/17 1633 01/19/17 0431 01/20/17 0746 01/20/17 1818 01/22/17 0619 01/23/17 0316  WBC 7.3 7.3 6.9  --  6.5 8.2  CREATININE 7.76* 4.97* 6.76* 4.45* 6.83* 8.22*    Estimated Creatinine Clearance: 10.3 mL/min (A) (by C-G formula based on SCr of 8.22 mg/dL (H)).    No Known Allergies   Babs Bertin, PharmD, BCPS Clinical Pharmacist Rx Phone # for today: [phone] After 3:30PM, please call Main Rx: [phone] 01/23/2017 10:56 AM

## 2017-01-23 NOTE — Progress Notes (Signed)
Lower Extremity Vein Map  Right Great Saphenous Vein  Segment Diameter Comment  1. Origin 4.6 mm Branch   2. High Thigh 2.2 mm Branch  3. Mid Thigh 2.7 mm   4. Low Thigh 3 mm   5. At Knee 2.8 mm   6. High Calf 2.6 mm Branch  7. Low Calf 1.9 mm   8. Ankle 1.9 mm     Left Great Saphenous Vein  Segment Diameter Comment  1. Origin 5 mm Branch  2. High Thigh 2 mm   3. Mid Thigh 1.8 mm   4. Low Thigh 2.2 mm   5. At Knee 3.3 mm Branch  6. High Calf 2.7 mm Branch  7. Low Calf 3.4 mm Branch  8. Ankle 3.3 mm     01/23/17 9:40 AM Olen Cordial RVT

## 2017-01-23 NOTE — Progress Notes (Signed)
[facility] Progress Note   Subjective:  For bypass surgery to left leg tomorrow. Pt w/o c/o's.   Objective Vitals:   01/22/17 1635 01/22/17 1704 01/22/17 2032 01/23/17 0404  BP: 98/75 95/66 97/68  (!) 86/56  Pulse:  79 72 78  Resp: (!) 9 18 19 19   Temp:  98.2 F (36.8 C) 98.6 F (37 C) 97.9 F (36.6 C)  TempSrc:  Oral Oral Axillary  SpO2:  97% 95% 97%  Weight:    84.5 kg (186 lb 3.2 oz)  Height:       Physical Exam General: Well appearing male, NAD. Heart: RRR; 2/6 systolic murmur Lungs: CTAB Extremities: No LE edema, + dry gangrene L 2/3/4 toes Dialysis Access: L AVF + thrill/bruit   Dialysis: [facility] 3:45h, BFR 400, DFR 800, 2K/2Ca, EDW 68kg, AVF, Heparin 1400u bolus - Mircera IV q 2 weeks - Calcitriol PO TIW - Sensipar 180mg  PO TIW - Venofer 50mg  IV weekly.  Assess/Plan: 1. L 2nd/3rd toe wound/dry gangrene: on IV abx. For surgery to bypass LLE vasc disease tomorrow 2. ESRD: Continue TTS schedule, next HD today.  3. BP/volume: Usually hyPOtensive, on midodrine 5mg  BID. No edema. Slightly under dry wt 4. Anemia: Hgb 12.4. No ESA or iron for now. 5. Secondary hyperparathyroidism:  Corr Ca ok, Phos slightly low at 2.8. Continue calcitriol, sensipar, Renvela for now, may need to lower dose if Phos staying low. 6. Nutrition: Alb 2.8. Continue Nepro supps. 7. Systolic heart failure (LVEF 15-20%) with ICD: per primary.    Willie Moselle MD [employer] pgr [phone]   01/23/2017, 1:30 PM      Additional Objective Labs: Basic Metabolic Panel:  Recent Labs Lab 01/18/17 1633  01/20/17 0746 01/20/17 1818 01/22/17 0619 01/23/17 0316  NA 139  < > 135 133* 136 136  K 4.3  < > 4.7 3.5 5.3* 5.0  CL 96*  < > 93* 92* 96* 95*  CO2 30  < > 25 28 19* 25  GLUCOSE 103*  < > 83 107* 70 104*  BUN 34*  < > 24* 11 23* 26*  CREATININE 7.76*  < > 6.76* 4.45* 6.83* 8.22*  CALCIUM 7.5*  < > 7.6* 7.6* 7.5* 7.5*  PHOS 3.6  --  3.4  2.8  --   --   < > = values in this interval not displayed. Liver Function Tests:  Recent Labs Lab 01/18/17 1633 01/20/17 0746 01/20/17 1818  ALBUMIN 2.9* 3.0* 2.8*   CBC:  Recent Labs Lab 01/18/17 1633 01/19/17 0431 01/20/17 0746 01/22/17 0619 01/23/17 0316  WBC 7.3 7.3 6.9 6.5 8.2  HGB 11.7* 12.0* 12.4* 13.0 12.3*  HCT 38.8* 40.4 42.3 43.3 40.8  MCV 90.7 90.6 90.4 92.3 89.5  PLT 179 179 206 130* 179   Medications: . sodium chloride    . sodium chloride    . sodium chloride    .  ceFAZolin (ANCEF) IV    . piperacillin-tazobactam (ZOSYN)  IV Stopped (01/23/17 0122)  . vancomycin Stopped (01/20/17 1120)   . calcitRIOL  1 mcg Oral Q T,Th,Sa-HD  . cinacalcet  180 mg Oral Q T,Th,Sat-1800  . feeding supplement (NEPRO CARB STEADY)  237 mL Oral BID BM  . gabapentin  100 mg Oral BID  . heparin  1,400 Units Dialysis Once in dialysis  . heparin subcutaneous  5,000 Units Subcutaneous Q8H  . midodrine  5 mg Oral BID WC  . multivitamin  1 tablet Oral  QHS  . sevelamer carbonate  3,200 mg Oral TID WC

## 2017-01-23 NOTE — Care Management Note (Signed)
Case Management Note Previous CM note initiated by Epifanio Lesches, RN 01/22/2017, 11:38 PM   Patient Details  Name: Willie Andrade MRN: [id number] Date of Birth: Apr 27, 1951  Subjective/Objective:        Admitted with  L 2nd/3rd toe wound/dry gangrene, hx of ESRD, TTS .              6/25 - s/p   Aortogram with bilateral lower extremity runoff   Wyatt Haste [facility])    [phone]     PCP: [name]  Action/Plan: Return to home when medically stable. CM following for disposition needs.  Expected Discharge Date:                  Expected Discharge Plan:  Home/Self Care  In-House Referral:     Discharge planning Services  CM Consult  Post Acute Care Choice:    Choice offered to:     DME Arranged:    DME Agency:     HH Arranged:    HH Agency:     Status of Service:  In process, will continue to follow  If discussed at Long Length of Stay Meetings, dates discussed:    Discharge Disposition:   Additional Comments:  01/23/17- 1530- Donn Pierini RN, CM- plan for left fem/peroneal bypass on 01/24/17- per Dr. Arbie Cookey- CM to continue to follow for d/c needs- PTA pt lived at home with sister  Darrold Span, RN 01/23/2017, 3:41 PM [phone]

## 2017-01-23 NOTE — Progress Notes (Signed)
PROGRESS NOTE    Willie Andrade  RUE:[id number] DOB: 11-25-1950 DOA: 01/16/2017 PCP: [name], MD   Chief Complaint  Patient presents with  . Leg Swelling    Brief Narrative:  HPI on 01/17/2017 by Dr. Midge Minium  Willie Andrade is a 66 y.o. male with history of ESRD on hemodialysis, nonischemic cardiomyopathy status post AICD, anemia who was recently admitted for presyncope presents to the ER because of worsening swelling and pain in the left foot. Patient has been having these symptoms for last 24 hours. Has been a subjective feeling of fever and chills. Denies any trauma or insect bite.   Interim history Admitted 01/16/17 with left foot gangrene and diminished pulses. Placed on vancomycin and zosyn. Vascular surgery consulted, s/p Arteriogram with bilateral lower extremity runoff. Assessment & Plan   Left foot cellulitis/dry gangrene -Patient placed on IV vancomycin and Zosyn -Orthopedic surgery consulted and appreciated, patient may need debridement or amputation after vascular intervention -Vascular surgery consulted and appreciated -S/p Arteriogram with bilateral lower extremity runoff on 01/22/17: Aortoiliac irregularity with no flow-limiting stenosis, patent bilateral superficial femoral artery stents, severe tibial disease bilaterally with complete occlusion of all tibial vessels and reconstruction of the peritoneal artery on the left. On the right, the patient had a disease peroneal that was in line with popliteal artery. 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 Pending surgery on 01/24/17  Consultants Vascular surgery Orthopedic surgery Nephrology  Procedures  Arteriogram   Antibiotics   Anti-infectives    Start     Dose/Rate Route Frequency Ordered Stop   01/23/17 0915  ceFAZolin (ANCEF) IVPB 2g/100 mL premix    Comments:  Send with pt to OR   2 g 200 mL/hr over 30 Minutes Intravenous To [facility]Stay Surgical 01/23/17 0910 01/24/17 0915   01/18/17 1200  vancomycin (VANCOCIN) IVPB 750 mg/150 ml premix     750 mg 150 mL/hr over 60 Minutes Intravenous Every T-Th-Sa (Hemodialysis) 01/17/17 0128     01/17/17 1000  piperacillin-tazobactam (ZOSYN) IVPB 3.375 g     3.375 g 12.5 mL/hr over 240 Minutes Intravenous Every 12 hours 01/17/17 0128     01/17/17 0130  vancomycin (VANCOCIN) 1,250 mg in sodium chloride 0.9 % 250 mL IVPB     1,250 mg 166.7 mL/hr over 90 Minutes Intravenous  Once 01/17/17 0119 01/17/17 0351   01/17/17 0045  vancomycin (VANCOCIN) IVPB 1000 mg/200 mL premix  Status:  Discontinued     1,000 mg 200 mL/hr over 60 Minutes Intravenous  Once 01/17/17 0042 01/17/17 0118   01/17/17 0045  piperacillin-tazobactam (ZOSYN) IVPB 3.375 g     3.375 g 100 mL/hr over 30 Minutes Intravenous  Once 01/17/17 0042 01/17/17 0138      Subjective:   Willie Andrade seen and examined today.  Patient has no complaints   Objective:   Vitals:   01/22/17 1635 01/22/17  1704 01/22/17 2032 01/23/17 0404  BP: 98/75 95/66 97/68  (!) 86/56  Pulse:  79 72 78  Resp: (!) 9 18 19 19   Temp:  98.2 F (36.8 C) 98.6 F (37 C) 97.9 F (36.6 C)  TempSrc:  Oral Oral  Axillary  SpO2:  97% 95% 97%  Weight:    84.5 kg (186 lb 3.2 oz)  Height:       No intake or output data in the 24 hours ending 01/23/17 1053 Filed Weights   01/20/17 1128 01/21/17 0500 01/23/17 0404  Weight: 66.5 kg (146 lb 9.7 oz) 67 kg (147 lb 11.3 oz) 84.5 kg (186 lb 3.2 oz)   Exam  General: Well developed, well nourished, NAD, appears stated age  HEENT: NCAT, mucous membranes moist.   Cardiovascular: S1 S2 auscultated, 2/6SEM, RRR  Respiratory: Clear to auscultation bilaterally with equal chest rise  Abdomen: Soft, nontender, nondistended, + bowel sounds  Extremities: warm dry without cyanosis clubbing or edema. Left toes- dry gangrene. L AVF +B/T  Neuro: AAOx3, nonfocal  Psych: Normal affect and demeanor with intact judgement and insight   Data Reviewed: I have personally reviewed following labs and imaging studies  CBC:  Recent Labs Lab 01/18/17 1633 01/19/17 0431 01/20/17 0746 01/22/17 0619 01/23/17 0316  WBC 7.3 7.3 6.9 6.5 8.2  HGB 11.7* 12.0* 12.4* 13.0 12.3*  HCT 38.8* 40.4 42.3 43.3 40.8  MCV 90.7 90.6 90.4 92.3 89.5  PLT 179 179 206 130* 179   Basic Metabolic Panel:  Recent Labs Lab 01/18/17 1633 01/19/17 0431 01/20/17 0746 01/20/17 1818 01/22/17 0619 01/23/17 0316  NA 139 137 135 133* 136 136  K 4.3 3.8 4.7 3.5 5.3* 5.0  CL 96* 95* 93* 92* 96* 95*  CO2 30 31 25 28  19* 25  GLUCOSE 103* 102* 83 107* 70 104*  BUN 34* 14 24* 11 23* 26*  CREATININE 7.76* 4.97* 6.76* 4.45* 6.83* 8.22*  CALCIUM 7.5* 7.7* 7.6* 7.6* 7.5* 7.5*  PHOS 3.6  --  3.4 2.8  --   --    GFR: Estimated Creatinine Clearance: 10.3 mL/min (A) (by C-G formula based on SCr of 8.22 mg/dL (H)). Liver Function Tests:  Recent Labs Lab 01/18/17 1633 01/20/17 0746 01/20/17 1818  ALBUMIN 2.9* 3.0* 2.8*   No results for input(s): LIPASE, AMYLASE in the last 168 hours. No results for input(s): AMMONIA in the last 168 hours. Coagulation Profile:  Recent Labs Lab  01/19/17 0431 01/22/17 0619  INR 1.32 1.33   Cardiac Enzymes: No results for input(s): CKTOTAL, CKMB, CKMBINDEX, TROPONINI in the last 168 hours. BNP (last 3 results) No results for input(s): PROBNP in the last 8760 hours. HbA1C: No results for input(s): HGBA1C in the last 72 hours. CBG: No results for input(s): GLUCAP in the last 168 hours. Lipid Profile: No results for input(s): CHOL, HDL, LDLCALC, TRIG, CHOLHDL, LDLDIRECT in the last 72 hours. Thyroid Function Tests: No results for input(s): TSH, T4TOTAL, FREET4, T3FREE, THYROIDAB in the last 72 hours. Anemia Panel: No results for input(s): VITAMINB12, FOLATE, FERRITIN, TIBC, IRON, RETICCTPCT in the last 72 hours. Urine analysis: No results found for: COLORURINE, APPEARANCEUR, LABSPEC, PHURINE, GLUCOSEU, HGBUR, BILIRUBINUR, KETONESUR, PROTEINUR, UROBILINOGEN, NITRITE, LEUKOCYTESUR Sepsis Labs: @LABRCNTIP (procalcitonin:4,lacticidven:4)  ) Recent Results (from the past 240 hour(s))  MRSA PCR Screening     Status: None   Collection Time: 01/18/17  4:50 AM  Result Value Ref Range Status   MRSA by PCR NEGATIVE NEGATIVE Final    Comment:  The GeneXpert MRSA Assay (FDA approved for NASAL specimens only), is one component of a comprehensive MRSA colonization surveillance program. It is not intended to diagnose MRSA infection nor to guide or monitor treatment for MRSA infections.   Surgical PCR screen     Status: None   Collection Time: 01/19/17  5:27 AM  Result Value Ref Range Status   MRSA, PCR NEGATIVE NEGATIVE Final   Staphylococcus aureus NEGATIVE NEGATIVE Final    Comment:        The Xpert SA Assay (FDA approved for NASAL specimens in patients over 11 years of age), is one component of a comprehensive surveillance program.  Test performance has been validated by [facility] for patients greater than or equal to 55 year old. It is not intended to diagnose infection nor to guide or monitor treatment.        Radiology Studies: No results found.   Scheduled Meds: . calcitRIOL  1 mcg Oral Q T,Th,Sa-HD  . cinacalcet  180 mg Oral Q T,Th,Sat-1800  . feeding supplement (NEPRO CARB STEADY)  237 mL Oral BID BM  . gabapentin  100 mg Oral BID  . heparin  1,400 Units Dialysis Once in dialysis  . heparin subcutaneous  5,000 Units Subcutaneous Q8H  . midodrine  5 mg Oral BID WC  . multivitamin  1 tablet Oral QHS  . sevelamer carbonate  3,200 mg Oral TID WC   Continuous Infusions: . sodium chloride    . sodium chloride    . sodium chloride    .  ceFAZolin (ANCEF) IV    . piperacillin-tazobactam (ZOSYN)  IV Stopped (01/23/17 0122)  . vancomycin Stopped (01/20/17 1120)     LOS: 6 days   Time Spent in minutes   30 minutes  Willie Andrade D.O. on 01/23/2017 at 10:53 AM  Between 7am to 7pm - Pager - [phone]  After 7pm go to [email or web address] - password [email or web address]  And look for the night coverage person covering for me after hours  [facility] Office  [phone]

## 2017-01-24 ENCOUNTER — Encounter (HOSPITAL_COMMUNITY)
Admission: EM | Disposition: A | Discharge status: Home / Self Care | Payer: Self-pay | Source: Home / Self Care | Attending: Internal Medicine

## 2017-01-24 ENCOUNTER — Inpatient Hospital Stay (HOSPITAL_COMMUNITY): Payer: Medicare Other | Admitting: Certified Registered Nurse Anesthetist

## 2017-01-24 ENCOUNTER — Encounter: Payer: Self-pay | Admitting: Cardiology

## 2017-01-24 DIAGNOSIS — L0390 Cellulitis, unspecified: Secondary | ICD-10-CM

## 2017-01-24 HISTORY — PX: BYPASS GRAFT FEMORAL-PERONEAL: SHX5762

## 2017-01-24 LAB — BASIC METABOLIC PANEL
ANION GAP: 17 — AB (ref 5–15)
BUN: 12 mg/dL (ref 6–20)
CHLORIDE: 96 mmol/L — AB (ref 101–111)
CO2: 23 mmol/L (ref 22–32)
Calcium: 7.4 mg/dL — ABNORMAL LOW (ref 8.9–10.3)
Creatinine, Ser: 5.23 mg/dL — ABNORMAL HIGH (ref 0.61–1.24)
GFR calc non Af Amer: 10 mL/min — ABNORMAL LOW (ref >60)
GFR, EST AFRICAN AMERICAN: 12 mL/min — AB (ref >60)
GLUCOSE: 62 mg/dL — AB (ref 65–99)
POTASSIUM: 4.5 mmol/L (ref 3.5–5.1)
Sodium: 136 mmol/L (ref 135–145)

## 2017-01-24 LAB — CBC
HEMATOCRIT: 40.1 % (ref 39.0–52.0)
HEMOGLOBIN: 12.1 g/dL — AB (ref 13.0–17.0)
MCH: 27.4 pg (ref 26.0–34.0)
MCHC: 30.2 g/dL (ref 30.0–36.0)
MCV: 90.9 fL (ref 78.0–100.0)
Platelets: 144 10*3/uL — ABNORMAL LOW (ref 150–400)
RBC: 4.41 MIL/uL (ref 4.22–5.81)
RDW: 18.2 % — ABNORMAL HIGH (ref 11.5–15.5)
WBC: 8.6 10*3/uL (ref 4.0–10.5)

## 2017-01-24 LAB — PROTIME-INR
INR: 1.28
PROTHROMBIN TIME: 16 s — AB (ref 11.4–15.2)

## 2017-01-24 SURGERY — CREATION, BYPASS, ARTERIAL, FEMORAL TO PERONEAL, USING GRAFT
Anesthesia: General | Site: Leg Lower | Laterality: Left

## 2017-01-24 MED ORDER — PHENYLEPHRINE 40 MCG/ML (10ML) SYRINGE FOR IV PUSH (FOR BLOOD PRESSURE SUPPORT)
PREFILLED_SYRINGE | INTRAVENOUS | Status: AC
  Filled 2017-01-24: qty 10

## 2017-01-24 MED ORDER — HYDROMORPHONE HCL 1 MG/ML IJ SOLN
INTRAMUSCULAR | Status: AC
  Filled 2017-01-24: qty 0.5

## 2017-01-24 MED ORDER — EPHEDRINE SULFATE 50 MG/ML IJ SOLN
INTRAMUSCULAR | Status: DC | PRN
  Administered 2017-01-24: 10 mg via INTRAVENOUS

## 2017-01-24 MED ORDER — MEPERIDINE HCL 25 MG/ML IJ SOLN: 6.2500 mg | INTRAMUSCULAR | Status: DC | PRN

## 2017-01-24 MED ORDER — SODIUM CHLORIDE 0.9 % IV SOLN
500.0000 mL | Freq: Once | INTRAVENOUS | Status: AC | PRN
  Administered 2017-01-24: 500 mL via INTRAVENOUS

## 2017-01-24 MED ORDER — SODIUM CHLORIDE 0.9 % IV SOLN
INTRAVENOUS | Status: DC | PRN
  Administered 2017-01-24: 08:00:00 500 mL

## 2017-01-24 MED ORDER — HEPARIN SODIUM (PORCINE) 5000 UNIT/ML IJ SOLN
5000.0000 [IU] | Freq: Three times a day (TID) | INTRAMUSCULAR | Status: DC
  Administered 2017-01-25 – 2017-01-26 (×5): 5000 [IU] via SUBCUTANEOUS
  Filled 2017-01-24 (×5): qty 1

## 2017-01-24 MED ORDER — EPHEDRINE 5 MG/ML INJ
INTRAVENOUS | Status: AC
  Filled 2017-01-24: qty 10

## 2017-01-24 MED ORDER — ETOMIDATE 2 MG/ML IV SOLN
INTRAVENOUS | Status: DC | PRN
  Administered 2017-01-24: 10 mg via INTRAVENOUS

## 2017-01-24 MED ORDER — ETOMIDATE 2 MG/ML IV SOLN
INTRAVENOUS | Status: AC
  Filled 2017-01-24: qty 10

## 2017-01-24 MED ORDER — FENTANYL CITRATE (PF) 250 MCG/5ML IJ SOLN
INTRAMUSCULAR | Status: AC
  Filled 2017-01-24: qty 5

## 2017-01-24 MED ORDER — PHENYLEPHRINE HCL 10 MG/ML IJ SOLN
INTRAMUSCULAR | Status: AC
  Filled 2017-01-24: qty 2

## 2017-01-24 MED ORDER — VANCOMYCIN HCL IN DEXTROSE 750-5 MG/150ML-% IV SOLN
INTRAVENOUS | Status: AC
  Administered 2017-01-24: 750 mg via INTRAVENOUS
  Filled 2017-01-24: qty 150

## 2017-01-24 MED ORDER — PROTAMINE SULFATE 10 MG/ML IV SOLN
INTRAVENOUS | Status: DC | PRN
  Administered 2017-01-24: 50 mg via INTRAVENOUS

## 2017-01-24 MED ORDER — 0.9 % SODIUM CHLORIDE (POUR BTL) OPTIME
TOPICAL | Status: DC | PRN
  Administered 2017-01-24: 2000 mL

## 2017-01-24 MED ORDER — FENTANYL CITRATE (PF) 100 MCG/2ML IJ SOLN
INTRAMUSCULAR | Status: DC | PRN
  Administered 2017-01-24: 200 ug via INTRAVENOUS
  Administered 2017-01-24: 50 ug via INTRAVENOUS

## 2017-01-24 MED ORDER — LIDOCAINE 2% (20 MG/ML) 5 ML SYRINGE
INTRAMUSCULAR | Status: DC | PRN
  Administered 2017-01-24: 100 mg via INTRAVENOUS

## 2017-01-24 MED ORDER — ONDANSETRON HCL 4 MG/2ML IJ SOLN
INTRAMUSCULAR | Status: DC | PRN
  Administered 2017-01-24: 4 mg via INTRAVENOUS

## 2017-01-24 MED ORDER — PROTAMINE SULFATE 10 MG/ML IV SOLN
INTRAVENOUS | Status: AC
  Filled 2017-01-24: qty 5

## 2017-01-24 MED ORDER — HEPARIN SODIUM (PORCINE) 1000 UNIT/ML IJ SOLN
INTRAMUSCULAR | Status: AC
  Filled 2017-01-24: qty 1

## 2017-01-24 MED ORDER — PHENYLEPHRINE 40 MCG/ML (10ML) SYRINGE FOR IV PUSH (FOR BLOOD PRESSURE SUPPORT)
PREFILLED_SYRINGE | INTRAVENOUS | Status: DC | PRN
  Administered 2017-01-24: 80 ug via INTRAVENOUS

## 2017-01-24 MED ORDER — PROPOFOL 10 MG/ML IV BOLUS
INTRAVENOUS | Status: AC
  Filled 2017-01-24: qty 20

## 2017-01-24 MED ORDER — DEXAMETHASONE SODIUM PHOSPHATE 10 MG/ML IJ SOLN
INTRAMUSCULAR | Status: AC
  Filled 2017-01-24: qty 1

## 2017-01-24 MED ORDER — HYDROMORPHONE HCL 1 MG/ML IJ SOLN
0.2500 mg | INTRAMUSCULAR | Status: DC | PRN
  Administered 2017-01-24 (×2): 0.5 mg via INTRAVENOUS

## 2017-01-24 MED ORDER — SODIUM CHLORIDE 0.9 % IV SOLN
INTRAVENOUS | Status: DC | PRN
  Administered 2017-01-24 (×3): via INTRAVENOUS

## 2017-01-24 MED ORDER — PROTAMINE SULFATE 10 MG/ML IV SOLN
INTRAVENOUS | Status: AC
  Filled 2017-01-24: qty 25

## 2017-01-24 MED ORDER — SUCCINYLCHOLINE CHLORIDE 200 MG/10ML IV SOSY
PREFILLED_SYRINGE | INTRAVENOUS | Status: DC | PRN
  Administered 2017-01-24: 100 mg via INTRAVENOUS

## 2017-01-24 MED ORDER — ONDANSETRON HCL 4 MG/2ML IJ SOLN
INTRAMUSCULAR | Status: AC
  Filled 2017-01-24: qty 2

## 2017-01-24 MED ORDER — DEXAMETHASONE SODIUM PHOSPHATE 10 MG/ML IJ SOLN
INTRAMUSCULAR | Status: DC | PRN
  Administered 2017-01-24: 10 mg via INTRAVENOUS

## 2017-01-24 MED ORDER — DOCUSATE SODIUM 100 MG PO CAPS
100.0000 mg | ORAL_CAPSULE | Freq: Every day | ORAL | Status: DC
  Administered 2017-01-25 – 2017-01-26 (×2): 100 mg via ORAL
  Filled 2017-01-24 (×2): qty 1

## 2017-01-24 MED ORDER — ONDANSETRON HCL 4 MG/2ML IJ SOLN: 4.0000 mg | Freq: Once | INTRAMUSCULAR | Status: DC | PRN

## 2017-01-24 MED ORDER — HEPARIN SODIUM (PORCINE) 1000 UNIT/ML IJ SOLN
INTRAMUSCULAR | Status: DC | PRN
  Administered 2017-01-24: 7000 [IU] via INTRAVENOUS

## 2017-01-24 MED ORDER — MIDAZOLAM HCL 2 MG/2ML IJ SOLN
INTRAMUSCULAR | Status: AC
  Filled 2017-01-24: qty 2

## 2017-01-24 MED ORDER — PHENYLEPHRINE HCL 10 MG/ML IJ SOLN
INTRAVENOUS | Status: DC | PRN
  Administered 2017-01-24: 70 ug/min via INTRAVENOUS
  Administered 2017-01-24: 12:00:00 via INTRAVENOUS

## 2017-01-24 MED ORDER — MIDAZOLAM HCL 2 MG/2ML IJ SOLN
INTRAMUSCULAR | Status: DC | PRN
  Administered 2017-01-24: 2 mg via INTRAVENOUS

## 2017-01-24 SURGICAL SUPPLY — 55 items
BANDAGE ESMARK 6X9 LF (GAUZE/BANDAGES/DRESSINGS) ×1 IMPLANT
BNDG ESMARK 6X9 LF (GAUZE/BANDAGES/DRESSINGS) ×3
CANISTER SUCT 3000ML PPV (MISCELLANEOUS) ×3 IMPLANT
CANNULA VESSEL 3MM 2 BLNT TIP (CANNULA) ×6 IMPLANT
CLIP FOGARTY SPRING 6M (CLIP) IMPLANT
CLIP LIGATING EXTRA MED SLVR (CLIP) ×3 IMPLANT
CLIP LIGATING EXTRA SM BLUE (MISCELLANEOUS) ×3 IMPLANT
COVER PROBE W GEL 5X96 (DRAPES) ×3 IMPLANT
CUFF TOURNIQUET SINGLE 24IN (TOURNIQUET CUFF) ×3 IMPLANT
CUFF TOURNIQUET SINGLE 34IN LL (TOURNIQUET CUFF) IMPLANT
CUFF TOURNIQUET SINGLE 44IN (TOURNIQUET CUFF) IMPLANT
DERMABOND ADVANCED (GAUZE/BANDAGES/DRESSINGS) ×8
DERMABOND ADVANCED .7 DNX12 (GAUZE/BANDAGES/DRESSINGS) ×4 IMPLANT
DRAIN SNY 10X20 3/4 PERF (WOUND CARE) IMPLANT
DRAPE HALF SHEET 40X57 (DRAPES) IMPLANT
DRAPE X-RAY CASS 24X20 (DRAPES) IMPLANT
ELECT REM PT RETURN 9FT ADLT (ELECTROSURGICAL) ×3
ELECTRODE REM PT RTRN 9FT ADLT (ELECTROSURGICAL) ×1 IMPLANT
EVACUATOR SILICONE 100CC (DRAIN) IMPLANT
GAUZE SPONGE 4X4 12PLY STRL (GAUZE/BANDAGES/DRESSINGS) ×3 IMPLANT
GLOVE BIO SURGEON STRL SZ 6.5 (GLOVE) ×4 IMPLANT
GLOVE BIO SURGEON STRL SZ7 (GLOVE) ×6 IMPLANT
GLOVE BIO SURGEONS STRL SZ 6.5 (GLOVE) ×2
GLOVE BIOGEL PI IND STRL 7.0 (GLOVE) ×2 IMPLANT
GLOVE BIOGEL PI INDICATOR 7.0 (GLOVE) ×4
GLOVE SS BIOGEL STRL SZ 7.5 (GLOVE) ×1 IMPLANT
GLOVE SUPERSENSE BIOGEL SZ 7.5 (GLOVE) ×2
GOWN STRL REUS W/ TWL LRG LVL3 (GOWN DISPOSABLE) ×3 IMPLANT
GOWN STRL REUS W/TWL LRG LVL3 (GOWN DISPOSABLE) ×6
INSERT FOGARTY SM (MISCELLANEOUS) IMPLANT
KIT BASIN OR (CUSTOM PROCEDURE TRAY) ×3 IMPLANT
KIT ROOM TURNOVER OR (KITS) ×3 IMPLANT
NS IRRIG 1000ML POUR BTL (IV SOLUTION) ×6 IMPLANT
PACK PERIPHERAL VASCULAR (CUSTOM PROCEDURE TRAY) ×3 IMPLANT
PAD ARMBOARD 7.5X6 YLW CONV (MISCELLANEOUS) ×6 IMPLANT
PADDING CAST COTTON 6X4 STRL (CAST SUPPLIES) ×3 IMPLANT
SET COLLECT BLD 21X3/4 12 (NEEDLE) IMPLANT
STAPLER VISISTAT 35W (STAPLE) IMPLANT
STOPCOCK 4 WAY LG BORE MALE ST (IV SETS) IMPLANT
SUT ETHILON 3 0 PS 1 (SUTURE) IMPLANT
SUT PROLENE 5 0 C 1 24 (SUTURE) ×3 IMPLANT
SUT PROLENE 6 0 CC (SUTURE) ×12 IMPLANT
SUT SILK 2 0 SH (SUTURE) ×3 IMPLANT
SUT SILK 3 0 (SUTURE) ×2
SUT SILK 3-0 18XBRD TIE 12 (SUTURE) ×1 IMPLANT
SUT SILK 4 0 (SUTURE) ×4
SUT SILK 4-0 18XBRD TIE 12 (SUTURE) ×2 IMPLANT
SUT VIC AB 2-0 CTX 36 (SUTURE) ×6 IMPLANT
SUT VIC AB 3-0 SH 27 (SUTURE) ×10
SUT VIC AB 3-0 SH 27X BRD (SUTURE) ×5 IMPLANT
SUT VIC AB 4-0 PS2 27 (SUTURE) ×6 IMPLANT
TRAY FOLEY W/METER SILVER 16FR (SET/KITS/TRAYS/PACK) ×3 IMPLANT
TUBING EXTENTION W/L.L. (IV SETS) IMPLANT
UNDERPAD 30X30 (UNDERPADS AND DIAPERS) ×3 IMPLANT
WATER STERILE IRR 1000ML POUR (IV SOLUTION) ×3 IMPLANT

## 2017-01-24 NOTE — Progress Notes (Signed)
HD tx completed @ 0130 w/o problem, UF goal met (kept even per MD order), blood rinsed back, report called to Olegario Shearer, RN

## 2017-01-24 NOTE — Op Note (Signed)
OPERATIVE REPORT  DATE OF SURGERY: 01/24/2017  PATIENT: Willie Andrade, 66 y.o. male MRN: [id number]  DOB: [birth year]  PRE-OPERATIVE DIAGNOSIS: Critical limb ischemia left foot  POST-OPERATIVE DIAGNOSIS:  Same  PROCEDURE: Left femoral to peroneal in situ bypass with great saphenous vein  SURGEON:  Gretta Began, M.D.  PHYSICIAN ASSISTANT: Samantha Rhyne PA-C  ANESTHESIA:  Gen.  EBL: 150 ml  Total I/O In: 1000 [I.V.:1000] Out: 150 [Blood:150]  BLOOD ADMINISTERED: None  DRAINS: None  SPECIMEN: None  COUNTS CORRECT:  YES  PLAN OF CARE: PACU   PATIENT DISPOSITION:  PACU - hemodynamically stable  PROCEDURE DETAILS: The patient was taken to the operative placed supine position where the area of the left groin left leg prepped in sterile fashion. SonoSite ultrasound was used to visualize his saphenous vein in the calf. Incision was made over the saphenous vein in the distal calf and continued from the calf to the groin using multiple skin bridges. The vein was small to moderate size throughout its course but was patent throughout its course. The peroneal artery was exposed through the medial vein harvest incision. This was done by reflecting the soleus muscle posteriorly. The posterior tibial peroneal arteries were exposed. The peroneal artery was calcified but did have an area that was acceptable for anastomosis. The common femoral artery was exposed to the groin vein harvest incision. The common superficial femoral and profundus reverse arteries were encircled with Vesseloops. There was extensive calcification but at the origin of the superficial femoral artery was area that was soft enough for anastomosis. The saphenous vein was exposed at the saphenofemoral junction. The saphenous vein was closed with ligation of tributary branches. The saphenofemoral junction was occluded with a Cooley clamp and the saphenous vein was excised from the saphenofemoral junction. The saphenous  femoral junction was oversewn with a 5-0 Prolene suture. Patient was given 7000 units of intravenous heparin and after adequate circulation time the common superficial femoral and profundus femoris arteries were occluded with Vesseloops. The common femoral artery was opened with 11 blade some ulcerative Potts scissors. The saphenous vein hood of the saphenous femoral junction was sewn end-to-side to the artery with a running 6-0 Prolene suture. The anastomosis was tested and was then completed and there was good hemostasis. The vein valves were then lysed with the [facility] valvulotome. This was passed through side branches multiple different sites. The final portion of the saphenous vein valves were lysed with vision of the distal saphenous vein. The pneumatic tourniquet was then placed in the above-knee position. The leg was elevated and exsanguinated and pneumatic tourniquet was inflated to 250 mmHg. The peroneal artery was opened and there was adequate for anastomosis. A 1-1/2 dilator passed proximally and distally. The vein was cut to appropriate length and was spatulated and sewn end-to-side to the artery with a running 6-0 Prolene suture. Prior to completion of the anastomosis the one half dilator again passed proximally and distally and through the vein graft. The pneumatic tourniquet was deflated and the anastomosis was completed. Next using a Doppler tributary branches of the saphenous vein were identified and ligated. The patient had graft dependent Doppler flow in the peroneal artery. The patient was given 50 mg of protamine to reverse heparin. The wounds were closed with 2-0 Vicryl in the groin and 3-0 Vicryl the subcutaneous and subarticular tissue. The vein harvest and popliteal incision were closed with 3-0 Vicryl the subcutaneous and subcuticular tissue. Sterile dressing was applied the patient was transferred to  the recovery room in stable condition   [name], M.D., [facility] 01/24/2017 4:06  PM

## 2017-01-24 NOTE — Progress Notes (Signed)
PROGRESS NOTE    Willie Andrade  ZOX:[id number] DOB: [birth year] DOA: 01/16/2017 PCP: [name], MD   Chief Complaint  Patient presents with  . Leg Swelling    Brief Narrative:  HPI on 01/17/2017 by Dr. Midge Minium  Willie Andrade is a 66 y.o. male with history of ESRD on hemodialysis, nonischemic cardiomyopathy status post AICD, anemia who was recently admitted for presyncope presents to the ER because of worsening swelling and pain in the left foot. Patient has been having these symptoms for last 24 hours. Has been a subjective feeling of fever and chills. Denies any trauma or insect bite.   Interim history Admitted 01/16/17 with left foot gangrene and diminished pulses. Placed on vancomycin and zosyn. Vascular surgery consulted, s/p Arteriogram with bilateral lower extremity runoff, bypass graft femoral-peroneal left.  Assessment & Plan   Left foot cellulitis/dry gangrene -Patient placed on IV vancomycin and Zosyn -Orthopedic surgery consulted and appreciated, patient may need debridement or amputation after vascular intervention -Vascular surgery consulted and appreciated -S/p Arteriogram with bilateral lower extremity runoff on 01/22/17: Aortoiliac irregularity with no flow-limiting stenosis, patent bilateral superficial femoral artery stents, severe tibial disease bilaterally with complete occlusion of all tibial vessels and reconstruction of the peritoneal artery on the left. On the right, the patient had a disease peroneal that was in line with popliteal artery. 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 -Hemoglobin currently stable, 12.1 -Continue Sensipar, Rena-Vite, Renvela  Essential hypertension/hypotension -Not on any medication patient actually uses  Midodrine on dialysis days  Chronic systolic heart failure/nonischemic cardiomyopathy -Last Echocardiogram on 12/23/2016 showed EF 15-20% with diffuse hypokinesis -s/p biventricular ICD -Continue volume management with dialysis -Patient currently appears to be euvolemic  DVT Prophylaxis  Heparin  Code Status: Full  Family Communication: None at bedside  Disposition Plan: Admitted.Dispo TBD  Consultants Vascular surgery Orthopedic surgery Nephrology  Procedures  Arteriogram  Left femoral-peroneal bypass  Antibiotics   Anti-infectives    Start     Dose/Rate Route Frequency Ordered Stop   01/23/17 0915  [MAR Hold]  ceFAZolin (ANCEF) IVPB 2g/100 mL premix     (MAR Hold since 01/24/17 8119)  Comments:  Send with pt to OR   2 g 200 mL/hr over 30 Minutes Intravenous To ShortStay Surgical 01/23/17 0910 01/24/17 0806   01/18/17 1200  [MAR Hold]  vancomycin (VANCOCIN) IVPB 750 mg/150 ml premix     (MAR Hold since 01/24/17 0652)   750 mg 150 mL/hr over 60 Minutes Intravenous Every T-Th-Sa (Hemodialysis) 01/17/17 0128     01/17/17 1000  [MAR Hold]  piperacillin-tazobactam (ZOSYN) IVPB 3.375 g     (MAR Hold since 01/24/17 0652)   3.375 g 12.5 mL/hr over 240 Minutes Intravenous Every 12 hours 01/17/17 0128     01/17/17 0130  vancomycin (VANCOCIN) 1,250 mg in sodium chloride 0.9 % 250 mL IVPB     1,250 mg 166.7 mL/hr over 90 Minutes Intravenous  Once 01/17/17 0119 01/17/17 0351   01/17/17 0045  vancomycin (VANCOCIN) IVPB 1000 mg/200 mL premix  Status:  Discontinued     1,000 mg 200 mL/hr over 60 Minutes Intravenous  Once 01/17/17 0042 01/17/17 0118   01/17/17 0045  piperacillin-tazobactam (ZOSYN) IVPB 3.375 g     3.375 g 100 mL/hr over 30 Minutes Intravenous  Once 01/17/17 0042 01/17/17 0138  Subjective:   Willie Andrade seen and examined today in PACU. Currently sleeping heavily given recent sedation with anesthesia and pain medications.   Objective:   Vitals:    01/24/17 1500 01/24/17 1509 01/24/17 1510 01/24/17 1524  BP:  (!) 101/54  (!) 108/56  Pulse: 64 69 60 68  Resp: 18 (!) 22 15 16   Temp:      TempSrc:      SpO2: 96% 95% 97% 97%  Weight:      Height:        Intake/Output Summary (Last 24 hours) at 01/24/17 1547 Last data filed at 01/24/17 1207  Gross per 24 hour  Intake             1000 ml  Output              152 ml  Net              848 ml   Filed Weights   01/23/17 0404 01/23/17 2135 01/24/17 0157  Weight: 84.5 kg (186 lb 3.2 oz) 61.9 kg (136 lb 7.4 oz) 61.9 kg (136 lb 7.4 oz)   Exam  General: Well developed, well nourished, NAD, appears stated age  HEENT: NCAT,mucous membranes moist.   Cardiovascular: S1 S2 auscultated, 2/6SEM, RRR  Respiratory: Clear to auscultation bilaterally with equal chest rise  Abdomen: Soft, nontender, nondistended, + bowel sounds  Extremities: warm dry without cyanosis clubbing or edema. Left foot mild edema, dry gangrene of left toes. LUE AVF +B/T  Neuro: unable to fully assess given current state  Psych: unable to assess  Data Reviewed: I have personally reviewed following labs and imaging studies  CBC:  Recent Labs Lab 01/19/17 0431 01/20/17 0746 01/22/17 0619 01/23/17 0316 01/24/17 0534  WBC 7.3 6.9 6.5 8.2 8.6  HGB 12.0* 12.4* 13.0 12.3* 12.1*  HCT 40.4 42.3 43.3 40.8 40.1  MCV 90.6 90.4 92.3 89.5 90.9  PLT 179 206 130* 179 144*   Basic Metabolic Panel:  Recent Labs Lab 01/18/17 1633  01/20/17 0746 01/20/17 1818 01/22/17 0619 01/23/17 0316 01/24/17 0534  NA 139  < > 135 133* 136 136 136  K 4.3  < > 4.7 3.5 5.3* 5.0 4.5  CL 96*  < > 93* 92* 96* 95* 96*  CO2 30  < > 25 28 19* 25 23  GLUCOSE 103*  < > 83 107* 70 104* 62*  BUN 34*  < > 24* 11 23* 26* 12  CREATININE 7.76*  < > 6.76* 4.45* 6.83* 8.22* 5.23*  CALCIUM 7.5*  < > 7.6* 7.6* 7.5* 7.5* 7.4*  PHOS 3.6  --  3.4 2.8  --   --   --   < > = values in this interval not displayed. GFR: Estimated Creatinine  Clearance: 12.2 mL/min (A) (by C-G formula based on SCr of 5.23 mg/dL (H)). Liver Function Tests:  Recent Labs Lab 01/18/17 1633 01/20/17 0746 01/20/17 1818  ALBUMIN 2.9* 3.0* 2.8*   No results for input(s): LIPASE, AMYLASE in the last 168 hours. No results for input(s): AMMONIA in the last 168 hours. Coagulation Profile:  Recent Labs Lab 01/19/17 0431 01/22/17 0619  INR 1.32 1.33   Cardiac Enzymes: No results for input(s): CKTOTAL, CKMB, CKMBINDEX, TROPONINI in the last 168 hours. BNP (last 3 results) No results for input(s): PROBNP in the last 8760 hours. HbA1C: No results for input(s): HGBA1C in the last 72 hours. CBG: No results for input(s): GLUCAP in the last 168 hours. Lipid  Profile: No results for input(s): CHOL, HDL, LDLCALC, TRIG, CHOLHDL, LDLDIRECT in the last 72 hours. Thyroid Function Tests: No results for input(s): TSH, T4TOTAL, FREET4, T3FREE, THYROIDAB in the last 72 hours. Anemia Panel: No results for input(s): VITAMINB12, FOLATE, FERRITIN, TIBC, IRON, RETICCTPCT in the last 72 hours. Urine analysis: No results found for: COLORURINE, APPEARANCEUR, LABSPEC, PHURINE, GLUCOSEU, HGBUR, BILIRUBINUR, KETONESUR, PROTEINUR, UROBILINOGEN, NITRITE, LEUKOCYTESUR Sepsis Labs: @LABRCNTIP (procalcitonin:4,lacticidven:4)  ) Recent Results (from the past 240 hour(s))  MRSA PCR Screening     Status: None   Collection Time: 01/18/17  4:50 AM  Result Value Ref Range Status   MRSA by PCR NEGATIVE NEGATIVE Final    Comment:        The GeneXpert MRSA Assay (FDA approved for NASAL specimens only), is one component of a comprehensive MRSA colonization surveillance program. It is not intended to diagnose MRSA infection nor to guide or monitor treatment for MRSA infections.   Surgical PCR screen     Status: None   Collection Time: 01/19/17  5:27 AM  Result Value Ref Range Status   MRSA, PCR NEGATIVE NEGATIVE Final   Staphylococcus aureus NEGATIVE NEGATIVE Final     Comment:        The Xpert SA Assay (FDA approved for NASAL specimens in patients over 30 years of age), is one component of a comprehensive surveillance program.  Test performance has been validated by [facility] for patients greater than or equal to 42 year old. It is not intended to diagnose infection nor to guide or monitor treatment.       Radiology Studies: No results found.   Scheduled Meds: . [MAR Hold] calcitRIOL  1 mcg Oral Q T,Th,Sa-HD  . [MAR Hold] cinacalcet  180 mg Oral Q T,Th,Sat-1800  . [MAR Hold] feeding supplement (NEPRO CARB STEADY)  237 mL Oral BID BM  . [MAR Hold] gabapentin  100 mg Oral BID  . [MAR Hold] heparin subcutaneous  5,000 Units Subcutaneous Q8H  . HYDROmorphone      . [MAR Hold] midodrine  5 mg Oral BID WC  . [MAR Hold] multivitamin  1 tablet Oral QHS  . [MAR Hold] sevelamer carbonate  3,200 mg Oral TID WC   Continuous Infusions: . [MAR Hold] sodium chloride    . [MAR Hold] sodium chloride    . [MAR Hold] sodium chloride    . [MAR Hold] piperacillin-tazobactam (ZOSYN)  IV Stopped (01/24/17 1610)  . [MAR Hold] vancomycin 750 mg (01/24/17 0030)     LOS: 7 days   Time Spent in minutes   30 minutes  Rikki Trosper D.O. on 01/24/2017 at 3:47 PM  Between 7am to 7pm - Pager - [phone]  After 7pm go to [email or web address] - password [email or web address]  And look for the night coverage person covering for me after hours  [facility] Office  [phone]

## 2017-01-24 NOTE — Transfer of Care (Signed)
Immediate Anesthesia Transfer of Care Note  Patient: Willie Andrade  Procedure(s) Performed: Procedure(s): Left Leg  FEMORAL-PERONEAL Bypass Graft (Left)  Patient Location: PACU  Anesthesia Type:General  Level of Consciousness: sedated and patient cooperative  Airway & Oxygen Therapy: Patient Spontanous Breathing and Patient connected to nasal cannula oxygen  Post-op Assessment: Report given to RN, Post -op Vital signs reviewed and stable and Patient moving all extremities  Post vital signs: Reviewed and stable  Last Vitals:  Vitals:   01/24/17 0157 01/24/17 0550  BP: 117/76 (!) 104/58  Pulse: 88 70  Resp: 11 16  Temp: 36.4 C 36.8 C    Last Pain:  Vitals:   01/24/17 0701  TempSrc:   PainSc: 3       Patients Stated Pain Goal: 0 (01/22/17 1050)  Complications: No apparent anesthesia complications

## 2017-01-24 NOTE — Progress Notes (Signed)
Pt continues to try to get OOB, fighting , throwing legs over side rail. DR Early in to see pt, no new orders

## 2017-01-24 NOTE — H&P (View-Only) (Signed)
Patient ID: Willie Andrade, male   DOB: May 10, 1951, 67 y.o.   MRN: [id number] Comfortable this morning. Right groin access site without hematoma. No change in dry gangrenous changes of tips of left toes. Again discussed plan for left femoral to peroneal bypass with saphenous vein tomorrow. Patient understands procedure and is ready to proceed. Will have hemodialysis today.

## 2017-01-24 NOTE — OR Nursing (Signed)
Patient had a packet of belongings around his neck when he arrived in [place]. Belongings listed and place in patient belongings package. It was given to Vista Mink RN AD to deliver the belongings to security to be placed in safe.

## 2017-01-24 NOTE — Interval H&P Note (Signed)
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 After consideration of risks, benefits and other options for treatment, the patient has consented to  Procedure(s): BYPASS GRAFT FEMORAL-PERONEAL (Left) as a surgical intervention .  The patient's history has been reviewed, patient examined, no change in status, stable for surgery.  I have reviewed the patient's chart and labs.  Questions were answered to the patient's satisfaction.     Gretta Began

## 2017-01-24 NOTE — Anesthesia Procedure Notes (Signed)
Arterial Line Insertion Start/End6/27/2018 7:25 AM, 01/24/2017 7:30 AM Performed by: Arta Bruce, anesthesiologist  Preanesthetic checklist: patient identified, IV checked, risks and benefits discussed, surgical consent, monitors and equipment checked, pre-op evaluation, timeout performed and anesthesia consent Lidocaine 1% used for infiltration Right, brachial was placed Catheter size: 20 G  Attempts: 1 Procedure performed without using ultrasound guided technique. Following insertion, line sutured, dressing applied and Biopatch. Patient tolerated the procedure well with no immediate complications.

## 2017-01-24 NOTE — Progress Notes (Signed)
  Day of Surgery Note    Subjective:  Trying to get out of bed   Vitals:   01/24/17 1524 01/24/17 1555  BP: (!) 108/56 (!) 123/94  Pulse: 68 73  Resp: 16 14  Temp:      Incisions:   All are clean and dry without hematoma Extremities:  + left peroneal and DP doppler signals Cardiac:  regular Lungs:  Non labored   Assessment/Plan:  This is a 66 y.o. male who is s/p left femoral to peroneal bypass grafting  -pt with + left peroneal and DP doppler signals -wanting to get out of bed-may need soft restraints -to 4 east when bed available   Doreatha Massed, PA-C 01/24/2017 4:09 PM [phone]

## 2017-01-24 NOTE — Anesthesia Preprocedure Evaluation (Signed)
Anesthesia Evaluation  Patient identified by MRN, date of birth, ID band Patient awake    Reviewed: Allergy & Precautions, NPO status , Patient's Chart, lab work & pertinent test results  Airway Mallampati: I  TM Distance: >3 FB Neck ROM: Full    Dental   Pulmonary Current Smoker,    Pulmonary exam normal        Cardiovascular hypertension, + Peripheral Vascular Disease and +CHF  Normal cardiovascular exam+ Cardiac Defibrillator   EF 10-15% - Art line   Neuro/Psych    GI/Hepatic (+) Hepatitis -, B  Endo/Other    Renal/GU Dialysis and ESRFRenal disease     Musculoskeletal   Abdominal   Peds  Hematology   Anesthesia Other Findings   Reproductive/Obstetrics                             Anesthesia Physical Anesthesia Plan  ASA: III  Anesthesia Plan: General   Post-op Pain Management:    Induction: Intravenous  PONV Risk Score and Plan: 1 and Ondansetron and Dexamethasone  Airway Management Planned:   Additional Equipment: Arterial line  Intra-op Plan:   Post-operative Plan: Extubation in OR  Informed Consent: I have reviewed the patients History and Physical, chart, labs and discussed the procedure including the risks, benefits and alternatives for the proposed anesthesia with the patient or authorized representative who has indicated his/her understanding and acceptance.     Plan Discussed with: CRNA and Surgeon  Anesthesia Plan Comments:         Anesthesia Quick Evaluation

## 2017-01-24 NOTE — Progress Notes (Signed)
[facility] Progress Note   Subjective: Seen in PACU. Sedated, agitated when aroused. Grunting, does open eyes to verbal stimuli, not following commands.   Objective Vitals:   01/24/17 1500 01/24/17 1509 01/24/17 1510 01/24/17 1524  BP:  (!) 101/54  (!) 108/56  Pulse: 64 69 60 68  Resp: 18 (!) 22 15 16   Temp:      TempSrc:      SpO2: 96% 95% 97% 97%  Weight:      Height:       Physical Exam General: Chronically ill appearing male, agitated at present.  Heart: S1,S2, 2/6 systolic M. SR on monitor Lungs: CTAB, no WOB, respirations shallow.  Abdomen: soft, hypoactive BS Extremities: No LE edema. Incision with derma-bond L inner leg. Dry gangrene L. 2nd, 3rd, 4th toes. Feet cold, no palpable pulses.  Dialysis Access: LFA AVF + bruit   Additional Objective Labs: Basic Metabolic Panel:  Recent Labs Lab 01/18/17 1633  01/20/17 0746 01/20/17 1818 01/22/17 0619 01/23/17 0316 01/24/17 0534  NA 139  < > 135 133* 136 136 136  K 4.3  < > 4.7 3.5 5.3* 5.0 4.5  CL 96*  < > 93* 92* 96* 95* 96*  CO2 30  < > 25 28 19* 25 23  GLUCOSE 103*  < > 83 107* 70 104* 62*  BUN 34*  < > 24* 11 23* 26* 12  CREATININE 7.76*  < > 6.76* 4.45* 6.83* 8.22* 5.23*  CALCIUM 7.5*  < > 7.6* 7.6* 7.5* 7.5* 7.4*  PHOS 3.6  --  3.4 2.8  --   --   --   < > = values in this interval not displayed. Liver Function Tests:  Recent Labs Lab 01/18/17 1633 01/20/17 0746 01/20/17 1818  ALBUMIN 2.9* 3.0* 2.8*   No results for input(s): LIPASE, AMYLASE in the last 168 hours. CBC:  Recent Labs Lab 01/19/17 0431 01/20/17 0746 01/22/17 0619 01/23/17 0316 01/24/17 0534  WBC 7.3 6.9 6.5 8.2 8.6  HGB 12.0* 12.4* 13.0 12.3* 12.1*  HCT 40.4 42.3 43.3 40.8 40.1  MCV 90.6 90.4 92.3 89.5 90.9  PLT 179 206 130* 179 144*   Blood Culture    Component Value Date/Time   SDES BLOOD RIGHT ANTECUBITAL 12/21/2016 2041   SPECREQUEST  12/21/2016 2041    BOTTLES DRAWN AEROBIC AND ANAEROBIC Blood  Culture adequate volume   CULT NO GROWTH 5 DAYS 12/21/2016 2041   REPTSTATUS 12/26/2016 FINAL 12/21/2016 2041    Cardiac Enzymes: No results for input(s): CKTOTAL, CKMB, CKMBINDEX, TROPONINI in the last 168 hours. CBG: No results for input(s): GLUCAP in the last 168 hours. Iron Studies: No results for input(s): IRON, TIBC, TRANSFERRIN, FERRITIN in the last 72 hours. @lablastinr3 @ Studies/Results: No results found. Medications: . [MAR Hold] sodium chloride    . [MAR Hold] sodium chloride    . [MAR Hold] sodium chloride    . [MAR Hold] piperacillin-tazobactam (ZOSYN)  IV Stopped (01/24/17 2542)  . [MAR Hold] vancomycin 750 mg (01/24/17 0030)   . [MAR Hold] calcitRIOL  1 mcg Oral Q T,Th,Sa-HD  . [MAR Hold] cinacalcet  180 mg Oral Q T,Th,Sat-1800  . [MAR Hold] feeding supplement (NEPRO CARB STEADY)  237 mL Oral BID BM  . [MAR Hold] gabapentin  100 mg Oral BID  . [MAR Hold] heparin subcutaneous  5,000 Units Subcutaneous Q8H  . HYDROmorphone      . [MAR Hold] midodrine  5 mg Oral BID WC  . [MAR Hold] multivitamin  1 tablet Oral QHS  . [MAR Hold] sevelamer carbonate  3,200 mg Oral TID WC   D[facility]: [facility]  68kg  2/2 bath  400/800  Hep 1400   LFA AVF -Sensipar 180 mg PO TIW (last PTH 1527 12/26/16) -Calcitriol 1 mcg PO TIW -Venofer 50 mg IV weekly (last dose 01/11/17 Last Fe 40 Tsat 22% 12/26/16) -Mircera 75 mcg IV q w 2 weeks (Last HGB 10.7 01/11/17)     Assess/Plan: 1. L 2nd/3rd toe wound/dry gangrene: S/P left femoral to peroneal bypass per [facility] today.  2. ESRD: Continue TTS schedule, next HD 01/24/17 No heparin. .  3.BP/volume: Usually hypotensive, on midodrine 5mg  BID. No edema. Last HD 01/23/17 Pre wt 61.9 Net UF 852 Post Wt 61.9 kg. Is now 6.1 kg under EDW. No evidence of volume overload but concerned about wt loss. Attempt 0.5-1 liter as tolerated tomorrow.  4. Anemia: Hgb 13 No ESA or iron for now. 5. Secondary hyperparathyroidism:Add renal function panel to  labs today. Cont binders, sensipar VDRA. Ca 7.4 C Ca 8.36 6. Nutrition: NPO at present. No recent Albumin . Check renal function today.  7. Systolic heart failure (LVEF 15-20%) with ICD: per primary.   Rita H. Brown NP-C 01/24/2017, 3:26 PM  [facility] [phone]  Pt seen, examined and agree w A/P as above.  Vinson Moselle MD [employer] pager [phone]   01/25/2017, 8:22 AM

## 2017-01-24 NOTE — Anesthesia Postprocedure Evaluation (Signed)
Anesthesia Post Note  Patient: Mehki Cefaratti  Procedure(s) Performed: Procedure(s) (LRB): Left Leg  FEMORAL-PERONEAL Bypass Graft (Left)     Patient location during evaluation: PACU Anesthesia Type: General Level of consciousness: awake and alert Pain management: pain level controlled Vital Signs Assessment: post-procedure vital signs reviewed and stable Respiratory status: spontaneous breathing, nonlabored ventilation, respiratory function stable and patient connected to nasal cannula oxygen Cardiovascular status: blood pressure returned to baseline and stable Postop Assessment: no signs of nausea or vomiting Anesthetic complications: no    Last Vitals:  Vitals:   01/24/17 1648 01/24/17 1700  BP:    Pulse:  80  Resp:  19  Temp: 36.3 C     Last Pain:  Vitals:   01/24/17 1555  TempSrc:   PainSc: 7                  Naydeline Morace DAVID

## 2017-01-24 NOTE — Anesthesia Procedure Notes (Signed)
Procedure Name: Intubation Date/Time: 01/24/2017 8:07 AM Performed by: Trixie Deis A Pre-anesthesia Checklist: Patient identified, Emergency Drugs available, Suction available and Patient being monitored Patient Re-evaluated:Patient Re-evaluated prior to inductionOxygen Delivery Method: Circle System Utilized Preoxygenation: Pre-oxygenation with 100% oxygen Intubation Type: IV induction Ventilation: Mask ventilation without difficulty Laryngoscope Size: Mac and 4 Grade View: Grade I Tube type: Oral Tube size: 7.5 mm Number of attempts: 1 Airway Equipment and Method: Stylet and Oral airway Placement Confirmation: ETT inserted through vocal cords under direct vision,  positive ETCO2 and breath sounds checked- equal and bilateral Secured at: 23 cm Tube secured with: Tape Dental Injury: Teeth and Oropharynx as per pre-operative assessment

## 2017-01-25 ENCOUNTER — Encounter (HOSPITAL_COMMUNITY): Payer: Self-pay | Admitting: Vascular Surgery

## 2017-01-25 ENCOUNTER — Encounter (HOSPITAL_COMMUNITY): Payer: Medicare Other

## 2017-01-25 DIAGNOSIS — I959 Hypotension, unspecified: Secondary | ICD-10-CM

## 2017-01-25 DIAGNOSIS — D72829 Elevated white blood cell count, unspecified: Secondary | ICD-10-CM

## 2017-01-25 DIAGNOSIS — D638 Anemia in other chronic diseases classified elsewhere: Secondary | ICD-10-CM

## 2017-01-25 DIAGNOSIS — R52 Pain, unspecified: Secondary | ICD-10-CM

## 2017-01-25 DIAGNOSIS — D62 Acute posthemorrhagic anemia: Secondary | ICD-10-CM

## 2017-01-25 DIAGNOSIS — Z95828 Presence of other vascular implants and grafts: Secondary | ICD-10-CM

## 2017-01-25 LAB — BASIC METABOLIC PANEL
ANION GAP: 15 (ref 5–15)
BUN: 20 mg/dL (ref 6–20)
CALCIUM: 7.2 mg/dL — AB (ref 8.9–10.3)
CO2: 25 mmol/L (ref 22–32)
Chloride: 97 mmol/L — ABNORMAL LOW (ref 101–111)
Creatinine, Ser: 6.37 mg/dL — ABNORMAL HIGH (ref 0.61–1.24)
GFR calc Af Amer: 9 mL/min — ABNORMAL LOW (ref >60)
GFR calc non Af Amer: 8 mL/min — ABNORMAL LOW (ref >60)
GLUCOSE: 151 mg/dL — AB (ref 65–99)
POTASSIUM: 4.4 mmol/L (ref 3.5–5.1)
Sodium: 137 mmol/L (ref 135–145)

## 2017-01-25 LAB — CBC
HEMATOCRIT: 36.6 % — AB (ref 39.0–52.0)
Hemoglobin: 10.7 g/dL — ABNORMAL LOW (ref 13.0–17.0)
MCH: 26.6 pg (ref 26.0–34.0)
MCHC: 29.2 g/dL — ABNORMAL LOW (ref 30.0–36.0)
MCV: 91 fL (ref 78.0–100.0)
PLATELETS: 171 10*3/uL (ref 150–400)
RBC: 4.02 MIL/uL — ABNORMAL LOW (ref 4.22–5.81)
RDW: 18 % — AB (ref 11.5–15.5)
WBC: 13 10*3/uL — AB (ref 4.0–10.5)

## 2017-01-25 MED ORDER — OXYCODONE-ACETAMINOPHEN 5-325 MG PO TABS
ORAL_TABLET | ORAL | Status: AC
  Filled 2017-01-25: qty 2

## 2017-01-25 MED ORDER — MIDODRINE HCL 5 MG PO TABS
ORAL_TABLET | ORAL | Status: AC
  Filled 2017-01-25: qty 1

## 2017-01-25 MED ORDER — VANCOMYCIN HCL IN DEXTROSE 750-5 MG/150ML-% IV SOLN
INTRAVENOUS | Status: AC
  Filled 2017-01-25: qty 150

## 2017-01-25 MED ORDER — HYDROMORPHONE HCL 1 MG/ML IJ SOLN: 1.0000 mg | Freq: Once | INTRAMUSCULAR | Status: DC

## 2017-01-25 NOTE — Evaluation (Signed)
Physical Therapy Evaluation Patient Details Name: Willie Andrade MRN: [id number] DOB: 18-Nov-1950 Today's Date: 01/25/2017   History of Present Illness  Willie Andrade is a 66 y.o. male with history of ESRD on hemodialysis, nonischemic cardiomyopathy status post AICD, anemia who was recently admitted for presyncope presents to the ER because of worsening swelling and pain in the left foot. Patient has been having these symptoms for last 24 hours. Admitted 01/16/17 with left foot gangrene and diminished pulses. Placed on vancomycin and zosyn. Vascular surgery consulted, s/p Arteriogram with bilateral lower extremity runoff, bypass graft femoral-peroneal left. PMH:  ESRD, CHF, Hypotension  Clinical Impression  Pt admitted with above diagnosis. Pt currently with functional limitations due to the deficits listed below (see PT Problem List). Pt was able to ambulate in halls with overall good balance with some decreased safety awareness but didn't affect pts balance a whole lot.  Pt did not need device and was fairly steady.  Pt self corrects when he does misstep due to left LE pain.  Will  Follow acutely.  Pt will benefit from skilled PT to increase their independence and safety with mobility to allow discharge to the venue listed below.      Follow Up Recommendations No PT follow up;Supervision - Intermittent    Equipment Recommendations  None recommended by PT    Recommendations for Other Services       Precautions / Restrictions Precautions Precautions: Fall Restrictions Weight Bearing Restrictions: No      Mobility  Bed Mobility Overal bed mobility: Independent                Transfers Overall transfer level: Independent                  Ambulation/Gait Ambulation/Gait assistance: Min guard Ambulation Distance (Feet): 250 Feet Assistive device: None Gait Pattern/deviations: Decreased step length - left;Decreased weight shift to left;Decreased stance time -  left;Decreased stride length;Antalgic   Gait velocity interpretation: Below normal speed for age/gender General Gait Details: Pt overall steady occasionally stepping left and right. No significant LOB with pt self correcting. Pt impulsive at times and wants to do his own thing.  He wanted to hold the heart monitor and did not want therapist to hold onto him.  Some decreased safety awareness but again no LOB.    Stairs            Wheelchair Mobility    Modified Rankin (Stroke Patients Only)       Balance Overall balance assessment: Needs assistance Sitting-balance support: No upper extremity supported;Feet supported Sitting balance-Leahy Scale: Good     Standing balance support: During functional activity;No upper extremity supported Standing balance-Leahy Scale: Fair Standing balance comment: Pt was able to stand statically without UE support without LOB.               High level balance activites: Direction changes;Turns High Level Balance Comments: min guard assist             Pertinent Vitals/Pain Pain Assessment: Faces Faces Pain Scale: Hurts a little bit Pain Location: left LE Pain Descriptors / Indicators: Aching;Grimacing;Guarding;Sore Pain Intervention(s): Limited activity within patient's tolerance;Monitored during session;Repositioned;Patient requesting pain meds-RN notified    Home Living Family/patient expects to be discharged to:: Private residence Living Arrangements: Other relatives Available Help at Discharge: Family;Available PRN/intermittently (sister, young grandchildren) Type of Home: House Home Access: Stairs to enter Entrance Stairs-Rails: Right;Left;Can reach both Entrance Stairs-Number of Steps: 3 Home Layout: One level Home Equipment:  None      Prior Function Level of Independence: Independent               Hand Dominance        Extremity/Trunk Assessment   Upper Extremity Assessment Upper Extremity Assessment:  Defer to OT evaluation    Lower Extremity Assessment Lower Extremity Assessment: Generalized weakness    Cervical / Trunk Assessment Cervical / Trunk Assessment: Normal  Communication   Communication: No difficulties  Cognition Arousal/Alertness: Awake/alert Behavior During Therapy: WFL for tasks assessed/performed Overall Cognitive Status: Within Functional Limits for tasks assessed                                        General Comments      Exercises     Assessment/Plan    PT Assessment Patient needs continued PT services  PT Problem List Decreased activity tolerance;Decreased mobility;Decreased balance;Decreased knowledge of use of DME;Decreased safety awareness;Decreased knowledge of precautions;Pain       PT Treatment Interventions DME instruction;Gait training;Stair training;Functional mobility training;Therapeutic activities;Therapeutic exercise;Patient/family education    PT Goals (Current goals can be found in the Care Plan section)  Acute Rehab PT Goals Patient Stated Goal: to go home PT Goal Formulation: With patient Time For Goal Achievement: 02/01/17 Potential to Achieve Goals: Good    Frequency Min 3X/week   Barriers to discharge        Co-evaluation               AM-PAC PT "6 Clicks" Daily Activity  Outcome Measure Difficulty turning over in bed (including adjusting bedclothes, sheets and blankets)?: None Difficulty moving from lying on back to sitting on the side of the bed? : None Difficulty sitting down on and standing up from a chair with arms (e.g., wheelchair, bedside commode, etc,.)?: None Help needed moving to and from a bed to chair (including a wheelchair)?: None Help needed walking in hospital room?: A Little Help needed climbing 3-5 steps with a railing? : A Little 6 Click Score: 22    End of Session Equipment Utilized During Treatment: Gait belt Activity Tolerance: Patient tolerated treatment well Patient  left: in chair;with call bell/phone within reach Nurse Communication: Mobility status;Patient requests pain meds PT Visit Diagnosis: Unsteadiness on feet (R26.81);Muscle weakness (generalized) (M62.81);Pain Pain - Right/Left: Left Pain - part of body: Leg    Time: 1610-9604 PT Time Calculation (min) (ACUTE ONLY): 13 min   Charges:   PT Evaluation $PT Eval Moderate Complexity: 1 Procedure     PT G Codes:        Matin Mattioli,PT Acute Rehabilitation [phone] (pager)   Berline Lopes 01/25/2017, 4:44 PM

## 2017-01-25 NOTE — Progress Notes (Addendum)
PROGRESS NOTE    Willie Andrade  ZOX:[id number] DOB: Feb 22, 1951 DOA: 01/16/2017 PCP: [name], MD   Chief Complaint  Patient presents with  . Leg Swelling    Brief Narrative:  HPI on 01/17/2017 by Dr. Midge Minium  Willie Andrade is a 66 y.o. male with history of ESRD on hemodialysis, nonischemic cardiomyopathy status post AICD, anemia who was recently admitted for presyncope presents to the ER because of worsening swelling and pain in the left foot. Patient has been having these symptoms for last 24 hours. Has been a subjective feeling of fever and chills. Denies any trauma or insect bite.   Interim history Admitted 01/16/17 with left foot gangrene and diminished pulses. Placed on vancomycin and zosyn. Vascular surgery consulted, s/p Arteriogram with bilateral lower extremity runoff, bypass graft femoral-peroneal left.  Assessment & Plan   Left foot cellulitis/dry gangrene -Patient placed on IV vancomycin and Zosyn -Orthopedic surgery consulted and appreciated, patient may need debridement or amputation after vascular intervention -Vascular surgery consulted and appreciated -S/p Arteriogram with bilateral lower extremity runoff on 01/22/17: Aortoiliac irregularity with no flow-limiting stenosis, patent bilateral superficial femoral artery stents, severe tibial disease bilaterally with complete occlusion of all tibial vessels and reconstruction of the peritoneal artery on the left. On the right, the patient had a disease peroneal that was in line with popliteal artery. -S/p left femoral to peroneal bypass with saphenous vein 01/24/2017 -Continue pain management as needed-cautiously given patient's soft BP. Percocet was also added in addition to IV morphine.   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/ Acute blood loss anemia -Hemoglobin 10.7,  drop in hemoglobin from 6/27 likely secondary to surgery -Upon review of Epic, patient's hemoglobin has ranged from 10-12 -Continue Sensipar, Rena-Vite, Renvela -Continue to monitor  Essential hypertension/hypotension -Not on any medication patient actually uses Midodrine on dialysis days  Chronic systolic heart failure/nonischemic cardiomyopathy -Last Echocardiogram on 12/23/2016 showed EF 15-20% with diffuse hypokinesis -s/p biventricular ICD -Continue volume management with dialysis -Patient currently appears to be euvolemic  Leukocytosis -Suspect reactive to recent surgery -Continue to monitor CBC  Hypotension -Continue midodrine -Monitor closely given use of IV and PO pain medications along with HD.   DVT Prophylaxis  Heparin  Code Status: Full  Family Communication: None at bedside  Disposition Plan: Admitted. Disposition TBD  Consultants Vascular surgery Orthopedic surgery Nephrology  Procedures  Arteriogram  Left femoral-peroneal bypass  Antibiotics   Anti-infectives    Start     Dose/Rate Route Frequency Ordered Stop   01/23/17 0915  ceFAZolin (ANCEF) IVPB 2g/100 mL premix    Comments:  Send with pt to OR   2 g 200 mL/hr over 30 Minutes Intravenous To ShortStay Surgical 01/23/17 0910 01/24/17 0806   01/18/17 1200  vancomycin (VANCOCIN) IVPB 750 mg/150 ml premix     750 mg 150 mL/hr over 60 Minutes Intravenous Every T-Th-Sa (Hemodialysis) 01/17/17 0128     01/17/17 1000  piperacillin-tazobactam (ZOSYN) IVPB 3.375 g     3.375 g 12.5 mL/hr over 240 Minutes Intravenous Every 12 hours 01/17/17 0128     01/17/17 0130  vancomycin (VANCOCIN) 1,250 mg in sodium chloride 0.9 % 250 mL IVPB     1,250 mg 166.7 mL/hr over 90 Minutes Intravenous  Once 01/17/17 0119 01/17/17 0351   01/17/17 0045  vancomycin (VANCOCIN) IVPB 1000 mg/200 mL premix  Status:  Discontinued     1,000 mg 200  mL/hr over 60 Minutes Intravenous  Once 01/17/17 0042 01/17/17 0118   01/17/17 0045   piperacillin-tazobactam (ZOSYN) IVPB 3.375 g     3.375 g 100 mL/hr over 30 Minutes Intravenous  Once 01/17/17 0042 01/17/17 0138      Subjective:   Willie Andrade seen and examined today in dialysis. Upset that his "bypass graft is outside of his leg." Complains of pain and feeling hungry. Denies chest pain, shortness of breath, abdominal pain, nausea, or vomiting.   Objective:   Vitals:   01/25/17 0900 01/25/17 0910 01/25/17 0915 01/25/17 1000  BP: (!) 78/45 (!) 78/45 (!) 109/54 (!) 97/49  Pulse: 62 (!) 59 71 (!) 58  Resp:      Temp:      TempSrc:      SpO2:      Weight:      Height:        Intake/Output Summary (Last 24 hours) at 01/25/17 1027 Last data filed at 01/25/17 0400  Gross per 24 hour  Intake             1530 ml  Output              150 ml  Net             1380 ml   Filed Weights   01/23/17 2135 01/24/17 0157 01/25/17 0449  Weight: 61.9 kg (136 lb 7.4 oz) 61.9 kg (136 lb 7.4 oz) 69.3 kg (152 lb 12.5 oz)   Exam  General: Well developed, chronically ill appearing, no distress  HEENT: NCAT, mucous membranes moist.   Cardiovascular: Normal S1/S2, RRR, 2/6 SEM  Respiratory: Clear to auscultation bilaterally with equal chest rise, no wheezing.   Abdomen: Soft, nontender, nondistended, + bowel sounds  Extremities: warm dry without cyanosis clubbing or edema. Mild left foot edema with gangrene of left toes, graft with pulse. Surgical incisions clean. LUE AVF  +B/T  Neuro: AAOx3, nonfocal  Psych:Difficult but pleasant  Data Reviewed: I have personally reviewed following labs and imaging studies  CBC:  Recent Labs Lab 01/20/17 0746 01/22/17 0619 01/23/17 0316 01/24/17 0534 01/25/17 0347  WBC 6.9 6.5 8.2 8.6 13.0*  HGB 12.4* 13.0 12.3* 12.1* 10.7*  HCT 42.3 43.3 40.8 40.1 36.6*  MCV 90.4 92.3 89.5 90.9 91.0  PLT 206 130* 179 144* 171   Basic Metabolic Panel:  Recent Labs Lab 01/18/17 1633  01/20/17 0746 01/20/17 1818 01/22/17 0619  01/23/17 0316 01/24/17 0534 01/25/17 0347  NA 139  < > 135 133* 136 136 136 137  K 4.3  < > 4.7 3.5 5.3* 5.0 4.5 4.4  CL 96*  < > 93* 92* 96* 95* 96* 97*  CO2 30  < > 25 28 19* 25 23 25   GLUCOSE 103*  < > 83 107* 70 104* 62* 151*  BUN 34*  < > 24* 11 23* 26* 12 20  CREATININE 7.76*  < > 6.76* 4.45* 6.83* 8.22* 5.23* 6.37*  CALCIUM 7.5*  < > 7.6* 7.6* 7.5* 7.5* 7.4* 7.2*  PHOS 3.6  --  3.4 2.8  --   --   --   --   < > = values in this interval not displayed. GFR: Estimated Creatinine Clearance: 11.2 mL/min (A) (by C-G formula based on SCr of 6.37 mg/dL (H)). Liver Function Tests:  Recent Labs Lab 01/18/17 1633 01/20/17 0746 01/20/17 1818  ALBUMIN 2.9* 3.0* 2.8*   No results for input(s): LIPASE, AMYLASE in the last 168  hours. No results for input(s): AMMONIA in the last 168 hours. Coagulation Profile:  Recent Labs Lab 01/19/17 0431 01/22/17 0619 01/24/17 1845  INR 1.32 1.33 1.28   Cardiac Enzymes: No results for input(s): CKTOTAL, CKMB, CKMBINDEX, TROPONINI in the last 168 hours. BNP (last 3 results) No results for input(s): PROBNP in the last 8760 hours. HbA1C: No results for input(s): HGBA1C in the last 72 hours. CBG: No results for input(s): GLUCAP in the last 168 hours. Lipid Profile: No results for input(s): CHOL, HDL, LDLCALC, TRIG, CHOLHDL, LDLDIRECT in the last 72 hours. Thyroid Function Tests: No results for input(s): TSH, T4TOTAL, FREET4, T3FREE, THYROIDAB in the last 72 hours. Anemia Panel: No results for input(s): VITAMINB12, FOLATE, FERRITIN, TIBC, IRON, RETICCTPCT in the last 72 hours. Urine analysis: No results found for: COLORURINE, APPEARANCEUR, LABSPEC, PHURINE, GLUCOSEU, HGBUR, BILIRUBINUR, KETONESUR, PROTEINUR, UROBILINOGEN, NITRITE, LEUKOCYTESUR Sepsis Labs: @LABRCNTIP (procalcitonin:4,lacticidven:4)  ) Recent Results (from the past 240 hour(s))  MRSA PCR Screening     Status: None   Collection Time: 01/18/17  4:50 AM  Result Value Ref  Range Status   MRSA by PCR NEGATIVE NEGATIVE Final    Comment:        The GeneXpert MRSA Assay (FDA approved for NASAL specimens only), is one component of a comprehensive MRSA colonization surveillance program. It is not intended to diagnose MRSA infection nor to guide or monitor treatment for MRSA infections.   Surgical PCR screen     Status: None   Collection Time: 01/19/17  5:27 AM  Result Value Ref Range Status   MRSA, PCR NEGATIVE NEGATIVE Final   Staphylococcus aureus NEGATIVE NEGATIVE Final    Comment:        The Xpert SA Assay (FDA approved for NASAL specimens in patients over 41 years of age), is one component of a comprehensive surveillance program.  Test performance has been validated by [facility] for patients greater than or equal to 87 year old. It is not intended to diagnose infection nor to guide or monitor treatment.       Radiology Studies: No results found.   Scheduled Meds: . calcitRIOL  1 mcg Oral Q T,Th,Sa-HD  . cinacalcet  180 mg Oral Q T,Th,Sat-1800  . docusate sodium  100 mg Oral Daily  . feeding supplement (NEPRO CARB STEADY)  237 mL Oral BID BM  . gabapentin  100 mg Oral BID  . heparin subcutaneous  5,000 Units Subcutaneous Q8H  .  HYDROmorphone (DILAUDID) injection  1 mg Intravenous Once in dialysis  . midodrine      . midodrine  5 mg Oral BID WC  . multivitamin  1 tablet Oral QHS  . sevelamer carbonate  3,200 mg Oral TID WC   Continuous Infusions: . sodium chloride    . sodium chloride    . sodium chloride    . piperacillin-tazobactam (ZOSYN)  IV Stopped (01/25/17 0206)  . vancomycin 750 mg (01/24/17 0030)     LOS: 8 days   Time Spent in minutes   35 minutes  Esmerelda Finnigan D.O. on 01/25/2017 at 10:27 AM  Between 7am to 7pm - Pager - [phone]  After 7pm go to [email or web address] - password [email or web address]  And look for the night coverage person covering for me after hours  [facility] Office  [phone]

## 2017-01-25 NOTE — Progress Notes (Signed)
Pt arrived from 4E via bed, A&O, RA, no s/sx of distress, R FA IV with Pip-tazo infusing, telemetry box14, AV paced. Pt was oriented to unit, room and staff. Bed alarm on. Call light and telephone within reach, will continue to monitor.

## 2017-01-25 NOTE — Progress Notes (Signed)
PT Cancellation Note  Patient Details Name: Willie Andrade MRN: [id number] DOB: 1951/07/29   Cancelled Treatment:    Reason Eval/Treat Not Completed: Patient at procedure or test/unavailable (In hemodialysis all am.  Will check back as able. )   [name] [name] 01/25/2017, 11:06 AM  [name] [phone] (pager)

## 2017-01-25 NOTE — Progress Notes (Signed)
Subjective: Interval History: none.. Currently in hemodialysis. Reports pain in his incision and left foot. Once to have Percocet in addition to morphine. This is already written for but he has blood pressure 80 on dialysis this morning.  Objective: Vital signs in last 24 hours: Temp:  [97.3 F (36.3 C)-98.2 F (36.8 C)] 97.4 F (36.3 C) (06/28 0737) Pulse Rate:  [41-145] 62 (06/28 0900) Resp:  [8-35] 15 (06/28 0737) BP: (70-123)/(45-94) 78/45 (06/28 0900) SpO2:  [90 %-100 %] 93 % (06/28 0737) Arterial Line BP: (82-122)/(30-106) 110/106 (06/27 1700) Weight:  [152 lb 12.5 oz (69.3 kg)] 152 lb 12.5 oz (69.3 kg) (06/28 0449)  Intake/Output from previous day: 06/27 0701 - 06/28 0700 In: 1530 [P.O.:480; I.V.:1000; IV Piggyback:50] Out: 150 [Blood:150] Intake/Output this shift: No intake/output data recorded.  All of his surgical incisions on his left groin thigh knee and calf region are healing without hematoma. Has an easily palpable subcutaneous graft pulse.  Lab Results:  Recent Labs  01/24/17 0534 01/25/17 0347  WBC 8.6 13.0*  HGB 12.1* 10.7*  HCT 40.1 36.6*  PLT 144* 171   BMET  Recent Labs  01/24/17 0534 01/25/17 0347  NA 136 137  K 4.5 4.4  CL 96* 97*  CO2 23 25  GLUCOSE 62* 151*  BUN 12 20  CREATININE 5.23* 6.37*  CALCIUM 7.4* 7.2*    Studies/Results: Dg Toe 3rd Left  Result Date: 01/17/2017 CLINICAL DATA:  Acute onset of left foot swelling and pain. Open sore at the left third toe. Initial encounter. EXAM: LEFT THIRD TOE COMPARISON:  None. FINDINGS: The pattern of air at the distal left fourth toe raises question for infection with a gas producing organism, with overlying soft tissue disruption. No definite osseous erosions are characterized to suggest osteomyelitis, though it cannot be excluded on the basis of radiograph. Soft tissue swelling is noted about the toes. There is no evidence of fracture or dislocation. No radiopaque foreign bodies are seen.  IMPRESSION: 1. Pattern of air at the distal left fourth toe raises question for infection with a gas producing organism, with overlying soft tissue disruption. Soft tissue swelling noted about the toes. 2. No definite osseous erosion seen to suggest osteomyelitis, though it cannot be excluded on the basis of radiograph. These results were called by telephone at the time of interpretation on 01/17/2017 at 12:16 am to [facility] PA, who verbally acknowledged these results. Electronically Signed   By: Roanna Raider M.D.   On: 01/17/2017 00:16   Anti-infectives: Anti-infectives    Start     Dose/Rate Route Frequency Ordered Stop   01/23/17 0915  ceFAZolin (ANCEF) IVPB 2g/100 mL premix    Comments:  Send with pt to OR   2 g 200 mL/hr over 30 Minutes Intravenous To ShortStay Surgical 01/23/17 0910 01/24/17 0806   01/18/17 1200  vancomycin (VANCOCIN) IVPB 750 mg/150 ml premix     750 mg 150 mL/hr over 60 Minutes Intravenous Every T-Th-Sa (Hemodialysis) 01/17/17 0128     01/17/17 1000  piperacillin-tazobactam (ZOSYN) IVPB 3.375 g     3.375 g 12.5 mL/hr over 240 Minutes Intravenous Every 12 hours 01/17/17 0128     01/17/17 0130  vancomycin (VANCOCIN) 1,250 mg in sodium chloride 0.9 % 250 mL IVPB     1,250 mg 166.7 mL/hr over 90 Minutes Intravenous  Once 01/17/17 0119 01/17/17 0351   01/17/17 0045  vancomycin (VANCOCIN) IVPB 1000 mg/200 mL premix  Status:  Discontinued     1,000  mg 200 mL/hr over 60 Minutes Intravenous  Once 01/17/17 0042 01/17/17 0118   01/17/17 0045  piperacillin-tazobactam (ZOSYN) IVPB 3.375 g     3.375 g 100 mL/hr over 30 Minutes Intravenous  Once 01/17/17 0042 01/17/17 0138      Assessment/Plan: s/p Procedure(s): Left Leg  FEMORAL-PERONEAL Bypass Graft (Left) Stable postop day #1. Should be able to transfer to 6 E. renal floor if he remains hemodynamically stable on hemodialysis.   LOS: 8 days   Willie Andrade 01/25/2017, 9:13 AM

## 2017-01-26 ENCOUNTER — Encounter (HOSPITAL_COMMUNITY): Payer: Self-pay | Admitting: Family Medicine

## 2017-01-26 ENCOUNTER — Observation Stay (HOSPITAL_COMMUNITY)
Admission: EM | Admit: 2017-01-26 | Discharge: 2017-01-28 | Disposition: A | Discharge status: Home / Self Care | Payer: Medicare Other | Attending: Internal Medicine | Admitting: Internal Medicine

## 2017-01-26 ENCOUNTER — Ambulatory Visit (HOSPITAL_COMMUNITY): Payer: Medicare Other

## 2017-01-26 DIAGNOSIS — N186 End stage renal disease: Secondary | ICD-10-CM | POA: Diagnosis not present

## 2017-01-26 DIAGNOSIS — Y9301 Activity, walking, marching and hiking: Secondary | ICD-10-CM | POA: Insufficient documentation

## 2017-01-26 DIAGNOSIS — R531 Weakness: Secondary | ICD-10-CM | POA: Diagnosis not present

## 2017-01-26 DIAGNOSIS — I130 Hypertensive heart and chronic kidney disease with heart failure and stage 1 through stage 4 chronic kidney disease, or unspecified chronic kidney disease: Secondary | ICD-10-CM | POA: Diagnosis present

## 2017-01-26 DIAGNOSIS — F17210 Nicotine dependence, cigarettes, uncomplicated: Secondary | ICD-10-CM | POA: Insufficient documentation

## 2017-01-26 DIAGNOSIS — Z95810 Presence of automatic (implantable) cardiac defibrillator: Secondary | ICD-10-CM | POA: Insufficient documentation

## 2017-01-26 DIAGNOSIS — I5022 Chronic systolic (congestive) heart failure: Secondary | ICD-10-CM | POA: Diagnosis not present

## 2017-01-26 DIAGNOSIS — I951 Orthostatic hypotension: Principal | ICD-10-CM | POA: Insufficient documentation

## 2017-01-26 DIAGNOSIS — Y998 Other external cause status: Secondary | ICD-10-CM | POA: Insufficient documentation

## 2017-01-26 DIAGNOSIS — W19XXXA Unspecified fall, initial encounter: Secondary | ICD-10-CM | POA: Insufficient documentation

## 2017-01-26 DIAGNOSIS — R2681 Unsteadiness on feet: Secondary | ICD-10-CM | POA: Insufficient documentation

## 2017-01-26 DIAGNOSIS — Z79899 Other long term (current) drug therapy: Secondary | ICD-10-CM | POA: Insufficient documentation

## 2017-01-26 DIAGNOSIS — T8130XA Disruption of wound, unspecified, initial encounter: Secondary | ICD-10-CM | POA: Diagnosis present

## 2017-01-26 DIAGNOSIS — Z992 Dependence on renal dialysis: Secondary | ICD-10-CM | POA: Insufficient documentation

## 2017-01-26 DIAGNOSIS — I959 Hypotension, unspecified: Secondary | ICD-10-CM

## 2017-01-26 DIAGNOSIS — T8131XA Disruption of external operation (surgical) wound, not elsewhere classified, initial encounter: Secondary | ICD-10-CM | POA: Insufficient documentation

## 2017-01-26 DIAGNOSIS — I70262 Atherosclerosis of native arteries of extremities with gangrene, left leg: Secondary | ICD-10-CM | POA: Insufficient documentation

## 2017-01-26 DIAGNOSIS — I132 Hypertensive heart and chronic kidney disease with heart failure and with stage 5 chronic kidney disease, or end stage renal disease: Secondary | ICD-10-CM | POA: Diagnosis present

## 2017-01-26 DIAGNOSIS — Z95828 Presence of other vascular implants and grafts: Secondary | ICD-10-CM

## 2017-01-26 DIAGNOSIS — D631 Anemia in chronic kidney disease: Secondary | ICD-10-CM | POA: Insufficient documentation

## 2017-01-26 DIAGNOSIS — Y838 Other surgical procedures as the cause of abnormal reaction of the patient, or of later complication, without mention of misadventure at the time of the procedure: Secondary | ICD-10-CM | POA: Insufficient documentation

## 2017-01-26 DIAGNOSIS — Y92481 Parking lot as the place of occurrence of the external cause: Secondary | ICD-10-CM | POA: Insufficient documentation

## 2017-01-26 DIAGNOSIS — E785 Hyperlipidemia, unspecified: Secondary | ICD-10-CM | POA: Insufficient documentation

## 2017-01-26 LAB — CBC
HEMATOCRIT: 37.7 % — AB (ref 39.0–52.0)
Hemoglobin: 10.7 g/dL — ABNORMAL LOW (ref 13.0–17.0)
MCH: 26.6 pg (ref 26.0–34.0)
MCHC: 28.4 g/dL — AB (ref 30.0–36.0)
MCV: 93.5 fL (ref 78.0–100.0)
Platelets: 139 10*3/uL — ABNORMAL LOW (ref 150–400)
RBC: 4.03 MIL/uL — ABNORMAL LOW (ref 4.22–5.81)
RDW: 18.4 % — AB (ref 11.5–15.5)
WBC: 7.5 10*3/uL (ref 4.0–10.5)

## 2017-01-26 LAB — RENAL FUNCTION PANEL
Albumin: 3.1 g/dL — ABNORMAL LOW (ref 3.5–5.0)
Anion gap: 15 (ref 5–15)
BUN: 12 mg/dL (ref 6–20)
CO2: 26 mmol/L (ref 22–32)
Calcium: 7.6 mg/dL — ABNORMAL LOW (ref 8.9–10.3)
Chloride: 95 mmol/L — ABNORMAL LOW (ref 101–111)
Creatinine, Ser: 4.98 mg/dL — ABNORMAL HIGH (ref 0.61–1.24)
GFR calc Af Amer: 13 mL/min — ABNORMAL LOW (ref >60)
GFR calc non Af Amer: 11 mL/min — ABNORMAL LOW (ref >60)
Glucose, Bld: 96 mg/dL (ref 65–99)
Phosphorus: 3.6 mg/dL (ref 2.5–4.6)
Potassium: 4 mmol/L (ref 3.5–5.1)
Sodium: 136 mmol/L (ref 135–145)

## 2017-01-26 LAB — BASIC METABOLIC PANEL
Anion gap: 15 (ref 5–15)
BUN: 14 mg/dL (ref 6–20)
CALCIUM: 7.4 mg/dL — AB (ref 8.9–10.3)
CHLORIDE: 95 mmol/L — AB (ref 101–111)
CO2: 26 mmol/L (ref 22–32)
CREATININE: 5.01 mg/dL — AB (ref 0.61–1.24)
GFR calc non Af Amer: 11 mL/min — ABNORMAL LOW (ref >60)
GFR, EST AFRICAN AMERICAN: 13 mL/min — AB (ref >60)
Glucose, Bld: 98 mg/dL (ref 65–99)
Potassium: 3.9 mmol/L (ref 3.5–5.1)
Sodium: 136 mmol/L (ref 135–145)

## 2017-01-26 MED ORDER — CALCIUM ACETATE (PHOS BINDER) 667 MG PO CAPS
2001.0000 mg | ORAL_CAPSULE | Freq: Three times a day (TID) | ORAL | Status: DC
  Administered 2017-01-27 – 2017-01-28 (×3): 2001 mg via ORAL
  Filled 2017-01-26 (×3): qty 3

## 2017-01-26 MED ORDER — ACETAMINOPHEN 325 MG PO TABS: 650.0000 mg | ORAL_TABLET | Freq: Four times a day (QID) | ORAL | Status: DC | PRN

## 2017-01-26 MED ORDER — NEPRO/CARBSTEADY PO LIQD
237.0000 mL | Freq: Two times a day (BID) | ORAL | Status: DC
  Administered 2017-01-27 – 2017-01-28 (×2): 237 mL via ORAL

## 2017-01-26 MED ORDER — ONDANSETRON HCL 4 MG PO TABS: 4.0000 mg | ORAL_TABLET | Freq: Four times a day (QID) | ORAL | Status: DC | PRN

## 2017-01-26 MED ORDER — RENA-VITE PO TABS: 1.0000 | ORAL_TABLET | Freq: Every day | ORAL | 0 refills | Status: DC

## 2017-01-26 MED ORDER — SEVELAMER CARBONATE 800 MG PO TABS: 3200.0000 mg | ORAL_TABLET | Freq: Three times a day (TID) | ORAL | 0 refills | Status: DC

## 2017-01-26 MED ORDER — OXYCODONE-ACETAMINOPHEN 5-325 MG PO TABS: 1.0000 | ORAL_TABLET | ORAL | 0 refills | Status: DC | PRN

## 2017-01-26 MED ORDER — SEVELAMER CARBONATE 800 MG PO TABS
3200.0000 mg | ORAL_TABLET | Freq: Three times a day (TID) | ORAL | Status: DC
  Administered 2017-01-27 – 2017-01-28 (×3): 3200 mg via ORAL
  Filled 2017-01-26 (×3): qty 4

## 2017-01-26 MED ORDER — HEPARIN SODIUM (PORCINE) 5000 UNIT/ML IJ SOLN
5000.0000 [IU] | Freq: Three times a day (TID) | INTRAMUSCULAR | Status: DC
  Administered 2017-01-26 – 2017-01-28 (×4): 5000 [IU] via SUBCUTANEOUS
  Filled 2017-01-26 (×5): qty 1

## 2017-01-26 MED ORDER — NEPRO/CARBSTEADY PO LIQD: 237.0000 mL | Freq: Two times a day (BID) | ORAL | 0 refills | Status: DC

## 2017-01-26 MED ORDER — CINACALCET HCL 30 MG PO TABS: 180.0000 mg | ORAL_TABLET | ORAL | 0 refills | Status: DC

## 2017-01-26 MED ORDER — OXYCODONE-ACETAMINOPHEN 5-325 MG PO TABS
1.0000 | ORAL_TABLET | ORAL | Status: DC | PRN
  Administered 2017-01-27 – 2017-01-28 (×7): 2 via ORAL
  Filled 2017-01-26 (×5): qty 2

## 2017-01-26 MED ORDER — ONDANSETRON HCL 4 MG/2ML IJ SOLN: 4.0000 mg | Freq: Four times a day (QID) | INTRAMUSCULAR | Status: DC | PRN

## 2017-01-26 MED ORDER — ACETAMINOPHEN 650 MG RE SUPP: 650.0000 mg | Freq: Four times a day (QID) | RECTAL | Status: DC | PRN

## 2017-01-26 MED ORDER — POLYETHYLENE GLYCOL 3350 17 G PO PACK: 17.0000 g | PACK | Freq: Every day | ORAL | Status: DC | PRN

## 2017-01-26 MED ORDER — CINACALCET HCL 30 MG PO TABS
180.0000 mg | ORAL_TABLET | ORAL | Status: DC
  Administered 2017-01-27: 180 mg via ORAL
  Filled 2017-01-26: qty 6

## 2017-01-26 MED ORDER — RENA-VITE PO TABS
1.0000 | ORAL_TABLET | Freq: Every day | ORAL | Status: DC
  Administered 2017-01-26 – 2017-01-27 (×2): 1 via ORAL
  Filled 2017-01-26 (×2): qty 1

## 2017-01-26 MED ORDER — GABAPENTIN 100 MG PO CAPS
100.0000 mg | ORAL_CAPSULE | Freq: Two times a day (BID) | ORAL | Status: DC
  Administered 2017-01-26 – 2017-01-28 (×4): 100 mg via ORAL
  Filled 2017-01-26 (×5): qty 1

## 2017-01-26 MED ORDER — MIDODRINE HCL 5 MG PO TABS
5.0000 mg | ORAL_TABLET | Freq: Once | ORAL | Status: DC
Start: 2017-01-26 — End: 2017-01-28
  Filled 2017-01-26 (×2): qty 1

## 2017-01-26 MED ORDER — MIDODRINE HCL 5 MG PO TABS
5.0000 mg | ORAL_TABLET | Freq: Two times a day (BID) | ORAL | Status: DC
  Filled 2017-01-26: qty 1

## 2017-01-26 NOTE — H&P (Signed)
History and Physical    Willie Andrade ZOX:[id number] DOB: 1951/07/14 DOA: 01/26/2017  PCP: [name], MD   Patient coming from: Home  Chief Complaint: Lightheadedness, gen weakness, fall  HPI: Willie Andrade is a 66 y.o. male with medical history significant for end-stage renal disease on hemodialysis, chronic systolic CHF with ICD, chronic hypotension, and anemia, presenting to the emergency department with lightheadedness, generalized weakness, and fall. Patient was just discharged from the hospital earlier in the day after being admitted on 01/16/2017 for management of dry gangrene involving the left foot in the setting of severe PAD, was treated with IV antibiotics and revascularization of the leg, underwent hemodialysis while inpatient, and was discharged home in much improved and stable condition, but unfortunately became lightheaded and weak while leaving the hospital, following in the parking lot, and being unable to get up. Prior to the recent admission, patient reports that he was able to ambulate alone unassisted without difficulty and is able to perform all of his ADLs and care for himself. He lives with his sister who is at the bedside. She reports that she is too weak herself and unable to care for the patient and will be unable to bring him back to her home if he cannot care for himself. Willie Andrade denies any fevers or chills, denies chest pain or palpitations, and denies abdominal pain, dysuria, or flank pain. He denies hitting his head when he fell and denies any significant pain or injury associated with this.  ED Course: Upon arrival to the ED, patient is found to be afebrile, saturating well on room air, with blood pressure of 88/56, and normal heart rate of respirations. Chemistry panel is largely unremarkable and CBC features a stable normocytic anemia and a stable mild thrombocytopenia. Patient was treated with Midrin in the ED for low blood pressures. He had just undergone  PT and OT evaluations in the hospital and was being prepared for possible discharge home from the ED today, when his sister raise concern for the low blood pressures, taking pictures and recordings of the monitor in the ED. She reports being unable to care for the patient and his current state with generalized weakness and requiring assistance to ambulate. Patient remained stable in the ED with chronically low blood pressures. There has not been any respiratory distress. He will be observed on the medical-surgical unit for ongoing evaluation and management of generalized weakness, possibly deconditioning, resulting in a new inability to ambulate on his own without falling.  Review of Systems:  All other systems reviewed and apart from HPI, are negative.  Past Medical History:  Diagnosis Date  . Chronic systolic CHF (congestive heart failure), NYHA class 2 (HCC)   . ESRD (end stage renal disease) on dialysis (HCC)   . Hepatitis B   . HTN (hypertension)   . Hyperlipidemia   . 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 Tobacco abuse     Past Surgical History:  Procedure Laterality Date  . ABDOMINAL AORTOGRAM W/LOWER EXTREMITY N/A 01/22/2017   Procedure: Abdominal Aortogram w/Lower Extremity;  Surgeon: [name], MD;  Location: [facility] INVASIVE CV LAB;  Service: Cardiovascular;  Laterality: N/A;  . BIV ICD GENERATOR CHANGEOUT N/A 11/28/2016   Procedure: BiV ICD [employer]; St Jude Surgeon: Will Jorja Loa, MD;  Location: [facility] INVASIVE CV LAB;  Service: Cardiovascular;  Laterality: N/A;  . BYPASS GRAFT FEMORAL-PERONEAL Left 01/24/2017   Procedure: Left Leg  FEMORAL-PERONEAL  Bypass Graft;  Surgeon: [name], MD;  Location: [facility] OR;  Service: Vascular;  Laterality: Left;     reports that he has been smoking Cigarettes.  He has been smoking about 0.00 packs per day. He has never used smokeless tobacco. He reports that he does not drink  alcohol or use drugs.  No Known Allergies  Family History  Problem Relation Age of Onset  . Diabetes Mother   . Gout Mother   . Stroke Father   . Gout Maternal Grandmother   . Gout Sister   . Asthma Brother      Prior to Admission medications   Medication Sig Start Date End Date Taking? Authorizing Provider  calcium acetate (PHOSLO) 667 MG capsule Take 3 capsules (2,001 mg total) by mouth 3 (three) times daily with meals. 12/23/16  Yes Clydia Llano, MD  cinacalcet (SENSIPAR) 30 MG tablet Take 6 tablets (180 mg total) by mouth every Tuesday, Thursday, and Saturday at 6 PM. 01/27/17  Yes Mikhail, [place]ph[place], DO  gabapentin (NEURONTIN) 100 MG capsule Take 100 mg by mouth 2 (two) times daily.    Yes [provider]  lidocaine-prilocaine (EMLA) cream Apply 1 application topically Every Tuesday,Thursday,and Saturday with dialysis.   Yes [provider]  midodrine (PROAMATINE) 5 MG tablet Take 1 tablet (5 mg total) by mouth 2 (two) times daily. 12/23/16  Yes Clydia Llano, MD  multivitamin (RENA-VIT) TABS tablet Take 1 tablet by mouth at bedtime. 01/26/17  Yes Mikhail, Nita Sells, DO  sevelamer carbonate (RENVELA) 800 MG tablet Take 4 tablets (3,200 mg total) by mouth 3 (three) times daily with meals. 01/26/17  Yes Mikhail, [place], DO  Nutritional Supplements (FEEDING SUPPLEMENT, NEPRO CARB STEADY,) LIQD Take 237 mLs by mouth 2 (two) times daily between meals. 01/27/17   Edsel Petrin, DO  oxyCODONE-acetaminophen (PERCOCET/ROXICET) 5-325 MG tablet Take 1-2 tablets by mouth every 3 (three) hours as needed for moderate pain. 01/26/17   Edsel Petrin, DO    Physical Exam: Vitals:   01/26/17 1745 01/26/17 1800 01/26/17 1815 01/26/17 1841  BP: (!) 85/61 (!) 86/64 (!) 84/65 98/67  Pulse:   65 68  Resp: 14 13 15 17   Temp:      TempSrc:      SpO2:   93% 92%      Constitutional: No acute respiratory distress. Lethargic. No pallor or diaphoresis.  Eyes: PERTLA, lids and  conjunctivae normal ENMT: Mucous membranes are moist. Posterior pharynx clear of any exudate or lesions.   Neck: normal, supple, no masses, no thyromegaly Respiratory: clear to auscultation bilaterally, no wheezing, no crackles. Normal respiratory effort. Cardiovascular: S1 & S2 heard, regular rate and rhythm. No significant JVD. Abdomen: No distension, no tenderness, no masses palpated. Bowel sounds normal.  Musculoskeletal: no clubbing / cyanosis. No joint deformity upper and lower extremities.  Skin: Surgical incision left medial thigh with partial dehiscence, serous drainage, steri-strips falling off. Poor turgor. Neurologic: CN 2-12 grossly intact. Patellar DTR normal. Generally weak, lethargic.  Psychiatric: Alert and oriented x 3. Calm and cooperative.     Labs on Admission: I have personally reviewed following labs and imaging studies  CBC:  Recent Labs Lab 01/22/17 0619 01/23/17 0316 01/24/17 0534 01/25/17 0347 01/26/17 0459  WBC 6.5 8.2 8.6 13.0* 7.5  HGB 13.0 12.3* 12.1* 10.7* 10.7*  HCT 43.3 40.8 40.1 36.6* 37.7*  MCV 92.3 89.5 90.9 91.0 93.5  PLT 130* 179 144* 171 139*   Basic Metabolic Panel:  Recent Labs Lab 01/20/17  1610 01/20/17 1818  01/23/17 0316 01/24/17 0534 01/25/17 0347 01/26/17 0459 01/26/17 1100  NA 135 133*  < > 136 136 137 136 136  K 4.7 3.5  < > 5.0 4.5 4.4 3.9 4.0  CL 93* 92*  < > 95* 96* 97* 95* 95*  CO2 25 28  < > 25 23 25 26 26   GLUCOSE 83 107*  < > 104* 62* 151* 98 96  BUN 24* 11  < > 26* 12 20 14 12   CREATININE 6.76* 4.45*  < > 8.22* 5.23* 6.37* 5.01* 4.98*  CALCIUM 7.6* 7.6*  < > 7.5* 7.4* 7.2* 7.4* 7.6*  PHOS 3.4 2.8  --   --   --   --   --  3.6  < > = values in this interval not displayed. GFR: Estimated Creatinine Clearance: 14.3 mL/min (A) (by C-G formula based on SCr of 4.98 mg/dL (H)). Liver Function Tests:  Recent Labs Lab 01/20/17 0746 01/20/17 1818 01/26/17 1100  ALBUMIN 3.0* 2.8* 3.1*   No results for input(s):  LIPASE, AMYLASE in the last 168 hours. No results for input(s): AMMONIA in the last 168 hours. Coagulation Profile:  Recent Labs Lab 01/22/17 0619 01/24/17 1845  INR 1.33 1.28   Cardiac Enzymes: No results for input(s): CKTOTAL, CKMB, CKMBINDEX, TROPONINI in the last 168 hours. BNP (last 3 results) No results for input(s): PROBNP in the last 8760 hours. HbA1C: No results for input(s): HGBA1C in the last 72 hours. CBG: No results for input(s): GLUCAP in the last 168 hours. Lipid Profile: No results for input(s): CHOL, HDL, LDLCALC, TRIG, CHOLHDL, LDLDIRECT in the last 72 hours. Thyroid Function Tests: No results for input(s): TSH, T4TOTAL, FREET4, T3FREE, THYROIDAB in the last 72 hours. Anemia Panel: No results for input(s): VITAMINB12, FOLATE, FERRITIN, TIBC, IRON, RETICCTPCT in the last 72 hours. Urine analysis: No results found for: COLORURINE, APPEARANCEUR, LABSPEC, PHURINE, GLUCOSEU, HGBUR, BILIRUBINUR, KETONESUR, PROTEINUR, UROBILINOGEN, NITRITE, LEUKOCYTESUR Sepsis Labs: @LABRCNTIP (procalcitonin:4,lacticidven:4) ) Recent Results (from the past 240 hour(s))  MRSA PCR Screening     Status: None   Collection Time: 01/18/17  4:50 AM  Result Value Ref Range Status   MRSA by PCR NEGATIVE NEGATIVE Final    Comment:        The GeneXpert MRSA Assay (FDA approved for NASAL specimens only), is one component of a comprehensive MRSA colonization surveillance program. It is not intended to diagnose MRSA infection nor to guide or monitor treatment for MRSA infections.   Surgical PCR screen     Status: None   Collection Time: 01/19/17  5:27 AM  Result Value Ref Range Status   MRSA, PCR NEGATIVE NEGATIVE Final   Staphylococcus aureus NEGATIVE NEGATIVE Final    Comment:        The Xpert SA Assay (FDA approved for NASAL specimens in patients over 80 years of age), is one component of a comprehensive surveillance program.  Test performance has been validated by [facility]  for patients greater than or equal to 35 year old. It is not intended to diagnose infection nor to guide or monitor treatment.      Radiological Exams on Admission: No results found.  EKG: Not performed.   Assessment/Plan  1. Generalized weakness with fall  - Pt reports being able to ambulate unassisted and care for himself at baseline, but too lightheaded and generally weak to ambulate short distance today, resulting in a fall without hitting head or appreciable injury  - No acute or reversible  illness is identified, likely secondary to deconditioning after 10-day hospital stay with surgery  - Will ask PT to eval and treat  2. ESRD on HD  - Pt dialyzed on 6/28 in hospital; has AVF in LUE  - No volume issues, significant electrolyte abnormalities, or uremia on admission   - SLIV, fluid-restricted renal diet  - Continue binders    3. PAD  - Treated during recent admission with left femoral-peroneal bypass graft, POD #2   - Foot is warm on presentation  4. Wound dehiscence  - Surgical wound in medial left thigh with some separation, steri-strips falling off, serous weeping  - Cleaned and steri-strips replaced in ED  - Continue wound-care     5. Chronic systolic CHF  - TTE (12/23/16) with EF 15-20%, diffuse HK  - No fluid-overload on presenation - SLIV and fluid-restrict diet, follow I/O's and daily wts   6. Hypotension  - Appears to be chronic  - MAP never below 65 in ED  - No chest pain or headache  - Continue midodrine    DVT prophylaxis: sq heparin  Code Status: Full  Family Communication: Sister updated at bedside Disposition Plan: Observe on med-surg Consults called: None Admission status: Observation    Briscoe Deutscher, MD [facility] Pager [phone]  If 7PM-7AM, please contact night-coverage [email or web address] Password [facility]  01/26/2017, 7:38 PM

## 2017-01-26 NOTE — Progress Notes (Signed)
Willie Andrade to be D/C'd Home per MD order.  Discussed prescriptions and follow up appointments with the patient. Prescriptions given to patient, medication list explained in detail. Pt verbalized understanding.  Allergies as of 01/26/2017   No Known Allergies     Medication List    TAKE these medications   calcium acetate 667 MG capsule Commonly known as:  PHOSLO Take 3 capsules (2,001 mg total) by mouth 3 (three) times daily with meals.   cinacalcet 30 MG tablet Commonly known as:  SENSIPAR Take 6 tablets (180 mg total) by mouth every Tuesday, Thursday, and Saturday at 6 PM. Start taking on:  01/27/2017 What changed:  how much to take  when to take this  additional instructions   feeding supplement (NEPRO CARB STEADY) Liqd Take 237 mLs by mouth 2 (two) times daily between meals. Start taking on:  01/27/2017   gabapentin 100 MG capsule Commonly known as:  NEURONTIN Take 100 mg by mouth 2 (two) times daily.   lidocaine-prilocaine cream Commonly known as:  EMLA Apply 1 application topically Every Tuesday,Thursday,and Saturday with dialysis.   midodrine 5 MG tablet Commonly known as:  PROAMATINE Take 1 tablet (5 mg total) by mouth 2 (two) times daily.   multivitamin Tabs tablet Take 1 tablet by mouth at bedtime.   oxyCODONE-acetaminophen 5-325 MG tablet Commonly known as:  PERCOCET/ROXICET Take 1-2 tablets by mouth every 3 (three) hours as needed for moderate pain.   sevelamer carbonate 800 MG tablet Commonly known as:  RENVELA Take 4 tablets (3,200 mg total) by mouth 3 (three) times daily with meals.       Vitals:   01/26/17 0402 01/26/17 0900  BP: (!) 90/57 90/65  Pulse: 71 62  Resp: 18 18  Temp: 97.5 F (36.4 C) 97.5 F (36.4 C)    Skin clean, dry and intact without evidence of skin break down, no evidence of skin tears noted. IV catheter discontinued intact. Site without signs and symptoms of complications. Dressing and pressure applied. Pt denies  pain at this time. No complaints noted.  An After Visit Summary was printed and given to the patient. Patient escorted via WC, and D/C home via private auto.  Nelma Rothman, RN [facility] Phone [phone]

## 2017-01-26 NOTE — ED Notes (Signed)
Dr. Madilyn Hook aware of VS.

## 2017-01-26 NOTE — Care Management Obs Status (Signed)
MEDICARE OBSERVATION STATUS NOTIFICATION   Patient Details  Name: Willie Andrade MRN: [id number] Date of Birth: [birth year]   Medicare Observation Status Notification Given:  Waylan Boga, RN 01/26/2017, 8:43 PM

## 2017-01-26 NOTE — Progress Notes (Addendum)
Pharmacy Antibiotic Note  Willie Andrade is a 66 y.o. male admitted on 01/16/2017 with cellulitis.  Pharmacy has been consulted for Vancomycin/Zosyn dosing - day #10. WBC WNL and afebrile. ESRD on HD TTS - on schedule/tolerating. Vascular Surgery following - s/p angiogram 6/25,  S/p L femoral to peroneal bypass w/ saphenous vein scheduled for 6/27.   Plan: Vancomycin 750mg  IV QHD TTS Zosyn 3.375gm IV Q12H (4 hr inf)  Need to check pre-HD vanc level tomorrow 6/30 if vancomycin continued.  Current plan is to discharge patient today, off antibiotics per Dr. Catha Gosselin.  Please dc vanz/zosyn if pt remains in hospital   Vancomycin 6/20>> Zosyn 6/20>>  6/21 MRSA - NEG  Height: 6\' 2"  (188 cm) Weight: 153 lb (69.4 kg) IBW/kg (Calculated) : 82.2  Temp (24hrs), Avg:97.9 F (36.6 C), Min:97.5 F (36.4 C), Max:98.4 F (36.9 C)   Recent Labs Lab 01/22/17 0619 01/23/17 0316 01/24/17 0534 01/25/17 0347 01/26/17 0459 01/26/17 1100  WBC 6.5 8.2 8.6 13.0* 7.5  --   CREATININE 6.83* 8.22* 5.23* 6.37* 5.01* 4.98*    Estimated Creatinine Clearance: 14.3 mL/min (A) (by C-G formula based on SCr of 4.98 mg/dL (H)).    No Known Allergies  Noah Delaine, RPh Clinical Pharmacist Pager: [phone] Rx Phone # for today: [phone] After 330PM, please call Main Rx: [phone] 01/26/2017 3:32 PM   ADDENDUM: spoke to Dr. Catha Gosselin- Discontinue IV vancomycin and Zosyn now.   Noah Delaine, RPh Clinical Pharmacist Pager: [phone] 01/26/2017 3:58 PM

## 2017-01-26 NOTE — ED Triage Notes (Signed)
Pt c/o L leg pain post fall, pt hypotensive, pts family at bedside states, "he was just discharged and had not even got to the car & fell in the parking  Lot, pt has wound to L leg that is slightly open, & wound to L upper leg that is bleeding, guaze in place, pt states, "I just had surgery." pt A&O x4, appears weak

## 2017-01-26 NOTE — Progress Notes (Signed)
[facility] Progress Note   Subjective: Asking for chocolate graham cracker bears! Says L leg still pain, now with steri strips upper portion of incision near knee. Per [facility] note, stable for DC from their prospective. NAD.   Objective Vitals:   01/25/17 2043 01/26/17 0402 01/26/17 0428 01/26/17 0900  BP: (!) 88/54 (!) 90/57  90/65  Pulse: 70 71  62  Resp: 20 18  18   Temp: 98.4 F (36.9 C) 97.5 F (36.4 C)  97.5 F (36.4 C)  TempSrc: Oral Oral  Oral  SpO2: 100% 100%  94%  Weight: 69.4 kg (153 lb)  69.4 kg (153 lb)   Height:       Physical Exam General: thin elderly male in NAD Heart: S1,S2 2/6 systolic M Lungs: CTAB, no WOB Abdomen: active BS Extremities: L leg incision with steri strips upper portion near knee,very sm area dehiscence noted and 1 steri strip above knee.  Dialysis Access: LFA AVF + bruit   Additional Objective Labs: Basic Metabolic Panel:  Recent Labs Lab 01/20/17 0746 01/20/17 1818  01/24/17 0534 01/25/17 0347 01/26/17 0459  NA 135 133*  < > 136 137 136  K 4.7 3.5  < > 4.5 4.4 3.9  CL 93* 92*  < > 96* 97* 95*  CO2 25 28  < > 23 25 26   GLUCOSE 83 107*  < > 62* 151* 98  BUN 24* 11  < > 12 20 14   CREATININE 6.76* 4.45*  < > 5.23* 6.37* 5.01*  CALCIUM 7.6* 7.6*  < > 7.4* 7.2* 7.4*  PHOS 3.4 2.8  --   --   --   --   < > = values in this interval not displayed. Liver Function Tests:  Recent Labs Lab 01/20/17 0746 01/20/17 1818  ALBUMIN 3.0* 2.8*   No results for input(s): LIPASE, AMYLASE in the last 168 hours. CBC:  Recent Labs Lab 01/22/17 0619 01/23/17 0316 01/24/17 0534 01/25/17 0347 01/26/17 0459  WBC 6.5 8.2 8.6 13.0* 7.5  HGB 13.0 12.3* 12.1* 10.7* 10.7*  HCT 43.3 40.8 40.1 36.6* 37.7*  MCV 92.3 89.5 90.9 91.0 93.5  PLT 130* 179 144* 171 139*   Blood Culture    Component Value Date/Time   SDES BLOOD RIGHT ANTECUBITAL 12/21/2016 2041   SPECREQUEST  12/21/2016 2041    BOTTLES DRAWN AEROBIC AND ANAEROBIC Blood  Culture adequate volume   CULT NO GROWTH 5 DAYS 12/21/2016 2041   REPTSTATUS 12/26/2016 FINAL 12/21/2016 2041    Cardiac Enzymes: No results for input(s): CKTOTAL, CKMB, CKMBINDEX, TROPONINI in the last 168 hours. CBG: No results for input(s): GLUCAP in the last 168 hours. Iron Studies: No results for input(s): IRON, TIBC, TRANSFERRIN, FERRITIN in the last 72 hours. @lablastinr3 @ Studies/Results: No results found. Medications: . sodium chloride    . sodium chloride    . sodium chloride    . piperacillin-tazobactam (ZOSYN)  IV 3.375 g (01/26/17 1036)  . vancomycin Stopped (01/25/17 1156)   . calcitRIOL  1 mcg Oral Q T,Th,Sa-HD  . cinacalcet  180 mg Oral Q T,Th,Sat-1800  . docusate sodium  100 mg Oral Daily  . feeding supplement (NEPRO CARB STEADY)  237 mL Oral BID BM  . gabapentin  100 mg Oral BID  . heparin subcutaneous  5,000 Units Subcutaneous Q8H  .  HYDROmorphone (DILAUDID) injection  1 mg Intravenous Once in dialysis  . midodrine  5 mg Oral BID WC  . multivitamin  1 tablet Oral QHS  .  sevelamer carbonate  3,200 mg Oral TID WC    D[facility]: [facility] 3[facility]  68kg  2/2 bath  400/800  Hep 1400   LFA AVF -Sensipar 180 mg PO TIW (last PTH 1527 12/26/16) -Calcitriol 1 mcg PO TIW -Venofer 50 mg IV weekly (last dose 01/11/17 Last Fe 40 Tsat 22% 12/26/16) -Mircera 75 mcg IV q w 2 weeks (Last HGB 10.7 01/11/17)   Assess/Plan: 1. 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 Pain control per primary.  2. ESRD: cont TTS HD. Next HD tomorrow at OP center if DC'd home 3.BP/volume: Usually hypotensive, on midodrine 5mg  BID. No edema. HD 01/26/17 Given NS during tx D/T being dry Net UF +1290 Post wt 69.4 kg. Consider raising EDW slightly on DC.  4. Anemia: Hgb 10.7 down from 12.3 pre op.  No ESA or iron for now. 5. MBD of CKD: Cont binders, sensipar VDRA. Ca 7.4 C Ca 8.36 6. Nutrition: Renal diet/renal vit/nepro 7. Systolic heart failure  (LVEF 16-10%) with ICD: per primary.   Rita H. Brown NP-C 01/26/2017, 10:49 AM  [facility] [phone]   Pt seen, examined and agree w A/P as above.  Vinson Moselle MD [employer] pager [phone]   01/26/2017, 11:15 AM

## 2017-01-26 NOTE — Evaluation (Signed)
Occupational Therapy Evaluation and Discharge Patient Details Name: Willie Andrade MRN: [id number] DOB: 1950-10-18 Today's Date: 01/26/2017    History of Present Illness Willie Andrade is a 66 y.o. male with history of ESRD on hemodialysis, nonischemic cardiomyopathy status post AICD, anemia who was recently admitted for presyncope presents to the ER because of worsening swelling and pain in the left foot. Patient has been having these symptoms for last 24 hours. Admitted 01/16/17 with left foot gangrene and diminished pulses. Placed on vancomycin and zosyn. Vascular surgery consulted, s/p Arteriogram with bilateral lower extremity runoff, bypass graft femoral-peroneal left. PMH:  ESRD, CHF, Hypotension   Clinical Impression   Pt reports he was independent with ADL PTA. Currently pt mod I with ADL and functional mobility. Pt planning to d/c home today where he will have intermittent supervision from family. No further acute OT needs identified; signing off at this time. Please re-consult if needs change. Thank you for this referral.    Follow Up Recommendations  No OT follow up;Supervision - Intermittent    Equipment Recommendations  None recommended by OT    Recommendations for Other Services       Precautions / Restrictions Precautions Precautions: Fall Restrictions Weight Bearing Restrictions: No      Mobility Bed Mobility Overal bed mobility: Independent                Transfers Overall transfer level: Modified independent               General transfer comment: mild unsteadiness initially with sit to stand, pt able to catch balance without physical assist    Balance Overall balance assessment: Needs assistance Sitting-balance support: Feet supported;No upper extremity supported Sitting balance-Leahy Scale: Normal     Standing balance support: No upper extremity supported;During functional activity Standing balance-Leahy Scale: Good                             ADL either performed or assessed with clinical judgement   ADL Overall ADL's : Modified independent;At baseline                                       General ADL Comments: Pt able to perform toilet transfer, functional mobility in room, and UB/LB dressing without assist.     Vision         Perception     Praxis      Pertinent Vitals/Pain Pain Assessment: Faces Faces Pain Scale: Hurts a little bit Pain Location: LLE with movement Pain Descriptors / Indicators: Grimacing;Guarding Pain Intervention(s): Monitored during session;Limited activity within patient's tolerance     Hand Dominance     Extremity/Trunk Assessment Upper Extremity Assessment Upper Extremity Assessment: Generalized weakness   Lower Extremity Assessment Lower Extremity Assessment: Defer to PT evaluation   Cervical / Trunk Assessment Cervical / Trunk Assessment: Normal   Communication Communication Communication: No difficulties   Cognition Arousal/Alertness: Awake/alert Behavior During Therapy: WFL for tasks assessed/performed Overall Cognitive Status: Within Functional Limits for tasks assessed                                 General Comments: slightly impulsive once he realized he was to d/c today   General Comments       Exercises     Shoulder  Instructions      Home Living Family/patient expects to be discharged to:: Private residence Living Arrangements: Other relatives Available Help at Discharge: Family;Available PRN/intermittently Type of Home: House Home Access: Stairs to enter [employer] of Steps: 3 Entrance Stairs-Rails: Right;Left;Can reach both Home Layout: One level     Bathroom Shower/Tub: Producer, television/film/video: Standard     Home Equipment: None          Prior Functioning/Environment Level of Independence: Independent        Comments: takes SCAT to HD        OT Problem List:         OT Treatment/Interventions:      OT Goals(Current goals can be found in the care plan section) Acute Rehab OT Goals Patient Stated Goal: to go home OT Goal Formulation: All assessment and education complete, DC therapy  OT Frequency:     Barriers to D/C:            Co-evaluation              AM-PAC PT "6 Clicks" Daily Activity     Outcome Measure Help from another person eating meals?: None Help from another person taking care of personal grooming?: None Help from another person toileting, which includes using toliet, bedpan, or urinal?: None Help from another person bathing (including washing, rinsing, drying)?: None Help from another person to put on and taking off regular upper body clothing?: None Help from another person to put on and taking off regular lower body clothing?: None 6 Click Score: 24   End of Session Nurse Communication: Mobility status;Other (comment) (pt removed tele, dressed and ready for d/c)  Activity Tolerance: Patient tolerated treatment well Patient left: in bed;with call bell/phone within reach;with family/visitor present (sitting EOB)  OT Visit Diagnosis: Unsteadiness on feet (R26.81)                Time: 6045-4098 OT Time Calculation (min): 29 min Charges:  OT General Charges $OT Visit: 1 Procedure OT Evaluation $OT Eval Moderate Complexity: 1 Procedure OT Treatments $Self Care/Home Management : 8-22 mins G-Codes:     Fredric Mare A. Brett Albino, M.S., OTR/L Pager: [phone]  Gaye Alken 01/26/2017, 3:45 PM

## 2017-01-26 NOTE — Progress Notes (Addendum)
[facility] Progress Note  Subjective  - POD #2  Feels ok. Requesting his breakfast order be changed.   Objective Vitals:   01/25/17 2043 01/26/17 0402  BP: (!) 88/54 (!) 90/57  Pulse: 70 71  Resp: 20 18  Temp: 98.4 F (36.9 C) 97.5 F (36.4 C)    Intake/Output Summary (Last 24 hours) at 01/26/17 1287 Last data filed at 01/26/17 0601  Gross per 24 hour  Intake              760 ml  Output            -1290 ml  Net             2050 ml   Left groin and leg incisions clean. Mild serous drainage from below knee incision.  Palpable left graft pulse.  Ulcerations left foot stable  Assessment/Planning: 66 y.o. male is s/p: left femoral to peroneal bypass graft 2 Days Post-Op   Palpable graft pulse. Mobilize.  Stable from vascular standpoint for d/c.   Raymond Gurney 01/26/2017 8:22 AM --  I have interviewed the patient and examined the patient. I agree with the findings by the PA. Easily palpable graft pulse. Foot warm.  Mild separation of incision, steri-stripped.   Cari Caraway, MD [phone]   Laboratory CBC    Component Value Date/Time   WBC 7.5 01/26/2017 0459   HGB 10.7 (L) 01/26/2017 0459   HCT 37.7 (L) 01/26/2017 0459   PLT 139 (L) 01/26/2017 0459    BMET    Component Value Date/Time   NA 136 01/26/2017 0459   K 3.9 01/26/2017 0459   CL 95 (L) 01/26/2017 0459   CO2 26 01/26/2017 0459   GLUCOSE 98 01/26/2017 0459   BUN 14 01/26/2017 0459   CREATININE 5.01 (H) 01/26/2017 0459   CALCIUM 7.4 (L) 01/26/2017 0459   GFRNONAA 11 (L) 01/26/2017 0459   GFRAA 13 (L) 01/26/2017 0459    COAG Lab Results  Component Value Date   INR 1.28 01/24/2017   INR 1.33 01/22/2017   INR 1.32 01/19/2017   No results found for: PTT  Antibiotics Anti-infectives    Start     Dose/Rate Route Frequency Ordered Stop   01/25/17 1055  Vancomycin (VANCOCIN) 750-5 MG/150ML-% IVPB    Comments:  Woodson, Vonshell   : cabinet override   01/25/17 1055 01/25/17 1100   01/23/17 0915  ceFAZolin (ANCEF) IVPB 2g/100 mL premix    Comments:  Send with pt to OR   2 g 200 mL/hr over 30 Minutes Intravenous To ShortStay [facility] 01/23/17 0910 01/24/17 0806   01/18/17 1200  vancomycin (VANCOCIN) IVPB 750 mg/150 ml premix     750 mg 150 mL/hr over 60 Minutes Intravenous Every T-Th-Sa (Hemodialysis) 01/17/17 0128     01/17/17 1000  piperacillin-tazobactam (ZOSYN) IVPB 3.375 g     3.375 g 12.5 mL/hr over 240 Minutes Intravenous Every 12 hours 01/17/17 0128     01/17/17 0130  vancomycin (VANCOCIN) 1,250 mg in sodium chloride 0.9 % 250 mL IVPB     1,250 mg 166.7 mL/hr over 90 Minutes Intravenous  Once 01/17/17 0119 01/17/17 0351   01/17/17 0045  vancomycin (VANCOCIN) IVPB 1000 mg/200 mL premix  Status:  Discontinued     1,000 mg 200 mL/hr over 60 Minutes Intravenous  Once 01/17/17 0042 01/17/17 0118   01/17/17 0045  piperacillin-tazobactam (ZOSYN) IVPB 3.375 g     3.375 g 100 mL/hr over 30 Minutes  Intravenous  Once 01/17/17 0042 01/17/17 0138       Maris Berger, PA-C [facility] Office: [phone] Pager: [phone] 01/26/2017 8:22 AM

## 2017-01-26 NOTE — Discharge Instructions (Signed)
Peripheral Vascular Disease Peripheral vascular disease (PVD) is a disease of the blood vessels that are not part of your heart and brain. A simple term for PVD is poor circulation. In most cases, PVD narrows the blood vessels that carry blood from your heart to the rest of your body. This can result in a decreased supply of blood to your arms, legs, and internal organs, like your stomach or kidneys. However, it most often affects a person's lower legs and feet. There are two types of PVD.  Organic PVD. This is the more common type. It is caused by damage to the structure of blood vessels.  Functional PVD. This is caused by conditions that make blood vessels contract and tighten (spasm).  Without treatment, PVD tends to get worse over time. PVD can also lead to acute ischemic limb. This is when an arm or limb suddenly has trouble getting enough blood. This is a medical emergency. What are the causes? Each type of PVD has many different causes. The most common cause of PVD is buildup of a fatty material (plaque) inside of your arteries (atherosclerosis). Small amounts of plaque can break off from the walls of the blood vessels and become lodged in a smaller artery. This blocks blood flow and can cause acute ischemic limb. Other common causes of PVD include:  Blood clots that form inside of blood vessels.  Injuries to blood vessels.  Diseases that cause inflammation of blood vessels or cause blood vessel spasms.  Health behaviors and health history that increase your risk of developing PVD.  What increases the risk? You may have a greater risk of PVD if you:  Have a family history of PVD.  Have certain medical conditions, including: ? High cholesterol. ? Diabetes. ? High blood pressure (hypertension). ? Coronary heart disease. ? Past problems with blood clots. ? Past injury, such as burns or a broken bone. These may have damaged blood vessels in your limbs. ? Buerger disease. This is  caused by inflamed blood vessels in your hands and feet. ? Some forms of arthritis. ? Rare birth defects that affect the arteries in your legs.  Use tobacco.  Do not get enough exercise.  Are obese.  Are age 50 or older.  What are the signs or symptoms? PVD may cause many different symptoms. Your symptoms depend on what part of your body is not getting enough blood. Some common signs and symptoms include:  Cramps in your lower legs. This may be a symptom of poor leg circulation (claudication).  Pain and weakness in your legs while you are physically active that goes away when you rest (intermittent claudication).  Leg pain when at rest.  Leg numbness, tingling, or weakness.  Coldness in a leg or foot, especially when compared with the other leg.  Skin or hair changes. These can include: ? Hair loss. ? Shiny skin. ? Pale or bluish skin. ? Thick toenails.  Inability to get or maintain an erection (erectile dysfunction).  People with PVD are more prone to developing ulcers and sores on their toes, feet, or legs. These may take longer than normal to heal. How is this diagnosed? Your health care provider may diagnose PVD from your signs and symptoms. The health care provider will also do a physical exam. You may have tests to find out what is causing your PVD and determine its severity. Tests may include:  Blood pressure recordings from your arms and legs and measurements of the strength of your pulses (  pulse volume recordings).  Imaging studies using sound waves to take pictures of the blood flow through your blood vessels (Doppler ultrasound).  Injecting a dye into your blood vessels before having imaging studies using: ? X-rays (angiogram or arteriogram). ? Computer-generated X-rays (CT angiogram). ? A powerful electromagnetic field and a computer (magnetic resonance angiogram or MRA).  How is this treated? Treatment for PVD depends on the cause of your condition and the  severity of your symptoms. It also depends on your age. Underlying causes need to be treated and controlled. These include long-lasting (chronic) conditions, such as diabetes, high cholesterol, and high blood pressure. You may need to first try making lifestyle changes and taking medicines. Surgery may be needed if these do not work. Lifestyle changes may include:  Quitting smoking.  Exercising regularly.  Following a low-fat, low-cholesterol diet.  Medicines may include:  Blood thinners to prevent blood clots.  Medicines to improve blood flow.  Medicines to improve your blood cholesterol levels.  Surgical procedures may include:  A procedure that uses an inflated balloon to open a blocked artery and improve blood flow (angioplasty).  A procedure to put in a tube (stent) to keep a blocked artery open (stent implant).  Surgery to reroute blood flow around a blocked artery (peripheral bypass surgery).  Surgery to remove dead tissue from an infected wound on the affected limb.  Amputation. This is surgical removal of the affected limb. This may be necessary in cases of acute ischemic limb that are not improved through medical or surgical treatments.  Follow these instructions at home:  Take medicines only as directed by your health care provider.  Do not use any tobacco products, including cigarettes, chewing tobacco, or electronic cigarettes. If you need help quitting, ask your health care provider.  Lose weight if you are overweight, and maintain a healthy weight as directed by your health care provider.  Eat a diet that is low in fat and cholesterol. If you need help, ask your health care provider.  Exercise regularly. Ask your health care provider to suggest some good activities for you.  Use compression stockings or other mechanical devices as directed by your health care provider.  Take good care of your feet. ? Wear comfortable shoes that fit well. ? Check your feet  often for any cuts or sores. Contact a health care provider if:  You have cramps in your legs while walking.  You have leg pain when you are at rest.  You have coldness in a leg or foot.  Your skin changes.  You have erectile dysfunction.  You have cuts or sores on your feet that are not healing. Get help right away if:  Your arm or leg turns cold and blue.  Your arms or legs become red, warm, swollen, painful, or numb.  You have chest pain or trouble breathing.  You suddenly have weakness in your face, arm, or leg.  You become very confused or lose the ability to speak.  You suddenly have a very bad headache or lose your vision. This information is not intended to replace advice given to you by your health care provider. Make sure you discuss any questions you have with your health care provider. Document Released: 08/24/2004 Document Revised: 12/23/2015 Document Reviewed: 12/25/2013 [employer] Interactive Patient Education  2017 [employer]  

## 2017-01-26 NOTE — Discharge Summary (Signed)
Physician Discharge Summary  Willie Andrade LHT:[id number] DOB: [birth year] DOA: 01/16/2017  PCP: [name], MD  Admit date: 01/16/2017 Discharge date: 01/26/2017  Time spent: 45 minutes  Recommendations for Outpatient Follow-up:  Patient will be discharged to home.  Patient will need to follow up with primary care provider within one week of discharge.  Follow up with Dr. Arbie Cookey, vascular surgeon, in 2 weeks. Continue hemodialysis as scheduled. Patient should continue medications as prescribed.  Patient should follow a renal diet with fluid restriction per day.   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 Midge Minium  Willie Mitchellis a 66 y.o.malewith history of ESRD on hemodialysis, nonischemic cardiomyopathy status post AICD, anemia who was recently admitted for presyncope presents to the ER because of worsening swelling and pain in the left foot. Patient has been having these symptoms for last 24 hours. Has been a subjective feeling of fever and chills. Denies any trauma or insect bite.  Hospital Course:  Left foot cellulitis/dry gangrene/PVD -Patient was placed on IV vancomycin and Zosyn -Orthopedic surgery consulted and appreciated, patient may need debridement or amputation after vascular intervention -Vascular surgery consulted and appreciated -S/p Arteriogram with bilateral lower extremity runoff on 01/22/17: Aortoiliac irregularity with no flow-limiting stenosis,  patent bilateral superficial femoral artery stents, severe tibial disease bilaterally with complete occlusion of all tibial vessels and reconstruction of the peritoneal artery on the left. On the right, the patient had a disease peroneal that was in line with popliteal artery. -S/p left femoral to peroneal bypass with saphenous vein 01/24/2017 -Patient did have some discharge of noted on incision around knee, which was reinforced with steri-strip -Spoke with vascular, does not need antibiotics at discharge -patient will need follow up with Dr. 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/ Acute blood loss anemia -Hemoglobin curently 10.7, drop in hemoglobin from 6/27 likely secondary to surgery -Upon review of Epic, patient's hemoglobin has ranged from 10-12 -Continue Sensipar, Rena-Vite, Renvela -nephro not recommending ESA or iron  Essential hypertension/hypotension -Not on any medication patient actually uses Midodrine on dialysis days  Chronic systolic heart failure/nonischemic cardiomyopathy -Last Echocardiogram on 12/23/2016 showed EF 15-20% with diffuse hypokinesis -s/p biventricular ICD -Continue volume management with dialysis -Patient currently appears to be euvolemic  Leukocytosis -Resolved, Suspect reactive to recent surgery  Hypotension -Continue midodrine  Consultants Vascular surgery Orthopedic surgery Nephrology  Procedures  Arteriogram  Left femoral-peroneal bypass  Discharge Exam: Vitals:   01/26/17 0402 01/26/17 0900  BP: (!) 90/57 90/65  Pulse: 71 62  Resp: 18 18  Temp: 97.5 F (36.4 C) 97.5 F (36.4 C)   Patient upset that he is not getting gravy with his breakfast. Denies chest pain, shortness of  breath, abdominal pain, nausea, vomiting.    General: Well developed, Ill appearing, no apparent distress  HEENT: NCAT, mucous membranes moist.  Cardiovascular: S1 S2 auscultated RRR,  2/6SEM  Respiratory: Clear to auscultation bilaterally with equal chest rise  Abdomen: Soft, nontender, nondistended, + bowel sounds  Extremities: warm dry without cyanosis clubbing. Mild left foot edmea, graft with pulse, surgical incisions on LLE with mild drainage.   Neuro: AAOx3, nonfocal  Psych: Appropriate  Discharge Instructions Discharge Instructions    Discharge instructions    Complete by:  As directed    Patient will be discharged to home.  Patient will need to follow up with primary care provider within one week of discharge.  Follow up with Dr. Arbie Cookey, vascular surgeon, in 2 weeks. Continue hemodialysis as scheduled. Patient should continue medications as prescribed.  Patient should follow a renal diet with fluid restriction per day.     Current Discharge Medication List    START taking these medications   Details  multivitamin (RENA-VIT) TABS tablet Take 1 tablet by mouth at bedtime. Qty: 30 tablet, Refills: 0    Nutritional Supplements (FEEDING SUPPLEMENT, NEPRO CARB STEADY,) LIQD Take 237 mLs by mouth 2 (two) times daily between meals. Refills: 0    oxyCODONE-acetaminophen (PERCOCET/ROXICET) 5-325 MG tablet Take 1-2 tablets by mouth every 3 (three) hours as needed for moderate pain. Qty: 20 tablet, Refills: 0    sevelamer carbonate (RENVELA) 800 MG tablet Take 4 tablets (3,200 mg total) by mouth 3 (three) times daily with meals. Qty: 360 tablet, Refills: 0      CONTINUE these medications which have CHANGED   Details  cinacalcet (SENSIPAR) 30 MG tablet Take 6 tablets (180 mg total) by mouth every Tuesday, Thursday, and Saturday at 6 PM. Qty: 180 tablet, Refills: 0      CONTINUE these medications which have NOT CHANGED   Details  calcium acetate (PHOSLO) 667 MG  capsule Take 3 capsules (2,001 mg total) by mouth 3 (three) times daily with meals. Qty: 270 capsule, Refills: 2    gabapentin (NEURONTIN) 100 MG capsule Take 100 mg by mouth 2 (two) times daily.     midodrine (PROAMATINE) 5 MG tablet Take 1 tablet (5 mg total) by mouth 2 (two) times daily. Qty: 30 tablet, Refills: 6    lidocaine-prilocaine (EMLA) cream Apply 1 application topically Every Tuesday,Thursday,and Saturday with dialysis.       No Known Allergies Follow-up Information    Early, Kristen Loader, MD Follow up in 2 week(s).   Specialties:  Vascular Surgery, Cardiology Why:  Our office will call you to arrange an appointment  Contact information: [address] [place] [address] [phone]        [name], MD. Schedule an appointment as soon as possible for a visit in 1 week(s).   Specialty:  Family Medicine Why:  Hospital follow up Contact information: [address] [place] [address] [phone]            The results of significant diagnostics from this hospitalization (including imaging, microbiology, ancillary and laboratory) are listed below for reference.    Significant Diagnostic Studies: Dg Toe 3rd Left  Result Date: 01/17/2017 CLINICAL DATA:  Acute onset of left foot swelling and pain. Open sore at the left third toe. Initial encounter. EXAM: LEFT THIRD TOE COMPARISON:  None. FINDINGS: The pattern of air at the distal left fourth toe raises question for infection with a gas producing organism, with overlying soft tissue disruption. No definite osseous erosions are characterized to suggest osteomyelitis, though it cannot be excluded on the basis of radiograph. Soft tissue swelling is noted about the toes.  There is no evidence of fracture or dislocation. No radiopaque foreign bodies are seen. IMPRESSION: 1. Pattern of air at the distal left fourth toe raises question for infection with a gas producing organism, with overlying soft tissue disruption.  Soft tissue swelling noted about the toes. 2. No definite osseous erosion seen to suggest osteomyelitis, though it cannot be excluded on the basis of radiograph. These results were called by telephone at the time of interpretation on 01/17/2017 at 12:16 am to [facility] PA, who verbally acknowledged these results. Electronically Signed   By: Roanna Raider M.D.   On: 01/17/2017 00:16    Microbiology: Recent Results (from the past 240 hour(s))  MRSA PCR Screening     Status: None   Collection Time: 01/18/17  4:50 AM  Result Value Ref Range Status   MRSA by PCR NEGATIVE NEGATIVE Final    Comment:        The GeneXpert MRSA Assay (FDA approved for NASAL specimens only), is one component of a comprehensive MRSA colonization surveillance program. It is not intended to diagnose MRSA infection nor to guide or monitor treatment for MRSA infections.   Surgical PCR screen     Status: None   Collection Time: 01/19/17  5:27 AM  Result Value Ref Range Status   MRSA, PCR NEGATIVE NEGATIVE Final   Staphylococcus aureus NEGATIVE NEGATIVE Final    Comment:        The Xpert SA Assay (FDA approved for NASAL specimens in patients over 38 years of age), is one component of a comprehensive surveillance program.  Test performance has been validated by [facility] for patients greater than or equal to 38 year old. It is not intended to diagnose infection nor to guide or monitor treatment.      Labs: Basic Metabolic Panel:  Recent Labs Lab 01/20/17 0746 01/20/17 1818  01/23/17 0316 01/24/17 0534 01/25/17 0347 01/26/17 0459 01/26/17 1100  NA 135 133*  < > 136 136 137 136 136  K 4.7 3.5  < > 5.0 4.5 4.4 3.9 4.0  CL 93* 92*  < > 95* 96* 97* 95* 95*  CO2 25 28  < > 25 23 25 26 26   GLUCOSE 83 107*  < > 104* 62* 151* 98 96  BUN 24* 11  < > 26* 12 20 14 12   CREATININE 6.76* 4.45*  < > 8.22* 5.23* 6.37* 5.01* 4.98*  CALCIUM 7.6* 7.6*  < > 7.5* 7.4* 7.2* 7.4* 7.6*  PHOS 3.4 2.8  --   --    --   --   --  3.6  < > = values in this interval not displayed. Liver Function Tests:  Recent Labs Lab 01/20/17 0746 01/20/17 1818 01/26/17 1100  ALBUMIN 3.0* 2.8* 3.1*   No results for input(s): LIPASE, AMYLASE in the last 168 hours. No results for input(s): AMMONIA in the last 168 hours. CBC:  Recent Labs Lab 01/22/17 0619 01/23/17 0316 01/24/17 0534 01/25/17 0347 01/26/17 0459  WBC 6.5 8.2 8.6 13.0* 7.5  HGB 13.0 12.3* 12.1* 10.7* 10.7*  HCT 43.3 40.8 40.1 36.6* 37.7*  MCV 92.3 89.5 90.9 91.0 93.5  PLT 130* 179 144* 171 139*   Cardiac Enzymes: No results for input(s): CKTOTAL, CKMB, CKMBINDEX, TROPONINI in the last 168 hours. BNP: BNP (last 3 results) No results for input(s): BNP in the last 8760 hours.  ProBNP (last 3 results) No results for input(s): PROBNP in the last 8760 hours.  CBG: No results for  input(s): GLUCAP in the last 168 hours.     SignedEdsel Petrin  [facility] 01/26/2017, 3:00 PM

## 2017-01-26 NOTE — Care Management Note (Signed)
Case Management Note  Patient Details  Name: Willie Andrade MRN: [id number] Date of Birth: Oct 11, 1950  Subjective/Objective:         Patient from home w sister. DC to home today. No HH needs or other CM needs identified at time of DC.            Action/Plan:  DC to home.  Expected Discharge Date:  01/26/17               Expected Discharge Plan:  Home/Self Care  In-House Referral:     Discharge planning Services  CM Consult  Post Acute Care Choice:    Choice offered to:     DME Arranged:    DME Agency:     HH Arranged:    HH Agency:     Status of Service:  Completed, signed off  If discussed at [employer] of Stay Meetings, dates discussed:    Additional Comments:  Lawerance Sabal, RN 01/26/2017, 3:38 PM

## 2017-01-26 NOTE — Progress Notes (Signed)
Patient received in unit via bed. Vital signs are stable. Patient is alert oriented x 4. Patient refused to skin assessment. Patient given instruction about call light, phone and unit routine.

## 2017-01-26 NOTE — Progress Notes (Signed)
[facility] Progress Note   Subjective: on HD , c/o postop leg pain. BP's low in the 80's.   Objective Vitals:   01/25/17 1648 01/25/17 2043 01/26/17 0402 01/26/17 0428  BP: (!) 87/56 (!) 88/54 (!) 90/57   Pulse: 100 70 71   Resp: 18 20 18    Temp: 98.2 F (36.8 C) 98.4 F (36.9 C) 97.5 F (36.4 C)   TempSrc: Oral Oral Oral   SpO2: 95% 100% 100%   Weight:  69.4 kg (153 lb)  69.4 kg (153 lb)  Height:       Physical Exam General: no distress, on HD Heart: S1,S2, 2/6 systolic M. SR on monitor Lungs: CTAB, no WOB, respirations shallow.  Abdomen: soft, hypoactive BS Extremities: No LE edema. Incision with derma-bond L inner leg. Dry gangrene L. 2nd, 3rd, 4th toes. Feet cold, no palpable pulses.  Dialysis Access: LFA AVF + bruit   Additional Objective Labs: Basic Metabolic Panel:  Recent Labs Lab 01/20/17 0746 01/20/17 1818  01/24/17 0534 01/25/17 0347 01/26/17 0459  NA 135 133*  < > 136 137 136  K 4.7 3.5  < > 4.5 4.4 3.9  CL 93* 92*  < > 96* 97* 95*  CO2 25 28  < > 23 25 26   GLUCOSE 83 107*  < > 62* 151* 98  BUN 24* 11  < > 12 20 14   CREATININE 6.76* 4.45*  < > 5.23* 6.37* 5.01*  CALCIUM 7.6* 7.6*  < > 7.4* 7.2* 7.4*  PHOS 3.4 2.8  --   --   --   --   < > = values in this interval not displayed. Liver Function Tests:  Recent Labs Lab 01/20/17 0746 01/20/17 1818  ALBUMIN 3.0* 2.8*   No results for input(s): LIPASE, AMYLASE in the last 168 hours. CBC:  Recent Labs Lab 01/22/17 0619 01/23/17 0316 01/24/17 0534 01/25/17 0347 01/26/17 0459  WBC 6.5 8.2 8.6 13.0* 7.5  HGB 13.0 12.3* 12.1* 10.7* 10.7*  HCT 43.3 40.8 40.1 36.6* 37.7*  MCV 92.3 89.5 90.9 91.0 93.5  PLT 130* 179 144* 171 139*   Blood Culture    Component Value Date/Time   SDES BLOOD RIGHT ANTECUBITAL 12/21/2016 2041   SPECREQUEST  12/21/2016 2041    BOTTLES DRAWN AEROBIC AND ANAEROBIC Blood Culture adequate volume   CULT NO GROWTH 5 DAYS 12/21/2016 2041   REPTSTATUS  12/26/2016 FINAL 12/21/2016 2041    Cardiac Enzymes: No results for input(s): CKTOTAL, CKMB, CKMBINDEX, TROPONINI in the last 168 hours. CBG: No results for input(s): GLUCAP in the last 168 hours. Iron Studies: No results for input(s): IRON, TIBC, TRANSFERRIN, FERRITIN in the last 72 hours. @lablastinr3 @ Studies/Results: No results found. Medications: . sodium chloride    . sodium chloride    . sodium chloride    . piperacillin-tazobactam (ZOSYN)  IV Stopped (01/26/17 0422)  . vancomycin Stopped (01/25/17 1156)   . calcitRIOL  1 mcg Oral Q T,Th,Sa-HD  . cinacalcet  180 mg Oral Q T,Th,Sat-1800  . docusate sodium  100 mg Oral Daily  . feeding supplement (NEPRO CARB STEADY)  237 mL Oral BID BM  . gabapentin  100 mg Oral BID  . heparin subcutaneous  5,000 Units Subcutaneous Q8H  .  HYDROmorphone (DILAUDID) injection  1 mg Intravenous Once in dialysis  . midodrine  5 mg Oral BID WC  . multivitamin  1 tablet Oral QHS  . sevelamer carbonate  3,200 mg Oral TID WC   D[facility]:  [facility]  68kg  2/2 bath  400/800  Hep 1400   LFA AVF -Sensipar 180 mg PO TIW (last PTH 1527 12/26/16) -Calcitriol 1 mcg PO TIW -Venofer 50 mg IV weekly (last dose 01/11/17 Last Fe 40 Tsat 22% 12/26/16) -Mircera 75 mcg IV q w 2 weeks (Last HGB 10.7 01/11/17)     Assess/Plan: 1. 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  2. ESRD: cont TTS HD. HD today.  3.BP/volume: Usually hypotensive, on midodrine 5mg  BID. No edema. Close to dry wt.  4. Anemia: Hgb 13 No ESA or iron for now. 5. MBD of CKD: Cont binders, sensipar VDRA. Ca 7.4 C Ca 8.36 6. Nutrition: cont same  7. Systolic heart failure (LVEF 15-20%) with ICD: per primary.   Vinson Moselle MD [employer] pager [phone]   01/25/2017, 9:16 AM

## 2017-01-26 NOTE — ED Provider Notes (Signed)
[facility]-EMERGENCY DEPT Provider Note   CSN: [id number] Arrival date & time: 01/26/17  1637     History   Chief Complaint Chief Complaint  Patient presents with  . Fall  . Leg Pain  . Hypotension    HPI Willie Andrade is a 66 y.o. male.  The history is provided by the patient. No language interpreter was used.  Fall   Leg Pain      Willie Andrade is a 66 y.o. male who presents to the Emergency Department complaining of fall.  He was discharged from the hospital earlier today following hospitalization for dry gangrene of the left lower extremity as well as femoropopliteal bypass. He was walking to the parking lot when he became weak and fell to the ground, landing on his bottom. He denies any pain in his head, back, legs. Symptoms are mild to moderate and constant nature.  Past Medical History:  Diagnosis Date  . Chronic systolic CHF (congestive heart failure), NYHA class 2 (HCC)   . ESRD (end stage renal disease) on dialysis (HCC)   . Hepatitis B   . HTN (hypertension)   . Hyperlipidemia   . 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 Tobacco abuse     Patient Active Problem List   Diagnosis Date Noted  . Cellulitis of left foot 01/17/2017  . Cellulitis 01/17/2017  . Gangrene of toe of left foot (HCC)   . Pre-syncope 12/21/2016  . Chronic systolic heart failure (HCC) 10/11/2016  . 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 Pacemaker 10/10/2016  . ESRD (end stage renal disease) (HCC) 02/14/2016    Past Surgical History:  Procedure Laterality Date  . ABDOMINAL AORTOGRAM W/LOWER EXTREMITY N/A 01/22/2017   Procedure: Abdominal Aortogram w/Lower Extremity;  Surgeon: [name], MD;  Location: [facility]  INVASIVE CV LAB;  Service: Cardiovascular;  Laterality: N/A;  . BIV ICD GENERATOR CHANGEOUT N/A 11/28/2016   Procedure: BiV ICD [employer]; St Jude Surgeon: Will Jorja Loa, MD;  Location: [facility] INVASIVE CV LAB;  Service: Cardiovascular;  Laterality: N/A;  . BYPASS GRAFT FEMORAL-PERONEAL Left 01/24/2017   Procedure: Left Leg  FEMORAL-PERONEAL Bypass Graft;  Surgeon: [name], MD;  Location: [facility] OR;  Service: Vascular;  Laterality: Left;       Home Medications    Prior to Admission medications   Medication Sig Start Date End Date Taking? Authorizing Provider  calcium acetate (PHOSLO) 667 MG capsule Take 3 capsules (2,001 mg total) by mouth 3 (three) times daily with meals. 12/23/16   Clydia Llano, MD  cinacalcet (SENSIPAR) 30 MG tablet Take 6 tablets (180 mg total) by mouth every Tuesday, Thursday, and Saturday at 6 PM. 01/27/17   Edsel Petrin, DO  gabapentin (NEURONTIN) 100 MG capsule Take 100 mg by mouth 2 (two) times daily.     [provider]  lidocaine-prilocaine (EMLA) cream Apply 1 application topically Every Tuesday,Thursday,and Saturday with dialysis.    [provider]  midodrine (PROAMATINE) 5 MG tablet Take 1 tablet (5 mg total) by mouth 2 (two) times daily. 12/23/16   Clydia Llano, MD  multivitamin (RENA-VIT) TABS tablet Take 1 tablet by mouth at bedtime. 01/26/17   Edsel Petrin, DO  Nutritional Supplements (FEEDING SUPPLEMENT, NEPRO CARB STEADY,)  LIQD Take 237 mLs by mouth 2 (two) times daily between meals. 01/27/17   Edsel Petrin, DO  oxyCODONE-acetaminophen (PERCOCET/ROXICET) 5-325 MG tablet Take 1-2 tablets by mouth every 3 (three) hours as needed for moderate pain. 01/26/17   Mikhail, Nita Sells, DO  sevelamer carbonate (RENVELA) 800 MG tablet Take 4 tablets (3,200 mg total) by mouth 3 (three) times daily with meals. 01/26/17   Edsel Petrin, DO    Family History Family History  Problem Relation Age of Onset  . Diabetes Mother   . Gout  Mother   . Stroke Father   . Gout Maternal Grandmother   . Gout Sister   . Asthma Brother     Social History Social History  Substance Use Topics  . Smoking status: Current Every Day Smoker    Packs/day: 0.00    Types: Cigarettes  . Smokeless tobacco: Never Used  . Alcohol use No     Allergies   Patient has no known allergies.   Review of Systems Review of Systems  All other systems reviewed and are negative.    Physical Exam Updated Vital Signs BP 102/63 (BP Location: Right Arm)   Pulse 82   Temp 97.4 F (36.3 C) (Oral)   Resp 20   SpO2 99%   Physical Exam  Constitutional: He is oriented to person, place, and time. He appears well-developed and well-nourished.  Chronically ill-appearing  HENT:  Head: Normocephalic and atraumatic.  Cardiovascular: Normal rate and regular rhythm.   No murmur heard. Pulmonary/Chest: Effort normal and breath sounds normal. No respiratory distress.  Abdominal: Soft. There is no tenderness. There is no rebound and no guarding.  Musculoskeletal:  No tenderness to bilateral lower extremities. 1+ DP pulses bilaterally. There is mild erythema without any induration to the left inner thigh. Surgical wound to the left medial aspect of the thigh and leg with partial dehiscence of the thigh wound. Scant amount of serous exudate. There is darkening of the digits of the left foot consistent with dry gangrene.  Neurological: He is alert and oriented to person, place, and time.  Skin: Skin is warm and dry.  Psychiatric: He has a normal mood and affect. His behavior is normal.  Nursing note and vitals reviewed.    ED Treatments / Results  Labs (all labs ordered are listed, but only abnormal results are displayed) Labs Reviewed - No data to display  EKG  EKG Interpretation None       Radiology No results found.  Procedures Procedures (including critical care time)  Medications Ordered in ED Medications - No data to  display   Initial Impression / Assessment and Plan / ED Course  I have reviewed the triage vital signs and the nursing notes.  Pertinent labs & imaging results that were available during my care of the patient were reviewed by me and considered in my medical decision making (see chart for details).   patient here for evaluation of injuries following a fall. He does have orthostatic hypotension and an steady gait. Wife is very uncomfortable taking him home given orthostasis and ataxia. Discussed with hospitalist regarding holding until placement/stiffness may be available.  Final Clinical Impressions(s) / ED Diagnoses   Final diagnoses:  None    New Prescriptions New Prescriptions   No medications on file     Tilden Fossa, MD 01/26/17 2330

## 2017-01-26 NOTE — Progress Notes (Signed)
Ankle Brachial Index  Lower  Extremity Right Left  Dorsalis Pedis >254, dampened mono >254, dampened mono  Posterior Tibial 19, dampened mono 134, dampened mono  Ankle/Brachial Indices 0.24 1.7   Impression: Difficult exam due to diminished waveforms and poor Doppler signals. Bilateral dorsal pedis could not be occluded. Left brachial pressure could not be obtained-restricted extremity.  Right ABI of 0.24 indicates critical PAD.  Left ABI of 1.7 is likely falsely elevated and likely inaccurate.  Attempt was made to evaluate waveforms of bilateral great toes, dampened signals, difficult to evaluate.  Levin Bacon- RDMS, RVT 1:56 PM  01/26/2017

## 2017-01-26 NOTE — ED Notes (Signed)
ED Provider at bedside. 

## 2017-01-27 DIAGNOSIS — I5022 Chronic systolic (congestive) heart failure: Secondary | ICD-10-CM | POA: Diagnosis not present

## 2017-01-27 DIAGNOSIS — I951 Orthostatic hypotension: Secondary | ICD-10-CM

## 2017-01-27 DIAGNOSIS — R531 Weakness: Secondary | ICD-10-CM | POA: Diagnosis not present

## 2017-01-27 DIAGNOSIS — N186 End stage renal disease: Secondary | ICD-10-CM | POA: Diagnosis not present

## 2017-01-27 LAB — CBC
HEMATOCRIT: 38.8 % — AB (ref 39.0–52.0)
Hemoglobin: 11.4 g/dL — ABNORMAL LOW (ref 13.0–17.0)
MCH: 27.1 pg (ref 26.0–34.0)
MCHC: 29.4 g/dL — AB (ref 30.0–36.0)
MCV: 92.2 fL (ref 78.0–100.0)
Platelets: 123 10*3/uL — ABNORMAL LOW (ref 150–400)
RBC: 4.21 MIL/uL — ABNORMAL LOW (ref 4.22–5.81)
RDW: 18.1 % — ABNORMAL HIGH (ref 11.5–15.5)
WBC: 7.3 10*3/uL (ref 4.0–10.5)

## 2017-01-27 LAB — BASIC METABOLIC PANEL
Anion gap: 13 (ref 5–15)
BUN: 19 mg/dL (ref 6–20)
CHLORIDE: 94 mmol/L — AB (ref 101–111)
CO2: 28 mmol/L (ref 22–32)
CREATININE: 6.25 mg/dL — AB (ref 0.61–1.24)
Calcium: 7.3 mg/dL — ABNORMAL LOW (ref 8.9–10.3)
GFR calc Af Amer: 10 mL/min — ABNORMAL LOW (ref >60)
GFR calc non Af Amer: 8 mL/min — ABNORMAL LOW (ref >60)
Glucose, Bld: 89 mg/dL (ref 65–99)
Potassium: 4.2 mmol/L (ref 3.5–5.1)
Sodium: 135 mmol/L (ref 135–145)

## 2017-01-27 MED ORDER — OXYCODONE-ACETAMINOPHEN 5-325 MG PO TABS
ORAL_TABLET | ORAL | Status: AC
  Administered 2017-01-27: 2 via ORAL
  Filled 2017-01-27: qty 2

## 2017-01-27 MED ORDER — MIDODRINE HCL 5 MG PO TABS
10.0000 mg | ORAL_TABLET | Freq: Three times a day (TID) | ORAL | Status: DC
  Administered 2017-01-27 – 2017-01-28 (×3): 10 mg via ORAL
  Filled 2017-01-27 (×2): qty 2

## 2017-01-27 MED ORDER — ACETAMINOPHEN 325 MG PO TABS
ORAL_TABLET | ORAL | Status: AC
  Filled 2017-01-27: qty 2

## 2017-01-27 NOTE — Progress Notes (Signed)
Patient arrived to unit per bed.  Reviewed treatment plan and this RN agrees.  Report received from bedside RN, Lauren.  Consent verified.  Patient A & o X 4. Lung sounds diminished to ausculation in all fields. No edema. Cardiac: NSR.  Prepped LLAVF with alcohol and cannulated with two 15 gauge needles.  Pulsation of blood noted.  Flushed access well with saline per protocol.  Connected and secured lines and initiated tx at 1505.  UF goal of 500 mL and net fluid removal of 0 mL.  Will continue to monitor.

## 2017-01-27 NOTE — Progress Notes (Signed)
[facility] PROGRESS NOTE  Willie Andrade ZOX:[id number] DOB: 1950/10/03 DOA: 01/26/2017  PCP: [name], MD  Brief History/Interval Summary: 66 year old African-American male with a past medical history of end-stage renal disease on hemodialysis, chronic systolic CHF with ICD, chronic hypotension on midodrine, who was recently hospitalized and underwent left femoral peroneal bypass and was discharged on 6/29. When he reached the parking lot he felt weak, dizzy and collapsed to the floor, however, did not have any syncopal episode. Did not have any head injuries. He was brought back into the emergency department and was rehospitalized.  Reason for Visit: Orthostatic hypotension  Consultants: Nephrology  Procedures: Dialysis to be done today  Antibiotics: None  Subjective/Interval History: Patient is better this morning. Denies any dizziness or lightheadedness. No chest pain or shortness of breath.  ROS: Denies any nausea or vomiting  Objective:  Vital Signs  Vitals:   01/26/17 2045 01/27/17 0538 01/27/17 1050 01/27/17 1051  BP: 102/61 101/84  111/67  Pulse: 94 72  96  Resp: 18 18    Temp: 97.5 F (36.4 C) 98 F (36.7 C)    TempSrc: Oral Oral    SpO2: (!) 69% 94%  91%  Weight:   72.4 kg (159 lb 9.8 oz)     Intake/Output Summary (Last 24 hours) at 01/27/17 1124 Last data filed at 01/27/17 0900  Gross per 24 hour  Intake              290 ml  Output                0 ml  Net              290 ml   Filed Weights   01/27/17 1050  Weight: 72.4 kg (159 lb 9.8 oz)    General appearance: alert, cooperative, appears stated age and no distress Resp: clear to auscultation bilaterally Cardio: regular rate and rhythm, S1, S2 normal, no murmur, click, rub or gallop GI: soft, non-tender; bowel sounds normal; no masses,  no organomegaly Extremities: Dressing noted on the operative site in the lower extremities. Left foot is warm to touch. Some evidence for dry gangrene  involving toes. No erythema. No active drainage is noted. Neurologic: No focal deficits  Lab Results:  Data Reviewed: I have personally reviewed following labs and imaging studies  CBC:  Recent Labs Lab 01/23/17 0316 01/24/17 0534 01/25/17 0347 01/26/17 0459 01/27/17 0330  WBC 8.2 8.6 13.0* 7.5 7.3  HGB 12.3* 12.1* 10.7* 10.7* 11.4*  HCT 40.8 40.1 36.6* 37.7* 38.8*  MCV 89.5 90.9 91.0 93.5 92.2  PLT 179 144* 171 139* 123*    Basic Metabolic Panel:  Recent Labs Lab 01/20/17 1818  01/24/17 0534 01/25/17 0347 01/26/17 0459 01/26/17 1100 01/27/17 0330  NA 133*  < > 136 137 136 136 135  K 3.5  < > 4.5 4.4 3.9 4.0 4.2  CL 92*  < > 96* 97* 95* 95* 94*  CO2 28  < > 23 25 26 26 28   GLUCOSE 107*  < > 62* 151* 98 96 89  BUN 11  < > 12 20 14 12 19   CREATININE 4.45*  < > 5.23* 6.37* 5.01* 4.98* 6.25*  CALCIUM 7.6*  < > 7.4* 7.2* 7.4* 7.6* 7.3*  PHOS 2.8  --   --   --   --  3.6  --   < > = values in this interval not displayed.  GFR: Estimated Creatinine Clearance: 11.9 mL/min (A) (  by C-G formula based on SCr of 6.25 mg/dL (H)).  Liver Function Tests:  Recent Labs Lab 01/20/17 1818 01/26/17 1100  ALBUMIN 2.8* 3.1*    Coagulation Profile:  Recent Labs Lab 01/22/17 0619 01/24/17 1845  INR 1.33 1.28     Recent Results (from the past 240 hour(s))  MRSA PCR Screening     Status: None   Collection Time: 01/18/17  4:50 AM  Result Value Ref Range Status   MRSA by PCR NEGATIVE NEGATIVE Final    Comment:        The GeneXpert MRSA Assay (FDA approved for NASAL specimens only), is one component of a comprehensive MRSA colonization surveillance program. It is not intended to diagnose MRSA infection nor to guide or monitor treatment for MRSA infections.   Surgical PCR screen     Status: None   Collection Time: 01/19/17  5:27 AM  Result Value Ref Range Status   MRSA, PCR NEGATIVE NEGATIVE Final   Staphylococcus aureus NEGATIVE NEGATIVE Final    Comment:         The Xpert SA Assay (FDA approved for NASAL specimens in patients over 13 years of age), is one component of a comprehensive surveillance program.  Test performance has been validated by [facility] for patients greater than or equal to 65 year old. It is not intended to diagnose infection nor to guide or monitor treatment.       Radiology Studies: No results found.   Medications:  Scheduled: . calcium acetate  2,001 mg Oral TID WC  . cinacalcet  180 mg Oral Q T,Th,Sat-1800  . feeding supplement (NEPRO CARB STEADY)  237 mL Oral BID BM  . gabapentin  100 mg Oral BID  . heparin  5,000 Units Subcutaneous Q8H  . midodrine  10 mg Oral TID WC  . midodrine  5 mg Oral Once  . multivitamin  1 tablet Oral QHS  . 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 Patient has chronically low blood pressures. He is noted to be in midodrine. His low blood pressure could've contributed to his symptoms and presentation. Patient was seen by PT and OT yesterday prior to discharge and they did not feel like he had any needs for therapy. They have been reconsulted. Discussed with nephrology. Dose of midodrine increased. Nephrology will also look into his one in status.  ESRD on HD  Patient was dialyzed on 6/28. He is on Tuesday, Thursday, Saturday schedule. Nephrology notified that he may need dialysis today. They will also look into his volume status and  EDW.  Peripheral arterial disease   Treated during recent admission with left femoral-peroneal bypass graft. Some serious drainage from his wounds, but no evidence for active infection.  Wound dehiscence  Surgical wound in medial left thigh with some separation, steri-strips falling off, serous weeping. Cleaned and steri-strips replaced in ED. Continue wound-care     Chronic systolic CHF status post ICD TTE (12/23/16) with EF 15-20%. Seems reasonably well compensated at this time.  Hypotension  See discussion above. Midodrine dose has been increased after discussion with nephrology.  DVT Prophylaxis: Subcutaneous heparin    Code Status: Full code  Family Communication: Discussed with the patient  Disposition Plan: Await dialysis. Await PT evaluation.    LOS: 0 days   [facility]  [facility] Pager [phone] 01/27/2017, 11:24 AM  If 7PM-7AM, please contact night-coverage at [email or web address], password [facility]

## 2017-01-27 NOTE — Progress Notes (Signed)
Dialysis treatment completed.  500 mL ultrafiltrated and net fluid removal 0 mL.    Patient status unchanged. Lung sounds diminished to ausculation in all fields. No edema. Cardiac: NSR.  Disconnected lines and removed needles.  Pressure held for 10 minutes and band aid/gauze dressing applied.  Report given to bedside RN, Lauren.

## 2017-01-27 NOTE — Progress Notes (Signed)
[facility] Progress Note   Subjective: "Sure  I can stand up, I'm ready to get out of here."  Standing BP 116/ 75, HR 102, standing wt 72.4kg . No swelling or SOB/ cough.   Objective Vitals:   01/26/17 2045 01/27/17 0538 01/27/17 1050 01/27/17 1051  BP: 102/61 101/84  111/67  Pulse: 94 72  96  Resp: 18 18    Temp: 97.5 F (36.4 C) 98 F (36.7 C)    TempSrc: Oral Oral    SpO2: (!) 69% 94%  91%  Weight:   72.4 kg (159 lb 9.8 oz)    Physical Exam General: thin elderly male in NAD Heart: S1,S2 2/6 systolic M Lungs: CTAB, no WOB Abdomen: active BS Extremities: L leg incision with steri strips upper portion near knee,very sm area dehiscence noted and 1 steri strip above knee. No LE or UE edema.  Dialysis Access: LFA AVF + bruit  Dialysis: [facility]  68kg  2/2 bath  400/800  Hep 1400   LFA AVF -Sensipar 180 mg PO TIW (last PTH 1527 12/26/16) -Calcitriol 1 mcg PO TIW -Venofer 50 mg IV weekly (last dose 01/11/17 Last Fe 40 Tsat 22% 12/26/16) -Mircera 75 mcg IV q w 2 weeks (Last HGB 10.7 01/11/17)   Assess/Plan: 1. 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 Pain control per primary.  2. ESRD: cont TTS HD. HD today 3. Hypotension, chronic - on midodrine 10 tid.  4. Volume - has gained body wt, euvolemic on exam and BP's standing are normal. Will raise dry wt to 72kg (from 68kg). No UF with HD today.  4. Anemia: Hgb 10.7 down from 12.3 pre op.  No ESA or iron for now. 5. MBD of CKD: Cont binders, sensipar VDRA. Ca 7.4 C Ca 8.36 6. Nutrition: Renal diet/renal vit/nepro 7. Systolic heart failure (LVEF 15-20%) with ICD: per primary.    Vinson Moselle MD [facility] pager [phone]    Additional Objective Labs: Basic Metabolic Panel:  Recent Labs Lab 01/20/17 1818  01/26/17 0459 01/26/17 1100 01/27/17 0330  NA 133*  < > 136 136 135  K 3.5  < > 3.9 4.0 4.2  CL 92*  < > 95* 95* 94*  CO2  28  < > 26 26 28   GLUCOSE 107*  < > 98 96 89  BUN 11  < > 14 12 19   CREATININE 4.45*  < > 5.01* 4.98* 6.25*  CALCIUM 7.6*  < > 7.4* 7.6* 7.3*  PHOS 2.8  --   --  3.6  --   < > = values in this interval not displayed. Liver Function Tests:  Recent Labs Lab 01/20/17 1818 01/26/17 1100  ALBUMIN 2.8* 3.1*   No results for input(s): LIPASE, AMYLASE in the last 168 hours. CBC:  Recent Labs Lab 01/23/17 0316 01/24/17 0534 01/25/17 0347 01/26/17 0459 01/27/17 0330  WBC 8.2 8.6 13.0* 7.5 7.3  HGB 12.3* 12.1* 10.7* 10.7* 11.4*  HCT 40.8 40.1 36.6* 37.7* 38.8*  MCV 89.5 90.9 91.0 93.5 92.2  PLT 179 144* 171 139* 123*   Blood Culture    Component Value Date/Time   SDES BLOOD RIGHT ANTECUBITAL 12/21/2016 2041   SPECREQUEST  12/21/2016 2041    BOTTLES DRAWN AEROBIC AND ANAEROBIC Blood Culture adequate volume   CULT NO GROWTH 5 DAYS 12/21/2016 2041   REPTSTATUS 12/26/2016 FINAL 12/21/2016 2041    Cardiac Enzymes: No results for input(s):  CKTOTAL, CKMB, CKMBINDEX, TROPONINI in the last 168 hours. CBG: No results for input(s): GLUCAP in the last 168 hours. Iron Studies: No results for input(s): IRON, TIBC, TRANSFERRIN, FERRITIN in the last 72 hours. @lablastinr3 @ Studies/Results: No results found. Medications:  . calcium acetate  2,001 mg Oral TID WC  . cinacalcet  180 mg Oral Q T,Th,Sat-1800  . feeding supplement (NEPRO CARB STEADY)  237 mL Oral BID BM  . gabapentin  100 mg Oral BID  . heparin  5,000 Units Subcutaneous Q8H  . midodrine  10 mg Oral TID WC  . midodrine  5 mg Oral Once  . multivitamin  1 tablet Oral QHS  . sevelamer carbonate  3,200 mg Oral TID WC     01/27/2017, 11:30 AM

## 2017-01-27 NOTE — Evaluation (Signed)
Physical Therapy Evaluation Patient Details Name: Willie Andrade MRN: [id number] DOB: 01-24-1951 Today's Date: 01/27/2017   History of Present Illness  66 yo male with onset of L LE fem-peroneal bypass graft 01/24/17, discharged home on 6/29 and fell in the parking lot without passing out.  Returned to hosp uninjured, thought to be from hypotension.  PMHx:  L foot gangrene, PAD, L thigh incision, ESRD, HD, CHF, ICD, Hep B  Clinical Impression  Pt is up to walk with PT today, noting his changes of gait without outright LOB.  He is somewhat agitated with PT then calmer, talked about his concerns about family support.  He is appropriate to follow acutely and recommend HHPT to follow up with his balance and ability to navigate at home safely.  He is having low BP readings and would benefit from being assessed at home for the changes of BP.  Follow acutely for strengthening and monitoring of tolerance of gait in hospital, ck BP's.    Follow Up Recommendations Home health PT;Supervision - Intermittent    Equipment Recommendations  None recommended by PT    Recommendations for Other Services       Precautions / Restrictions Precautions Precautions: Fall Restrictions Weight Bearing Restrictions: No  Monitor for low BP.     Mobility  Bed Mobility Overal bed mobility: Modified Independent                Transfers Overall transfer level: Modified independent Equipment used: None             General transfer comment: mild unsteadiness, but is able to widen his base and maintain his stability  Ambulation/Gait Ambulation/Gait assistance: Min guard Ambulation Distance (Feet): 150 Feet Assistive device: None Gait Pattern/deviations: Decreased stride length;Wide base of support;Trunk flexed;Decreased weight shift to left Gait velocity: variable Gait velocity interpretation: Below normal speed for age/gender General Gait Details: pt is impulsive on the hall, and finally slowed  down to a more normal pace for his condition with no LOB but some stepping laterally at times  Stairs            Wheelchair Mobility    Modified Rankin (Stroke Patients Only)       Balance Overall balance assessment: Needs assistance Sitting-balance support: Feet supported;No upper extremity supported Sitting balance-Leahy Scale: Good     Standing balance support: No upper extremity supported Standing balance-Leahy Scale: Good                               Pertinent Vitals/Pain Pain Assessment: 0-10 Pain Score: 8  Pain Location: LLE with standing Pain Descriptors / Indicators: Aching;Sore Pain Intervention(s): Limited activity within patient's tolerance;Monitored during session;Premedicated before session;Repositioned    Home Living Family/patient expects to be discharged to:: Private residence Living Arrangements: Other relatives Available Help at Discharge: Family;Available PRN/intermittently Type of Home: House Home Access: Stairs to enter Entrance Stairs-Rails: Right;Left;Can reach both Entrance Stairs-Number of Steps: 3 Home Layout: One level Home Equipment: None      Prior Function Level of Independence: Independent         Comments: takes SCAT to HD     Hand Dominance        Extremity/Trunk Assessment   Upper Extremity Assessment Upper Extremity Assessment: Overall WFL for tasks assessed    Lower Extremity Assessment Lower Extremity Assessment: Generalized weakness    Cervical / Trunk Assessment Cervical / Trunk Assessment: Normal  Communication  Communication: No difficulties  Cognition Arousal/Alertness: Awake/alert Behavior During Therapy: Agitated Overall Cognitive Status: Within Functional Limits for tasks assessed                                 General Comments: pt was agitated with PT stating he did not see how PT could keep him from falling despite wearing a gait belt and maintaining contact with  the pt      General Comments General comments (skin integrity, edema, etc.): Pt is feeling like his family let him down due to the wrong person coming to get him, although he admits his sister could not assist him    Exercises     Assessment/Plan    PT Assessment Patient needs continued PT services  PT Problem List Decreased activity tolerance;Decreased mobility;Decreased balance;Decreased knowledge of use of DME;Decreased safety awareness;Decreased knowledge of precautions;Pain       PT Treatment Interventions DME instruction;Gait training;Stair training;Functional mobility training;Therapeutic activities;Therapeutic exercise;Patient/family education    PT Goals (Current goals can be found in the Care Plan section)  Acute Rehab PT Goals Patient Stated Goal: to go home PT Goal Formulation: With patient Time For Goal Achievement: 02/03/17 Potential to Achieve Goals: Good    Frequency Min 3X/week   Barriers to discharge Inaccessible home environment has steps to enter house    Co-evaluation               AM-PAC PT "6 Clicks" Daily Activity  Outcome Measure Difficulty turning over in bed (including adjusting bedclothes, sheets and blankets)?: None Difficulty moving from lying on back to sitting on the side of the bed? : None Difficulty sitting down on and standing up from a chair with arms (e.g., wheelchair, bedside commode, etc,.)?: None Help needed moving to and from a bed to chair (including a wheelchair)?: A Little Help needed walking in hospital room?: A Little Help needed climbing 3-5 steps with a railing? : A Little 6 Click Score: 21    End of Session Equipment Utilized During Treatment: Gait belt Activity Tolerance: Patient tolerated treatment well Patient left: in bed;with call bell/phone within reach (refused the chair) Nurse Communication: Mobility status;Other (comment) (pt awaiting pain meds) PT Visit Diagnosis: Unsteadiness on feet (R26.81);Muscle  weakness (generalized) (M62.81) Pain - Right/Left: Left Pain - part of body: Leg    Time: 7290-2111 PT Time Calculation (min) (ACUTE ONLY): 24 min   Charges:   PT Evaluation $PT Eval Moderate Complexity: 1 Procedure PT Treatments $Gait Training: 8-22 mins   PT G Codes:   PT G-Codes **NOT FOR INPATIENT CLASS** Functional Assessment Tool Used: AM-PAC 6 Clicks Basic Mobility Functional Limitation: Mobility: Walking and moving around Mobility: Walking and Moving Around Current Status (B5208): At least 20 percent but less than 40 percent impaired, limited or restricted Mobility: Walking and Moving Around Goal Status [phone]): At least 1 percent but less than 20 percent impaired, limited or restricted    Ivar Drape 01/27/2017, 1:14 PM   Samul Dada, PT MS Acute Rehab Dept. Number: [facility] [id number] and [facility] [phone]

## 2017-01-28 DIAGNOSIS — I951 Orthostatic hypotension: Secondary | ICD-10-CM | POA: Diagnosis not present

## 2017-01-28 MED ORDER — POLYETHYLENE GLYCOL 3350 17 G PO PACK: 17.0000 g | PACK | Freq: Every day | ORAL | 0 refills | Status: AC | PRN

## 2017-01-28 MED ORDER — POTASSIUM CHLORIDE CRYS ER 20 MEQ PO TBCR: 40.0000 meq | EXTENDED_RELEASE_TABLET | Freq: Once | ORAL | Status: DC

## 2017-01-28 MED ORDER — MIDODRINE HCL 5 MG PO TABS: 10.0000 mg | ORAL_TABLET | Freq: Three times a day (TID) | ORAL | 6 refills | Status: AC

## 2017-01-28 NOTE — Discharge Instructions (Signed)
Orthostatic Hypotension Orthostatic hypotension is a sudden drop in blood pressure that happens when you quickly change positions, such as when you get up from a seated or lying position. Blood pressure is a measurement of how strongly, or weakly, your blood is pressing against the walls of your arteries. Arteries are blood vessels that carry blood from your heart throughout your body. When blood pressure is too low, you may not get enough blood to your brain or to the rest of your organs. This can cause weakness, light-headedness, rapid heartbeat, and fainting. This can last for just a few seconds or for up to a few minutes. Orthostatic hypotension is usually not a serious problem. However, if it happens frequently or gets worse, it may be a sign of something more serious. What are the causes? This condition may be caused by:  Sudden changes in posture, such as standing up quickly after you have been sitting or lying down.  Blood loss.  Loss of body fluids (dehydration).  Heart problems.  Hormone (endocrine) problems.  Pregnancy.  Severe infection.  Lack of certain nutrients.  Severe allergic reactions (anaphylaxis).  Certain medicines, such as blood pressure medicine or medicines that make the body lose excess fluids (diuretics). Sometimes, this condition can be caused by not taking medicine as directed, such as taking too much of a certain medicine.  What increases the risk? Certain factors can make you more likely to develop orthostatic hypotension, including:  Age. Risk increases as you get older.  Conditions that affect the heart or the central nervous system.  Taking certain medicines, such as blood pressure medicine or diuretics.  Being pregnant.  What are the signs or symptoms? Symptoms of this condition may include:  Weakness.  Light-headedness.  Dizziness.  Blurred vision.  Fatigue.  Rapid heartbeat.  Fainting, in severe cases.  How is this  diagnosed? This condition is diagnosed based on:  Your medical history.  Your symptoms.  Your blood pressure measurement. Your health care provider will check your blood pressure when you are: ? Lying down. ? Sitting. ? Standing.  A blood pressure reading is recorded as two numbers, such as "120 over 80" (or 120/80). The first ("top") number is called the systolic pressure. It is a measure of the pressure in your arteries as your heart beats. The second ("bottom") number is called the diastolic pressure. It is a measure of the pressure in your arteries when your heart relaxes between beats. Blood pressure is measured in a unit called mm Hg. Healthy blood pressure for adults is 120/80. If your blood pressure is below 90/60, you may be diagnosed with hypotension. Other information or tests that may be used to diagnose orthostatic hypotension include:  Your other vital signs, such as your heart rate and temperature.  Blood tests.  Tilt table test. For this test, you will be safely secured to a table that moves you from a lying position to an upright position. Your heart rhythm and blood pressure will be monitored during the test.  How is this treated? Treatment for this condition may include:  Changing your diet. This may involve eating more salt (sodium) or drinking more water.  Taking medicines to raise your blood pressure.  Changing the dosage of certain medicines you are taking that might be lowering your blood pressure.  Wearing compression stockings. These stockings help to prevent blood clots and reduce swelling in your legs.  In some cases, you may need to go to the hospital for:    Fluid replacement. This means you will receive fluids through an IV tube.  Blood replacement. This means you will receive donated blood through an IV tube (transfusion).  Treating an infection or heart problems, if this applies.  Monitoring. You may need to be monitored while medicines that you  are taking wear off.  Follow these instructions at home: Eating and drinking   Drink enough fluid to keep your urine clear or pale yellow.  Eat a healthy diet and follow instructions from your health care provider about eating or drinking restrictions. A healthy diet includes: ? Fresh fruits and vegetables. ? Whole grains. ? Lean meats. ? Low-fat dairy products.  Eat extra salt only as directed. Do not add extra salt to your diet unless your health care provider told you to do that.  Eat frequent, small meals.  Avoid standing up suddenly after eating. Medicines  Take over-the-counter and prescription medicines only as told by your health care provider. ? Follow instructions from your health care provider about changing the dosage of your current medicines, if this applies. ? Do not stop or adjust any of your medicines on your own. General instructions  Wear compression stockings as told by your health care provider.  Get up slowly from lying down or sitting positions. This gives your blood pressure a chance to adjust.  Avoid hot showers and excessive heat as directed by your health care provider.  Return to your normal activities as told by your health care provider. Ask your health care provider what activities are safe for you.  Do not use any products that contain nicotine or tobacco, such as cigarettes and e-cigarettes. If you need help quitting, ask your health care provider.  Keep all follow-up visits as told by your health care provider. This is important. Contact a health care provider if:  You vomit.  You have diarrhea.  You have a fever for more than 2-3 days.  You feel more thirsty than usual.  You feel weak and tired. Get help right away if:  You have chest pain.  You have a fast or irregular heartbeat.  You develop numbness in any part of your body.  You cannot move your arms or your legs.  You have trouble speaking.  You become sweaty or feel  lightheaded.  You faint.  You feel short of breath.  You have trouble staying awake.  You feel confused. This information is not intended to replace advice given to you by your health care provider. Make sure you discuss any questions you have with your health care provider. Document Released: 07/07/2002 Document Revised: 04/04/2016 Document Reviewed: 01/07/2016 [employer] Interactive Patient Education  2018 [employer]  

## 2017-01-28 NOTE — Progress Notes (Signed)
Willie Andrade to be D/C'd home per MD order.  Discussed with the patient and all questions fully answered.  VSS, Skin clean, dry and intact without evidence of skin break down, no evidence of skin tears noted. IV catheter discontinued intact. Site without signs and symptoms of complications. Dressing and pressure applied.  An After Visit Summary was printed and given to the patient. Patient received prescription.  D/c education completed with patient/family including follow up instructions, medication list, d/c activities limitations if indicated, with other d/c instructions as indicated by MD - patient able to verbalize understanding, all questions fully answered.   Patient instructed to return to ED, call 911, or call MD for any changes in condition.   Patient escorted via WC, and D/C home via Taxi.  Evern Bio 01/28/2017 6:28 PM

## 2017-01-28 NOTE — Discharge Summary (Signed)
[facility]  Physician Discharge Summary   Patient ID: Willie Andrade MRN: [id number] DOB/AGE: Jul 02, 1951 66 y.o.  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 Home health ordered  DISCHARGE CONDITION: fair  Diet recommendation: As before  Filed Weights   01/27/17 1454 01/27/17 1820 01/27/17 2123  Weight: 72.4 kg (159 lb 9.8 oz) 72.4 kg (159 lb 9.8 oz) 72.4 kg (159 lb 9.8 oz)    INITIAL HISTORY: 66 year old African-American male with a past medical history of end-stage renal disease on hemodialysis, chronic systolic CHF with ICD, chronic hypotension on midodrine, who was recently hospitalized and underwent left femoral peroneal bypass and was discharged on 6/29. When he reached the parking lot he felt weak, dizzy and collapsed to the floor, however, did not have any syncopal episode. Did not have any head injuries. He was brought back into the emergency department and was rehospitalized.  Consultations:  Nephrology  Procedures:  Hemodialysis 6/30  HOSPITAL COURSE:    Orthostatic hypotension  Apparently, patient was orthostatic in the emergency department. Patient has chronically low blood pressures. He is noted to be in midodrine. His low blood pressure could've contributed to his symptoms and presentation. Dose of midodrine was increased. Blood pressures have improved. Patient has ambulated without any difficulties. Home health will be ordered as recommended by PT.Marland Kitchen Patient has to get up slowly from a sitting or lying position.  ESRD on HD  Nephrology was consulted. He was dialyzed on 6/30. They plan to  shoot for higher dry weight.  Peripheral arterial disease   Treated during recent admission with left femoral-peroneal bypass graft. Some serious drainage from his wounds, but no evidence for active infection. Outpatient follow-up with vascular surgery.  Wound dehiscence  Surgical wound in medial left thigh with some separation, steri-strips falling off, serous weeping. Cleaned and steri-strips replaced in ED. Continue wound-care. Outpatient follow-up with vascular surgery.   Chronic systolic CHF status post ICD TTE (12/23/16) with EF 15-20%. Seems reasonably well compensated at this time. Volume-control with dialysis.  Hypotension  See discussion above. Midodrine dose has been increased after discussion with nephrology.  Overall, stable. Feels much better. Wants to go home.   PERTINENT LABS:  The results of significant diagnostics from this hospitalization (including imaging, microbiology, ancillary and laboratory) are listed below for reference.    Microbiology: Recent Results (from the past 240 hour(s))  Surgical PCR screen     Status: None   Collection Time: 01/19/17  5:27 AM  Result Value Ref Range Status   MRSA, PCR NEGATIVE NEGATIVE Final   Staphylococcus aureus NEGATIVE NEGATIVE Final    Comment:        The Xpert SA Assay (FDA approved for NASAL specimens in patients over 3 years of age), is one component of a comprehensive surveillance program.  Test performance has been validated by [facility] for patients greater than or equal to 78 year old. It is not intended to diagnose infection nor to guide or monitor treatment.      Labs: Basic Metabolic Panel:  Recent Labs Lab 01/24/17 0534 01/25/17 0347 01/26/17 0459 01/26/17 1100 01/27/17 0330  NA 136 137 136 136  135  K 4.5 4.4 3.9 4.0 4.2  CL 96* 97* 95* 95* 94*  CO2 23 25 26 26 28   GLUCOSE 62* 151* 98 96 89  BUN 12 20 14 12 19   CREATININE 5.23* 6.37* 5.01* 4.98* 6.25*  CALCIUM 7.4* 7.2* 7.4*  7.6* 7.3*  PHOS  --   --   --  3.6  --    Liver Function Tests:  Recent Labs Lab 01/26/17 1100  ALBUMIN 3.1*   CBC:  Recent Labs Lab 01/23/17 0316 01/24/17 0534 01/25/17 0347 01/26/17 0459 01/27/17 0330  WBC 8.2 8.6 13.0* 7.5 7.3  HGB 12.3* 12.1* 10.7* 10.7* 11.4*  HCT 40.8 40.1 36.6* 37.7* 38.8*  MCV 89.5 90.9 91.0 93.5 92.2  PLT 179 144* 171 139* 123*     IMAGING STUDIES Dg Toe 3rd Left  Result Date: 01/17/2017 CLINICAL DATA:  Acute onset of left foot swelling and pain. Open sore at the left third toe. Initial encounter. EXAM: LEFT THIRD TOE COMPARISON:  None. FINDINGS: The pattern of air at the distal left fourth toe raises question for infection with a gas producing organism, with overlying soft tissue disruption. No definite osseous erosions are characterized to suggest osteomyelitis, though it cannot be excluded on the basis of radiograph. Soft tissue swelling is noted about the toes. There is no evidence of fracture or dislocation. No radiopaque foreign bodies are seen. IMPRESSION: 1. Pattern of air at the distal left fourth toe raises question for infection with a gas producing organism, with overlying soft tissue disruption. Soft tissue swelling noted about the toes. 2. No definite osseous erosion seen to suggest osteomyelitis, though it cannot be excluded on the basis of radiograph. These results were called by telephone at the time of interpretation on 01/17/2017 at 12:16 am to [facility], who verbally acknowledged these results. Electronically Signed   By: Roanna Raider M.D.   On: 01/17/2017 00:16    DISCHARGE EXAMINATION: Vitals:   01/27/17 1820 01/27/17 1823 01/27/17 2123 01/28/17 0608  BP: (!) 100/50 126/65 99/62 94/68   Pulse: 86 79 78 63  Resp: 12  18 18   Temp: 97.8 F (36.6 C)  97.9 F (36.6 C) 97.6 F (36.4 C)  TempSrc:      SpO2:   98% (!) 72%  Weight: 72.4 kg (159 lb 9.8 oz)  72.4 kg (159 lb 9.8 oz)   Height:   6\' 2"  (1.88 m)   Saturations  recorded is erroneous. Patient is sitting comfortably on the bed.  General appearance: alert, cooperative, appears stated age and no distress Resp: clear to auscultation bilaterally Cardio: regular rate and rhythm, S1, S2 normal, no murmur, click, rub or gallop GI: soft, non-tender; bowel sounds normal; no masses,  no organomegaly   DISPOSITION: Home  Discharge Instructions    Call MD for:  difficulty breathing, headache or visual disturbances    Complete by:  As directed    Call MD for:  extreme fatigue    Complete by:  As directed    Call MD for:  persistant dizziness or light-headedness    Complete by:  As directed    Call MD for:  persistant nausea and vomiting    Complete by:  As directed    Call MD for:  redness, tenderness, or signs of infection (pain, swelling, redness, odor or green/yellow discharge around incision site)    Complete by:  As directed    Call MD for:  severe uncontrolled pain    Complete by:  As directed    Call MD for:  temperature >100.4    Complete by:  As directed    Discharge instructions    Complete by:  As directed    Please get up slowly from a lying or sitting position to avoid sudden drop in blood pressure.  You were cared for by a hospitalist during your hospital stay. If you have any questions about your discharge medications or the care you received while you were in the hospital after you are discharged, you can call the unit and asked to speak with the hospitalist on call if the hospitalist that took care of you is not available. Once you are discharged, your primary care physician will handle any further medical issues. Please note that NO REFILLS for any discharge medications will be authorized once you are discharged, as it is imperative that you return to your primary care physician (or establish a relationship with a primary care physician if you do not have one) for your aftercare needs so that they can reassess your need for medications and  monitor your lab values. If you do not have a primary care physician, you can call [phone] for a physician referral.   Increase activity slowly    Complete by:  As directed       ALLERGIES: No Known Allergies   Discharge Medication List as of 01/28/2017 11:13 AM    START taking these medications   Details  polyethylene glycol (MIRALAX / GLYCOLAX) packet Take 17 g by mouth daily as needed for mild constipation., Starting Sun 01/28/2017, Print      CONTINUE these medications which have CHANGED   Details  midodrine (PROAMATINE) 5 MG tablet Take 2 tablets (10 mg total) by mouth 3 (three) times daily with meals., Starting Sun 01/28/2017, Print      CONTINUE these medications which have NOT CHANGED   Details  calcium acetate (PHOSLO) 667 MG capsule Take 3 capsules (2,001 mg total) by mouth 3 (three) times daily with meals., Starting Sat 12/23/2016, Normal    cinacalcet (SENSIPAR) 30 MG tablet Take 6 tablets (180 mg total) by mouth every Tuesday, Thursday, and Saturday at 6 PM., Starting Sat 01/27/2017, Normal    gabapentin (NEURONTIN) 100 MG capsule Take 100 mg by mouth 2 (two) times daily. , Historical Med    lidocaine-prilocaine (EMLA) cream Apply 1 application topically Every Tuesday,Thursday,and Saturday with dialysis., Historical Med    multivitamin (RENA-VIT) TABS tablet Take 1 tablet by mouth at bedtime., Starting Fri 01/26/2017, Normal    Nutritional Supplements (FEEDING SUPPLEMENT, NEPRO CARB STEADY,) LIQD Take 237 mLs by mouth 2 (two) times daily between meals., Starting Sat 01/27/2017, No Print    oxyCODONE-acetaminophen (PERCOCET/ROXICET) 5-325 MG tablet Take 1-2 tablets by mouth every 3 (three) hours as needed for moderate pain., Starting Fri 01/26/2017, Print    sevelamer carbonate (RENVELA) 800 MG tablet Take 4 tablets (3,200 mg total) by mouth 3 (three) times daily with meals., Starting Fri 01/26/2017, Normal         Follow-up Information    [name], MD. Schedule an  appointment as soon as possible for a visit in 1 week(s).   Specialty:  Family Medicine Contact information: [address] [place] [address] [phone]        [name], MD. Schedule an appointment as soon as possible for a visit in 1 week(s).   Specialties:  Vascular Surgery, Cardiology Contact information: [address] [place] [address] [phone]  Health, [facility]-Home Follow up.   Why:  for home health PT OT Contact information: [address] [place] [address] [phone]           TOTAL DISCHARGE TIME: 35 minutes  [facility]  [facility] Pager [phone]  01/28/2017, 1:41 PM

## 2017-01-28 NOTE — Care Management Note (Signed)
Case Management Note  Patient Details  Name: Willie Andrade MRN: [id number] Date of Birth: 10/08/1950  Subjective/Objective:                 Patient with order to DC today, placed in obs after DC yesterday and falling in parking lot per notes. Patient would like to use [facility] for [facility] PT OT. Referral made to [facility], clinical liaison [facility]. Patient adamantly declined any DME for home use. No further CM needs identified.    Action/Plan:  DC to home w HH PT OT.  Expected Discharge Date:  01/28/17               Expected Discharge Plan:  Home w Home Health Services  In-House Referral:     Discharge planning Services  CM Consult  Post Acute Care Choice:  Home Health Choice offered to:  Patient  DME Arranged:    DME Agency:     HH Arranged:  PT, OT HH Agency:  [facility]  Status of Service:  Completed, signed off  If discussed at Long Length of Stay Meetings, dates discussed:    Additional Comments:  Lawerance Sabal, RN 01/28/2017, 10:17 AM

## 2017-01-30 ENCOUNTER — Telehealth: Payer: Self-pay | Admitting: Vascular Surgery

## 2017-01-30 ENCOUNTER — Encounter (HOSPITAL_COMMUNITY): Payer: Self-pay | Admitting: Emergency Medicine

## 2017-01-30 ENCOUNTER — Emergency Department (HOSPITAL_COMMUNITY)
Admission: EM | Admit: 2017-01-30 | Discharge: 2017-01-30 | Disposition: A | Discharge status: Home / Self Care | Payer: Medicare Other | Attending: Emergency Medicine | Admitting: Emergency Medicine

## 2017-01-30 DIAGNOSIS — I132 Hypertensive heart and chronic kidney disease with heart failure and with stage 5 chronic kidney disease, or end stage renal disease: Secondary | ICD-10-CM | POA: Diagnosis not present

## 2017-01-30 DIAGNOSIS — Y69 Unspecified misadventure during surgical and medical care: Secondary | ICD-10-CM | POA: Diagnosis not present

## 2017-01-30 DIAGNOSIS — Z79899 Other long term (current) drug therapy: Secondary | ICD-10-CM | POA: Insufficient documentation

## 2017-01-30 DIAGNOSIS — N186 End stage renal disease: Secondary | ICD-10-CM | POA: Diagnosis not present

## 2017-01-30 DIAGNOSIS — T8131XA Disruption of external operation (surgical) wound, not elsewhere classified, initial encounter: Secondary | ICD-10-CM | POA: Diagnosis not present

## 2017-01-30 DIAGNOSIS — F17210 Nicotine dependence, cigarettes, uncomplicated: Secondary | ICD-10-CM | POA: Insufficient documentation

## 2017-01-30 DIAGNOSIS — Z950 Presence of cardiac pacemaker: Secondary | ICD-10-CM | POA: Insufficient documentation

## 2017-01-30 DIAGNOSIS — G8918 Other acute postprocedural pain: Secondary | ICD-10-CM

## 2017-01-30 DIAGNOSIS — T8130XA Disruption of wound, unspecified, initial encounter: Secondary | ICD-10-CM

## 2017-01-30 DIAGNOSIS — I5022 Chronic systolic (congestive) heart failure: Secondary | ICD-10-CM | POA: Insufficient documentation

## 2017-01-30 DIAGNOSIS — M79672 Pain in left foot: Secondary | ICD-10-CM | POA: Diagnosis present

## 2017-01-30 DIAGNOSIS — Z992 Dependence on renal dialysis: Secondary | ICD-10-CM | POA: Diagnosis not present

## 2017-01-30 LAB — BASIC METABOLIC PANEL
ANION GAP: 15 (ref 5–15)
BUN: 13 mg/dL (ref 6–20)
CALCIUM: 7.8 mg/dL — AB (ref 8.9–10.3)
CO2: 29 mmol/L (ref 22–32)
CREATININE: 5.3 mg/dL — AB (ref 0.61–1.24)
Chloride: 97 mmol/L — ABNORMAL LOW (ref 101–111)
GFR, EST AFRICAN AMERICAN: 12 mL/min — AB (ref >60)
GFR, EST NON AFRICAN AMERICAN: 10 mL/min — AB (ref >60)
Glucose, Bld: 79 mg/dL (ref 65–99)
Potassium: 3.2 mmol/L — ABNORMAL LOW (ref 3.5–5.1)
SODIUM: 141 mmol/L (ref 135–145)

## 2017-01-30 LAB — CBC WITH DIFFERENTIAL/PLATELET
BASOS ABS: 0 10*3/uL (ref 0.0–0.1)
BASOS PCT: 0 %
EOS ABS: 0.1 10*3/uL (ref 0.0–0.7)
Eosinophils Relative: 1 %
HCT: 40 % (ref 39.0–52.0)
Hemoglobin: 12.2 g/dL — ABNORMAL LOW (ref 13.0–17.0)
Lymphocytes Relative: 13 %
Lymphs Abs: 1.1 10*3/uL (ref 0.7–4.0)
MCH: 27.9 pg (ref 26.0–34.0)
MCHC: 30.5 g/dL (ref 30.0–36.0)
MCV: 91.5 fL (ref 78.0–100.0)
MONO ABS: 0.8 10*3/uL (ref 0.1–1.0)
MONOS PCT: 9 %
NEUTROS PCT: 77 %
Neutro Abs: 6.4 10*3/uL (ref 1.7–7.7)
Platelets: 168 10*3/uL (ref 150–400)
RBC: 4.37 MIL/uL (ref 4.22–5.81)
RDW: 19.6 % — AB (ref 11.5–15.5)
WBC: 8.4 10*3/uL (ref 4.0–10.5)

## 2017-01-30 MED ORDER — SODIUM CHLORIDE 0.9 % IV BOLUS (SEPSIS): 1000.0000 mL | Freq: Once | INTRAVENOUS | Status: DC

## 2017-01-30 MED ORDER — MORPHINE SULFATE (PF) 4 MG/ML IV SOLN
4.0000 mg | Freq: Once | INTRAVENOUS | Status: DC
  Filled 2017-01-30: qty 1

## 2017-01-30 MED ORDER — SODIUM CHLORIDE 0.9 % IV BOLUS (SEPSIS)
500.0000 mL | Freq: Once | INTRAVENOUS | Status: AC
  Administered 2017-01-30: 500 mL via INTRAVENOUS

## 2017-01-30 MED ORDER — FENTANYL CITRATE (PF) 100 MCG/2ML IJ SOLN
25.0000 ug | Freq: Once | INTRAMUSCULAR | Status: AC
  Administered 2017-01-30: 25 ug via INTRAVENOUS
  Filled 2017-01-30: qty 2

## 2017-01-30 MED ORDER — OXYCODONE-ACETAMINOPHEN 5-325 MG PO TABS: 1.0000 | ORAL_TABLET | Freq: Four times a day (QID) | ORAL | 0 refills | Status: DC | PRN

## 2017-01-30 NOTE — ED Provider Notes (Signed)
[facility]-EMERGENCY DEPT Provider Note   CSN: [id number] Arrival date & time: 01/30/17  1840     History   Chief Complaint Chief Complaint  Patient presents with  . Post-op Problem  . Leg Pain    HPI Willie Andrade is a 66 y.o. male hx of ESRD on HD (last HD 4 days ago), recent L foot gangrene s/p bypass, Here presenting with worsening left foot pain, drainage from the wound site. Patient had L fem/pop bypass 2 weeks ago. Patient was readmitted several days ago for syncope and was discharged 2 days ago. While in the hospital, he was noted to have some wound dehiscence that was managed with Steri-Strips. Patient noticed some clear drainage from the surgical wound and he was concerned. He denies any fevers or chills or vomiting.   The history is provided by the patient.    Past Medical History:  Diagnosis Date  . Chronic systolic CHF (congestive heart failure), NYHA class 2 (HCC)   . ESRD (end stage renal disease) on dialysis (HCC)   . Hepatitis B   . HTN (hypertension)   . Hyperlipidemia   . 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 Tobacco abuse     Patient Active Problem List   Diagnosis Date Noted  . General weakness 01/26/2017  . Wound dehiscence 01/26/2017  . Hypotension   . Cellulitis of left foot 01/17/2017  . Cellulitis 01/17/2017  . Gangrene of toe of left foot (HCC)   . Pre-syncope 12/21/2016  . Chronic systolic heart failure (HCC) 10/11/2016  . 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 Pacemaker 10/10/2016  . ESRD (end stage renal disease) (HCC) 02/14/2016    Past Surgical History:  Procedure Laterality Date  . ABDOMINAL AORTOGRAM W/LOWER EXTREMITY N/A 01/22/2017   Procedure: Abdominal Aortogram w/Lower Extremity;  Surgeon: [name], MD;  Location: [facility] INVASIVE CV LAB;  Service: Cardiovascular;  Laterality: N/A;  . BIV ICD GENERATOR CHANGEOUT  N/A 11/28/2016   Procedure: BiV ICD [employer]; St Jude Surgeon: Will Jorja Loa, MD;  Location: [facility] INVASIVE CV LAB;  Service: Cardiovascular;  Laterality: N/A;  . BYPASS GRAFT FEMORAL-PERONEAL Left 01/24/2017   Procedure: Left Leg  FEMORAL-PERONEAL Bypass Graft;  Surgeon: [name], MD;  Location: [facility] OR;  Service: Vascular;  Laterality: Left;       Home Medications    Prior to Admission medications   Medication Sig Start Date End Date Taking? Authorizing Provider  calcium acetate (PHOSLO) 667 MG capsule Take 3 capsules (2,001 mg total) by mouth 3 (three) times daily with meals. 12/23/16   Clydia Llano, MD  cinacalcet (SENSIPAR) 30 MG tablet Take 6 tablets (180 mg total) by mouth every Tuesday, Thursday, and Saturday at 6 PM. 01/27/17   Edsel Petrin, DO  gabapentin (NEURONTIN) 100 MG capsule Take 100 mg by mouth 2 (two) times daily.     [provider]  lidocaine-prilocaine (EMLA) cream Apply 1 application topically Every Tuesday,Thursday,and Saturday with dialysis.    [provider]  midodrine (PROAMATINE) 5 MG tablet Take 2 tablets (10 mg total) by mouth 3 (three) times daily with meals. 01/28/17   Osvaldo Shipper, MD  multivitamin (RENA-VIT) TABS tablet Take 1 tablet by mouth at bedtime. 01/26/17   Mikhail, Nita Sells, DO  Nutritional Supplements (FEEDING SUPPLEMENT, NEPRO CARB STEADY,) LIQD Take 237 mLs by mouth 2 (two) times daily between meals. 01/27/17  Mikhail, Nita Sells, DO  oxyCODONE-acetaminophen (PERCOCET/ROXICET) 5-325 MG tablet Take 1-2 tablets by mouth every 3 (three) hours as needed for moderate pain. 01/26/17   Mikhail, Nita Sells, DO  polyethylene glycol (MIRALAX / GLYCOLAX) packet Take 17 g by mouth daily as needed for mild constipation. 01/28/17   Osvaldo Shipper, MD  sevelamer carbonate (RENVELA) 800 MG tablet Take 4 tablets (3,200 mg total) by mouth 3 (three) times daily with meals. 01/26/17   Edsel Petrin, DO    Family History Family History    Problem Relation Age of Onset  . Diabetes Mother   . Gout Mother   . Stroke Father   . Gout Maternal Grandmother   . Gout Sister   . Asthma Brother     Social History Social History  Substance Use Topics  . Smoking status: Current Every Day Smoker    Packs/day: 0.00    Types: Cigarettes  . Smokeless tobacco: Never Used  . Alcohol use No     Allergies   Patient has no known allergies.   Review of Systems Review of Systems  Musculoskeletal:       L leg pain, drainage from wound   Skin: Positive for wound.  All other systems reviewed and are negative.    Physical Exam Updated Vital Signs BP (!) 98/56   Pulse 84   Temp 97.8 F (36.6 C) (Oral)   Resp 18   Ht 6\' 2"  (1.88 m)   Wt 72.1 kg (159 lb)   SpO2 98%   BMI 20.41 kg/m   Physical Exam  Constitutional:  Chronically ill, uncomfortable   HENT:  Head: Normocephalic.  Eyes: Pupils are equal, round, and reactive to light.  Neck: Normal range of motion.  Cardiovascular: Normal rate, regular rhythm and normal heart sounds.   Pulmonary/Chest: Effort normal and breath sounds normal. No respiratory distress. He has no wheezes.  Abdominal: Soft. Bowel sounds are normal.  Musculoskeletal: Normal range of motion.  L fem/pop bypass that is healing. There is small area of dehiscence L calf area, no purulent drainage, no surrounding cellulitis. Diminished DP pulse L foot (chronic)   Neurological: He is alert.  Skin: Skin is warm. There is erythema.  Psychiatric: He has a normal mood and affect.  Nursing note and vitals reviewed.    ED Treatments / Results  Labs (all labs ordered are listed, but only abnormal results are displayed) Labs Reviewed  CBC WITH DIFFERENTIAL/PLATELET - Abnormal; Notable for the following:       Result Value   Hemoglobin 12.2 (*)    RDW 19.6 (*)    All other components within normal limits  BASIC METABOLIC PANEL - Abnormal; Notable for the following:    Potassium 3.2 (*)    Chloride  97 (*)    Creatinine, Ser 5.30 (*)    Calcium 7.8 (*)    GFR calc non Af Amer 10 (*)    GFR calc Af Amer 12 (*)    All other components within normal limits    EKG  EKG Interpretation None       Radiology No results found.  Procedures Procedures (including critical care time)  Medications Ordered in ED Medications  sodium chloride 0.9 % bolus 500 mL (500 mLs Intravenous New Bag/Given 01/30/17 2249)  fentaNYL (SUBLIMAZE) injection 25 mcg (25 mcg Intravenous Given 01/30/17 2313)     Initial Impression / Assessment and Plan / ED Course  I have reviewed the triage vital signs and the nursing notes.  Pertinent labs & imaging results that were available during my care of the patient were reviewed by me and considered in my medical decision making (see chart for details).     Travez Pullman is a 66 y.o. male here with L leg pain and drainage from the surgical site. Had wound dehiscence that was diagnosed during last visit. There is no signs of cellulitis or abscess. He has diminished pulse R foot which is chronic. Of note, his BP is is mid 90s but this is chronic per patient and his BP in the hospital was in mid 90s. WBC nl. Chemistry at baseline. He has follow up with Dr. Arbie Cookey next week. Stable for dc.    Final Clinical Impressions(s) / ED Diagnoses   Final diagnoses:  None    New Prescriptions New Prescriptions   No medications on file     Charlynne Pander, MD 01/30/17 2327

## 2017-01-30 NOTE — Telephone Encounter (Signed)
Sched appt 02/27/17 at 8:45. Lm on cell#.

## 2017-01-30 NOTE — ED Triage Notes (Signed)
Pt. Stated, Im bleeding and my bandage is due changing and my whole leg is hurting. I was discharge from the hospital and fell out on Saturday. Is an artery in there used. Came in here and the boy wrapped my leg good. Its hurting a lot so it needs to be looked at again.

## 2017-01-30 NOTE — Telephone Encounter (Signed)
-----   Message from Sharee Pimple, RN sent at 01/26/2017  9:16 AM EDT ----- Regarding: 2 weeks    ----- Message ----- From: Raymond Gurney, PA-C Sent: 01/26/2017   8:25 AM To: Vvs Charge Pool  S/p left femoral to peroneal bypass 01/24/17  F/u with Dr. Arbie Cookey in 2 weeks.   Thanks Selena Batten

## 2017-01-30 NOTE — Discharge Instructions (Signed)
Keep leg elevated.   Keep leg wrapped.   See Dr. Arbie Cookey next week.   Expect some drainage from the wound.   Return to ER if you have fever, worse foot pain, purulent drainage from the wound, worse redness.

## 2017-02-08 ENCOUNTER — Encounter: Payer: Self-pay | Admitting: Vascular Surgery

## 2017-02-08 ENCOUNTER — Ambulatory Visit (INDEPENDENT_AMBULATORY_CARE_PROVIDER_SITE_OTHER): Payer: Self-pay | Admitting: Vascular Surgery

## 2017-02-08 VITALS — BP 95/50 | HR 73 | Temp 98.1°F | Resp 20 | Ht 74.0 in | Wt 160.0 lb

## 2017-02-08 DIAGNOSIS — Z48812 Encounter for surgical aftercare following surgery on the circulatory system: Secondary | ICD-10-CM

## 2017-02-08 MED ORDER — OXYCODONE-ACETAMINOPHEN 5-325 MG PO TABS: 1.0000 | ORAL_TABLET | Freq: Four times a day (QID) | ORAL | 0 refills | Status: DC | PRN

## 2017-02-08 NOTE — Progress Notes (Signed)
Patient name: Willie Andrade MRN: [id number] DOB: [birth year] Sex: male  REASON FOR VISIT:    Wound check.  HPI:   Willie Andrade is a pleasant 66 y.o. male who presented with critical limb ischemia of the left foot. He underwent a left femoral to peroneal artery bypass with an in situ vein graft by Dr. Arbie Cookey on 01/24/2017. He was at Dr. Salena Saner office yesterday for pain medicine and on exam she had some concerns about his left groin wound. We set him up for an office visit this morning to check on his wound.  He complains of mild pain in the left groin.  Current Outpatient Prescriptions  Medication Sig Dispense Refill  . calcium acetate (PHOSLO) 667 MG capsule Take 3 capsules (2,001 mg total) by mouth 3 (three) times daily with meals. 270 capsule 2  . cinacalcet (SENSIPAR) 30 MG tablet Take 6 tablets (180 mg total) by mouth every Tuesday, Thursday, and Saturday at 6 PM. 180 tablet 0  . gabapentin (NEURONTIN) 100 MG capsule Take 100 mg by mouth 2 (two) times daily.     Marland Kitchen lidocaine-prilocaine (EMLA) cream Apply 1 application topically Every Tuesday,Thursday,and Saturday with dialysis.    . midodrine (PROAMATINE) 5 MG tablet Take 2 tablets (10 mg total) by mouth 3 (three) times daily with meals. 30 tablet 6  . multivitamin (RENA-VIT) TABS tablet Take 1 tablet by mouth at bedtime. 30 tablet 0  . Nutritional Supplements (FEEDING SUPPLEMENT, NEPRO CARB STEADY,) LIQD Take 237 mLs by mouth 2 (two) times daily between meals.  0  . oxyCODONE-acetaminophen (PERCOCET/ROXICET) 5-325 MG tablet Take 1 tablet by mouth every 6 (six) hours as needed for moderate pain. 10 tablet 0  . polyethylene glycol (MIRALAX / GLYCOLAX) packet Take 17 g by mouth daily as needed for mild constipation. 14 each 0  . sevelamer carbonate (RENVELA) 800 MG tablet Take 4 tablets (3,200 mg total) by mouth 3 (three) times daily with meals. 360 tablet 0   No current facility-administered medications for this visit.      REVIEW OF SYSTEMS:  [X]  denotes positive finding, [ ]  denotes negative finding Cardiac  Comments:  Chest pain or chest pressure:    Shortness of breath upon exertion: X   Short of breath when lying flat:    Irregular heart rhythm: X   Constitutional    Fever or chills:     PHYSICAL EXAM:   Vitals:   02/08/17 0829  BP: (!) 95/50  Pulse: 73  Resp: 20  Temp: 98.1 F (36.7 C)  TempSrc: Oral  SpO2: 98%  Weight: 160 lb (72.6 kg)  Height: 6\' 2"  (1.88 m)    GENERAL: The patient is a well-nourished male, in no acute distress. The vital signs are documented above. CARDIOVASCULAR: There is a regular rate and rhythm. PULMONARY: There is good air exchange bilaterally without wheezing. His incisions are intact. He has moderate left lower extremity swelling. There is swelling in the left groin just below the incision which is soft and looks like a lymphocele. The left foot is warm and well perfused.  MEDICAL ISSUES:   LYMPHOCELE LEFT GROIN: This patient is status post left femoral to peroneal artery bypass on 01/24/2017 and presents with swelling in the left groin. This was a lymphocele and I aspirated 47 cc of clear yellow fluid from the left groin. The swelling has resolved. I have explained to the patient that he is at risk for recurrence. He will keep his regularly scheduled follow  up appointment with Dr. Arbie Cookey. He will call sooner if there are problems.   Waverly Ferrari [facility] [phone]

## 2017-02-08 NOTE — Addendum Note (Signed)
Addended by: Chuck Hint on: 02/08/2017 09:16 AM   Modules accepted: Orders

## 2017-02-11 ENCOUNTER — Emergency Department (HOSPITAL_COMMUNITY): Payer: Medicare Other

## 2017-02-11 ENCOUNTER — Encounter (HOSPITAL_COMMUNITY): Payer: Self-pay

## 2017-02-11 ENCOUNTER — Inpatient Hospital Stay (HOSPITAL_COMMUNITY)
Admission: EM | Admit: 2017-02-11 | Discharge: 2017-02-13 | DRG: 091 | Disposition: A | Discharge status: Home Health | Payer: Medicare Other | Attending: Family Medicine | Admitting: Family Medicine

## 2017-02-11 DIAGNOSIS — I951 Orthostatic hypotension: Secondary | ICD-10-CM | POA: Diagnosis present

## 2017-02-11 DIAGNOSIS — I5022 Chronic systolic (congestive) heart failure: Secondary | ICD-10-CM | POA: Diagnosis present

## 2017-02-11 DIAGNOSIS — E43 Unspecified severe protein-calorie malnutrition: Secondary | ICD-10-CM | POA: Diagnosis present

## 2017-02-11 DIAGNOSIS — I96 Gangrene, not elsewhere classified: Secondary | ICD-10-CM | POA: Diagnosis not present

## 2017-02-11 DIAGNOSIS — I959 Hypotension, unspecified: Secondary | ICD-10-CM | POA: Diagnosis not present

## 2017-02-11 DIAGNOSIS — R4182 Altered mental status, unspecified: Secondary | ICD-10-CM | POA: Diagnosis not present

## 2017-02-11 DIAGNOSIS — I898 Other specified noninfective disorders of lymphatic vessels and lymph nodes: Secondary | ICD-10-CM | POA: Diagnosis present

## 2017-02-11 DIAGNOSIS — F17210 Nicotine dependence, cigarettes, uncomplicated: Secondary | ICD-10-CM | POA: Diagnosis present

## 2017-02-11 DIAGNOSIS — T40601A Poisoning by unspecified narcotics, accidental (unintentional), initial encounter: Secondary | ICD-10-CM | POA: Diagnosis present

## 2017-02-11 DIAGNOSIS — E785 Hyperlipidemia, unspecified: Secondary | ICD-10-CM | POA: Diagnosis present

## 2017-02-11 DIAGNOSIS — N186 End stage renal disease: Secondary | ICD-10-CM | POA: Diagnosis present

## 2017-02-11 DIAGNOSIS — Z992 Dependence on renal dialysis: Secondary | ICD-10-CM

## 2017-02-11 DIAGNOSIS — A419 Sepsis, unspecified organism: Secondary | ICD-10-CM | POA: Diagnosis present

## 2017-02-11 DIAGNOSIS — Z95810 Presence of automatic (implantable) cardiac defibrillator: Secondary | ICD-10-CM

## 2017-02-11 DIAGNOSIS — G92 Toxic encephalopathy: Secondary | ICD-10-CM | POA: Diagnosis not present

## 2017-02-11 DIAGNOSIS — T402X5A Adverse effect of other opioids, initial encounter: Secondary | ICD-10-CM | POA: Diagnosis present

## 2017-02-11 DIAGNOSIS — Z6820 Body mass index (BMI) 20.0-20.9, adult: Secondary | ICD-10-CM

## 2017-02-11 DIAGNOSIS — N2581 Secondary hyperparathyroidism of renal origin: Secondary | ICD-10-CM | POA: Diagnosis present

## 2017-02-11 DIAGNOSIS — T402X1A Poisoning by other opioids, accidental (unintentional), initial encounter: Secondary | ICD-10-CM | POA: Diagnosis present

## 2017-02-11 DIAGNOSIS — E8889 Other specified metabolic disorders: Secondary | ICD-10-CM | POA: Diagnosis present

## 2017-02-11 DIAGNOSIS — I132 Hypertensive heart and chronic kidney disease with heart failure and with stage 5 chronic kidney disease, or end stage renal disease: Secondary | ICD-10-CM | POA: Diagnosis present

## 2017-02-11 DIAGNOSIS — Z79899 Other long term (current) drug therapy: Secondary | ICD-10-CM

## 2017-02-11 LAB — COMPREHENSIVE METABOLIC PANEL
ALBUMIN: 2.6 g/dL — AB (ref 3.5–5.0)
ALK PHOS: 113 U/L (ref 38–126)
ALT: 7 U/L — ABNORMAL LOW (ref 17–63)
AST: 20 U/L (ref 15–41)
Anion gap: 15 (ref 5–15)
BILIRUBIN TOTAL: 0.6 mg/dL (ref 0.3–1.2)
BUN: 24 mg/dL — ABNORMAL HIGH (ref 6–20)
CALCIUM: 6.7 mg/dL — AB (ref 8.9–10.3)
CO2: 26 mmol/L (ref 22–32)
Chloride: 98 mmol/L — ABNORMAL LOW (ref 101–111)
Creatinine, Ser: 6.62 mg/dL — ABNORMAL HIGH (ref 0.61–1.24)
GFR calc non Af Amer: 8 mL/min — ABNORMAL LOW (ref >60)
GFR, EST AFRICAN AMERICAN: 9 mL/min — AB (ref >60)
GLUCOSE: 102 mg/dL — AB (ref 65–99)
POTASSIUM: 3.2 mmol/L — AB (ref 3.5–5.1)
Sodium: 139 mmol/L (ref 135–145)
TOTAL PROTEIN: 6.3 g/dL — AB (ref 6.5–8.1)

## 2017-02-11 LAB — LACTIC ACID, PLASMA: LACTIC ACID, VENOUS: 1.9 mmol/L (ref 0.5–1.9)

## 2017-02-11 LAB — CBC WITH DIFFERENTIAL/PLATELET
Basophils Absolute: 0 10*3/uL (ref 0.0–0.1)
Basophils Relative: 0 %
EOS ABS: 0.1 10*3/uL (ref 0.0–0.7)
Eosinophils Relative: 1 %
HEMATOCRIT: 36.9 % — AB (ref 39.0–52.0)
HEMOGLOBIN: 11.4 g/dL — AB (ref 13.0–17.0)
LYMPHS ABS: 0.6 10*3/uL — AB (ref 0.7–4.0)
Lymphocytes Relative: 5 %
MCH: 27.9 pg (ref 26.0–34.0)
MCHC: 30.9 g/dL (ref 30.0–36.0)
MCV: 90.4 fL (ref 78.0–100.0)
MONO ABS: 0.8 10*3/uL (ref 0.1–1.0)
MONOS PCT: 7 %
NEUTROS ABS: 9.6 10*3/uL — AB (ref 1.7–7.7)
Neutrophils Relative %: 87 %
Platelets: 188 10*3/uL (ref 150–400)
RBC: 4.08 MIL/uL — ABNORMAL LOW (ref 4.22–5.81)
RDW: 19.6 % — AB (ref 11.5–15.5)
WBC: 11.1 10*3/uL — ABNORMAL HIGH (ref 4.0–10.5)

## 2017-02-11 LAB — PROCALCITONIN: PROCALCITONIN: 0.81 ng/mL

## 2017-02-11 LAB — APTT: aPTT: 38 seconds — ABNORMAL HIGH (ref 24–36)

## 2017-02-11 LAB — BRAIN NATRIURETIC PEPTIDE

## 2017-02-11 LAB — PROTIME-INR
INR: 1.22
PROTHROMBIN TIME: 15.5 s — AB (ref 11.4–15.2)

## 2017-02-11 LAB — I-STAT TROPONIN, ED: TROPONIN I, POC: 0.02 ng/mL (ref 0.00–0.08)

## 2017-02-11 LAB — I-STAT CG4 LACTIC ACID, ED: LACTIC ACID, VENOUS: 1.73 mmol/L (ref 0.5–1.9)

## 2017-02-11 MED ORDER — DOCUSATE SODIUM 283 MG RE ENEM
1.0000 | ENEMA | RECTAL | Status: DC | PRN
  Filled 2017-02-11: qty 1

## 2017-02-11 MED ORDER — VANCOMYCIN HCL IN DEXTROSE 1-5 GM/200ML-% IV SOLN
1000.0000 mg | Freq: Once | INTRAVENOUS | Status: AC
  Administered 2017-02-11: 1000 mg via INTRAVENOUS
  Filled 2017-02-11: qty 200

## 2017-02-11 MED ORDER — NEPRO/CARBSTEADY PO LIQD
237.0000 mL | Freq: Three times a day (TID) | ORAL | Status: DC | PRN
  Filled 2017-02-11: qty 237

## 2017-02-11 MED ORDER — OXYCODONE-ACETAMINOPHEN 5-325 MG PO TABS
1.0000 | ORAL_TABLET | Freq: Four times a day (QID) | ORAL | Status: DC | PRN
  Administered 2017-02-13 (×2): 1 via ORAL
  Filled 2017-02-11: qty 1

## 2017-02-11 MED ORDER — POTASSIUM CHLORIDE CRYS ER 20 MEQ PO TBCR
40.0000 meq | EXTENDED_RELEASE_TABLET | Freq: Once | ORAL | Status: AC
  Administered 2017-02-11: 40 meq via ORAL
  Filled 2017-02-11: qty 2

## 2017-02-11 MED ORDER — SALICYLIC ACID 2 % EX CREA: 1.0000 "application " | TOPICAL_CREAM | Freq: Every day | CUTANEOUS | Status: DC | PRN

## 2017-02-11 MED ORDER — PIPERACILLIN-TAZOBACTAM 3.375 G IVPB 30 MIN
3.3750 g | Freq: Once | INTRAVENOUS | Status: AC
  Administered 2017-02-11: 3.375 g via INTRAVENOUS
  Filled 2017-02-11: qty 50

## 2017-02-11 MED ORDER — DIALYVITE 800/ZINC 0.8 MG PO TABS: 1.0000 | ORAL_TABLET | Freq: Every day | ORAL | Status: DC

## 2017-02-11 MED ORDER — SODIUM CHLORIDE 0.9% FLUSH
3.0000 mL | Freq: Two times a day (BID) | INTRAVENOUS | Status: DC
  Administered 2017-02-11 – 2017-02-13 (×3): 3 mL via INTRAVENOUS

## 2017-02-11 MED ORDER — NEPRO/CARBSTEADY PO LIQD
237.0000 mL | Freq: Two times a day (BID) | ORAL | Status: DC
  Administered 2017-02-12: 237 mL via ORAL
  Filled 2017-02-11 (×6): qty 237

## 2017-02-11 MED ORDER — ENOXAPARIN SODIUM 30 MG/0.3ML ~~LOC~~ SOLN
30.0000 mg | SUBCUTANEOUS | Status: DC
  Administered 2017-02-11 – 2017-02-12 (×2): 30 mg via SUBCUTANEOUS
  Filled 2017-02-11 (×2): qty 0.3

## 2017-02-11 MED ORDER — HYDROXYZINE HCL 25 MG PO TABS
25.0000 mg | ORAL_TABLET | Freq: Three times a day (TID) | ORAL | Status: DC | PRN
  Administered 2017-02-11: 25 mg via ORAL
  Filled 2017-02-11: qty 1

## 2017-02-11 MED ORDER — PIPERACILLIN-TAZOBACTAM 3.375 G IVPB
3.3750 g | Freq: Two times a day (BID) | INTRAVENOUS | Status: DC
  Administered 2017-02-12 – 2017-02-13 (×3): 3.375 g via INTRAVENOUS
  Filled 2017-02-11 (×4): qty 50

## 2017-02-11 MED ORDER — POLYETHYLENE GLYCOL 3350 17 G PO PACK: 17.0000 g | PACK | Freq: Every day | ORAL | Status: DC | PRN

## 2017-02-11 MED ORDER — MIDODRINE HCL 5 MG PO TABS
10.0000 mg | ORAL_TABLET | Freq: Three times a day (TID) | ORAL | Status: DC
  Administered 2017-02-12 – 2017-02-13 (×4): 10 mg via ORAL
  Filled 2017-02-11 (×5): qty 2

## 2017-02-11 MED ORDER — SORBITOL 70 % SOLN: 30.0000 mL | Status: DC | PRN

## 2017-02-11 MED ORDER — ACETAMINOPHEN 650 MG RE SUPP: 650.0000 mg | Freq: Four times a day (QID) | RECTAL | Status: DC | PRN

## 2017-02-11 MED ORDER — SODIUM CHLORIDE 0.9 % IV BOLUS (SEPSIS)
500.0000 mL | Freq: Once | INTRAVENOUS | Status: AC
  Administered 2017-02-11: 500 mL via INTRAVENOUS

## 2017-02-11 MED ORDER — RENA-VITE PO TABS
1.0000 | ORAL_TABLET | Freq: Every day | ORAL | Status: DC
Start: 2017-02-12 — End: 2017-02-12

## 2017-02-11 MED ORDER — DIPHENHYDRAMINE HCL 50 MG/ML IJ SOLN
25.0000 mg | Freq: Once | INTRAMUSCULAR | Status: AC
  Administered 2017-02-11: 25 mg via INTRAVENOUS
  Filled 2017-02-11: qty 1

## 2017-02-11 MED ORDER — SODIUM CHLORIDE 0.9 % IV BOLUS (SEPSIS): 250.0000 mL | Freq: Once | INTRAVENOUS | Status: DC | PRN

## 2017-02-11 MED ORDER — DIPHENHYDRAMINE HCL 25 MG PO CAPS: 25.0000 mg | ORAL_CAPSULE | Freq: Four times a day (QID) | ORAL | Status: DC | PRN

## 2017-02-11 MED ORDER — VANCOMYCIN HCL IN DEXTROSE 750-5 MG/150ML-% IV SOLN
750.0000 mg | INTRAVENOUS | Status: DC | PRN
  Filled 2017-02-11: qty 150

## 2017-02-11 MED ORDER — DOCUSATE SODIUM 100 MG PO CAPS
100.0000 mg | ORAL_CAPSULE | Freq: Two times a day (BID) | ORAL | Status: DC
  Administered 2017-02-12 (×2): 100 mg via ORAL
  Filled 2017-02-11 (×3): qty 1

## 2017-02-11 MED ORDER — CALCIUM ACETATE (PHOS BINDER) 667 MG PO CAPS
2001.0000 mg | ORAL_CAPSULE | Freq: Three times a day (TID) | ORAL | Status: DC
  Administered 2017-02-12 (×3): 2001 mg via ORAL
  Filled 2017-02-11 (×4): qty 3

## 2017-02-11 MED ORDER — CAMPHOR-MENTHOL 0.5-0.5 % EX LOTN
1.0000 "application " | TOPICAL_LOTION | Freq: Three times a day (TID) | CUTANEOUS | Status: DC | PRN
  Filled 2017-02-11: qty 222

## 2017-02-11 MED ORDER — SEVELAMER CARBONATE 800 MG PO TABS: 3200.0000 mg | ORAL_TABLET | Freq: Three times a day (TID) | ORAL | Status: DC

## 2017-02-11 MED ORDER — ZOLPIDEM TARTRATE 5 MG PO TABS: 5.0000 mg | ORAL_TABLET | Freq: Every evening | ORAL | Status: DC | PRN

## 2017-02-11 MED ORDER — ONDANSETRON HCL 4 MG PO TABS: 4.0000 mg | ORAL_TABLET | Freq: Four times a day (QID) | ORAL | Status: DC | PRN

## 2017-02-11 MED ORDER — ACETAMINOPHEN 325 MG PO TABS: 650.0000 mg | ORAL_TABLET | Freq: Four times a day (QID) | ORAL | Status: DC | PRN

## 2017-02-11 MED ORDER — ONDANSETRON HCL 4 MG/2ML IJ SOLN: 4.0000 mg | Freq: Four times a day (QID) | INTRAMUSCULAR | Status: DC | PRN

## 2017-02-11 MED ORDER — CINACALCET HCL 30 MG PO TABS: 180.0000 mg | ORAL_TABLET | ORAL | Status: DC

## 2017-02-11 MED ORDER — CALCIUM CARBONATE ANTACID 1250 MG/5ML PO SUSP
500.0000 mg | Freq: Four times a day (QID) | ORAL | Status: DC | PRN
  Filled 2017-02-11: qty 5

## 2017-02-11 MED ORDER — GABAPENTIN 100 MG PO CAPS
100.0000 mg | ORAL_CAPSULE | Freq: Two times a day (BID) | ORAL | Status: DC
  Administered 2017-02-11 – 2017-02-13 (×4): 100 mg via ORAL
  Filled 2017-02-11 (×4): qty 1

## 2017-02-11 NOTE — Progress Notes (Signed)
Pharmacy Antibiotic Note  Kreston Jallow is a 66 y.o. male admitted on 02/11/2017 with c/o of overdose and later paged out as code sepsis.  Pharmacy has been consulted for vancomycin and Zosyn dosing. Pt is ESRD; WBC 11.1, afebrile  Plan: Vancomycin 1000mg  IV once then 750mg  IV every Hemodialysis.  Goal trough 15-20 mcg/mL. Zosyn 3.375g IV q8h (4 hour infusion). F/U clinical progression and LOT  Height: 6\' 2"  (188 cm) Weight: 160 lb (72.6 kg) IBW/kg (Calculated) : 82.2  Temp (24hrs), Avg:98.3 F (36.8 C), Min:98 F (36.7 C), Max:98.5 F (36.9 C)   Recent Labs Lab 02/11/17 1651 02/11/17 1709  WBC 11.1*  --   LATICACIDVEN  --  1.73    Estimated Creatinine Clearance: 14.1 mL/min (A) (by C-G formula based on SCr of 5.3 mg/dL (H)).    No Known Allergies  Antimicrobials this admission: 7/15 Vancomycin>> 7/15 Zosyn>>  Dose adjustments this admission: n/a  Microbiology results: 7/15 BCx:  Thank you for allowing pharmacy to be a part of this patient's care.  Toniann Fail Amira Podolak 02/11/2017 5:34 PM

## 2017-02-11 NOTE — ED Notes (Signed)
Dr Ray at bedside. 

## 2017-02-11 NOTE — ED Notes (Signed)
Pt doesn't make urine any longer reports its been 6 years

## 2017-02-11 NOTE — ED Notes (Signed)
Patient transported to CT 

## 2017-02-11 NOTE — ED Notes (Signed)
Attempted report x1. 

## 2017-02-11 NOTE — H&P (Signed)
History and Physical    Willie Andrade ZOX:[id number] DOB: [birth year] DOA: 02/11/2017  PCP: Willie Rakers, MD Consultants:   Patient coming from: Home  Chief Complaint: altered mental status  HPI: Willie Andrade is a 66 y.o. male with medical history significant of end-stage renal disease on hemodialysis, chronic systolic CHF with ICD, chronic hypotension, and anemia presenting with altered mental status.  He reports that he was sitting in the yard with a friend.  "I told him it was too hot to go out but like a dumbass I went out there and next thing I woke up in the yard."  He thinks he took too much of his medicine.  He doesn't know how many Percocets, "if I knew all that I wouldn't be up here would I?"  He was too somnolent to continue answering my questions at that point.    HPI per Dr. Rosalia Andrade:  66 year old man end-stage renal disease on dialysis, peripheral vascular disease, smoker, hypertension, hyperlipidemia, chronic systolic heart failure status post recent left femoral peroneal bypass graft 627 by Dr. early presents today with altered mental status. He states that the pain in his foot was worse and he took 4 Percocet today. Initial report from EMS is that this was an overdose. He received some Narcan prehospital and was more awake. Pupils reported pinpoint. Patient's sister is also in the room who is the same house. She reports that the left foot has become more swollen. The toes are discolored and appeared to be worsening to her and the patient. He does not report any headache or head injury. He denies any cough, chest pain, shortness of breath, abdominal pain, nausea, vomiting, or diarrhea. He states his left foot is very painful. He states that there is swelling in the left groin. He reports he was seen in the vascular surgery office on Friday and states he had some fluid drawn from this area. Note from Dr. Durwin Andrade on IV states that he was noted to have a mucocele of the left groin 47 mL of  clear yellow fluid were aspirated. Reports that the swelling resolved. He reports that his current dialysis access is in the left forearm.  He has had a number of recent hospitalizations/ER visits: -7/12 - see by Dr. 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  Orthopedics thinks patient may need debridement and/or amputation following vascular intervention - which was performed with left fem to peroneal bypass 6/27; anemia; chronic heart failure   ED Course: Per Dr. Rosalia Andrade: 1- ams- appears c.w. od narcotic- patient improved after narcan. However, cannot rule out hypotension, infection,  2- gangrenous toes left foot- patient s/p revascularization surgery , but toes continue be necrotic- Family thinks worsening,. Possible source of infection. Patietn treated here with vanc and zosyn. I was unable to palpate dp pulse left and nursing is attempting doppler .  3- esrd- on dialysis. Patient went to dialysis yesterday but only completed 3 hours.  4- hypotension- patient with recent similar presentation and midodrine was increased. Patient reports that his dry weight was also increased.    Review of Systems: Unable to perform due to sedation   Past Medical History:  Diagnosis Date  . Chronic systolic CHF (congestive heart failure), NYHA class 2 (HCC)   . ESRD (end stage renal disease) on dialysis (HCC)   .  Hepatitis B   . HTN (hypertension)   . Hyperlipidemia   . 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 Tobacco abuse     Past Surgical History:  Procedure Laterality Date  . ABDOMINAL AORTOGRAM W/LOWER EXTREMITY N/A 01/22/2017   Procedure: Abdominal Aortogram w/Lower Extremity;  Surgeon: Willie Earthly, MD;  Location: [facility] INVASIVE  CV LAB;  Service: Cardiovascular;  Laterality: N/A;  . BIV ICD GENERATOR CHANGEOUT N/A 11/28/2016   Procedure: BiV ICD [employer]; St Jude Surgeon: Willie Jorja Loa, MD;  Location: [facility] INVASIVE CV LAB;  Service: Cardiovascular;  Laterality: N/A;  . BYPASS GRAFT FEMORAL-PERONEAL Left 01/24/2017   Procedure: Left Leg  FEMORAL-PERONEAL Bypass Graft;  Surgeon: Willie Earthly, MD;  Location: [facility] OR;  Service: Vascular;  Laterality: Left;    Social History   Social History  . Marital status: Legally Separated    Spouse name: N/A  . Number of children: N/A  . Years of education: N/A   Occupational History  . Not on file.   Social History Main Topics  . Smoking status: Current Every Day Smoker    Packs/day: 1.00    Types: Cigarettes  . Smokeless tobacco: Never Used  . Alcohol use No  . Drug use: No  . Sexual activity: Not on file   Other Topics Concern  . Not on file   Social History Narrative  . No narrative on file    No Known Allergies  Family History  Problem Relation Age of Onset  . Diabetes Mother   . Gout Mother   . Stroke Father   . Gout Maternal Grandmother   . Gout Sister   . Asthma Brother     Prior to Admission medications   Medication Sig Start Date End Date Taking? Authorizing Provider  calcium acetate (PHOSLO) 667 MG capsule Take 3 capsules (2,001 mg total) by mouth 3 (three) times daily with meals. 12/23/16   Willie Llano, MD  cinacalcet (SENSIPAR) 30 MG tablet Take 6 tablets (180 mg total) by mouth every Tuesday, Thursday, and Saturday at 6 PM. 01/27/17   Willie Petrin, DO  gabapentin (NEURONTIN) 100 MG capsule Take 100 mg by mouth 2 (two) times daily.     [provider]  lidocaine-prilocaine (EMLA) cream Apply 1 application topically Every Tuesday,Thursday,and Saturday with dialysis.    [provider]  midodrine (PROAMATINE) 5 MG tablet Take 2 tablets (10 mg total) by mouth 3 (three) times daily with meals. 01/28/17   Willie Shipper, MD  multivitamin (RENA-VIT) TABS tablet Take 1 tablet by mouth at bedtime. 01/26/17   Mikhail, Willie Sells, DO  Nutritional Supplements (FEEDING SUPPLEMENT, NEPRO CARB STEADY,) LIQD Take 237 mLs by mouth 2 (two) times daily between meals. 01/27/17   Willie Petrin, DO  oxyCODONE-acetaminophen (PERCOCET/ROXICET) 5-325 MG tablet Take 1 tablet by mouth every 6 (six) hours as needed for moderate pain. 01/30/17   Charlynne Pander, MD  oxyCODONE-acetaminophen (PERCOCET/ROXICET) 5-325 MG tablet Take 1-2 tablets by mouth every 6 (six) hours as needed for severe pain. 02/08/17   Chuck Hint, MD  polyethylene glycol [facility] Ethelene Hal) packet Take 17 g by mouth daily as needed for mild constipation. 01/28/17   Willie Shipper, MD  sevelamer carbonate (RENVELA) 800 MG tablet Take 4 tablets (3,200 mg total) by mouth 3 (three) times daily with meals. 01/26/17   Willie Petrin, DO    Physical Exam: Vitals:   02/11/17 1906 02/11/17  1916 02/11/17 1930 02/11/17 2020  BP:  (!) 82/65 (!) 83/57 (!) 105/58  Pulse:  89 78 83  Resp:  20  18  Temp:      TempSrc:      SpO2:  99% 99% 99%  Weight: 72.4 kg (159 lb 9.8 oz)     Height:         General:  Appears somnolent and is NAD; he is able to wake up and answer questions appropriately but then drifts back to sleep mid-sentence Eyes:  PERRL, EOMI, normal lids, iris ENT:  grossly normal hearing, lips & tongue, mmm Neck:  no LAD, masses or thyromegaly Cardiovascular:  RRR, no m/r/g. No LE edema.  Respiratory:  CTA bilaterally, no w/r/r. Normal respiratory effort. Abdomen:  soft, ntnd, NABS Skin:  He has a long surgical incision from his left medial ankle to groin with intermittent healing.  There is a seroma in the left groin that the patient states is improved. Musculoskeletal:  Gangrenous toes with drainage and surrounding erythema of the foot with TTP on the left.  The right toes are also draining and with apparent suboptimal  circulation. Psychiatric:  Somnolent, cantankerous Neurologic:  Unable to perform due to somnolence, grossly normal  Labs on Admission: I have personally reviewed following labs and imaging studies  CBC:  Recent Labs Lab 02/11/17 1651  WBC 11.1*  NEUTROABS 9.6*  HGB 11.4*  HCT 36.9*  MCV 90.4  PLT 188   Basic Metabolic Panel:  Recent Labs Lab 02/11/17 1651  NA 139  K 3.2*  CL 98*  CO2 26  GLUCOSE 102*  BUN 24*  CREATININE 6.62*  CALCIUM 6.7*   GFR: Estimated Creatinine Clearance: 11.2 mL/min (A) (by C-G formula based on SCr of 6.62 mg/dL (H)). Liver Function Tests:  Recent Labs Lab 02/11/17 1651  AST 20  ALT 7*  ALKPHOS 113  BILITOT 0.6  PROT 6.3*  ALBUMIN 2.6*   No results for input(s): LIPASE, AMYLASE in the last 168 hours. No results for input(s): AMMONIA in the last 168 hours. Coagulation Profile: No results for input(s): INR, PROTIME in the last 168 hours. Cardiac Enzymes: No results for input(s): CKTOTAL, CKMB, CKMBINDEX, TROPONINI in the last 168 hours. BNP (last 3 results) No results for input(s): PROBNP in the last 8760 hours. HbA1C: No results for input(s): HGBA1C in the last 72 hours. CBG: No results for input(s): GLUCAP in the last 168 hours. Lipid Profile: No results for input(s): CHOL, HDL, LDLCALC, TRIG, CHOLHDL, LDLDIRECT in the last 72 hours. Thyroid Function Tests: No results for input(s): TSH, T4TOTAL, FREET4, T3FREE, THYROIDAB in the last 72 hours. Anemia Panel: No results for input(s): VITAMINB12, FOLATE, FERRITIN, TIBC, IRON, RETICCTPCT in the last 72 hours. Urine analysis: No results found for: COLORURINE, APPEARANCEUR, LABSPEC, PHURINE, GLUCOSEU, HGBUR, BILIRUBINUR, KETONESUR, PROTEINUR, UROBILINOGEN, NITRITE, LEUKOCYTESUR  Creatinine Clearance: Estimated Creatinine Clearance: 11.2 mL/min (A) (by C-G formula based on SCr of 6.62 mg/dL (H)).  Sepsis Labs: @LABRCNTIP (procalcitonin:4,lacticidven:4) )No results found for this  or any previous visit (from the past 240 hour(s)).   Radiological Exams on Admission: Ct Head Wo Contrast  Result Date: 02/11/2017 CLINICAL DATA:  Overdose, altered mental status, revised with Narcan, vomiting EXAM: CT HEAD WITHOUT CONTRAST TECHNIQUE: Contiguous axial images were obtained from the base of the skull through the vertex without intravenous contrast. Sagittal and coronal MPR images reconstructed from axial data set. COMPARISON:  08/12/2016 FINDINGS: Brain: Generalized atrophy. Normal ventricular morphology. No midline shift or mass effect.  Normal appearance of brain parenchyma. No intracranial hemorrhage, mass lesion, evidence of acute infarction, or extra-axial fluid collection. Vascular: Atherosclerotic calcification of internal carotid and vertebral arteries at skullbase Skull: Intact Sinuses/Orbits: Clear Other: N/A IMPRESSION: No acute intracranial abnormalities. Electronically Signed   By: Ulyses Southward M.D.   On: 02/11/2017 16:47   Dg Chest Port 1 View  Result Date: 02/11/2017 CLINICAL DATA:  Recent drug overdose EXAM: PORTABLE CHEST 1 VIEW COMPARISON:  12/22/2016 FINDINGS: Cardiac shadow remains enlarged. A defibrillator is again seen and stable. Aortic calcifications are again noted. No focal infiltrate or sizable effusion is seen. No acute bony abnormality is noted. IMPRESSION: No acute abnormality noted. Electronically Signed   By: Alcide Clever M.D.   On: 02/11/2017 17:14    EKG: Independently reviewed.  NSR with rate 75; IVCD; nonspecific ST changes with no evidence of acute ischemia, does not appear to be markedly different from prior EKG  Assessment/Plan Principal Problem:   Hypotension Active Problems:   ESRD (end stage renal disease) (HCC)   Chronic systolic heart failure (HCC)   Gangrene of toe of left foot (HCC)   Narcotic overdose   Sepsis (HCC)   Severe protein-calorie malnutrition (HCC)   Acute on chronic hypotension -Patient with known chronic hypotension,  on Midodrine 10 mg TID (dose recently increased) -Likely also contributed to be oversedation from Percocet overdose (see below) -Patient discussed with Dr. Arlean Hopping -Willie assess for sepsis (see below), as this could be contributing -Willie give small IVF boluses of 250 cc at a time up to 1L to maintain MAP >65 -Try to avoid giving more than 1.5-2L total IVF -Willie observe overnight in SDU  ESRD -Not able to complete last dialysis, 3 hour by report -BUN 24/Creatinine 6.62/GFR 8 - unchanged -Attends [facility] -Would not currently be able to be dialyzed due to BP -Discussed with Dr. Arlean Hopping; nephrology to see patient in AM -Calcium 6.7; check ionized calcium  Gangrene of the left foot with possible sepsis -Despite 6/27 bypass, patient continues to have apparent gangrene of the toes on the left foot. -Needs vascular surgery evaluation in the AM, as he appears likely to need amputation in the fairly near future -For now, Willie treat with Vanc/Zosyn again. -Elevated WBC count (11.1) with borderline lactate to 1.73 and borderline hypotension -While awaiting blood cultures, this may be a preseptic condition. -Sepsis protocol initiated -Blood cultures pending -Willie trend lactate and procalcitonin  AMS due to narcotic overdose -Improved after Narcan but patient became somnolent again -He is able to answer questions appropriately and to maintain airway despite sedation -Willie monitor in SDU -Anticipate spontaneous improvement -Could also be related to hypotension and/or sepsis  Chronic systolic heart failure -EF 15-20% on echo in 5/18 -Remarkably, patient appears to be compensated at this time -AICD noted in left chest -EKG with nonspecific changes that do not appear to be new; patient is not complaining of chest pain; and troponin negative x 1  Malnutrition -Albumin 2.6 -Nutrition consult  DVT prophylaxis: Lovenox Code Status: Full - per last admission Family Communication: None  present Disposition Plan:  Home once clinically improved Consults called: Nephrology called and Willie see patient in AM; Needs vascular surgery consult in AM Admission status: It is my clinical opinion that referral for OBSERVATION is reasonable and necessary in this patient based on the above information provided. The aforementioned taken together are felt to place the patient at high risk for further clinical deterioration. However it is anticipated that the patient  may be medically stable for discharge from the hospital within 24 to 48 hours.    Jonah Blue MD [facility]  If 7PM-7AM, please contact night-coverage [email or web address] Password [email or web address]  02/11/2017, 9:59 PM

## 2017-02-11 NOTE — ED Provider Notes (Signed)
[facility] DEPT Provider Note   CSN: [id number] Arrival date & time: 02/11/17  1555     History   Chief Complaint Chief Complaint  Patient presents with  . Drug Overdose    HPI Willie Andrade is a 66 y.o. male.  HPI  66 year old man end-stage renal disease on dialysis, peripheral vascular disease, smoker, hypertension, hyperlipidemia, chronic systolic heart failure status post recent left femoral peroneal bypass graft 627 by Dr. early presents today with altered mental status. He states that the pain in his foot was worse and he took 4 Percocet today. Initial report from EMS is that this was an overdose. He received some Narcan prehospital and was more awake.  Pupils reported pinpoint. Patient's sister is also in the room who is the same house. She reports that the left foot has become more swollen. The toes are discolored and appeared to be worsening to her and the patient. He does not report any headache or head injury. He denies any cough, chest pain, shortness of breath, abdominal pain, nausea, vomiting, or diarrhea. He states his left foot is very painful. He states that there is swelling in the left groin. He reports he was seen in the vascular surgery office on Friday and states he had some fluid drawn from this area. Note from Dr. Durwin Willie on IV states that he was noted to have a mucocele of the left groin 47 mL of clear yellow fluid were aspirated. Reports that the swelling resolved. He reports that his current dialysis access is in the left forearm.  Past Medical History:  Diagnosis Date  . Chronic systolic CHF (congestive heart failure), NYHA class 2 (HCC)   . ESRD (end stage renal disease) on dialysis (HCC)   . Hepatitis B   . HTN (hypertension)   . Hyperlipidemia   . 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 Tobacco abuse     Patient Active Problem List   Diagnosis Date Noted  . General weakness 01/26/2017  . Wound  dehiscence 01/26/2017  . Hypotension   . Cellulitis of left foot 01/17/2017  . Cellulitis 01/17/2017  . Gangrene of toe of left foot (HCC)   . Pre-syncope 12/21/2016  . Chronic systolic heart failure (HCC) 10/11/2016  . 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 Pacemaker 10/10/2016  . ESRD (end stage renal disease) (HCC) 02/14/2016    Past Surgical History:  Procedure Laterality Date  . ABDOMINAL AORTOGRAM W/LOWER EXTREMITY N/A 01/22/2017   Procedure: Abdominal Aortogram w/Lower Extremity;  Surgeon: [name], MD;  [facility];  Service: Cardiovascular;  Laterality: N/A;  . BIV ICD GENERATOR CHANGEOUT N/A 11/28/2016   Procedure: BiV ICD [employer]; St Jude Surgeon: Will Jorja Loa, MD;  Location: [facility];  Service: Cardiovascular;  Laterality: N/A;  . BYPASS GRAFT FEMORAL-PERONEAL Left 01/24/2017   Procedure: Left Leg  FEMORAL-PERONEAL Bypass Graft;  Surgeon: [name], MD;  Location: [facility] OR;  Service: Vascular;  Laterality: Left;       Home Medications    Prior to Admission medications   Medication Sig Start Date End Date Taking? Authorizing Provider  calcium acetate (PHOSLO) 667 MG capsule Take 3 capsules (2,001 mg total) by mouth 3 (three) times daily with meals. 12/23/16   Clydia Llano, MD  cinacalcet (SENSIPAR) 30 MG tablet Take 6 tablets (180 mg total) by mouth every Tuesday, Thursday,  and Saturday at 6 PM. 01/27/17   Edsel Petrin, DO  gabapentin (NEURONTIN) 100 MG capsule Take 100 mg by mouth 2 (two) times daily.     [provider]  lidocaine-prilocaine (EMLA) cream Apply 1 application topically Every Tuesday,Thursday,and Saturday with dialysis.    [provider]  midodrine (PROAMATINE) 5 MG tablet Take 2 tablets (10 mg total) by mouth 3 (three) times daily with meals. 01/28/17   Osvaldo Shipper, MD  multivitamin (RENA-VIT) TABS tablet Take 1 tablet  by mouth at bedtime. 01/26/17   Mikhail, Nita Sells, DO  Nutritional Supplements (FEEDING SUPPLEMENT, NEPRO CARB STEADY,) LIQD Take 237 mLs by mouth 2 (two) times daily between meals. 01/27/17   Edsel Petrin, DO  oxyCODONE-acetaminophen (PERCOCET/ROXICET) 5-325 MG tablet Take 1 tablet by mouth every 6 (six) hours as needed for moderate pain. 01/30/17   Charlynne Pander, MD  oxyCODONE-acetaminophen (PERCOCET/ROXICET) 5-325 MG tablet Take 1-2 tablets by mouth every 6 (six) hours as needed for severe pain. 02/08/17   Chuck Hint, MD  polyethylene glycol [facility] / Ethelene Hal) packet Take 17 g by mouth daily as needed for mild constipation. 01/28/17   Osvaldo Shipper, MD  sevelamer carbonate (RENVELA) 800 MG tablet Take 4 tablets (3,200 mg total) by mouth 3 (three) times daily with meals. 01/26/17   Edsel Petrin, DO    Family History Family History  Problem Relation Age of Onset  . Diabetes Mother   . Gout Mother   . Stroke Father   . Gout Maternal Grandmother   . Gout Sister   . Asthma Brother     Social History Social History  Substance Use Topics  . Smoking status: Current Every Day Smoker    Packs/day: 0.00    Types: Cigarettes  . Smokeless tobacco: Never Used  . Alcohol use No     Allergies   Patient has no known allergies.   Review of Systems Review of Systems  All other systems reviewed and are negative.    Physical Exam Updated Vital Signs BP 93/60   Pulse 67   Temp 98 F (36.7 C) (Rectal)   Resp 15   Ht 1.88 m (6\' 2" )   Wt 72.6 kg (160 lb)   SpO2 95%   BMI 20.54 kg/m   Physical Exam  Constitutional: He is oriented to person, place, and time. He appears well-developed. No distress.  Chronically ill-appearing male  HENT:  Head: Normocephalic and atraumatic.  Right Ear: External ear normal.  Left Ear: External ear normal.  Mucous membranes are dry  Eyes: Conjunctivae and EOM are normal.  Pupils are small but equal and reactive  Neck: Normal  range of motion.  Cardiovascular: Normal rate and regular rhythm.   Pulmonary/Chest: Effort normal and breath sounds normal.  Device noted right chest wall without tenderness, erythema, or induration  Abdominal: Soft. Bowel sounds are normal.  Musculoskeletal: Normal range of motion.  Left lower extremity with Ace wrap in place around cath. Groin is tender with swollen area consistent with lymphocele. Left lower leg with healing wounds. Left foot is extremely swollen and tender to palpation tenderness to through 5 appear gangrenous no obvious discharges noted  Neurological: He is alert and oriented to person, place, and time.  Skin: Capillary refill takes less than 2 seconds.  Nursing note and vitals reviewed.    ED Treatments / Results  Labs (all labs ordered are listed, but only abnormal results are displayed) Labs Reviewed  CULTURE, BLOOD (ROUTINE X 2)  CULTURE,  BLOOD (ROUTINE X 2)  RAPID URINE DRUG SCREEN, HOSP PERFORMED  CBC WITH DIFFERENTIAL/PLATELET  COMPREHENSIVE METABOLIC PANEL  BRAIN NATRIURETIC PEPTIDE  I-STAT CG4 LACTIC ACID, ED  I-STAT TROPOININ, ED    EKG  EKG Interpretation  Date/Time:  Sunday February 11 2017 16:48:25 EDT Ventricular Rate:  75 PR Interval:    QRS Duration: 185 QT Interval:  498 QTC Calculation: 557 R Axis:   -175 Text Interpretation:  Sinus rhythm Nonspecific intraventricular conduction delay Lateral infarct, recent Probable anteroseptal infarct, recent Confirmed by Margarita Grizzle [phone]) on 02/11/2017 6:15:25 PM       Radiology Ct Head Wo Contrast  Result Date: 02/11/2017 CLINICAL DATA:  Overdose, altered mental status, revised with Narcan, vomiting EXAM: CT HEAD WITHOUT CONTRAST TECHNIQUE: Contiguous axial images were obtained from the base of the skull through the vertex without intravenous contrast. Sagittal and coronal MPR images reconstructed from axial data set. COMPARISON:  08/12/2016 FINDINGS: Brain: Generalized atrophy. Normal  ventricular morphology. No midline shift or mass effect. Normal appearance of brain parenchyma. No intracranial hemorrhage, mass lesion, evidence of acute infarction, or extra-axial fluid collection. Vascular: Atherosclerotic calcification of internal carotid and vertebral arteries at skullbase Skull: Intact Sinuses/Orbits: Clear Other: N/A IMPRESSION: No acute intracranial abnormalities. Electronically Signed   By: Ulyses Southward M.D.   On: 02/11/2017 16:47    Procedures Procedures (including critical care time)  Medications Ordered in ED Medications  sodium chloride 0.9 % bolus 500 mL (500 mLs Intravenous New Bag/Given 02/11/17 1641)     Initial Impression / Assessment and Plan / ED Course  I have reviewed the triage vital signs and the nursing notes.  Pertinent labs & imaging results that were available during my care of the patient were reviewed by me and considered in my medical decision making (see chart for details).     1- ams- appears c.w. od narcotic- patient improved after narcan.  However, cannot rule out hypotension, infection,  2- gangrenous toes left foot- patient s/p revascularization surgery , but toes continue be necrotic-  Family thinks worsening,.  Possible source of infection. Patietn treated here with vanc and zosyn.  I was unable to palpate dp pulse left and nursing is attempting doppler .  3- esrd- on dialysis.  Patient went to dialysis yesterday but only completed 3 hours.  4- hypotension- patient with recent similar presentation and midodrine was increased.  Patient reports that his dry weight was also increased.  Discussed with Dr. Kevan Ny and she will placed in observation. Final Clinical Impressions(s) / ED Diagnoses   Final diagnoses:  Narcotic overdose, accidental or unintentional, initial encounter  Hypotension, unspecified hypotension type    New Prescriptions New Prescriptions   No medications on file     Margarita Grizzle, MD 02/11/17 [phone]

## 2017-02-11 NOTE — ED Triage Notes (Signed)
Pt arrived via [facility] from home c/o overdose.  Per EMS  pt has pinpoint pupils, given 0.5mg  Narcan and came up swinging and vomiting.  Dialysis yesterday Tuesday Thursday Saturday. Recent left leg surgery taking oxycodone as prescribed per sister, bottle of medications at bedside.  CBG 171

## 2017-02-11 NOTE — ED Notes (Signed)
Pt brought by EMS placed on stretcher in hallway until room becomes available.

## 2017-02-11 NOTE — ED Notes (Signed)
Admitting MD at bedside.

## 2017-02-12 ENCOUNTER — Ambulatory Visit: Payer: Medicare Other | Admitting: Cardiology

## 2017-02-12 DIAGNOSIS — E8889 Other specified metabolic disorders: Secondary | ICD-10-CM | POA: Diagnosis present

## 2017-02-12 DIAGNOSIS — I951 Orthostatic hypotension: Secondary | ICD-10-CM

## 2017-02-12 DIAGNOSIS — I5022 Chronic systolic (congestive) heart failure: Secondary | ICD-10-CM | POA: Diagnosis present

## 2017-02-12 DIAGNOSIS — I959 Hypotension, unspecified: Secondary | ICD-10-CM | POA: Diagnosis not present

## 2017-02-12 DIAGNOSIS — E43 Unspecified severe protein-calorie malnutrition: Secondary | ICD-10-CM

## 2017-02-12 DIAGNOSIS — Z6820 Body mass index (BMI) 20.0-20.9, adult: Secondary | ICD-10-CM | POA: Diagnosis not present

## 2017-02-12 DIAGNOSIS — I96 Gangrene, not elsewhere classified: Secondary | ICD-10-CM | POA: Diagnosis present

## 2017-02-12 DIAGNOSIS — R4182 Altered mental status, unspecified: Secondary | ICD-10-CM | POA: Diagnosis present

## 2017-02-12 DIAGNOSIS — T402X1A Poisoning by other opioids, accidental (unintentional), initial encounter: Secondary | ICD-10-CM | POA: Diagnosis present

## 2017-02-12 DIAGNOSIS — N186 End stage renal disease: Secondary | ICD-10-CM | POA: Diagnosis present

## 2017-02-12 DIAGNOSIS — Z79899 Other long term (current) drug therapy: Secondary | ICD-10-CM | POA: Diagnosis not present

## 2017-02-12 DIAGNOSIS — I132 Hypertensive heart and chronic kidney disease with heart failure and with stage 5 chronic kidney disease, or end stage renal disease: Secondary | ICD-10-CM | POA: Diagnosis present

## 2017-02-12 DIAGNOSIS — Z95810 Presence of automatic (implantable) cardiac defibrillator: Secondary | ICD-10-CM | POA: Diagnosis not present

## 2017-02-12 DIAGNOSIS — G92 Toxic encephalopathy: Secondary | ICD-10-CM | POA: Diagnosis present

## 2017-02-12 DIAGNOSIS — T402X5A Adverse effect of other opioids, initial encounter: Secondary | ICD-10-CM | POA: Diagnosis present

## 2017-02-12 DIAGNOSIS — T40601A Poisoning by unspecified narcotics, accidental (unintentional), initial encounter: Secondary | ICD-10-CM

## 2017-02-12 DIAGNOSIS — Z992 Dependence on renal dialysis: Secondary | ICD-10-CM | POA: Diagnosis not present

## 2017-02-12 DIAGNOSIS — I898 Other specified noninfective disorders of lymphatic vessels and lymph nodes: Secondary | ICD-10-CM | POA: Diagnosis present

## 2017-02-12 DIAGNOSIS — A419 Sepsis, unspecified organism: Secondary | ICD-10-CM | POA: Diagnosis not present

## 2017-02-12 DIAGNOSIS — F17210 Nicotine dependence, cigarettes, uncomplicated: Secondary | ICD-10-CM | POA: Diagnosis present

## 2017-02-12 DIAGNOSIS — E785 Hyperlipidemia, unspecified: Secondary | ICD-10-CM | POA: Diagnosis present

## 2017-02-12 DIAGNOSIS — N2581 Secondary hyperparathyroidism of renal origin: Secondary | ICD-10-CM | POA: Diagnosis present

## 2017-02-12 LAB — BLOOD CULTURE ID PANEL (REFLEXED)
ACINETOBACTER BAUMANNII: NOT DETECTED
CANDIDA ALBICANS: NOT DETECTED
CANDIDA GLABRATA: NOT DETECTED
Candida krusei: NOT DETECTED
Candida parapsilosis: NOT DETECTED
Candida tropicalis: NOT DETECTED
ENTEROBACTER CLOACAE COMPLEX: NOT DETECTED
ENTEROBACTERIACEAE SPECIES: NOT DETECTED
Enterococcus species: NOT DETECTED
Escherichia coli: NOT DETECTED
HAEMOPHILUS INFLUENZAE: NOT DETECTED
KLEBSIELLA PNEUMONIAE: NOT DETECTED
Klebsiella oxytoca: NOT DETECTED
LISTERIA MONOCYTOGENES: NOT DETECTED
METHICILLIN RESISTANCE: DETECTED — AB
NEISSERIA MENINGITIDIS: NOT DETECTED
PSEUDOMONAS AERUGINOSA: NOT DETECTED
Proteus species: NOT DETECTED
STREPTOCOCCUS AGALACTIAE: NOT DETECTED
STREPTOCOCCUS PNEUMONIAE: NOT DETECTED
STREPTOCOCCUS PYOGENES: NOT DETECTED
STREPTOCOCCUS SPECIES: NOT DETECTED
Serratia marcescens: NOT DETECTED
Staphylococcus aureus (BCID): NOT DETECTED
Staphylococcus species: DETECTED — AB

## 2017-02-12 LAB — GLUCOSE, CAPILLARY
Glucose-Capillary: 48 mg/dL — ABNORMAL LOW (ref 65–99)
Glucose-Capillary: 70 mg/dL (ref 65–99)

## 2017-02-12 LAB — CBC
HEMATOCRIT: 42.4 % (ref 39.0–52.0)
HEMOGLOBIN: 12.9 g/dL — AB (ref 13.0–17.0)
MCH: 27.7 pg (ref 26.0–34.0)
MCHC: 30.4 g/dL (ref 30.0–36.0)
MCV: 91.2 fL (ref 78.0–100.0)
Platelets: 165 10*3/uL (ref 150–400)
RBC: 4.65 MIL/uL (ref 4.22–5.81)
RDW: 19.7 % — ABNORMAL HIGH (ref 11.5–15.5)
WBC: 8.6 10*3/uL (ref 4.0–10.5)

## 2017-02-12 LAB — BASIC METABOLIC PANEL
ANION GAP: 14 (ref 5–15)
BUN: 24 mg/dL — ABNORMAL HIGH (ref 6–20)
CALCIUM: 6.5 mg/dL — AB (ref 8.9–10.3)
CO2: 26 mmol/L (ref 22–32)
Chloride: 100 mmol/L — ABNORMAL LOW (ref 101–111)
Creatinine, Ser: 6.29 mg/dL — ABNORMAL HIGH (ref 0.61–1.24)
GFR, EST AFRICAN AMERICAN: 10 mL/min — AB (ref >60)
GFR, EST NON AFRICAN AMERICAN: 8 mL/min — AB (ref >60)
Glucose, Bld: 78 mg/dL (ref 65–99)
POTASSIUM: 5.1 mmol/L (ref 3.5–5.1)
Sodium: 140 mmol/L (ref 135–145)

## 2017-02-12 LAB — LACTIC ACID, PLASMA: LACTIC ACID, VENOUS: 2.3 mmol/L — AB (ref 0.5–1.9)

## 2017-02-12 MED ORDER — VANCOMYCIN HCL IN DEXTROSE 750-5 MG/150ML-% IV SOLN
750.0000 mg | INTRAVENOUS | Status: DC
  Administered 2017-02-13: 750 mg via INTRAVENOUS
  Filled 2017-02-12: qty 150

## 2017-02-12 MED ORDER — RENA-VITE PO TABS
1.0000 | ORAL_TABLET | Freq: Every day | ORAL | Status: DC
Start: 2017-02-13 — End: 2017-02-13

## 2017-02-12 MED ORDER — SODIUM CHLORIDE 0.9 % IV BOLUS (SEPSIS)
250.0000 mL | Freq: Once | INTRAVENOUS | Status: AC
  Administered 2017-02-12: 250 mL via INTRAVENOUS

## 2017-02-12 MED ORDER — CALCITRIOL 0.25 MCG PO CAPS
1.0000 ug | ORAL_CAPSULE | ORAL | Status: DC
  Administered 2017-02-13: 1 ug via ORAL
  Filled 2017-02-12: qty 4

## 2017-02-12 MED ORDER — SODIUM CHLORIDE 0.9 % IV BOLUS (SEPSIS): 250.0000 mL | Freq: Once | INTRAVENOUS | Status: DC

## 2017-02-12 NOTE — Consult Note (Signed)
[facility] Renal Consultation Note    Indication for Consultation:  Management of ESRD/hemodialysis; anemia, hypertension/volume and secondary hyperparathyroidism PCP:  HPI: Willie Andrade is a 66 y.o. male with ESRD on HD, HTN, chronic systolic CHF s/p AICD EF 15-20% (Echo 11/2016), Hepatitis B, chronic hypotension on midodrine.  Recent [facility] admit 12/2016 with left toe gangrene s/p L femoral to peroneal bypass per [facility]VS 01/24/17. Prior admission in 11/2016 with presyncope/dizziness.  Presented to [facility] ED yesterday via EMS following syncopal episode at home. Was outside talking with a friend and the next thing he remembers is waking up in the ambulance. ED course significant for hypotension and AMS. Noted concern for narcotic overdose (patient takes Percocet for foot pain) and Narcan given with some improvement in mental status. SBPs 70s-80s on admission. Negative CXR and head CT. Labs showing for K 5.1 Lactic acid 2.3  Procalcitonin 0.81 WBC 8.6.  Admitted under observation status for further evaluation of hypotension and concern for sepsis. Has received 1L IVF bolus. Blood cultures drawn and empiric antibiotics started.  Seen in room. BP 76/56 HR 76. He is alert and oriented x3. He remembers yesterday's events and knows why he is in the hospital. He reports worsening pain in both feet, mostly in the bottoms of his feet. Percocet is the only thing that helps the pain.  Dialyzes at [facility] T[facility]. Last HD was Saturday 7/14 for 3 hours. EDW raised from 68 to 72kg after last discharge. Usually meeting EDW with SBPs 100-120s.    Past Medical History:  Diagnosis Date  . Chronic systolic CHF (congestive heart failure), NYHA class 2 (HCC)   . ESRD (end stage renal disease) on dialysis (HCC)   . Hepatitis B   . HTN (hypertension)   . Hyperlipidemia   . 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 Tobacco abuse    Past  Surgical History:  Procedure Laterality Date  . ABDOMINAL AORTOGRAM W/LOWER EXTREMITY N/A 01/22/2017   Procedure: Abdominal Aortogram w/Lower Extremity;  Surgeon: [name], MD;  [facility];  Service: Cardiovascular;  Laterality: N/A;  . BIV ICD GENERATOR CHANGEOUT N/A 11/28/2016   Procedure: BiV ICD [employer]; St Jude Surgeon: Will Jorja Loa, MD;  Location: [facility];  Service: Cardiovascular;  Laterality: N/A;  . BYPASS GRAFT FEMORAL-PERONEAL Left 01/24/2017   Procedure: Left Leg  FEMORAL-PERONEAL Bypass Graft;  Surgeon: [name], MD;  Location: [facility] OR;  Service: Vascular;  Laterality: Left;   Family History  Problem Relation Age of Onset  . Diabetes Mother   . Gout Mother   . Stroke Father   . Gout Maternal Grandmother   . Gout Sister   . Asthma Brother    Social History:  reports that he has been smoking Cigarettes.  He has been smoking about 1.00 pack per day. He has never used smokeless tobacco. He reports that he does not drink alcohol or use drugs. No Known Allergies Prior to Admission medications   Medication Sig Start Date End Date Taking? Authorizing Provider  B Complex-C-Zn-Folic Acid (DIALYVITE 800/ZINC PO) Take 1 tablet by mouth daily.   Yes [provider]  calcium acetate (PHOSLO) 667 MG capsule Take 3 capsules (2,001 mg total) by mouth 3 (three) times daily with meals. 12/23/16  Yes Clydia Llano, MD  diphenhydrAMINE (BENADRYL) 25 MG tablet Take 25 mg by mouth every 6 (six) hours as needed for  itching.   Yes [provider]  gabapentin (NEURONTIN) 100 MG capsule Take 100 mg by mouth 2 (two) times daily.    Yes [provider]  lidocaine-prilocaine (EMLA) cream Apply 1 application topically See admin instructions. Apply topically to dialysis access every Tuesday, Thursday, Saturday prior to hemodialysis   Yes [provider]  midodrine (PROAMATINE) 5 MG tablet Take 2 tablets (10 mg total) by  mouth 3 (three) times daily with meals. 01/28/17  Yes Osvaldo Shipper, MD  Nutritional Supplements (FEEDING SUPPLEMENT, NEPRO CARB STEADY,) LIQD Take 237 mLs by mouth 2 (two) times daily between meals. 01/27/17  Yes Mikhail, [place], DO  oxyCODONE-acetaminophen (PERCOCET/ROXICET) 5-325 MG tablet Take 1-2 tablets by mouth every 6 (six) hours as needed for severe pain. 02/08/17  Yes Chuck Hint, MD  polyethylene glycol [facility] / Ethelene Hal) packet Take 17 g by mouth daily as needed for mild constipation. 01/28/17  Yes Osvaldo Shipper, MD  Salicylic Acid 2 % CREA Apply 1 application topically daily as needed (itching).   Yes [provider]  cinacalcet (SENSIPAR) 30 MG tablet Take 6 tablets (180 mg total) by mouth every Tuesday, Thursday, and Saturday at 6 PM. 01/27/17   Edsel Petrin, DO  sevelamer carbonate (RENVELA) 800 MG tablet Take 4 tablets (3,200 mg total) by mouth 3 (three) times daily with meals. Patient not taking: Reported on 02/11/2017 01/26/17   Edsel Petrin, DO   Current Facility-Administered Medications  Medication Dose Route Frequency Provider Last Rate Last Dose  . acetaminophen (TYLENOL) tablet 650 mg  650 mg Oral Q6H PRN Jonah Blue, MD       Or  . acetaminophen (TYLENOL) suppository 650 mg  650 mg Rectal Q6H PRN Jonah Blue, MD      . calcium acetate (PHOSLO) capsule 2,001 mg  2,001 mg Oral TID WC Jonah Blue, MD   2,001 mg at 02/12/17 1610  . calcium carbonate (dosed in mg elemental calcium) suspension 500 mg of elemental calcium  500 mg of elemental calcium Oral Q6H PRN Jonah Blue, MD      . camphor-menthol [facility]) lotion 1 application  1 application Topical Q8H PRN Jonah Blue, MD       And  . hydrOXYzine (ATARAX/VISTARIL) tablet 25 mg  25 mg Oral Q8H PRN Jonah Blue, MD   25 mg at 02/11/17 2350  . [START ON 02/13/2017] cinacalcet (SENSIPAR) tablet 180 mg  180 mg Oral Q Milderd Meager, MD      . diphenhydrAMINE  (BENADRYL) capsule 25 mg  25 mg Oral Q6H PRN Jonah Blue, MD      . docusate sodium (COLACE) capsule 100 mg  100 mg Oral BID Jonah Blue, MD   100 mg at 02/12/17 0834  . docusate sodium (ENEMEEZ) enema 283 mg  1 enema Rectal PRN Jonah Blue, MD      . enoxaparin (LOVENOX) injection 30 mg  30 mg Subcutaneous Q24H Jonah Blue, MD   30 mg at 02/11/17 2315  . feeding supplement (NEPRO CARB STEADY) liquid 237 mL  237 mL Oral BID BM Jonah Blue, MD   237 mL at 02/12/17 1000  . gabapentin (NEURONTIN) capsule 100 mg  100 mg Oral BID Jonah Blue, MD   100 mg at 02/12/17 0834  . midodrine (PROAMATINE) tablet 10 mg  10 mg Oral TID WC Jonah Blue, MD   10 mg at 02/12/17 9604  . [START ON 02/13/2017] multivitamin (RENA-VIT) tablet 1 tablet  1 tablet Oral QHS Tyrone Nine,  MD      . ondansetron (ZOFRAN) tablet 4 mg  4 mg Oral Q6H PRN Jonah Blue, MD       Or  . ondansetron [facility]) injection 4 mg  4 mg Intravenous Q6H PRN Jonah Blue, MD      . oxyCODONE-acetaminophen (PERCOCET/ROXICET) 5-325 MG per tablet 1-2 tablet  1-2 tablet Oral Q6H PRN Jonah Blue, MD      . piperacillin-tazobactam (ZOSYN) IVPB 3.375 g  3.375 g Intravenous Q12H Margarita Grizzle, MD 12.5 mL/hr at 02/12/17 0834 3.375 g at 02/12/17 0834  . polyethylene glycol (MIRALAX / GLYCOLAX) packet 17 g  17 g Oral Daily PRN Jonah Blue, MD      . sodium chloride 0.9 % bolus 250 mL  250 mL Intravenous Once PRN Jonah Blue, MD      . sodium chloride 0.9 % bolus 250 mL  250 mL Intravenous Once Schorr, Roma Kayser, NP 250 mL/hr at 02/12/17 0519 250 mL at 02/12/17 0519  . sodium chloride flush (NS) 0.9 % injection 3 mL  3 mL Intravenous Q12H Jonah Blue, MD   3 mL at 02/11/17 2350  . sorbitol 70 % solution 30 mL  30 mL Oral PRN Jonah Blue, MD      . vancomycin (VANCOCIN) IVPB 750 mg/150 ml premix  750 mg Intravenous Q dialysis Margarita Grizzle, MD      . zolpidem (AMBIEN) tablet 5 mg  5 mg Oral QHS PRN  Jonah Blue, MD        ROS: As per HPI otherwise negative.  Physical Exam: Vitals:   02/12/17 0000 02/12/17 0320 02/12/17 0700 02/12/17 0800  BP: 102/76 (!) 86/63 (!) 88/65 (!) 90/58  Pulse:  71    Resp: (!) 32 14  (!) 33  Temp:  97.8 F (36.6 C) 98.1 F (36.7 C)   TempSrc:  Axillary Oral   SpO2:      Weight:      Height:         General: Frail elderly AAM lying in bed NAD  Head: NCAT sclera not icteric MMM Neck: Supple. No JVD No masses Lungs: CTAB Breathing is unlabored. Heart: RRR with S1 S2 Abdomen: soft NT + BS Lower extremities: bilat feet with dry gangrenous changes, no open wounds, mild edema, feet cool  Neuro: A & O  X 3. Moves all extremities spontaneously. Psych:  Responds to questions appropriately with a normal affect. Dialysis Access: LUE AVF +bruit   Labs: Basic Metabolic Panel:  Recent Labs Lab 02/11/17 1651 02/12/17 0208  NA 139 140  K 3.2* 5.1  CL 98* 100*  CO2 26 26  GLUCOSE 102* 78  BUN 24* 24*  CREATININE 6.62* 6.29*  CALCIUM 6.7* 6.5*   Liver Function Tests:  Recent Labs Lab 02/11/17 1651  AST 20  ALT 7*  ALKPHOS 113  BILITOT 0.6  PROT 6.3*  ALBUMIN 2.6*   No results for input(s): LIPASE, AMYLASE in the last 168 hours. No results for input(s): AMMONIA in the last 168 hours. CBC:  Recent Labs Lab 02/11/17 1651 02/12/17 0545  WBC 11.1* 8.6  NEUTROABS 9.6*  --   HGB 11.4* 12.9*  HCT 36.9* 42.4  MCV 90.4 91.2  PLT 188 165   Cardiac Enzymes: No results for input(s): CKTOTAL, CKMB, CKMBINDEX, TROPONINI in the last 168 hours. CBG:  Recent Labs Lab 02/12/17 0748 02/12/17 0824  GLUCAP 48* 70   Iron Studies: No results for input(s): IRON, TIBC, TRANSFERRIN, FERRITIN in the last  72 hours. Studies/Results: Ct Head Wo Contrast  Result Date: 02/11/2017 CLINICAL DATA:  Overdose, altered mental status, revised with Narcan, vomiting EXAM: CT HEAD WITHOUT CONTRAST TECHNIQUE: Contiguous axial images were obtained from the  base of the skull through the vertex without intravenous contrast. Sagittal and coronal MPR images reconstructed from axial data set. COMPARISON:  08/12/2016 FINDINGS: Brain: Generalized atrophy. Normal ventricular morphology. No midline shift or mass effect. Normal appearance of brain parenchyma. No intracranial hemorrhage, mass lesion, evidence of acute infarction, or extra-axial fluid collection. Vascular: Atherosclerotic calcification of internal carotid and vertebral arteries at skullbase Skull: Intact Sinuses/Orbits: Clear Other: N/A IMPRESSION: No acute intracranial abnormalities. Electronically Signed   By: Ulyses Southward M.D.   On: 02/11/2017 16:47   Dg Chest Port 1 View  Result Date: 02/11/2017 CLINICAL DATA:  Recent drug overdose EXAM: PORTABLE CHEST 1 VIEW COMPARISON:  12/22/2016 FINDINGS: Cardiac shadow remains enlarged. A defibrillator is again seen and stable. Aortic calcifications are again noted. No focal infiltrate or sizable effusion is seen. No acute bony abnormality is noted. IMPRESSION: No acute abnormality noted. Electronically Signed   By: Alcide Clever M.D.   On: 02/11/2017 17:14    Dialysis Orders:  [facility] 3.75h 180F BFR 400/800 2K/2Ca EDW 72 kg  -Heparin Bolus 1400 -Calcitriol PO q HD -Mircera 75 mcg q 2 weeks  (7/3) -Venofer 50 mg IV q week (7/12) -Sensipar 180 mg PO q HD    Assessment/Plan: 1. Acute on chronic hypotension/?sepsis -  SBPs 70-80s on admission with elevated lactic acid. Negative CXR/Head CT.  Blood culture +GPC this afternoon - ID pending. On empiric Zosyn/Vanc - per primary 2. L foot gangrene s/p fem-peroneal bypass 01/24/17 - seen by VVS no further intervention planned this admission ?source #1  3.  ESRD -  TTS - Plan HD tomorrow on schedule if BP allows.  4.  Hypotension/volume  - Chronic hypotension on midodrine TID/Volume stable - even UF  5.  Anemia  - Hgb 12.9 Hold ESA  and follow  6.  Metabolic bone disease -   Ca 6.5  - Cont Calcitriol/Ca  acetate binder/Sensipar  7.  Nutrition - Renal diet/vitamins  8. CHF AICD EF 15-20%   Jeriah Skufca Susann Givens PA-C [facility] Pager [phone] 02/12/2017, 11:33 AM

## 2017-02-12 NOTE — Progress Notes (Signed)
Notified NP of elevated Lactic acid 2.3 and low sbp 70-80's. Orders received and I will continue to monitor.

## 2017-02-12 NOTE — Progress Notes (Signed)
Initial Nutrition Assessment  DOCUMENTATION CODES:   Severe malnutrition in context of chronic illness  INTERVENTION:  - Continue Nepro Shake po BID, each supplement provides 425 kcal and 19 grams protein. - Continue to encourage PO intakes of meals and supplements. - RD will continue to monitor for additional nutrition-related needs, including need for diet-related education.   NUTRITION DIAGNOSIS:   Malnutrition (severe) related to chronic illness (ESRD on HD) as evidenced by severe depletion of muscle mass, severe depletion of body fat, moderate depletion of body fat, moderate depletions of muscle mass.  GOAL:   Patient will meet greater than or equal to 90% of their needs  MONITOR:   PO intake, Supplement acceptance, Weight trends, Labs, Skin, I & O's  REASON FOR ASSESSMENT:   Consult Assessment of nutrition requirement/status  ASSESSMENT:   66 y.o. male with medical history significant of end-stage renal disease on hemodialysis, chronic systolic CHF with ICD, chronic hypotension, and anemia presenting with altered mental status.  He reports that he was sitting in the yard with a friend.  "I told him it was too hot to go out but like a dumbass I went out there and next thing I woke up in the yard."  He thinks he took too much of his medicine.  He doesn't know how many Percocets, "if I knew all that I wouldn't be up here would I?"    Pt seen for consult. BMI indicates normal weight. No intakes documented since admission. Pt reports 100% completion of scrambled eggs, grits, and toast; he is saving Frosted Flakes and apple juice for later. Pt reports very good appetite now and PTA and reports he never has an issue with appetite. Pt without teeth and does not have dentures or partial. At first report sounds as if he had teeth extracted for cardiac surgery but then he states teeth fell out. He denies problems with chewing and states that he finds a way to chew even raw fruits and  vegetables and that he uses fork and knife to cut items into very small pieces if needed. Pt denies swallowing difficulties.  He has Nepro Shake ordered BID He reports he goes to HD on Tuesdays/Thursdays/Saturdays and drinks a Nepro each time he is there. He was unaware that it would be okay to drink this supplement on the days between HD also and plans to start doing this as he greatly enjoys the supplement.  Physical assessment shows severe fat wasting to upper arm and trunk, severe muscle wasting to shoulder area and thigh. Mild edema to BLE. Per chart review, weight has fluctuated (149-160 lbs) since March. Most recently, pt has gained 6 lbs since 6/29.  Medications reviewed; Phoslo TID,180 mg Sensipar on days of HD, 100 mg Colace BID, 1 tablet Renavit/day, 40 mEq KCl x1 dose yesterday. Labs reviewed; Cl: 100 mmol/L, BUN: 24 mg/dL, creatinine: 1.61 mg/dL, Ca: 6.5 mg/dL, GFR: 10 mL/min.    Diet Order:  Diet renal with fluid restriction Fluid restriction: 1200 mL Fluid; Room service appropriate? Yes; Fluid consistency: Thin  Skin:  Wound (see comment) (Necrotic tissue between L toes; L leg and L groin incisions from 02/11/17)  Last BM:  PTA/unknown  Height:   Ht Readings from Last 1 Encounters:  02/11/17 6\' 2"  (1.88 m)    Weight:   Wt Readings from Last 1 Encounters:  02/11/17 159 lb 9.8 oz (72.4 kg)    Ideal Body Weight:  86.36 kg  BMI:  Body mass index is 20.49  kg/m.  Estimated Nutritional Needs:   Kcal:  2175-2390 (30-33 kcal/kg)  Protein:  110-125 grams (1.5-1.7 grams/kg)  Fluid:  1.2-1.5 L/day  EDUCATION NEEDS:   No education needs identified at this time    Trenton Gammon, MS, RD, LDN, CNSC Inpatient Clinical Dietitian Pager # [phone] After hours/weekend pager # [phone]

## 2017-02-12 NOTE — Consult Note (Signed)
Patient name: Willie Andrade MRN: [id number] DOB: 1951/01/14 Sex: male    HPI: Osha Buxbaum is a 66 y.o. male seen today for follow-up of his recent left femoral to peroneal bypass. He was admitted today with altered mental status. Multiple chronic medical conditions. He had presented to the hospital with dry gangrenous changes in the tips of toes of his left foot at the end of June. 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 He underwent a left femoral to peroneal bypass with in situ vein on 01/24/2017. Our office last week with lymphocele in his groin and this was aspirated. He reports having pain in both of his feet. He does not do a great deal of walking.  Current Facility-Administered Medications  Medication Dose Route Frequency Provider Last Rate Last Dose  . acetaminophen (TYLENOL) tablet 650 mg  650 mg Oral Q6H PRN Jonah Blue, MD       Or  . acetaminophen (TYLENOL) suppository 650 mg  650 mg Rectal Q6H PRN Jonah Blue, MD      . calcium acetate (PHOSLO) capsule 2,001 mg  2,001 mg Oral TID WC Jonah Blue, MD   2,001 mg at 02/12/17 8110  . calcium carbonate (dosed in mg elemental calcium) suspension 500 mg of elemental calcium  500 mg of elemental calcium Oral Q6H PRN Jonah Blue, MD      . camphor-menthol [facility]) lotion 1 application  1 application Topical Q8H PRN Jonah Blue, MD       And  . hydrOXYzine (ATARAX/VISTARIL) tablet 25 mg  25 mg Oral Q8H PRN Jonah Blue, MD   25 mg at 02/11/17 2350  . [START ON 02/13/2017] cinacalcet (SENSIPAR) tablet 180 mg  180 mg Oral Q Milderd Meager, MD      . diphenhydrAMINE (BENADRYL) capsule 25 mg  25 mg Oral Q6H PRN Jonah Blue, MD      . docusate sodium (COLACE) capsule 100 mg  100 mg Oral BID Jonah Blue, MD   100 mg at 02/12/17 0834  .  docusate sodium (ENEMEEZ) enema 283 mg  1 enema Rectal PRN Jonah Blue, MD      . enoxaparin (LOVENOX) injection 30 mg  30 mg Subcutaneous Q24H Jonah Blue, MD   30 mg at 02/11/17 2315  . feeding supplement (NEPRO CARB STEADY) liquid 237 mL  237 mL Oral BID BM Jonah Blue, MD   237 mL at 02/12/17 1000  . gabapentin (NEURONTIN) capsule 100 mg  100 mg Oral BID Jonah Blue, MD   100 mg at 02/12/17 0834  . midodrine (PROAMATINE) tablet 10 mg  10 mg Oral TID WC Jonah Blue, MD   10 mg at 02/12/17 3159  . [START ON 02/13/2017] multivitamin (RENA-VIT) tablet 1 tablet  1 tablet Oral QHS Tyrone Nine, MD      . ondansetron [facility]) tablet 4 mg  4 mg Oral Q6H PRN Jonah Blue, MD       Or  . ondansetron [facility]) injection 4 mg  4 mg Intravenous Q6H PRN Jonah Blue, MD      . oxyCODONE-acetaminophen (PERCOCET/ROXICET) 5-325 MG per tablet 1-2 tablet  1-2 tablet Oral Q6H PRN Jonah Blue, MD      . piperacillin-tazobactam (ZOSYN) IVPB 3.375 g  3.375 g Intravenous Q12H Margarita Grizzle, MD 12.5 mL/hr at 02/12/17 0834 3.375 g at 02/12/17 0834  . polyethylene glycol (MIRALAX / GLYCOLAX) packet 17  g  17 g Oral Daily PRN Jonah Blue, MD      . sodium chloride 0.9 % bolus 250 mL  250 mL Intravenous Once PRN Jonah Blue, MD      . sodium chloride 0.9 % bolus 250 mL  250 mL Intravenous Once Schorr, Roma Kayser, NP 250 mL/hr at 02/12/17 0519 250 mL at 02/12/17 0519  . sodium chloride flush (NS) 0.9 % injection 3 mL  3 mL Intravenous Q12H Jonah Blue, MD   3 mL at 02/11/17 2350  . sorbitol 70 % solution 30 mL  30 mL Oral PRN Jonah Blue, MD      . vancomycin (VANCOCIN) IVPB 750 mg/150 ml premix  750 mg Intravenous Q dialysis Ray, Duwayne Heck, MD      . zolpidem (AMBIEN) tablet 5 mg  5 mg Oral QHS PRN Jonah Blue, MD         PHYSICAL EXAM: Vitals:   02/12/17 0000 02/12/17 0320 02/12/17 0700 02/12/17 0800  BP: 102/76 (!) 86/63 (!) 88/65 (!) 90/58  Pulse:  71    Resp: (!)  32 14  (!) 33  Temp:  97.8 F (36.6 C) 98.1 F (36.7 C)   TempSrc:  Axillary Oral   SpO2:      Weight:      Height:        GENERAL: The patient is a well-nourished male, in no acute distress. The vital signs are documented above. His surgical incisions are healing well in his left groin thigh and calf. 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 This was aspirated 4 days ago. He has an easily palpable subcutaneous femoral to peroneal graft pulse. He does have Doppler flow in his left peroneal and posterior tibial although this is a dampened. Has dampened flow in his peroneal on the right.  MEDICAL ISSUES: Stable follow-up after his stem peroneal bypass. Continues to have dry gangrenous changes on the tips of his toes. No evidence of sepsis. Explained that he does not have any other revascularization options. Is scheduled to see podiatry on 02/14/2017 for recommendations regarding gangrenous changes to his toes. We will see him in the office for continued follow-up and understands that he may require re-aspiration of his lymphocele. Also explained that occasionally this requires surgical of debridement for resolution of lymphocele.   [name], MD FACS [facility] Tel [phone] Pager [phone]

## 2017-02-12 NOTE — Progress Notes (Signed)
  PHARMACY - PHYSICIAN COMMUNICATION CRITICAL VALUE ALERT - BLOOD CULTURE IDENTIFICATION (BCID)  Results for orders placed or performed during the hospital encounter of 02/11/17  Blood Culture ID Panel (Reflexed) (Collected: 02/11/2017  4:48 PM)  Result Value Ref Range   Enterococcus species NOT DETECTED NOT DETECTED   Listeria monocytogenes NOT DETECTED NOT DETECTED   Staphylococcus species DETECTED (A) NOT DETECTED   Staphylococcus aureus NOT DETECTED NOT DETECTED   Methicillin resistance DETECTED (A) NOT DETECTED   Streptococcus species NOT DETECTED NOT DETECTED   Streptococcus agalactiae NOT DETECTED NOT DETECTED   Streptococcus pneumoniae NOT DETECTED NOT DETECTED   Streptococcus pyogenes NOT DETECTED NOT DETECTED   Acinetobacter baumannii NOT DETECTED NOT DETECTED   Enterobacteriaceae species NOT DETECTED NOT DETECTED   Enterobacter cloacae complex NOT DETECTED NOT DETECTED   Escherichia coli NOT DETECTED NOT DETECTED   Klebsiella oxytoca NOT DETECTED NOT DETECTED   Klebsiella pneumoniae NOT DETECTED NOT DETECTED   Proteus species NOT DETECTED NOT DETECTED   Serratia marcescens NOT DETECTED NOT DETECTED   Haemophilus influenzae NOT DETECTED NOT DETECTED   Neisseria meningitidis NOT DETECTED NOT DETECTED   Pseudomonas aeruginosa NOT DETECTED NOT DETECTED   Candida albicans NOT DETECTED NOT DETECTED   Candida glabrata NOT DETECTED NOT DETECTED   Candida krusei NOT DETECTED NOT DETECTED   Candida parapsilosis NOT DETECTED NOT DETECTED   Candida tropicalis NOT DETECTED NOT DETECTED    Name of physician (or Provider) Contacted: Grunz  Changes to prescribed antibiotics required: None - likely a contaminant. Continues on Vancomycin + Zosyn for L-foot gangrene. If concerned that it may be a pathogen - consider repeating blood cultures.   Rolley Sims 02/12/2017  1:59 PM

## 2017-02-12 NOTE — Progress Notes (Signed)
PROGRESS NOTE  Willie Andrade  ZOX:[id number] DOB: 1951-04-17 DOA: 02/11/2017 PCP: [name], MD   Brief Narrative: Willie Andrade is a 66 y.o. male with a history of ESRD (TTS), chronic systolic CHF s/p AICD, hypotension on midodrine, PVD s/p left femoral-peroneal bypass 6/27 by Dr. Arbie Cookey, and anemia who presented to the ED by EMS with AMS after passing out. He reports taking extra doses of percocet for recently worsening bilateral feet pain and initial evaluation noted pinpoint pupils and some improvement in mentation with narcan. In the ED he was afebrile with mild leukocytosis and hypotension with suspected sepsis from left foot gangrene and started on vancomycin and zosyn. IV fluids were administered with some improvement in blood pressure.    Recently (6/19 - 6/29) was admitted for left foot cellulitis and dry gangrene treated with vancomycin and zosyn and left femoral-peroneal bypass with anticipated eventual left toe/foot debridement or amputation. He was discharge 6/29 and readmitted that same day for weakness in parking lot after discharge, orthostatic hypotension, discharged again 7/1. On 7/12 office visit with Dr. Edilia Bo, lymphocele in left groin aspirated.   Assessment & Plan: Principal Problem:   Hypotension Active Problems:   ESRD (end stage renal disease) (HCC)   Chronic systolic heart failure (HCC)   Gangrene of toe of left foot (HCC)   Narcotic overdose   Sepsis (HCC)   Severe protein-calorie malnutrition (HCC)  Acute encephalopathy: Resolved. Likely due primarily to opioids, as this improved without significant improvement in blood pressure.  Acute on chronic hypotension: Possibly due to systemic infection and/or opioid overdose.  - Continue midodrine 10 mg TID (dose recently increased) - Would give small boluses to maintain MAP >65 - Continue observation in SDU during sepsis evaluation/management.   PVD s/p left femoral-peroneal bypass 6/27 by Dr. Arbie Cookey and dry  gangrene of the left foot with possible sepsis: - Appreciate Dr. Bosie Helper assessment. No further revascularization options. Doubt gangrene is cause of hypotension.  - Follow up with podiatry for dry gangrene as scheduled, no indication for urgent amputation. - Remains hypotensive. Will monitor blood cultures x48 hours, continue broad spectrum abx. If negative, anticipate DC off abx.   ESRD: HD at [facility], EDW raised 68kg > 72kg.  - Appreciate nephrology assistance. Planning routine HD 7/17.   Chronic systolic heart failure: EF 15-20% in May 2018. Has AICD placed. No evidence of decompensation.  - HD for volume management as BP will tolerate.   Severe protein calorie malnutrition:  - Nutrition evaluated the patient: Nepro shake po BID - Continue renal vitamins as ordered  Lymphocele: Of left leg s/p revascularization.  - Continue to monitor. May require repeat aspiration. Follow up with [facility].  DVT prophylaxis: Lovenox Code Status: Full Family Communication: None at bedside Disposition Plan: Continue SDU management of sepsis. If sepsis ruled out with negative cultures 7/17, may DC home after HD.  Consultants:   Nephrology  Vascular surgery, Dr. Arbie Cookey  Procedures:   None  Antimicrobials:  Vancomycin, zosyn 7/15 >>    Subjective: Left > right feet ache, improved with percocet and nothing else. Limiting ambulation, though he walks infrequently. No chest pain or dyspnea.   Objective: Vitals:   02/12/17 0800 02/12/17 1100 02/12/17 1200 02/12/17 1500  BP: (!) 90/58 (!) 86/58 (!) 78/53 (!) 81/54  Pulse:      Resp: (!) 33 (!) 26 16   Temp:  98.5 F (36.9 C)    TempSrc:  Oral    SpO2:  Weight:      Height:        Intake/Output Summary (Last 24 hours) at 02/12/17 1524 Last data filed at 02/12/17 1400  Gross per 24 hour  Intake              675 ml  Output                0 ml  Net              675 ml   Filed Weights   02/11/17 1606 02/11/17 1906  Weight:  72.6 kg (160 lb) 72.4 kg (159 lb 9.8 oz)    Examination: General exam: Chronically ill-appearing male in no distress Respiratory system: Non-labored breathing room air. Clear to auscultation bilaterally.  Cardiovascular system: Regular rate and rhythm. No murmur, rub, or gallop. No JVD Gastrointestinal system: Abdomen soft, non-tender, non-distended, with normoactive bowel sounds. No organomegaly or masses felt. Central nervous system: Alert and oriented to person, place, time, situation. No focal neurological deficits. Extremities: LLE with well healing surgical incision from thigh to medial calf, nontender lymphocele at inguinal fold. + pulse in graft. No pulse palpable in left DP or PT. Barely palpable pulse in right DP/PT.  Skin: As above and no erythema or tenderness overlying ICD in right lateral chest. Psychiatry: Judgement and insight appear normal. Mood & affect appropriate.   Data Reviewed: I have personally reviewed following labs and imaging studies  CBC:  Recent Labs Lab 02/11/17 1651 02/12/17 0545  WBC 11.1* 8.6  NEUTROABS 9.6*  --   HGB 11.4* 12.9*  HCT 36.9* 42.4  MCV 90.4 91.2  PLT 188 165   Basic Metabolic Panel:  Recent Labs Lab 02/11/17 1651 02/12/17 0208  NA 139 140  K 3.2* 5.1  CL 98* 100*  CO2 26 26  GLUCOSE 102* 78  BUN 24* 24*  CREATININE 6.62* 6.29*  CALCIUM 6.7* 6.5*   GFR: Estimated Creatinine Clearance: 11.8 mL/min (A) (by C-G formula based on SCr of 6.29 mg/dL (H)). Liver Function Tests:  Recent Labs Lab 02/11/17 1651  AST 20  ALT 7*  ALKPHOS 113  BILITOT 0.6  PROT 6.3*  ALBUMIN 2.6*   No results for input(s): LIPASE, AMYLASE in the last 168 hours. No results for input(s): AMMONIA in the last 168 hours. Coagulation Profile:  Recent Labs Lab 02/11/17 2217  INR 1.22   Cardiac Enzymes: No results for input(s): CKTOTAL, CKMB, CKMBINDEX, TROPONINI in the last 168 hours. BNP (last 3 results) No results for input(s): PROBNP  in the last 8760 hours. HbA1C: No results for input(s): HGBA1C in the last 72 hours. CBG:  Recent Labs Lab 02/12/17 0748 02/12/17 0824  GLUCAP 48* 70   Lipid Profile: No results for input(s): CHOL, HDL, LDLCALC, TRIG, CHOLHDL, LDLDIRECT in the last 72 hours. Thyroid Function Tests: No results for input(s): TSH, T4TOTAL, FREET4, T3FREE, THYROIDAB in the last 72 hours. Anemia Panel: No results for input(s): VITAMINB12, FOLATE, FERRITIN, TIBC, IRON, RETICCTPCT in the last 72 hours. Urine analysis: No results found for: COLORURINE, APPEARANCEUR, LABSPEC, PHURINE, GLUCOSEU, HGBUR, BILIRUBINUR, KETONESUR, PROTEINUR, UROBILINOGEN, NITRITE, LEUKOCYTESUR Recent Results (from the past 240 hour(s))  Blood culture (routine x 2)     Status: None (Preliminary result)   Collection Time: 02/11/17  4:48 PM  Result Value Ref Range Status   Specimen Description BLOOD RIGHT FOREARM  Final   Special Requests IN PEDIATRIC BOTTLE Blood Culture adequate volume  Final   Culture  Setup Time  Final    GRAM POSITIVE COCCI IN CLUSTERS IN PEDIATRIC BOTTLE CRITICAL RESULT CALLED TO, READ BACK BY AND VERIFIED WITH: E MARTIN,PHARMD AT 1352 02/12/17 BY L BENFIELD    Culture GRAM POSITIVE COCCI  Final   Report Status PENDING  Incomplete  Blood Culture ID Panel (Reflexed)     Status: Abnormal   Collection Time: 02/11/17  4:48 PM  Result Value Ref Range Status   Enterococcus species NOT DETECTED NOT DETECTED Final   Listeria monocytogenes NOT DETECTED NOT DETECTED Final   Staphylococcus species DETECTED (A) NOT DETECTED Final    Comment: Methicillin (oxacillin) resistant coagulase negative staphylococcus. Possible blood culture contaminant (unless isolated from more than one blood culture draw or clinical case suggests pathogenicity). No antibiotic treatment is indicated for blood  culture contaminants. CRITICAL RESULT CALLED TO, READ BACK BY AND VERIFIED WITH: E MARTIN,PHARMD AT 1352 02/12/17 BY L BENFIELD     Staphylococcus aureus NOT DETECTED NOT DETECTED Final   Methicillin resistance DETECTED (A) NOT DETECTED Final    Comment: CRITICAL RESULT CALLED TO, READ BACK BY AND VERIFIED WITH: E MARTIN,PHARMD AT 1352 02/12/17 BY L BENFIELD    Streptococcus species NOT DETECTED NOT DETECTED Final   Streptococcus agalactiae NOT DETECTED NOT DETECTED Final   Streptococcus pneumoniae NOT DETECTED NOT DETECTED Final   Streptococcus pyogenes NOT DETECTED NOT DETECTED Final   Acinetobacter baumannii NOT DETECTED NOT DETECTED Final   Enterobacteriaceae species NOT DETECTED NOT DETECTED Final   Enterobacter cloacae complex NOT DETECTED NOT DETECTED Final   Escherichia coli NOT DETECTED NOT DETECTED Final   Klebsiella oxytoca NOT DETECTED NOT DETECTED Final   Klebsiella pneumoniae NOT DETECTED NOT DETECTED Final   Proteus species NOT DETECTED NOT DETECTED Final   Serratia marcescens NOT DETECTED NOT DETECTED Final   Haemophilus influenzae NOT DETECTED NOT DETECTED Final   Neisseria meningitidis NOT DETECTED NOT DETECTED Final   Pseudomonas aeruginosa NOT DETECTED NOT DETECTED Final   Candida albicans NOT DETECTED NOT DETECTED Final   Candida glabrata NOT DETECTED NOT DETECTED Final   Candida krusei NOT DETECTED NOT DETECTED Final   Candida parapsilosis NOT DETECTED NOT DETECTED Final   Candida tropicalis NOT DETECTED NOT DETECTED Final  Blood culture (routine x 2)     Status: None (Preliminary result)   Collection Time: 02/11/17  4:51 PM  Result Value Ref Range Status   Specimen Description BLOOD RIGHT HAND  Final   Special Requests   Final    BOTTLES DRAWN AEROBIC AND ANAEROBIC Blood Culture adequate volume   Culture NO GROWTH < 24 HOURS  Final   Report Status PENDING  Incomplete      Radiology Studies: Ct Head Wo Contrast  Result Date: 02/11/2017 CLINICAL DATA:  Overdose, altered mental status, revised with Narcan, vomiting EXAM: CT HEAD WITHOUT CONTRAST TECHNIQUE: Contiguous axial images were  obtained from the base of the skull through the vertex without intravenous contrast. Sagittal and coronal MPR images reconstructed from axial data set. COMPARISON:  08/12/2016 FINDINGS: Brain: Generalized atrophy. Normal ventricular morphology. No midline shift or mass effect. Normal appearance of brain parenchyma. No intracranial hemorrhage, mass lesion, evidence of acute infarction, or extra-axial fluid collection. Vascular: Atherosclerotic calcification of internal carotid and vertebral arteries at skullbase Skull: Intact Sinuses/Orbits: Clear Other: N/A IMPRESSION: No acute intracranial abnormalities. Electronically Signed   By: Ulyses Southward M.D.   On: 02/11/2017 16:47   Dg Chest Port 1 View  Result Date: 02/11/2017 CLINICAL DATA:  Recent drug overdose EXAM:  PORTABLE CHEST 1 VIEW COMPARISON:  12/22/2016 FINDINGS: Cardiac shadow remains enlarged. A defibrillator is again seen and stable. Aortic calcifications are again noted. No focal infiltrate or sizable effusion is seen. No acute bony abnormality is noted. IMPRESSION: No acute abnormality noted. Electronically Signed   By: Alcide Clever M.D.   On: 02/11/2017 17:14    Scheduled Meds: . [START ON 02/13/2017] calcitRIOL  1 mcg Oral Q T,Th,Sa-HD  . calcium acetate  2,001 mg Oral TID WC  . [START ON 02/13/2017] cinacalcet  180 mg Oral Q T,Th,Sat-1800  . docusate sodium  100 mg Oral BID  . enoxaparin (LOVENOX) injection  30 mg Subcutaneous Q24H  . feeding supplement (NEPRO CARB STEADY)  237 mL Oral BID BM  . gabapentin  100 mg Oral BID  . midodrine  10 mg Oral TID WC  . [START ON 02/13/2017] multivitamin  1 tablet Oral QHS  . sodium chloride flush  3 mL Intravenous Q12H   Continuous Infusions: . piperacillin-tazobactam (ZOSYN)  IV Stopped (02/12/17 1234)  . sodium chloride    . sodium chloride 250 mL (02/12/17 0519)  . [START ON 02/13/2017] vancomycin       LOS: 0 days   Time spent: 25 minutes.  Hazeline Junker, MD [facility] Pager  [phone]  If 7PM-7AM, please contact night-coverage [email or web address] Password [email or web address] 02/12/2017, 3:24 PM

## 2017-02-12 NOTE — Progress Notes (Signed)
Hypoglycemic Event  CBG: 48  Treatment: 15 GM carbohydrate snack  Symptoms: Hungry  Follow-up CBG: [id number] CBG Result:70  Possible Reasons for Event: Inadequate meal intake  Comments/MD notified:Dr. Jarvis Newcomer notified.     Sherene Sires

## 2017-02-13 ENCOUNTER — Ambulatory Visit: Payer: Medicare Other | Admitting: Podiatry

## 2017-02-13 DIAGNOSIS — I959 Hypotension, unspecified: Secondary | ICD-10-CM

## 2017-02-13 LAB — CBC
HEMATOCRIT: 38 % — AB (ref 39.0–52.0)
HEMOGLOBIN: 11.4 g/dL — AB (ref 13.0–17.0)
MCH: 27.2 pg (ref 26.0–34.0)
MCHC: 30 g/dL (ref 30.0–36.0)
MCV: 90.7 fL (ref 78.0–100.0)
Platelets: 187 10*3/uL (ref 150–400)
RBC: 4.19 MIL/uL — AB (ref 4.22–5.81)
RDW: 19.3 % — ABNORMAL HIGH (ref 11.5–15.5)
WBC: 7.8 10*3/uL (ref 4.0–10.5)

## 2017-02-13 LAB — RENAL FUNCTION PANEL
ALBUMIN: 2.5 g/dL — AB (ref 3.5–5.0)
ANION GAP: 15 (ref 5–15)
BUN: 37 mg/dL — ABNORMAL HIGH (ref 6–20)
CO2: 27 mmol/L (ref 22–32)
Calcium: 7.3 mg/dL — ABNORMAL LOW (ref 8.9–10.3)
Chloride: 99 mmol/L — ABNORMAL LOW (ref 101–111)
Creatinine, Ser: 8.17 mg/dL — ABNORMAL HIGH (ref 0.61–1.24)
GFR calc Af Amer: 7 mL/min — ABNORMAL LOW (ref >60)
GFR, EST NON AFRICAN AMERICAN: 6 mL/min — AB (ref >60)
Glucose, Bld: 77 mg/dL (ref 65–99)
PHOSPHORUS: 6.5 mg/dL — AB (ref 2.5–4.6)
POTASSIUM: 4 mmol/L (ref 3.5–5.1)
Sodium: 141 mmol/L (ref 135–145)

## 2017-02-13 MED ORDER — ALTEPLASE 2 MG IJ SOLR: 2.0000 mg | Freq: Once | INTRAMUSCULAR | Status: DC | PRN

## 2017-02-13 MED ORDER — OXYCODONE-ACETAMINOPHEN 5-325 MG PO TABS
ORAL_TABLET | ORAL | Status: AC
  Filled 2017-02-13: qty 1

## 2017-02-13 MED ORDER — SODIUM CHLORIDE 0.9 % IV SOLN: 100.0000 mL | INTRAVENOUS | Status: DC | PRN

## 2017-02-13 MED ORDER — HEPARIN SODIUM (PORCINE) 1000 UNIT/ML DIALYSIS: 1000.0000 [IU] | INTRAMUSCULAR | Status: DC | PRN

## 2017-02-13 MED ORDER — LIDOCAINE HCL (PF) 1 % IJ SOLN: 5.0000 mL | INTRAMUSCULAR | Status: DC | PRN

## 2017-02-13 MED ORDER — SODIUM CHLORIDE 0.9 % IV SOLN
100.0000 mL | INTRAVENOUS | Status: DC | PRN
Start: 2017-02-13 — End: 2017-02-13

## 2017-02-13 MED ORDER — CALCITRIOL 0.5 MCG PO CAPS
ORAL_CAPSULE | ORAL | Status: AC
  Filled 2017-02-13: qty 2

## 2017-02-13 MED ORDER — PENTAFLUOROPROP-TETRAFLUOROETH EX AERO: 1.0000 "application " | INHALATION_SPRAY | CUTANEOUS | Status: DC | PRN

## 2017-02-13 MED ORDER — HEPARIN SODIUM (PORCINE) 1000 UNIT/ML DIALYSIS: 20.0000 [IU]/kg | INTRAMUSCULAR | Status: DC | PRN

## 2017-02-13 MED ORDER — LIDOCAINE-PRILOCAINE 2.5-2.5 % EX CREA: 1.0000 "application " | TOPICAL_CREAM | CUTANEOUS | Status: DC | PRN

## 2017-02-13 MED ORDER — VANCOMYCIN HCL IN DEXTROSE 750-5 MG/150ML-% IV SOLN
INTRAVENOUS | Status: AC
  Filled 2017-02-13: qty 150

## 2017-02-13 MED ORDER — HEPARIN SODIUM (PORCINE) 1000 UNIT/ML DIALYSIS
2000.0000 [IU] | Freq: Once | INTRAMUSCULAR | Status: AC
  Administered 2017-02-13: 2000 [IU] via INTRAVENOUS_CENTRAL

## 2017-02-13 NOTE — Progress Notes (Signed)
Pharmacy Antibiotic Note  Willie Andrade is a 66 y.o. male admitted on 02/11/2017 with c/o of overdose and later paged out as code sepsis.  Pharmacy has been consulted for vancomycin and Zosyn dosing. Pt is ESRD-TTSat, remains on schedule.  Plan: Vancomycin 750mg  IV qHD-TTSat Zosyn 3.375g IV q8h (4 hour infusion). F/U clinical progression and LOT along with HD schedule/tolerance  Height: 6\' 2"  (188 cm) Weight: 164 lb 0.4 oz (74.4 kg) IBW/kg (Calculated) : 82.2  Temp (24hrs), Avg:98.5 F (36.9 C), Min:98 F (36.7 C), Max:98.9 F (37.2 C)   Recent Labs Lab 02/11/17 1651 02/11/17 1709 02/11/17 2217 02/12/17 0208 02/12/17 0545 02/13/17 0205  WBC 11.1*  --   --   --  8.6 7.8  CREATININE 6.62*  --   --  6.29*  --  8.17*  LATICACIDVEN  --  1.73 1.9 2.3*  --   --     Estimated Creatinine Clearance: 9.4 mL/min (A) (by C-G formula based on SCr of 8.17 mg/dL (H)).    No Known Allergies  Antimicrobials this admission: 7/15 Vancomycin>> 7/15 Zosyn>>  Dose adjustments this admission: n/a  Microbiology results: 7/15 blood x2: 1/2 gram + cocci in clusters 7/15 BCID: MRSE  Thank you for allowing pharmacy to be a part of this patient's care.  June Vacha D. Shiana Rappleye, PharmD, BCPS Clinical Pharmacist Pager: [phone] Clinical Phone for 02/13/2017 until 3:30pm: [phone] If after 3:30pm, please call main pharmacy at [phone] 02/13/2017 10:08 AM

## 2017-02-13 NOTE — Progress Notes (Signed)
Discharge note. Patient educated at bedside by RN. RN educated on medications and when to take them, follow-up appointments, diet recommendations, activity instructions, and when to seek medical attention. Initially patient refused education prior to discharge; however, after speaking with the patient, he agreed to go over information with RN. PIV removed w/o complications. Upon getting ready for discharge, patient became aware of the fact that his shirts had been cut off when he arrived at the hospital. Patient was upset about his shirts and was stating that he was going to go down to the ED and beat up staff over his shirts.   Patient discharged in wheelchair with SWAT RN.

## 2017-02-13 NOTE — Care Management Note (Addendum)
Case Management Note  Patient Details  Name: Willie Andrade MRN: [id number] Date of Birth: Nov 29, 1950  Subjective/Objective:    Pt admitted with AMS and ischemic left foot - now s/p revascularization with continued pain          Action/Plan:  PTA from home independent on outpt HD.  Pt recently discharged home with [facility] PT/OT for home health - agency is aware of admit.  CM will request resumption orders.  CM will continue to follow for discharge needs   Expected Discharge Date:  02/13/17               Expected Discharge Plan:  Home w Home Health Services  In-House Referral:     Discharge planning Services  CM Consult  Post Acute Care Choice:    Choice offered to:  Patient  DME Arranged:    DME Agency:     HH Arranged:  PT, OT HH Agency:   [facility]  Status of Service:  Completed, signed off  If discussed at Long Length of Stay Meetings, dates discussed:    Additional Comments: Pt would like to initiate [facility] - previous admission he declined - [facility] informed of discharge home today and has accepted new referral.  Pt states he will pay for taxi to transport home, pt denied needing any equipment-states he is independent.  Pt stays with sister and uses [facility] transportation to and from HD.  Pt confirmed he has PCP and denied barriers to obtaining medications as prescribed Cherylann Parr, RN 02/13/2017, 2:01 PM

## 2017-02-13 NOTE — Progress Notes (Signed)
Patient transported to hemodialysis.  

## 2017-02-13 NOTE — Progress Notes (Signed)
  [facility] Progress Note   Assessment/ Plan:    [facility] 3.75h 180F BFR 400/800 2K/2Ca EDW 72 kg  -Heparin Bolus 1400 -Calcitriol PO q HD -Mircera 75 mcg q 2 weeks  (7/3) -Venofer 50 mg IV q week (7/12) -Sensipar 180 mg PO q HD   1. Acute on chronic hypotension/?sepsis -  SBPs 70-80s on admission with elevated lactic acid. Negative CXR/Head CT.  Blood culture +GPC; appears to be methicillin resistant coag neg staph. On empiric Zosyn/Vanc - per primary.  Likely exacerbated by taking pain meds too. 2. L foot gangrene s/p fem-peroneal bypass 01/24/17 - seen by VVS no further intervention planned this admission ?source #1  3.  ESRD -  TTS; normal schedule.   4.  Hypotension/volume  - Chronic hypotension on midodrine TID/Volume stable - even UF  5.  Anemia  - Hgb 12.9 Hold ESA  and follow  6.  Metabolic bone disease -   Ca 6.5  - Cont Calcitriol/Ca acetate binder/Sensipar  7.  Nutrition - Renal diet/vitamins  8. CHF AICD EF 15-20%  Subjective:    Feeling better.  Reports that his feet are still hurting.     Objective:   BP 111/63   Pulse 74   Temp 98 F (36.7 C) (Oral)   Resp 16   Ht 6\' 2"  (1.88 m)   Wt 74.4 kg (164 lb 0.4 oz)   SpO2 96%   BMI 21.06 kg/m   Physical Exam: General: Frail man lying in bed, NAD Neck: Supple. No JVD No masses Lungs: CTAB Breathing is unlabored. Heart: RRR with S1 S2  Abdomen: soft NT + BS Lower extremities: bilat feet with dry gangrenous changes, no open wounds, mild edema, feet cool  Neuro: nonfocal Dialysis Access: LUE AVF +bruit   Labs: BMET  Recent Labs Lab 02/11/17 1651 02/12/17 0208 02/13/17 0205  NA 139 140 141  K 3.2* 5.1 4.0  CL 98* 100* 99*  CO2 26 26 27   GLUCOSE 102* 78 77  BUN 24* 24* 37*  CREATININE 6.62* 6.29* 8.17*  CALCIUM 6.7* 6.5* 7.3*  PHOS  --   --  6.5*   CBC  Recent Labs Lab 02/11/17 1651 02/12/17 0545 02/13/17 0205  WBC 11.1* 8.6 7.8  NEUTROABS 9.6*  --   --   HGB 11.4*  12.9* 11.4*  HCT 36.9* 42.4 38.0*  MCV 90.4 91.2 90.7  PLT 188 165 187    @IMGRELPRIORS @ Medications:    . calcitRIOL  1 mcg Oral Q T,Th,Sa-HD  . calcium acetate  2,001 mg Oral TID WC  . cinacalcet  180 mg Oral Q T,Th,Sat-1800  . docusate sodium  100 mg Oral BID  . enoxaparin (LOVENOX) injection  30 mg Subcutaneous Q24H  . feeding supplement (NEPRO CARB STEADY)  237 mL Oral BID BM  . gabapentin  100 mg Oral BID  . midodrine  10 mg Oral TID WC  . multivitamin  1 tablet Oral QHS  . sodium chloride flush  3 mL Intravenous Q12H     Bufford Buttner, MD [facility] pgr [phone] 02/13/2017, 8:15 AM

## 2017-02-13 NOTE — Discharge Summary (Signed)
Physician Discharge Summary  Willie Andrade ZOX:[id number] DOB: 1951/03/25 DOA: 02/11/2017  PCP: [name], MD  Admit date: 02/11/2017 Discharge date: 02/13/2017  Admitted From: Home Disposition: Home   Recommendations for Outpatient Follow-up:  1. Follow up with podiatry 7/18.  2. Follow up with vascular surgery as previously scheduled.   Home Health: Resumed at discharge Equipment/Devices: None new Discharge Condition: Stable CODE STATUS: Full Diet recommendation: Renal  Brief/Interim Summary: Willie Andrade is a 66 y.o. male with a history of ESRD (TTS), chronic systolic CHF s/p AICD, hypotension on midodrine, PVD s/p left femoral-peroneal bypass 6/27 by Dr. Arbie Cookey, and anemia who presented to the ED by EMS with AMS after passing out. He reports taking extra doses of percocet for recently worsening bilateral feet pain and initial evaluation noted pinpoint pupils and some improvement in mentation with narcan. In the ED he was afebrile with mild leukocytosis and hypotension with possible sepsis from left foot gangrene and started on vancomycin and zosyn. IV fluids were administered with some improvement in blood pressure. Blood cultures were drawn and empiric abx continued, though the gangrene did not appear grossly infected. Vascular surgery reevaluated the patient while inpatient and recommends continued follow up as outpatient. He remained afebrile, leukocytosis resolved, and blood cultures grew CoNS in 1 of 2 consistent with clinically insignificant contaminant. Hemodialysis was performed 7/17 without difficulty or recurrent hypotension. He will follow up with podiatry the day after discharge.   Recently (6/19 - 6/29) was admitted for left foot cellulitis and dry gangrene treated with vancomycin and zosyn and left femoral-peroneal bypass with anticipated eventual left toe/foot debridement or amputation. He was discharge 6/29 and readmitted that same day for weakness in parking lot after  discharge, orthostatic hypotension, discharged again 7/1. On 7/12 office visit with Dr. Edilia Bo, lymphocele in left groin aspirated.   Discharge Diagnoses:  Principal Problem:   Hypotension Active Problems:   ESRD (end stage renal disease) (HCC)   Chronic systolic heart failure (HCC)   Gangrene of toe of left foot (HCC)   Narcotic overdose   Sepsis (HCC)   Severe protein-calorie malnutrition (HCC)  Acute encephalopathy: Resolved. Likely due primarily to opioids, as this improved without significant improvement in blood pressure and no further evidence of sepsis.  Acute on chronic hypotension: Most likely due to opioid overdose.  - Continue midodrine 10 mg TID (dose recently increased)  PVD s/p left femoral-peroneal bypass 6/27 by Dr. Arbie Cookey and dry gangrene of the left foot: Sepsis ruled out.  - Appreciate Dr. Bosie Helper assessment. No further revascularization options. Doubt gangrene is cause of hypotension.  - Follow up with podiatry for dry gangrene as scheduled, no indication for urgent amputation. - Only single bottle contaminant on blood cultures at 48 hours, so will DC abx and discharge with follow up in 24 hours.   ESRD: HD at [facility] T[facility], EDW raised 68kg > 72kg. 74kg pre HD weight 7/17.  - Appreciate nephrology assistance.  Chronic systolic heart failure: EF 15-20% in May 2018. Has AICD placed. No evidence of decompensation.  - HD for volume management. BP tolerated on 7/17.   Severe protein calorie malnutrition:  - Nutrition evaluated the patient: Nepro shake po BID - Continue renal vitamins  Lymphocele: Of left leg s/p revascularization.  - Continue to monitor. May require repeat aspiration, but not currently indicated. Follow up with VVS.  Discharge Instructions Discharge Instructions    Discharge instructions    Complete by:  As directed    You were admitted  due to low blood pressure which has improved. You were confused at the time and improved with narcan  so it is possible that you overdosed on the percocet. You should take this only as much and only as frequently as directed.  - Follow up with podiatry as scheduled tomorrow - If you experience fever, chills, or discharge from the foot/surgical incisions, seek medical attention right away.     Allergies as of 02/13/2017   No Known Allergies     Medication List    TAKE these medications   calcium acetate 667 MG capsule Commonly known as:  PHOSLO Take 3 capsules (2,001 mg total) by mouth 3 (three) times daily with meals.   cinacalcet 30 MG tablet Commonly known as:  SENSIPAR Take 6 tablets (180 mg total) by mouth every Tuesday, Thursday, and Saturday at 6 PM.   DIALYVITE 800/ZINC PO Take 1 tablet by mouth daily.   diphenhydrAMINE 25 MG tablet Commonly known as:  BENADRYL Take 25 mg by mouth every 6 (six) hours as needed for itching.   feeding supplement (NEPRO CARB STEADY) Liqd Take 237 mLs by mouth 2 (two) times daily between meals.   gabapentin 100 MG capsule Commonly known as:  NEURONTIN Take 100 mg by mouth 2 (two) times daily.   lidocaine-prilocaine cream Commonly known as:  EMLA Apply 1 application topically See admin instructions. Apply topically to dialysis access every Tuesday, Thursday, Saturday prior to hemodialysis   midodrine 5 MG tablet Commonly known as:  PROAMATINE Take 2 tablets (10 mg total) by mouth 3 (three) times daily with meals.   oxyCODONE-acetaminophen 5-325 MG tablet Commonly known as:  PERCOCET/ROXICET Take 1-2 tablets by mouth every 6 (six) hours as needed for severe pain.   polyethylene glycol packet Commonly known as:  MIRALAX / GLYCOLAX Take 17 g by mouth daily as needed for mild constipation.   Salicylic Acid 2 % Crea Apply 1 application topically daily as needed (itching).   sevelamer carbonate 800 MG tablet Commonly known as:  RENVELA Take 4 tablets (3,200 mg total) by mouth 3 (three) times daily with meals.      Follow-up  Information    [name], MD Follow up.   Specialty:  Family Medicine Contact information: [address] [place] [address] [phone]        [name], MD Follow up.   Specialties:  Vascular Surgery, Cardiology Contact information: [address] [place] [address] [phone]          No Known Allergies  Consultations:  Nephrology, Dr. Signe Colt  Vascular surgery, Dr. Arbie Cookey  Procedures/Studies: Ct Head Wo Contrast  Result Date: 02/11/2017 CLINICAL DATA:  Overdose, altered mental status, revised with Narcan, vomiting EXAM: CT HEAD WITHOUT CONTRAST TECHNIQUE: Contiguous axial images were obtained from the base of the skull through the vertex without intravenous contrast. Sagittal and coronal MPR images reconstructed from axial data set. COMPARISON:  08/12/2016 FINDINGS: Brain: Generalized atrophy. Normal ventricular morphology. No midline shift or mass effect. Normal appearance of brain parenchyma. No intracranial hemorrhage, mass lesion, evidence of acute infarction, or extra-axial fluid collection. Vascular: Atherosclerotic calcification of internal carotid and vertebral arteries at skullbase Skull: Intact Sinuses/Orbits: Clear Other: N/A IMPRESSION: No acute intracranial abnormalities. Electronically Signed   By: Ulyses Southward M.D.   On: 02/11/2017 16:47   Dg Chest Port 1 View  Result Date: 02/11/2017 CLINICAL DATA:  Recent drug overdose EXAM: PORTABLE CHEST 1 VIEW COMPARISON:  12/22/2016 FINDINGS: Cardiac shadow  remains enlarged. A defibrillator is again seen and stable. Aortic calcifications are again noted. No focal infiltrate or sizable effusion is seen. No acute bony abnormality is noted. IMPRESSION: No acute abnormality noted. Electronically Signed   By: Alcide Clever M.D.   On: 02/11/2017 17:14   Dg Toe 3rd Left  Result Date: 01/17/2017 CLINICAL DATA:  Acute onset of left foot swelling and pain. Open sore at the left third toe. Initial encounter. EXAM:  LEFT THIRD TOE COMPARISON:  None. FINDINGS: The pattern of air at the distal left fourth toe raises question for infection with a gas producing organism, with overlying soft tissue disruption. No definite osseous erosions are characterized to suggest osteomyelitis, though it cannot be excluded on the basis of radiograph. Soft tissue swelling is noted about the toes. There is no evidence of fracture or dislocation. No radiopaque foreign bodies are seen. IMPRESSION: 1. Pattern of air at the distal left fourth toe raises question for infection with a gas producing organism, with overlying soft tissue disruption. Soft tissue swelling noted about the toes. 2. No definite osseous erosion seen to suggest osteomyelitis, though it cannot be excluded on the basis of radiograph. These results were called by telephone at the time of interpretation on 01/17/2017 at 12:16 am to [facility] PA, who verbally acknowledged these results. Electronically Signed   By: Roanna Raider M.D.   On: 01/17/2017 00:16   Subjective: Pain in bilateral feet stable, improved with percocet. No fevers, drainage. Eating ok, no dizziness or syncope.   Discharge Exam: BP 132/75 (BP Location: Right Arm)   Pulse 85   Temp 98.8 F (37.1 C) (Oral)   Resp 12   Ht 6\' 2"  (1.88 m)   Wt 74.4 kg (164 lb 0.4 oz)   SpO2 95%   BMI 21.06 kg/m   General: Pt is alert, awake, not in acute distress Cardiovascular: RRR, S1/S2 +, no rubs, no gallops Respiratory: CTA bilaterally, no wheezing, no rhonchi Abdominal: Soft, NT, ND, bowel sounds + Extremities: LLE with well healing surgical incision from thigh to medial calf, nontender lymphocele at inguinal fold. + pulse in graft. No pulse palpable in left DP or PT. Barely palpable pulse in right DP/PT.   Labs: Basic Metabolic Panel:  Recent Labs Lab 02/11/17 1651 02/12/17 0208 02/13/17 0205  NA 139 140 141  K 3.2* 5.1 4.0  CL 98* 100* 99*  CO2 26 26 27   GLUCOSE 102* 78 77  BUN 24* 24* 37*   CREATININE 6.62* 6.29* 8.17*  CALCIUM 6.7* 6.5* 7.3*  PHOS  --   --  6.5*   Liver Function Tests:  Recent Labs Lab 02/11/17 1651 02/13/17 0205  AST 20  --   ALT 7*  --   ALKPHOS 113  --   BILITOT 0.6  --   PROT 6.3*  --   ALBUMIN 2.6* 2.5*   CBC:  Recent Labs Lab 02/11/17 1651 02/12/17 0545 02/13/17 0205  WBC 11.1* 8.6 7.8  NEUTROABS 9.6*  --   --   HGB 11.4* 12.9* 11.4*  HCT 36.9* 42.4 38.0*  MCV 90.4 91.2 90.7  PLT 188 165 187   Microbiology Recent Results (from the past 240 hour(s))  Blood culture (routine x 2)     Status: Abnormal (Preliminary result)   Collection Time: 02/11/17  4:48 PM  Result Value Ref Range Status   Specimen Description BLOOD RIGHT FOREARM  Final   Special Requests IN PEDIATRIC BOTTLE Blood Culture adequate volume  Final  Culture  Setup Time   Final    GRAM POSITIVE COCCI IN CLUSTERS IN PEDIATRIC BOTTLE CRITICAL RESULT CALLED TO, READ BACK BY AND VERIFIED WITH: E MARTIN,PHARMD AT 1352 02/12/17 BY L BENFIELD    Culture STAPHYLOCOCCUS SPECIES (COAGULASE NEGATIVE) (A)  Final   Report Status PENDING  Incomplete  Blood Culture ID Panel (Reflexed)     Status: Abnormal   Collection Time: 02/11/17  4:48 PM  Result Value Ref Range Status   Enterococcus species NOT DETECTED NOT DETECTED Final   Listeria monocytogenes NOT DETECTED NOT DETECTED Final   Staphylococcus species DETECTED (A) NOT DETECTED Final    Comment: Methicillin (oxacillin) resistant coagulase negative staphylococcus. Possible blood culture contaminant (unless isolated from more than one blood culture draw or clinical case suggests pathogenicity). No antibiotic treatment is indicated for blood  culture contaminants. CRITICAL RESULT CALLED TO, READ BACK BY AND VERIFIED WITH: E MARTIN,PHARMD AT 1352 02/12/17 BY L BENFIELD    Staphylococcus aureus NOT DETECTED NOT DETECTED Final   Methicillin resistance DETECTED (A) NOT DETECTED Final    Comment: CRITICAL RESULT CALLED TO, READ  BACK BY AND VERIFIED WITH: E MARTIN,PHARMD AT 1352 02/12/17 BY L BENFIELD    Streptococcus species NOT DETECTED NOT DETECTED Final   Streptococcus agalactiae NOT DETECTED NOT DETECTED Final   Streptococcus pneumoniae NOT DETECTED NOT DETECTED Final   Streptococcus pyogenes NOT DETECTED NOT DETECTED Final   Acinetobacter baumannii NOT DETECTED NOT DETECTED Final   Enterobacteriaceae species NOT DETECTED NOT DETECTED Final   Enterobacter cloacae complex NOT DETECTED NOT DETECTED Final   Escherichia coli NOT DETECTED NOT DETECTED Final   Klebsiella oxytoca NOT DETECTED NOT DETECTED Final   Klebsiella pneumoniae NOT DETECTED NOT DETECTED Final   Proteus species NOT DETECTED NOT DETECTED Final   Serratia marcescens NOT DETECTED NOT DETECTED Final   Haemophilus influenzae NOT DETECTED NOT DETECTED Final   Neisseria meningitidis NOT DETECTED NOT DETECTED Final   Pseudomonas aeruginosa NOT DETECTED NOT DETECTED Final   Candida albicans NOT DETECTED NOT DETECTED Final   Candida glabrata NOT DETECTED NOT DETECTED Final   Candida krusei NOT DETECTED NOT DETECTED Final   Candida parapsilosis NOT DETECTED NOT DETECTED Final   Candida tropicalis NOT DETECTED NOT DETECTED Final  Blood culture (routine x 2)     Status: None (Preliminary result)   Collection Time: 02/11/17  4:51 PM  Result Value Ref Range Status   Specimen Description BLOOD RIGHT HAND  Final   Special Requests   Final    BOTTLES DRAWN AEROBIC AND ANAEROBIC Blood Culture adequate volume   Culture NO GROWTH < 24 HOURS  Final   Report Status PENDING  Incomplete    Time coordinating discharge: Approximately 40 minutes  Hazeline Junker, MD  [facility] 02/13/2017, 1:37 PM Pager [phone]

## 2017-02-13 NOTE — Progress Notes (Signed)
Subjective: Interval History: none.. Currently on hemodialysis. Complaining of bilateral foot pain  Objective: Vital signs in last 24 hours: Temp:  [98 F (36.7 C)-98.9 F (37.2 C)] 98 F (36.7 C) (07/17 0645) Pulse Rate:  [38-79] 74 (07/17 0730) Resp:  [8-26] 16 (07/17 0730) BP: (75-111)/(33-74) 111/63 (07/17 0730) SpO2:  [95 %-100 %] 96 % (07/17 0730) Weight:  [164 lb 0.4 oz (74.4 kg)] 164 lb 0.4 oz (74.4 kg) (07/17 0645)  Intake/Output from previous day: 07/16 0701 - 07/17 0700 In: 345 [P.O.:245; IV Piggyback:100] Out: -  Intake/Output this shift: No intake/output data recorded.  Left groin lymphocele seems somewhat smaller today than yesterday. Surgical incisions all healing. Dry gangrenous changes on his toes stable. No tissue loss on his right foot.  Lab Results:  Recent Labs  02/12/17 0545 02/13/17 0205  WBC 8.6 7.8  HGB 12.9* 11.4*  HCT 42.4 38.0*  PLT 165 187   BMET  Recent Labs  02/12/17 0208 02/13/17 0205  NA 140 141  K 5.1 4.0  CL 100* 99*  CO2 26 27  GLUCOSE 78 77  BUN 24* 37*  CREATININE 6.29* 8.17*  CALCIUM 6.5* 7.3*    Studies/Results: Ct Head Wo Contrast  Result Date: 02/11/2017 CLINICAL DATA:  Overdose, altered mental status, revised with Narcan, vomiting EXAM: CT HEAD WITHOUT CONTRAST TECHNIQUE: Contiguous axial images were obtained from the base of the skull through the vertex without intravenous contrast. Sagittal and coronal MPR images reconstructed from axial data set. COMPARISON:  08/12/2016 FINDINGS: Brain: Generalized atrophy. Normal ventricular morphology. No midline shift or mass effect. Normal appearance of brain parenchyma. No intracranial hemorrhage, mass lesion, evidence of acute infarction, or extra-axial fluid collection. Vascular: Atherosclerotic calcification of internal carotid and vertebral arteries at skullbase Skull: Intact Sinuses/Orbits: Clear Other: N/A IMPRESSION: No acute intracranial abnormalities. Electronically  Signed   By: Ulyses Southward M.D.   On: 02/11/2017 16:47   Dg Chest Port 1 View  Result Date: 02/11/2017 CLINICAL DATA:  Recent drug overdose EXAM: PORTABLE CHEST 1 VIEW COMPARISON:  12/22/2016 FINDINGS: Cardiac shadow remains enlarged. A defibrillator is again seen and stable. Aortic calcifications are again noted. No focal infiltrate or sizable effusion is seen. No acute bony abnormality is noted. IMPRESSION: No acute abnormality noted. Electronically Signed   By: Alcide Clever M.D.   On: 02/11/2017 17:14   Dg Toe 3rd Left  Result Date: 01/17/2017 CLINICAL DATA:  Acute onset of left foot swelling and pain. Open sore at the left third toe. Initial encounter. EXAM: LEFT THIRD TOE COMPARISON:  None. FINDINGS: The pattern of air at the distal left fourth toe raises question for infection with a gas producing organism, with overlying soft tissue disruption. No definite osseous erosions are characterized to suggest osteomyelitis, though it cannot be excluded on the basis of radiograph. Soft tissue swelling is noted about the toes. There is no evidence of fracture or dislocation. No radiopaque foreign bodies are seen. IMPRESSION: 1. Pattern of air at the distal left fourth toe raises question for infection with a gas producing organism, with overlying soft tissue disruption. Soft tissue swelling noted about the toes. 2. No definite osseous erosion seen to suggest osteomyelitis, though it cannot be excluded on the basis of radiograph. These results were called by telephone at the time of interpretation on 01/17/2017 at 12:16 am to [facility] PA, who verbally acknowledged these results. Electronically Signed   By: Roanna Raider M.D.   On: 01/17/2017 00:16   Anti-infectives: Anti-infectives  Start     Dose/Rate Route Frequency Ordered Stop   02/13/17 1200  vancomycin (VANCOCIN) IVPB 750 mg/150 ml premix     750 mg 150 mL/hr over 60 Minutes Intravenous Every T-Th-Sa (Hemodialysis) 02/12/17 1152     02/13/17  0806  Vancomycin (VANCOCIN) 750-5 MG/150ML-% IVPB    Comments:  Kiang, Angelito   : cabinet override      02/13/17 0806 02/13/17 2014   02/12/17 0800  piperacillin-tazobactam (ZOSYN) IVPB 3.375 g     3.375 g 12.5 mL/hr over 240 Minutes Intravenous Every 12 hours 02/11/17 1733     02/11/17 1733  vancomycin (VANCOCIN) IVPB 750 mg/150 ml premix  Status:  Discontinued     750 mg 150 mL/hr over 60 Minutes Intravenous Every Dialysis 02/11/17 1733 02/12/17 1152   02/11/17 1730  piperacillin-tazobactam (ZOSYN) IVPB 3.375 g     3.375 g 100 mL/hr over 30 Minutes Intravenous  Once 02/11/17 1720 02/11/17 1804   02/11/17 1730  vancomycin (VANCOCIN) IVPB 1000 mg/200 mL premix     1,000 mg 200 mL/hr over 60 Minutes Intravenous  Once 02/11/17 1720 02/11/17 1930      Assessment/Plan: s/p * No surgery found * Difficult problem. Continued pain despite revascularization. Question ischemia versus neuropathy. No other options regarding his left foot. 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 Pain being managed but this is a chronic issue for him as well.   LOS: 1 day   Gretta Began 02/13/2017, 8:27 AM

## 2017-02-13 NOTE — Procedures (Signed)
Patient seen and examined on Hemodialysis. QB 400 via L AVF, UF goal even.  Tolerating treatment well.  BP improved from yesterday.  Treatment adjusted as needed.  Bufford Buttner MD [facility] pgr [phone] 8:22 AM

## 2017-02-14 ENCOUNTER — Emergency Department (HOSPITAL_COMMUNITY)
Admission: EM | Admit: 2017-02-14 | Discharge: 2017-02-15 | Disposition: A | Discharge status: Home / Self Care | Payer: Medicare Other | Attending: Emergency Medicine | Admitting: Emergency Medicine

## 2017-02-14 ENCOUNTER — Ambulatory Visit (INDEPENDENT_AMBULATORY_CARE_PROVIDER_SITE_OTHER): Payer: Medicare Other | Admitting: Podiatry

## 2017-02-14 ENCOUNTER — Encounter (HOSPITAL_COMMUNITY): Payer: Self-pay | Admitting: *Deleted

## 2017-02-14 ENCOUNTER — Emergency Department (HOSPITAL_COMMUNITY): Payer: Medicare Other

## 2017-02-14 ENCOUNTER — Encounter: Payer: Self-pay | Admitting: Podiatry

## 2017-02-14 VITALS — Temp 96.9°F | Resp 18

## 2017-02-14 DIAGNOSIS — L02619 Cutaneous abscess of unspecified foot: Secondary | ICD-10-CM

## 2017-02-14 DIAGNOSIS — L97521 Non-pressure chronic ulcer of other part of left foot limited to breakdown of skin: Secondary | ICD-10-CM

## 2017-02-14 DIAGNOSIS — I5022 Chronic systolic (congestive) heart failure: Secondary | ICD-10-CM | POA: Insufficient documentation

## 2017-02-14 DIAGNOSIS — M79672 Pain in left foot: Secondary | ICD-10-CM | POA: Diagnosis not present

## 2017-02-14 DIAGNOSIS — I110 Hypertensive heart disease with heart failure: Secondary | ICD-10-CM | POA: Insufficient documentation

## 2017-02-14 DIAGNOSIS — N186 End stage renal disease: Secondary | ICD-10-CM | POA: Insufficient documentation

## 2017-02-14 DIAGNOSIS — E785 Hyperlipidemia, unspecified: Secondary | ICD-10-CM | POA: Insufficient documentation

## 2017-02-14 DIAGNOSIS — F17210 Nicotine dependence, cigarettes, uncomplicated: Secondary | ICD-10-CM | POA: Diagnosis not present

## 2017-02-14 DIAGNOSIS — I120 Hypertensive chronic kidney disease with stage 5 chronic kidney disease or end stage renal disease: Secondary | ICD-10-CM | POA: Diagnosis not present

## 2017-02-14 DIAGNOSIS — M79671 Pain in right foot: Secondary | ICD-10-CM | POA: Diagnosis present

## 2017-02-14 DIAGNOSIS — Z992 Dependence on renal dialysis: Secondary | ICD-10-CM | POA: Diagnosis not present

## 2017-02-14 DIAGNOSIS — L03119 Cellulitis of unspecified part of limb: Secondary | ICD-10-CM

## 2017-02-14 DIAGNOSIS — Z79899 Other long term (current) drug therapy: Secondary | ICD-10-CM | POA: Diagnosis not present

## 2017-02-14 DIAGNOSIS — I96 Gangrene, not elsewhere classified: Secondary | ICD-10-CM

## 2017-02-14 LAB — BASIC METABOLIC PANEL
Anion gap: 17 — ABNORMAL HIGH (ref 5–15)
BUN: 32 mg/dL — ABNORMAL HIGH (ref 6–20)
CO2: 27 mmol/L (ref 22–32)
Calcium: 8.7 mg/dL — ABNORMAL LOW (ref 8.9–10.3)
Chloride: 96 mmol/L — ABNORMAL LOW (ref 101–111)
Creatinine, Ser: 7.47 mg/dL — ABNORMAL HIGH (ref 0.61–1.24)
GFR calc Af Amer: 8 mL/min — ABNORMAL LOW (ref >60)
GFR calc non Af Amer: 7 mL/min — ABNORMAL LOW (ref >60)
Glucose, Bld: 112 mg/dL — ABNORMAL HIGH (ref 65–99)
Potassium: 5.3 mmol/L — ABNORMAL HIGH (ref 3.5–5.1)
Sodium: 140 mmol/L (ref 135–145)

## 2017-02-14 LAB — CBC WITH DIFFERENTIAL/PLATELET
Basophils Absolute: 0 10*3/uL (ref 0.0–0.1)
Basophils Relative: 0 %
Eosinophils Absolute: 0.1 10*3/uL (ref 0.0–0.7)
Eosinophils Relative: 2 %
HCT: 41.5 % (ref 39.0–52.0)
Hemoglobin: 12.9 g/dL — ABNORMAL LOW (ref 13.0–17.0)
Lymphocytes Relative: 10 %
Lymphs Abs: 0.8 10*3/uL (ref 0.7–4.0)
MCH: 27.6 pg (ref 26.0–34.0)
MCHC: 31.1 g/dL (ref 30.0–36.0)
MCV: 88.7 fL (ref 78.0–100.0)
Monocytes Absolute: 0.9 10*3/uL (ref 0.1–1.0)
Monocytes Relative: 11 %
Neutro Abs: 6.2 10*3/uL (ref 1.7–7.7)
Neutrophils Relative %: 77 %
Platelets: 216 10*3/uL (ref 150–400)
RBC: 4.68 MIL/uL (ref 4.22–5.81)
RDW: 19.1 % — ABNORMAL HIGH (ref 11.5–15.5)
WBC: 8.1 10*3/uL (ref 4.0–10.5)

## 2017-02-14 LAB — CULTURE, BLOOD (ROUTINE X 2): SPECIAL REQUESTS: ADEQUATE

## 2017-02-14 LAB — CALCIUM, IONIZED: Calcium, Ionized, Serum: 3.4 mg/dL — ABNORMAL LOW (ref 4.5–5.6)

## 2017-02-14 MED ORDER — OXYCODONE-ACETAMINOPHEN 5-325 MG PO TABS
1.0000 | ORAL_TABLET | Freq: Once | ORAL | Status: AC
  Administered 2017-02-15: 1 via ORAL
  Filled 2017-02-14: qty 1

## 2017-02-14 NOTE — Patient Instructions (Signed)
Return to emergency room immediately today on 02/14/2017 for treatment of gangrene the left foot.

## 2017-02-14 NOTE — ED Triage Notes (Signed)
Pt reports being seen here yesterday for left foot pain. Was referred to foot dr, he went to appt today and was told to come here for re-eval. Noticed pta that his incision site to left leg was bleeding, bandage applied and bleeding controlled.

## 2017-02-14 NOTE — ED Notes (Signed)
Patient transported to X-ray 

## 2017-02-14 NOTE — ED Provider Notes (Signed)
[facility] DEPT Provider Note   CSN: [id number] Arrival date & time: 02/14/17  1731     History   Chief Complaint Chief Complaint  Patient presents with  . Leg Pain    HPI Willie Andrade is a 66 y.o. male.  HPI Patient presents to the emergency department withPain in both feet.  The patient states that he was seen by his podiatrist who stated he needed to come to the emergency department.  The patient has chronic wounds to his lower extremities.  Patient has been admitted recently for cellulitis.  Patient states nothing is new, that he has had chronic pain in his feet but he was sent here by the podiatrist that he saw for the first time todayThe patient denies chest pain, shortness of breath, headache,blurred vision, neck pain, fever, cough, weakness, numbness, dizziness, anorexia, edema, abdominal pain, nausea, vomiting, diarrhea, rash, back pain, dysuria, hematemesis, bloody stool, near syncope, or syncope. Past Medical History:  Diagnosis Date  . Chronic systolic CHF (congestive heart failure), NYHA class 2 (HCC)   . ESRD (end stage renal disease) on dialysis (HCC)   . Hepatitis B   . HTN (hypertension)   . Hyperlipidemia   . 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 Tobacco abuse     Patient Active Problem List   Diagnosis Date Noted  . Narcotic overdose 02/11/2017  . Sepsis (HCC) 02/11/2017  . Severe protein-calorie malnutrition (HCC) 02/11/2017  . General weakness 01/26/2017  . Wound dehiscence 01/26/2017  . Hypotension   . Cellulitis of left foot 01/17/2017  . Cellulitis 01/17/2017  . Gangrene of toe of left foot (HCC)   . Pre-syncope 12/21/2016  . Chronic systolic heart failure (HCC) 10/11/2016  . 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 Pacemaker 10/10/2016  . ESRD (end stage renal disease) (HCC) 02/14/2016    Past Surgical History:    Procedure Laterality Date  . ABDOMINAL AORTOGRAM W/LOWER EXTREMITY N/A 01/22/2017   Procedure: Abdominal Aortogram w/Lower Extremity;  Surgeon: [name], MD;  Location: [facility];  Service: Cardiovascular;  Laterality: N/A;  . BIV ICD GENERATOR CHANGEOUT N/A 11/28/2016   Procedure: BiV ICD [employer]; St Jude Surgeon: Will Jorja Loa, MD;  Location: [facility];  Service: Cardiovascular;  Laterality: N/A;  . BYPASS GRAFT FEMORAL-PERONEAL Left 01/24/2017   Procedure: Left Leg  FEMORAL-PERONEAL Bypass Graft;  Surgeon: [name], MD;  Location: [facility];  Service: Vascular;  Laterality: Left;       Home Medications    Prior to Admission medications   Medication Sig Start Date End Date Taking? Authorizing Provider  B Complex-C-Zn-Folic Acid (DIALYVITE 800/ZINC PO) Take 1 tablet by mouth daily.    [provider]  calcium acetate (PHOSLO) 667 MG capsule Take 3 capsules (2,001 mg total) by mouth 3 (three) times daily with meals. 12/23/16   Clydia Llano, MD  cinacalcet (SENSIPAR) 30 MG tablet Take 6 tablets (180 mg total) by mouth every Tuesday, Thursday, and Saturday at 6 PM. 01/27/17   Edsel Petrin, DO  diphenhydrAMINE (BENADRYL) 25 MG tablet Take 25 mg by mouth every 6 (six) hours as needed for itching.    [provider]  gabapentin (NEURONTIN) 100 MG capsule Take 100 mg by mouth 2 (two) times daily.     [provider]  lidocaine-prilocaine (EMLA) cream Apply 1 application topically See admin instructions.  Apply topically to dialysis access every Tuesday, Thursday, Saturday prior to hemodialysis    [provider]  midodrine (PROAMATINE) 5 MG tablet Take 2 tablets (10 mg total) by mouth 3 (three) times daily with meals. 01/28/17   Osvaldo Shipper, MD  Nutritional Supplements (FEEDING SUPPLEMENT, NEPRO CARB STEADY,) LIQD Take 237 mLs by mouth 2 (two) times daily between meals. 01/27/17   Edsel Petrin, DO   oxyCODONE-acetaminophen (PERCOCET/ROXICET) 5-325 MG tablet Take 1-2 tablets by mouth every 6 (six) hours as needed for severe pain. 02/08/17   Chuck Hint, MD  polyethylene glycol [facility] Ethelene Hal) packet Take 17 g by mouth daily as needed for mild constipation. 01/28/17   Osvaldo Shipper, MD  Salicylic Acid 2 % CREA Apply 1 application topically daily as needed (itching).    [provider]  sevelamer carbonate (RENVELA) 800 MG tablet Take 4 tablets (3,200 mg total) by mouth 3 (three) times daily with meals. Patient not taking: Reported on 02/11/2017 01/26/17   Edsel Petrin, DO    Family History Family History  Problem Relation Age of Onset  . Diabetes Mother   . Gout Mother   . Stroke Father   . Gout Maternal Grandmother   . Gout Sister   . Asthma Brother     Social History Social History  Substance Use Topics  . Smoking status: Current Every Day Smoker    Packs/day: 1.00    Types: Cigarettes  . Smokeless tobacco: Never Used  . Alcohol use No     Allergies   Patient has no known allergies.   Review of Systems Review of Systems All other systems negative except as documented in the HPI. All pertinent positives and negatives as reviewed in the HPI.  Physical Exam Updated Vital Signs BP 106/82 (BP Location: Right Arm)   Pulse 95   Temp 97.7 F (36.5 C) (Oral)   Resp (!) 24   SpO2 100%   Physical Exam  Constitutional: He is oriented to person, place, and time. He appears well-developed and well-nourished. No distress.  HENT:  Head: Normocephalic and atraumatic.  Eyes: Pupils are equal, round, and reactive to light.  Pulmonary/Chest: Effort normal.  Musculoskeletal:       Feet:  Neurological: He is alert and oriented to person, place, and time.  Skin: Skin is warm and dry.  Psychiatric: He has a normal mood and affect.  Nursing note and vitals reviewed.    ED Treatments / Results  Labs (all labs ordered are listed, but only abnormal  results are displayed) Labs Reviewed  BASIC METABOLIC PANEL - Abnormal; Notable for the following:       Result Value   Potassium 5.3 (*)    Chloride 96 (*)    Glucose, Bld 112 (*)    BUN 32 (*)    Creatinine, Ser 7.47 (*)    Calcium 8.7 (*)    GFR calc non Af Amer 7 (*)    GFR calc Af Amer 8 (*)    Anion gap 17 (*)    All other components within normal limits  CBC WITH DIFFERENTIAL/PLATELET - Abnormal; Notable for the following:    Hemoglobin 12.9 (*)    RDW 19.1 (*)    All other components within normal limits    EKG  EKG Interpretation None       Radiology Dg Foot 2 Views Right  Result Date: 02/14/2017 CLINICAL DATA:  Bilateral foot pain.  Open wound on left foot. EXAM: RIGHT FOOT -  2 VIEW COMPARISON:  None. FINDINGS: There is moderate, diffuse soft tissue swelling of the left foot. There is no osteolysis. No soft tissue emphysema. Dorsal vascular calcifications are noted. IMPRESSION: Diffuse soft tissue swelling of the left foot. No evidence of active osteomyelitis. Electronically Signed   By: Deatra Robinson M.D.   On: 02/14/2017 23:13   Dg Foot Complete Left  Result Date: 02/14/2017 CLINICAL DATA:  Bilateral foot pain and swelling with open wounds of the left foot x1 day. EXAM: LEFT FOOT - COMPLETE 3+ VIEW COMPARISON:  Left third and fourth toe radiographs from 01/16/2017 FINDINGS: Soft tissue ulcerations involving the third and fourth toes without underlying fracture nor apparent bone destruction. Joint spaces are intact. The bones are osteopenic. Dorsal calcaneal enthesophyte is noted. Vascular calcifications about the visualized ankle and foot consistent with diabetes. Diffuse generalized soft tissue edema and swelling of the included ankle and dorsum of the foot. IMPRESSION: Generalized soft tissue swelling of the included ankle and foot more so along the dorsum of the forefoot. Soft tissue ulcerations noted of the third and fourth toes without frank bone destruction,  fracture nor dislocations. Electronically Signed   By: Tollie Eth M.D.   On: 02/14/2017 23:14    Procedures Procedures (including critical care time)  Medications Ordered in ED Medications  oxyCODONE-acetaminophen (PERCOCET/ROXICET) 5-325 MG per tablet 1 tablet (not administered)     Initial Impression / Assessment and Plan / ED Course  I have reviewed the triage vital signs and the nursing notes.  Pertinent labs & imaging results that were available during my care of the patient were reviewed by me and considered in my medical decision making (see chart for details).     The patient's wounds are chronic.  He has been treated for cellulitis recently.  The area does not appear to be significantly more inflamed.  Patient had a recent admission for cellulitis of his right.  Patient states that does not appear to be any worse.  It is more pain in his feet bilaterally.  I did advise him that he will need to see his primary doctor for further evaluation.  The patient agrees the plan and all questions were answered  Final Clinical Impressions(s) / ED Diagnoses   Final diagnoses:  None    New Prescriptions New Prescriptions   No medications on file     Kyra Manges 02/15/17 Audley Hose, MD 02/15/17 [phone]

## 2017-02-14 NOTE — Progress Notes (Signed)
   Subjective:    Patient ID: Willie Andrade, male    DOB: 12-26-1950, 66 y.o.   MRN: [id number]  HPI Patient presents complaining of extremely painful feet and suggests these been referred here by Dr. Parke Simmers. Patient has a recent history of vascular surgery with possible postoperative infection and some dehiscence of the wound. Patient is complaining of bilateral extreme foot pain. Patient has history of cellulitis and gangrene and wound dehiscence patient has end-stage renal disease   Review of Systems  Constitutional: Positive for activity change, appetite change, fatigue and unexpected weight change.  Cardiovascular: Positive for leg swelling.       Calf pain with walking  Musculoskeletal: Positive for gait problem.       Muscle pain  Skin:       I have some open wounds and thick scars and thick nails  Neurological: Positive for weakness.       Objective:   Physical Exam  Patient appears orientated 3 and apparent pain  Vascular: Edema left lower extremity greater than right Dehiscence of surgical incision left lower leg DP and PT pulses 0/4 bilaterally Capillary reflex delay bilaterally  Neurological: Sensation to 10 g monofilament wire 1/9 right 8/9 left Vibratory sensation reactive bilaterally Ankle reflexes weakly reactive bilaterally  Dermatological: Vesicular eruption anterior left lower leg Dark discolored digits on left foot Atrophic, shiny skin with absent hair growth bilaterally Elongated, deformed toenails 6-10  Musculoskeletal: Patient extremely tender to light touch and feet bilaterally not able to evaluate joint motion     Assessment & Plan:   Assessment: Gangrene left foot with with associated cellulitis Peripheral arterial disease  Plan: Today I informed patient that I would not be able to treat the gangrene and cellulitis. I instructed patient to present to emergency room today upon discharge from our office

## 2017-02-15 MED ORDER — TRAMADOL HCL 50 MG PO TABS: 50.0000 mg | ORAL_TABLET | Freq: Four times a day (QID) | ORAL | 0 refills | Status: DC | PRN

## 2017-02-15 NOTE — Discharge Instructions (Signed)
Return here as needed.  Follow-up with your doctor for recheck °

## 2017-02-16 LAB — CULTURE, BLOOD (ROUTINE X 2)
CULTURE: NO GROWTH
SPECIAL REQUESTS: ADEQUATE

## 2017-02-19 ENCOUNTER — Encounter: Payer: Self-pay | Admitting: Vascular Surgery

## 2017-02-23 ENCOUNTER — Telehealth: Payer: Self-pay | Admitting: *Deleted

## 2017-02-23 NOTE — Telephone Encounter (Signed)
Returned call to [employer], nurse from [facility], requesting wound care orders for patient.  She is to apply normal saline wet to dry to left groin wound and place xeroform to toes.  Patient has an appointment with Dr Arbie Cookey Tuesday, 02/27/2017 at 8:45 and at that time he will order new wound care.

## 2017-02-27 ENCOUNTER — Encounter: Payer: Self-pay | Admitting: Vascular Surgery

## 2017-02-27 ENCOUNTER — Ambulatory Visit (INDEPENDENT_AMBULATORY_CARE_PROVIDER_SITE_OTHER): Payer: Self-pay | Admitting: Vascular Surgery

## 2017-02-27 ENCOUNTER — Other Ambulatory Visit: Payer: Self-pay

## 2017-02-27 VITALS — BP 93/63 | HR 86 | Temp 97.7°F | Resp 20 | Ht 74.0 in | Wt 160.0 lb

## 2017-02-27 DIAGNOSIS — Z48812 Encounter for surgical aftercare following surgery on the circulatory system: Secondary | ICD-10-CM

## 2017-02-27 MED ORDER — OXYCODONE-ACETAMINOPHEN 5-325 MG PO TABS: 1.0000 | ORAL_TABLET | Freq: Four times a day (QID) | ORAL | 0 refills | Status: DC | PRN

## 2017-02-27 NOTE — Progress Notes (Signed)
Patient name: Willie Andrade MRN: [id number] DOB: November 22, 1950 Sex: male  REASON FOR VISIT: Follow-up film peroneal bypass  HPI: Willie Andrade is a 66 y.o. male here today for follow-up. Has complaints of pain in both lower extremities. Reports this is somewhat left worse than his left and his right. Does have difficulty with chronic pain.  Current Outpatient Prescriptions  Medication Sig Dispense Refill  . B Complex-C-Zn-Folic Acid (DIALYVITE 800/ZINC PO) Take 1 tablet by mouth daily.    . calcium acetate (PHOSLO) 667 MG capsule Take 3 capsules (2,001 mg total) by mouth 3 (three) times daily with meals. 270 capsule 2  . cinacalcet (SENSIPAR) 30 MG tablet Take 6 tablets (180 mg total) by mouth every Tuesday, Thursday, and Saturday at 6 PM. 180 tablet 0  . diphenhydrAMINE (BENADRYL) 25 MG tablet Take 25 mg by mouth every 6 (six) hours as needed for itching.    . gabapentin (NEURONTIN) 100 MG capsule Take 100 mg by mouth 2 (two) times daily.     Marland Kitchen lidocaine-prilocaine (EMLA) cream Apply 1 application topically See admin instructions. Apply topically to dialysis access every Tuesday, Thursday, Saturday prior to hemodialysis    . midodrine (PROAMATINE) 5 MG tablet Take 2 tablets (10 mg total) by mouth 3 (three) times daily with meals. 30 tablet 6  . Nutritional Supplements (FEEDING SUPPLEMENT, NEPRO CARB STEADY,) LIQD Take 237 mLs by mouth 2 (two) times daily between meals.  0  . oxyCODONE-acetaminophen (PERCOCET/ROXICET) 5-325 MG tablet Take 1-2 tablets by mouth every 6 (six) hours as needed for severe pain. 20 tablet 0  . polyethylene glycol (MIRALAX / GLYCOLAX) packet Take 17 g by mouth daily as needed for mild constipation. 14 each 0  . Salicylic Acid 2 % CREA Apply 1 application topically daily as needed (itching).    . traMADol (ULTRAM) 50 MG tablet Take 1 tablet (50 mg total) by mouth every 6 (six) hours as needed for severe pain. 10 tablet 0  . sevelamer  carbonate (RENVELA) 800 MG tablet Take 4 tablets (3,200 mg total) by mouth 3 (three) times daily with meals. (Patient not taking: Reported on 02/11/2017) 360 tablet 0   No current facility-administered medications for this visit.      PHYSICAL EXAM: Vitals:   02/27/17 0848  BP: 93/63  Pulse: 86  Resp: 20  Temp: 97.7 F (36.5 C)  TempSrc: Oral  SpO2: 99%  Weight: 160 lb (72.6 kg)  Height: 6\' 2"  (1.88 m)    GENERAL: The patient is a well-nourished male, in no acute distress. The vital signs are documented above. Does have some swelling in his lower from any. He does have recurrence of the seroma in his left groin. His vein harvest otherwise is fine until his midcalf where he does have a slight area separation. He has an easily palpable femoroperoneal in situ graft pulse. Has gangrene of his dry gangrene of his left fourth toe and the tips of second and third. Also has more superficial gangrene changes of the toes of his right foot as well.  MEDICAL ISSUES: Discussed this at length with the patient. He has a poor understanding of his medical issues. Explained that his surgery was for prevention of amputation on the left. He states that no one will be allowed to take his foot. I explained that his procedure was to hopefully prevent this from being required. Did explain that he will eventually require some type of amputation to his toes. Will continue with local wound care.  I did aspirate 90 cc of straw-colored clear serous fluid from his left groin lymphocele. We'll see him again in several weeks for continued follow-up he is requesting Percocet for pain. Will assure that he is not getting this from other sources and will refill Percocet 12/01/2023 #20 if he is not   [name], MD [facility] Tel [phone] Pager [phone]

## 2017-02-28 ENCOUNTER — Ambulatory Visit: Payer: Medicare Other | Admitting: Podiatry

## 2017-02-28 ENCOUNTER — Telehealth: Payer: Self-pay | Admitting: *Deleted

## 2017-02-28 NOTE — Telephone Encounter (Signed)
Patient called today to report that his left groin lymphocele has filled back up since it was aspirated yesterday by Dr. Arbie Cookey (90ccs removed). He states that he is afebrile and in a lot of pain. Patient received a Rx yesterday, 02-27-17, for Percocet 5-325 #20 yesterday. I pulled up his [facility] records and found that he got Percocet 5-325mg  #60 rx'd by Dr. Parke Simmers on 02-19-17.  I asked the patient to come in tomorrow morning to see our NP while Dr. Arbie Cookey is seeing Vein patients, but he refused and said he would be going to the ED today.

## 2017-03-05 ENCOUNTER — Encounter: Payer: Self-pay | Admitting: Family

## 2017-03-05 ENCOUNTER — Encounter (HOSPITAL_COMMUNITY): Payer: Self-pay

## 2017-03-05 ENCOUNTER — Ambulatory Visit (HOSPITAL_COMMUNITY)
Admission: EM | Admit: 2017-03-05 | Discharge: 2017-03-05 | Disposition: A | Discharge status: Home / Self Care | Payer: Medicare Other

## 2017-03-05 ENCOUNTER — Inpatient Hospital Stay (HOSPITAL_COMMUNITY)
Admission: EM | Admit: 2017-03-05 | Discharge: 2017-03-22 | DRG: 853 | Disposition: A | Discharge status: Home / Self Care | Payer: Medicare Other | Attending: Internal Medicine | Admitting: Internal Medicine

## 2017-03-05 ENCOUNTER — Emergency Department (HOSPITAL_COMMUNITY): Payer: Medicare Other

## 2017-03-05 DIAGNOSIS — E785 Hyperlipidemia, unspecified: Secondary | ICD-10-CM | POA: Diagnosis present

## 2017-03-05 DIAGNOSIS — I132 Hypertensive heart and chronic kidney disease with heart failure and with stage 5 chronic kidney disease, or end stage renal disease: Secondary | ICD-10-CM | POA: Diagnosis present

## 2017-03-05 DIAGNOSIS — Z833 Family history of diabetes mellitus: Secondary | ICD-10-CM

## 2017-03-05 DIAGNOSIS — I9589 Other hypotension: Secondary | ICD-10-CM | POA: Diagnosis present

## 2017-03-05 DIAGNOSIS — I898 Other specified noninfective disorders of lymphatic vessels and lymph nodes: Secondary | ICD-10-CM | POA: Diagnosis present

## 2017-03-05 DIAGNOSIS — L0889 Other specified local infections of the skin and subcutaneous tissue: Secondary | ICD-10-CM | POA: Diagnosis present

## 2017-03-05 DIAGNOSIS — N186 End stage renal disease: Secondary | ICD-10-CM | POA: Diagnosis present

## 2017-03-05 DIAGNOSIS — I998 Other disorder of circulatory system: Secondary | ICD-10-CM | POA: Diagnosis not present

## 2017-03-05 DIAGNOSIS — D631 Anemia in chronic kidney disease: Secondary | ICD-10-CM | POA: Diagnosis present

## 2017-03-05 DIAGNOSIS — I5022 Chronic systolic (congestive) heart failure: Secondary | ICD-10-CM | POA: Diagnosis not present

## 2017-03-05 DIAGNOSIS — A419 Sepsis, unspecified organism: Secondary | ICD-10-CM | POA: Diagnosis not present

## 2017-03-05 DIAGNOSIS — F919 Conduct disorder, unspecified: Secondary | ICD-10-CM | POA: Diagnosis present

## 2017-03-05 DIAGNOSIS — L089 Local infection of the skin and subcutaneous tissue, unspecified: Secondary | ICD-10-CM | POA: Diagnosis not present

## 2017-03-05 DIAGNOSIS — N2581 Secondary hyperparathyroidism of renal origin: Secondary | ICD-10-CM | POA: Diagnosis present

## 2017-03-05 DIAGNOSIS — I872 Venous insufficiency (chronic) (peripheral): Secondary | ICD-10-CM | POA: Diagnosis present

## 2017-03-05 DIAGNOSIS — Z79899 Other long term (current) drug therapy: Secondary | ICD-10-CM

## 2017-03-05 DIAGNOSIS — B965 Pseudomonas (aeruginosa) (mallei) (pseudomallei) as the cause of diseases classified elsewhere: Secondary | ICD-10-CM | POA: Diagnosis present

## 2017-03-05 DIAGNOSIS — I739 Peripheral vascular disease, unspecified: Secondary | ICD-10-CM | POA: Diagnosis present

## 2017-03-05 DIAGNOSIS — L03115 Cellulitis of right lower limb: Secondary | ICD-10-CM | POA: Diagnosis present

## 2017-03-05 DIAGNOSIS — Z950 Presence of cardiac pacemaker: Secondary | ICD-10-CM | POA: Diagnosis not present

## 2017-03-05 DIAGNOSIS — E872 Acidosis: Secondary | ICD-10-CM | POA: Diagnosis present

## 2017-03-05 DIAGNOSIS — E43 Unspecified severe protein-calorie malnutrition: Secondary | ICD-10-CM | POA: Diagnosis present

## 2017-03-05 DIAGNOSIS — I429 Cardiomyopathy, unspecified: Secondary | ICD-10-CM | POA: Diagnosis present

## 2017-03-05 DIAGNOSIS — L03116 Cellulitis of left lower limb: Secondary | ICD-10-CM

## 2017-03-05 DIAGNOSIS — G8929 Other chronic pain: Secondary | ICD-10-CM | POA: Diagnosis present

## 2017-03-05 DIAGNOSIS — B1910 Unspecified viral hepatitis B without hepatic coma: Secondary | ICD-10-CM | POA: Diagnosis present

## 2017-03-05 DIAGNOSIS — Z681 Body mass index (BMI) 19 or less, adult: Secondary | ICD-10-CM

## 2017-03-05 DIAGNOSIS — Z95810 Presence of automatic (implantable) cardiac defibrillator: Secondary | ICD-10-CM

## 2017-03-05 DIAGNOSIS — I96 Gangrene, not elsewhere classified: Secondary | ICD-10-CM | POA: Diagnosis present

## 2017-03-05 DIAGNOSIS — Z9111 Patient's noncompliance with dietary regimen: Secondary | ICD-10-CM

## 2017-03-05 DIAGNOSIS — F17210 Nicotine dependence, cigarettes, uncomplicated: Secondary | ICD-10-CM | POA: Diagnosis present

## 2017-03-05 DIAGNOSIS — E208 Other hypoparathyroidism: Secondary | ICD-10-CM | POA: Diagnosis present

## 2017-03-05 DIAGNOSIS — Z992 Dependence on renal dialysis: Secondary | ICD-10-CM

## 2017-03-05 DIAGNOSIS — E0590 Thyrotoxicosis, unspecified without thyrotoxic crisis or storm: Secondary | ICD-10-CM | POA: Diagnosis present

## 2017-03-05 DIAGNOSIS — T148XXA Other injury of unspecified body region, initial encounter: Secondary | ICD-10-CM | POA: Diagnosis not present

## 2017-03-05 DIAGNOSIS — Z5320 Procedure and treatment not carried out because of patient's decision for unspecified reasons: Secondary | ICD-10-CM | POA: Diagnosis present

## 2017-03-05 HISTORY — DX: Unspecified severe protein-calorie malnutrition: E43

## 2017-03-05 HISTORY — DX: Peripheral vascular disease, unspecified: I73.9

## 2017-03-05 LAB — COMPREHENSIVE METABOLIC PANEL
ALBUMIN: 2.8 g/dL — AB (ref 3.5–5.0)
ALT: 9 U/L — ABNORMAL LOW (ref 17–63)
ANION GAP: 16 — AB (ref 5–15)
AST: 20 U/L (ref 15–41)
Alkaline Phosphatase: 150 U/L — ABNORMAL HIGH (ref 38–126)
BILIRUBIN TOTAL: 0.9 mg/dL (ref 0.3–1.2)
BUN: 35 mg/dL — ABNORMAL HIGH (ref 6–20)
CO2: 26 mmol/L (ref 22–32)
Calcium: 8.8 mg/dL — ABNORMAL LOW (ref 8.9–10.3)
Chloride: 95 mmol/L — ABNORMAL LOW (ref 101–111)
Creatinine, Ser: 8 mg/dL — ABNORMAL HIGH (ref 0.61–1.24)
GFR calc non Af Amer: 6 mL/min — ABNORMAL LOW (ref >60)
GFR, EST AFRICAN AMERICAN: 7 mL/min — AB (ref >60)
GLUCOSE: 111 mg/dL — AB (ref 65–99)
POTASSIUM: 4.2 mmol/L (ref 3.5–5.1)
SODIUM: 137 mmol/L (ref 135–145)
TOTAL PROTEIN: 6.9 g/dL (ref 6.5–8.1)

## 2017-03-05 LAB — CBC WITH DIFFERENTIAL/PLATELET
BASOS ABS: 0 10*3/uL (ref 0.0–0.1)
Basophils Relative: 0 %
EOS ABS: 0 10*3/uL (ref 0.0–0.7)
EOS PCT: 0 %
HCT: 40.2 % (ref 39.0–52.0)
HEMOGLOBIN: 12.6 g/dL — AB (ref 13.0–17.0)
LYMPHS ABS: 0.7 10*3/uL (ref 0.7–4.0)
Lymphocytes Relative: 3 %
MCH: 27.3 pg (ref 26.0–34.0)
MCHC: 31.3 g/dL (ref 30.0–36.0)
MCV: 87.2 fL (ref 78.0–100.0)
Monocytes Absolute: 1.2 10*3/uL — ABNORMAL HIGH (ref 0.1–1.0)
Monocytes Relative: 5 %
NEUTROS PCT: 92 %
Neutro Abs: 22.3 10*3/uL — ABNORMAL HIGH (ref 1.7–7.7)
PLATELETS: 243 10*3/uL (ref 150–400)
RBC: 4.61 MIL/uL (ref 4.22–5.81)
RDW: 19.4 % — ABNORMAL HIGH (ref 11.5–15.5)
WBC: 24.3 10*3/uL — AB (ref 4.0–10.5)

## 2017-03-05 LAB — I-STAT CG4 LACTIC ACID, ED: Lactic Acid, Venous: 2.63 mmol/L (ref 0.5–1.9)

## 2017-03-05 LAB — PROTIME-INR
INR: 1.36
PROTHROMBIN TIME: 16.9 s — AB (ref 11.4–15.2)

## 2017-03-05 MED ORDER — VANCOMYCIN HCL 10 G IV SOLR
1500.0000 mg | Freq: Once | INTRAVENOUS | Status: AC
  Administered 2017-03-06: 1500 mg via INTRAVENOUS
  Filled 2017-03-05: qty 1500

## 2017-03-05 NOTE — ED Notes (Signed)
Pt has repeatedly had a poor attitude toward staff when they enter room to carry out tasks. Pt continually removing BP cuff from arm.

## 2017-03-05 NOTE — ED Notes (Signed)
Dr. Deretha Emory notified of I stat lactic results by B. Bing Plume, EMT

## 2017-03-05 NOTE — ED Triage Notes (Signed)
Caregiver (son) with patient.  Patient has a home health nurse that wrapped left lower leg at the home today.  Caregiver reports home health staff concerned for right toes and visible wounds.  Right foot is very swollen, painful.  Patient has pedal pulses present in right foot.    Dorena Bodo, NP reviewed patient in intake room  Patient to go to ed.

## 2017-03-05 NOTE — ED Triage Notes (Signed)
Pt arrives POV from home. PT son states wounds care nurse came by the house today to change bilateral foot dressings and instructed pt to come here d/t infection in wounds. Pt also states he has abscess formation in left groin post op site. PT states dr said for that to be drained while he was here. PT dialysis pt tts- last dialyzed Saturday. Pt hypotensive In triage

## 2017-03-05 NOTE — ED Notes (Signed)
Patient transported to X-ray 

## 2017-03-05 NOTE — H&P (Signed)
Willie Andrade ZOX:[id number] DOB: 1951-04-16 DOA: 03/05/2017     PCP: [name], MD   Outpatient Specialists: [facility]   Patient coming from:  home Lives With family has home health nurse   Chief Complaint: multiple wounds   HPI: Daishaun Strother is a 66 y.o. male with medical history significant of ESRD , peripheral vascular disease, gangrene of left foot, left groin lymphocele, chronic systolic CHF s/p AICD, hypotension on midodrine,  narcotic abuse, anemia    Presented with possibly worsening of his chronic wounds of the left lower extremity and right toes. Patient has ongoing gangrene and cellulitis of his left lower extremity secondary to severe peripheral vascular disease followed by Dr. Arbie Cookey  s/p left femoral-peroneal bypass 6/27 patient has been admitted in mid July for possible sepsis in a setting of syncope while taking too much Percocet given soft blood pressures and leukocytosis he was started on  vancomycin and Zosyn Was seen inpatient by vascular surgery who will continue to follow up as an outpatient  This patient was also admitted for the same in June from 19th to 29. Patient is on hemodialysis secondary to end-stage renal disease on Tuesday Thursday and Saturday last hemodialysis was Saturday His known chronic systolic CHF EF 15-20% he has status post AICD he has chronic hypotension for which she takes Midodrin. He was seen today by home health nurse who felt that his wounds maybe looking worse and was told to come to emerge department  IN ER:  Temp (24hrs), Avg:97.9 F (36.6 C), Min:97.9 F (36.6 C), Max:97.9 F (36.6 C)      on arrival  ED Triage Vitals  Enc Vitals Group     BP 03/05/17 2200 104/67     Pulse Rate 03/05/17 1715 78     Resp 03/05/17 1715 16     Temp 03/05/17 1715 97.9 F (36.6 C)     Temp Source 03/05/17 1715 Oral     SpO2 03/05/17 1715 98 %     Weight 03/05/17 1723 165 lb (74.8 kg)     Height 03/05/17 1723 6\' 2"  (1.88 m)     Head  Circumference --      Peak Flow --      Pain Score 03/05/17 1723 8     Pain Loc --      Pain Edu? --      Excl. in GC? --    99% HR 91 BP 104/67 Lactic acid 2.63 Sodium 1:30 7K4.2 BUN 35 creatinine 8   Ankle  films showing left ankle and nonspecific diffuse soft tissue swelling likely secondary to chronic venous insufficiency possible cellulitis Right foot osteopenia  Chest x-ray stable nonacute Following Medications were ordered in ER: Medications - No data to display   Hospitalist was called for admission for possible sepsis secondary to chronic wound infection  Review of Systems:    Pertinent positives include: , fatigue  Constitutional:  No weight loss, night sweats, Fevers, chills, weight loss  HEENT:  No headaches, Difficulty swallowing,Tooth/dental problems,Sore throat,  No sneezing, itching, ear ache, nasal congestion, post nasal drip,  Cardio-vascular:  No chest pain, Orthopnea, PND, anasarca, dizziness, palpitations.no Bilateral lower extremity swelling  GI:  No heartburn, indigestion, abdominal pain, nausea, vomiting, diarrhea, change in bowel habits, loss of appetite, melena, blood in stool, hematemesis Resp:  no shortness of breath at rest. No dyspnea on exertion, No excess mucus, no productive cough, No non-productive cough, No coughing up of blood.No change in color  of mucus.No wheezing. Skin:  no rash or lesions. No jaundice GU:  no dysuria, change in color of urine, no urgency or frequency. No straining to urinate.  No flank pain.  Musculoskeletal:  No joint pain or no joint swelling. No decreased range of motion. No back pain.  Psych:  No change in mood or affect. No depression or anxiety. No memory loss.  Neuro: no localizing neurological complaints, no tingling, no weakness, no double vision, no gait abnormality, no slurred speech, no confusion  As per HPI otherwise 10 point review of systems negative.   Past Medical History: Past Medical History:    Diagnosis Date  . Chronic systolic CHF (congestive heart failure), NYHA class 2 (HCC)   . ESRD (end stage renal disease) on dialysis (HCC)   . Hepatitis B   . HTN (hypertension)   . Hyperlipidemia   . 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 Tobacco abuse    Past Surgical History:  Procedure Laterality Date  . ABDOMINAL AORTOGRAM W/LOWER EXTREMITY N/A 01/22/2017   Procedure: Abdominal Aortogram w/Lower Extremity;  Surgeon: [name], MD;  Location: [facility];  Service: Cardiovascular;  Laterality: N/A;  . BIV ICD GENERATOR CHANGEOUT N/A 11/28/2016   Procedure: BiV ICD [employer]; St Jude Surgeon: Will Jorja Loa, MD;  Location: [facility];  Service: Cardiovascular;  Laterality: N/A;  . BYPASS GRAFT FEMORAL-PERONEAL Left 01/24/2017   Procedure: Left Leg  FEMORAL-PERONEAL Bypass Graft;  Surgeon: [name], MD;  Location: [facility] OR;  Service: Vascular;  Laterality: Left;     Social History:       reports that he has been smoking Cigarettes.  He has been smoking about 1.00 pack per day. He has never used smokeless tobacco. He reports that he does not drink alcohol or use drugs.  Allergies:  No Known Allergies     Family History:   Family History  Problem Relation Age of Onset  . Diabetes Mother   . Gout Mother   . Stroke Father   . Gout Maternal Grandmother   . Gout Sister   . Asthma Brother     Medications: Prior to Admission medications   Medication Sig Start Date End Date Taking? Authorizing Provider  B Complex-C-Zn-Folic Acid (DIALYVITE 800/ZINC PO) Take 1 tablet by mouth daily.    [provider]  calcium acetate (PHOSLO) 667 MG capsule Take 3 capsules (2,001 mg total) by mouth 3 (three) times daily with meals. 12/23/16   Clydia Llano, MD  cinacalcet (SENSIPAR) 30 MG tablet Take 6 tablets (180 mg total) by mouth every Tuesday, Thursday, and Saturday at 6 PM. 01/27/17    Edsel Petrin, DO  diphenhydrAMINE (BENADRYL) 25 MG tablet Take 25 mg by mouth every 6 (six) hours as needed for itching.    [provider]  gabapentin (NEURONTIN) 100 MG capsule Take 100 mg by mouth 2 (two) times daily.     [provider]  lidocaine-prilocaine (EMLA) cream Apply 1 application topically See admin instructions. Apply topically to dialysis access every Tuesday, Thursday, Saturday prior to hemodialysis    [provider]  midodrine (PROAMATINE) 5 MG tablet Take 2 tablets (10 mg total) by mouth 3 (three) times daily with meals. 01/28/17   Osvaldo Shipper, MD  Nutritional Supplements (FEEDING SUPPLEMENT, NEPRO CARB STEADY,) LIQD Take 237 mLs by mouth 2 (two) times daily between meals. 01/27/17   Edsel Petrin, DO  oxyCODONE-acetaminophen (PERCOCET/ROXICET) 5-325 MG tablet Take 1-2 tablets by mouth every 6 (six) hours as needed for severe pain. 02/27/17   [name], MD  polyethylene glycol [facility] Ethelene Hal) packet Take 17 g by mouth daily as needed for mild constipation. 01/28/17   Osvaldo Shipper, MD  Salicylic Acid 2 % CREA Apply 1 application topically daily as needed (itching).    [provider]  sevelamer carbonate (RENVELA) 800 MG tablet Take 4 tablets (3,200 mg total) by mouth 3 (three) times daily with meals. Patient not taking: Reported on 02/11/2017 01/26/17   Edsel Petrin, DO  traMADol (ULTRAM) 50 MG tablet Take 1 tablet (50 mg total) by mouth every 6 (six) hours as needed for severe pain. 02/15/17   Charlestine Night, PA-C    Physical Exam: Patient Vitals for the past 24 hrs:  BP Temp Temp src Pulse Resp SpO2 Height Weight  03/05/17 2300 100/65 - - 77 - 99 % - -  03/05/17 2230 101/64 - - - - - - -  03/05/17 2200 104/67 - - - - - - -  03/05/17 1723 - - - - - - 6\' 2"  (1.88 m) 74.8 kg (165 lb)  03/05/17 1715 - 97.9 F (36.6 C) Oral 78 16 98 % - -    1. General:  in No Acute distress 2. Psychological: Alert and    Oriented 3. Head/ENT:     Dry Mucous Membranes                          Head Non traumatic, neck supple                            Poor Dentition 4. SKIN:  decreased Skin turgor,  Skin clean Dry and intact no rash Multiple wounds present on lower extremities left worse than right nonpulsatile groin swelling noted on the left    5. Heart: Regular rate and rhythm no  Murmur, Rub or gallop 6. Lungs:  no wheezes or crackles   7. Abdomen: Soft, non-tender, Non distended 8. Lower extremities: no clubbing, cyanosis, or edema 9. Neurologically Grossly intact, moving all 4 extremities equally   10. MSK: Normal range of motion   body mass index is 21.18 kg/m.  Labs on Admission:   Labs on Admission: I have personally reviewed following labs and imaging studies  CBC:  Recent Labs Lab 03/05/17 1741  WBC 24.3*  NEUTROABS 22.3*  HGB 12.6*  HCT 40.2  MCV 87.2  PLT 243   Basic Metabolic Panel:  Recent Labs Lab 03/05/17 1741  NA 137  K 4.2  CL 95*  CO2 26  GLUCOSE 111*  BUN 35*  CREATININE 8.00*  CALCIUM 8.8*   GFR: Estimated Creatinine Clearance: 9.6 mL/min (A) (by C-G formula based on SCr of 8 mg/dL (H)). Liver Function Tests:  Recent Labs Lab 03/05/17 1741  AST 20  ALT 9*  ALKPHOS 150*  BILITOT 0.9  PROT 6.9  ALBUMIN 2.8*   No results for input(s): LIPASE, AMYLASE in the last 168 hours. No results for input(s): AMMONIA in the last 168 hours. Coagulation Profile:  Recent Labs Lab 03/05/17 1741  INR 1.36   Cardiac Enzymes: No results for input(s): CKTOTAL, CKMB, CKMBINDEX, TROPONINI in the last 168 hours. BNP (last 3 results) No results for input(s): PROBNP in the last 8760 hours. HbA1C: No results for input(s): HGBA1C in the last 72  hours. CBG: No results for input(s): GLUCAP in the last 168 hours. Lipid Profile: No results for input(s): CHOL, HDL, LDLCALC, TRIG, CHOLHDL, LDLDIRECT in the last 72 hours. Thyroid Function Tests: No results for  input(s): TSH, T4TOTAL, FREET4, T3FREE, THYROIDAB in the last 72 hours. Anemia Panel: No results for input(s): VITAMINB12, FOLATE, FERRITIN, TIBC, IRON, RETICCTPCT in the last 72 hours. Urine analysis: No results found for: COLORURINE, APPEARANCEUR, LABSPEC, PHURINE, GLUCOSEU, HGBUR, BILIRUBINUR, KETONESUR, PROTEINUR, UROBILINOGEN, NITRITE, LEUKOCYTESUR Sepsis Labs: @LABRCNTIP (procalcitonin:4,lacticidven:4) )No results found for this or any previous visit (from the past 240 hour(s)).   UA  ordered  No results found for: HGBA1C  Estimated Creatinine Clearance: 9.6 mL/min (A) (by C-G formula based on SCr of 8 mg/dL (H)).  BNP (last 3 results) No results for input(s): PROBNP in the last 8760 hours.   ECG REPORT Not obtained  Filed Weights   03/05/17 1723  Weight: 74.8 kg (165 lb)     Cultures:    Component Value Date/Time   SDES BLOOD RIGHT HAND 02/11/2017 1651   SPECREQUEST  02/11/2017 1651    BOTTLES DRAWN AEROBIC AND ANAEROBIC Blood Culture adequate volume   CULT NO GROWTH 5 DAYS 02/11/2017 1651   REPTSTATUS 02/16/2017 FINAL 02/11/2017 1651     Radiological Exams on Admission: Dg Chest 2 View  Result Date: 03/05/2017 CLINICAL DATA:  Bilateral foot wounds, hypotension and elevated lactic acid level. EXAM: CHEST  2 VIEW COMPARISON:  02/11/2017 FINDINGS: Stable moderate cardiac enlargement and radiographic appearance of a biventricular pacing/ICD device. There is no evidence of pulmonary edema, consolidation, pneumothorax, nodule or pleural fluid. The bony thorax is unremarkable. IMPRESSION: Stable cardiomegaly.  No acute findings in the chest. Electronically Signed   By: Irish Lack M.D.   On: 03/05/2017 18:20   Dg Ankle Complete Left  Result Date: 03/05/2017 CLINICAL DATA:  Possible infection.  Soft tissue swelling. EXAM: LEFT ANKLE COMPLETE - 3+ VIEW COMPARISON:  None. FINDINGS: The bones are demineralized. There is no acute fracture dislocations involving the left ankle.  Dorsal calcaneal enthesophyte is noted. The ankle and subtalar joints are maintained. Vascular calcifications are noted crossing the ankle joint which can be seen with diabetes. Diffuse soft tissue swelling and thickening is noted which may reflect chronic venous insufficiency, third spacing of fluid or possibly stigmata of a cellulitis. No bone destruction is noted. IMPRESSION: 1. Nonspecific diffuse soft tissue swelling of the included left leg and ankle possibly representing chronic venous insufficiency, third spacing of fluid or possibly cellulitis. 2. No evidence of osteomyelitis. 3. Osteopenia. Electronically Signed   By: Tollie Eth M.D.   On: 03/05/2017 20:13   Dg Foot Complete Left  Result Date: 03/05/2017 CLINICAL DATA:  Possible infection EXAM: LEFT FOOT - COMPLETE 3+ VIEW COMPARISON:  None. FINDINGS: Diffuse soft tissue swelling is noted, nonspecific but possibly representing stigmata of cellulitis, third spacing of fluid or possibly from chronic venous insufficiency. Bones are demineralized in appearance without frank bone destruction. Mild degenerative joint space narrowing is seen of the toes. No acute fracture joint dislocations. Dorsal calcaneal enthesophyte is seen. Ankle mortise and subtalar articulations are maintained as are the midfoot joints. IMPRESSION: 1. Osteopenic appearance of the left foot without fracture or frank bone destruction. 2. Soft tissue swelling of the included leg, ankle and foot. Electronically Signed   By: Tollie Eth M.D.   On: 03/05/2017 20:16   Dg Foot Complete Right  Result Date: 03/05/2017 CLINICAL DATA:  Possible infection. EXAM: RIGHT FOOT COMPLETE -  3+ VIEW COMPARISON:  None. FINDINGS: Diffuse soft tissue swelling of the included leg, ankle and foot especially over the dorsum of the forefoot. No underlying bone destruction or fracture. No joint dislocations. Bones are demineralized in appearance. Dorsal calcaneal enthesophyte is seen. IMPRESSION: 1.  Nonspecific soft tissue swelling of the included leg, ankle and foot. Findings could be due to third spacing of fluid, cellulitis or possibly chronic venous insufficiency. 2. No underlying fracture. 3. No evidence of osteomyelitis. Electronically Signed   By: Tollie Eth M.D.   On: 03/05/2017 20:19    Chart has been reviewed    Assessment/Plan   66 y.o. male with medical history significant of ESRD , peripheral vascular disease, gangrene of left foot, left groin lymphocele, chronic systolic CHF s/p AICD, hypotension on midodrine,  narcotic abuse, anemia  Admitted for possible sepsis secondary to ongoing infection of the left foot with chronic green secondary to peripheral vascular disease  Present on Admission: . Sepsis (HCC) she meeting sepsis criteria most likely course pains chronic wound infection continue to cycle lactic acid continue vancomycin await results of blood cultures . PVD (peripheral vascular disease) (HCC) defer to vascular consult in a.m. patient then she will need amputation but had refused in the past . ESRD (end stage renal disease) (HCC) - will notify nephrology  the patient has been dictated he is due to for dialysis on Tuesday . Chronic systolic heart failure (HCC) - avoid fluid overload.  . 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 Pacemaker - stable  . Severe protein-calorie malnutrition (HCC) check prealbumin nutritional consult ordered  . Wound infection - chronic secondary to peripheral vascular disease will need vascular surgery consult in the morning.  -admit per cellulitis protocol will        continue current antibiotic choice      plain films showed:   no evidence of air  no evidence of osteomyelitis     no    foreign   objects         Will obtain MRSA screening,       obtain blood cultures      further antibiotic adjustment pending above results  Hypertension  transient patient has recurrent history of hypotension continue Midodrin  Other plan as per orders.  DVT prophylaxis:  Lovenox     Code Status:  FULL CODE as per patient    Family Communication:   Family not  at  Bedside    Disposition Plan:      To home once workup is complete and patient is stable    Nutrition  consulted                          Consults called: none  Admission status:   inpatient     Level of care      tele           I have spent a total of 56 min on this admission  Carel Carrier 03/06/2017, 1:05 AM    [facility]  Pager [phone]   after 2 AM please page floor coverage PA If 7AM-7PM, please contact the day team taking care of the patient  [email or web address]  Password [email or web address]

## 2017-03-06 ENCOUNTER — Emergency Department (HOSPITAL_COMMUNITY): Payer: Medicare Other

## 2017-03-06 ENCOUNTER — Ambulatory Visit: Payer: Medicare Other | Admitting: Family

## 2017-03-06 ENCOUNTER — Encounter (HOSPITAL_COMMUNITY): Payer: Self-pay | Admitting: General Practice

## 2017-03-06 DIAGNOSIS — E208 Other hypoparathyroidism: Secondary | ICD-10-CM | POA: Diagnosis present

## 2017-03-06 DIAGNOSIS — B1910 Unspecified viral hepatitis B without hepatic coma: Secondary | ICD-10-CM | POA: Diagnosis present

## 2017-03-06 DIAGNOSIS — Z681 Body mass index (BMI) 19 or less, adult: Secondary | ICD-10-CM | POA: Diagnosis not present

## 2017-03-06 DIAGNOSIS — D631 Anemia in chronic kidney disease: Secondary | ICD-10-CM | POA: Diagnosis present

## 2017-03-06 DIAGNOSIS — N186 End stage renal disease: Secondary | ICD-10-CM | POA: Diagnosis present

## 2017-03-06 DIAGNOSIS — E0590 Thyrotoxicosis, unspecified without thyrotoxic crisis or storm: Secondary | ICD-10-CM | POA: Diagnosis present

## 2017-03-06 DIAGNOSIS — T148XXA Other injury of unspecified body region, initial encounter: Secondary | ICD-10-CM | POA: Diagnosis not present

## 2017-03-06 DIAGNOSIS — E43 Unspecified severe protein-calorie malnutrition: Secondary | ICD-10-CM | POA: Diagnosis present

## 2017-03-06 DIAGNOSIS — I96 Gangrene, not elsewhere classified: Secondary | ICD-10-CM | POA: Diagnosis present

## 2017-03-06 DIAGNOSIS — F4320 Adjustment disorder, unspecified: Secondary | ICD-10-CM | POA: Diagnosis not present

## 2017-03-06 DIAGNOSIS — L0889 Other specified local infections of the skin and subcutaneous tissue: Secondary | ICD-10-CM | POA: Diagnosis present

## 2017-03-06 DIAGNOSIS — I9589 Other hypotension: Secondary | ICD-10-CM | POA: Diagnosis present

## 2017-03-06 DIAGNOSIS — F17210 Nicotine dependence, cigarettes, uncomplicated: Secondary | ICD-10-CM | POA: Diagnosis not present

## 2017-03-06 DIAGNOSIS — Z992 Dependence on renal dialysis: Secondary | ICD-10-CM | POA: Diagnosis not present

## 2017-03-06 DIAGNOSIS — L03116 Cellulitis of left lower limb: Secondary | ICD-10-CM | POA: Diagnosis not present

## 2017-03-06 DIAGNOSIS — I132 Hypertensive heart and chronic kidney disease with heart failure and with stage 5 chronic kidney disease, or end stage renal disease: Secondary | ICD-10-CM | POA: Diagnosis present

## 2017-03-06 DIAGNOSIS — N2581 Secondary hyperparathyroidism of renal origin: Secondary | ICD-10-CM | POA: Diagnosis present

## 2017-03-06 DIAGNOSIS — T814XXD Infection following a procedure, subsequent encounter: Secondary | ICD-10-CM | POA: Diagnosis not present

## 2017-03-06 DIAGNOSIS — E785 Hyperlipidemia, unspecified: Secondary | ICD-10-CM | POA: Diagnosis not present

## 2017-03-06 DIAGNOSIS — I872 Venous insufficiency (chronic) (peripheral): Secondary | ICD-10-CM | POA: Diagnosis present

## 2017-03-06 DIAGNOSIS — Z950 Presence of cardiac pacemaker: Secondary | ICD-10-CM | POA: Diagnosis not present

## 2017-03-06 DIAGNOSIS — I70263 Atherosclerosis of native arteries of extremities with gangrene, bilateral legs: Secondary | ICD-10-CM | POA: Diagnosis not present

## 2017-03-06 DIAGNOSIS — L03115 Cellulitis of right lower limb: Secondary | ICD-10-CM | POA: Diagnosis present

## 2017-03-06 DIAGNOSIS — I5022 Chronic systolic (congestive) heart failure: Secondary | ICD-10-CM | POA: Diagnosis present

## 2017-03-06 DIAGNOSIS — Z95810 Presence of automatic (implantable) cardiac defibrillator: Secondary | ICD-10-CM | POA: Diagnosis not present

## 2017-03-06 DIAGNOSIS — G8929 Other chronic pain: Secondary | ICD-10-CM | POA: Diagnosis present

## 2017-03-06 DIAGNOSIS — I9789 Other postprocedural complications and disorders of the circulatory system, not elsewhere classified: Secondary | ICD-10-CM | POA: Diagnosis not present

## 2017-03-06 DIAGNOSIS — T827XXA Infection and inflammatory reaction due to other cardiac and vascular devices, implants and grafts, initial encounter: Secondary | ICD-10-CM | POA: Diagnosis not present

## 2017-03-06 DIAGNOSIS — Y832 Surgical operation with anastomosis, bypass or graft as the cause of abnormal reaction of the patient, or of later complication, without mention of misadventure at the time of the procedure: Secondary | ICD-10-CM | POA: Diagnosis not present

## 2017-03-06 DIAGNOSIS — F919 Conduct disorder, unspecified: Secondary | ICD-10-CM | POA: Diagnosis present

## 2017-03-06 DIAGNOSIS — L089 Local infection of the skin and subcutaneous tissue, unspecified: Secondary | ICD-10-CM | POA: Diagnosis present

## 2017-03-06 DIAGNOSIS — I898 Other specified noninfective disorders of lymphatic vessels and lymph nodes: Secondary | ICD-10-CM | POA: Diagnosis present

## 2017-03-06 DIAGNOSIS — I998 Other disorder of circulatory system: Secondary | ICD-10-CM | POA: Diagnosis not present

## 2017-03-06 DIAGNOSIS — I429 Cardiomyopathy, unspecified: Secondary | ICD-10-CM | POA: Diagnosis present

## 2017-03-06 DIAGNOSIS — I739 Peripheral vascular disease, unspecified: Secondary | ICD-10-CM | POA: Diagnosis not present

## 2017-03-06 DIAGNOSIS — A419 Sepsis, unspecified organism: Secondary | ICD-10-CM | POA: Diagnosis present

## 2017-03-06 DIAGNOSIS — E872 Acidosis: Secondary | ICD-10-CM | POA: Diagnosis present

## 2017-03-06 DIAGNOSIS — B965 Pseudomonas (aeruginosa) (mallei) (pseudomallei) as the cause of diseases classified elsewhere: Secondary | ICD-10-CM | POA: Diagnosis present

## 2017-03-06 LAB — COMPREHENSIVE METABOLIC PANEL
ALBUMIN: 2.5 g/dL — AB (ref 3.5–5.0)
ALT: 8 U/L — ABNORMAL LOW (ref 17–63)
AST: 16 U/L (ref 15–41)
Alkaline Phosphatase: 133 U/L — ABNORMAL HIGH (ref 38–126)
Anion gap: 17 — ABNORMAL HIGH (ref 5–15)
BUN: 36 mg/dL — AB (ref 6–20)
CHLORIDE: 94 mmol/L — AB (ref 101–111)
CO2: 29 mmol/L (ref 22–32)
Calcium: 8.6 mg/dL — ABNORMAL LOW (ref 8.9–10.3)
Creatinine, Ser: 8.48 mg/dL — ABNORMAL HIGH (ref 0.61–1.24)
GFR calc Af Amer: 7 mL/min — ABNORMAL LOW (ref >60)
GFR, EST NON AFRICAN AMERICAN: 6 mL/min — AB (ref >60)
GLUCOSE: 113 mg/dL — AB (ref 65–99)
POTASSIUM: 4.2 mmol/L (ref 3.5–5.1)
Sodium: 140 mmol/L (ref 135–145)
Total Bilirubin: 0.8 mg/dL (ref 0.3–1.2)
Total Protein: 6.3 g/dL — ABNORMAL LOW (ref 6.5–8.1)

## 2017-03-06 LAB — CBC
HEMATOCRIT: 37.9 % — AB (ref 39.0–52.0)
Hemoglobin: 11.9 g/dL — ABNORMAL LOW (ref 13.0–17.0)
MCH: 27 pg (ref 26.0–34.0)
MCHC: 31.4 g/dL (ref 30.0–36.0)
MCV: 86.1 fL (ref 78.0–100.0)
Platelets: 221 10*3/uL (ref 150–400)
RBC: 4.4 MIL/uL (ref 4.22–5.81)
RDW: 19.3 % — AB (ref 11.5–15.5)
WBC: 20.8 10*3/uL — AB (ref 4.0–10.5)

## 2017-03-06 LAB — MRSA PCR SCREENING: MRSA BY PCR: NEGATIVE

## 2017-03-06 LAB — HIV ANTIBODY (ROUTINE TESTING W REFLEX): HIV Screen 4th Generation wRfx: NONREACTIVE

## 2017-03-06 LAB — PREALBUMIN: PREALBUMIN: 12.1 mg/dL — AB (ref 18–38)

## 2017-03-06 LAB — LACTIC ACID, PLASMA
LACTIC ACID, VENOUS: 2.1 mmol/L — AB (ref 0.5–1.9)
LACTIC ACID, VENOUS: 1.6 mmol/L (ref 0.5–1.9)

## 2017-03-06 LAB — APTT: APTT: 43 s — AB (ref 24–36)

## 2017-03-06 LAB — TSH: TSH: 1.733 u[IU]/mL (ref 0.350–4.500)

## 2017-03-06 LAB — MAGNESIUM: Magnesium: 1.9 mg/dL (ref 1.7–2.4)

## 2017-03-06 LAB — PHOSPHORUS: PHOSPHORUS: 5.9 mg/dL — AB (ref 2.5–4.6)

## 2017-03-06 LAB — PROCALCITONIN: PROCALCITONIN: 5.17 ng/mL

## 2017-03-06 MED ORDER — LIDOCAINE HCL (PF) 1 % IJ SOLN: 5.0000 mL | INTRAMUSCULAR | Status: DC | PRN

## 2017-03-06 MED ORDER — SODIUM CHLORIDE 0.9 % IV BOLUS (SEPSIS)
500.0000 mL | Freq: Once | INTRAVENOUS | Status: AC
  Administered 2017-03-06: 500 mL via INTRAVENOUS

## 2017-03-06 MED ORDER — GABAPENTIN 100 MG PO CAPS
100.0000 mg | ORAL_CAPSULE | Freq: Two times a day (BID) | ORAL | Status: DC
  Administered 2017-03-06 – 2017-03-15 (×19): 100 mg via ORAL
  Filled 2017-03-06 (×18): qty 1

## 2017-03-06 MED ORDER — ACETAMINOPHEN 325 MG PO TABS
650.0000 mg | ORAL_TABLET | Freq: Four times a day (QID) | ORAL | Status: DC | PRN
  Administered 2017-03-09 – 2017-03-15 (×2): 650 mg via ORAL
  Filled 2017-03-06 (×2): qty 2

## 2017-03-06 MED ORDER — CEFAZOLIN SODIUM-DEXTROSE 1-4 GM/50ML-% IV SOLN
1.0000 g | INTRAVENOUS | Status: DC
  Filled 2017-03-06: qty 50

## 2017-03-06 MED ORDER — CINACALCET HCL 30 MG PO TABS: 180.0000 mg | ORAL_TABLET | ORAL | Status: DC

## 2017-03-06 MED ORDER — ACETAMINOPHEN 650 MG RE SUPP: 650.0000 mg | Freq: Four times a day (QID) | RECTAL | Status: DC | PRN

## 2017-03-06 MED ORDER — SODIUM CHLORIDE 0.9% FLUSH
3.0000 mL | INTRAVENOUS | Status: DC | PRN
  Administered 2017-03-13: 3 mL via INTRAVENOUS
  Filled 2017-03-06: qty 3

## 2017-03-06 MED ORDER — SENNA 8.6 MG PO TABS
1.0000 | ORAL_TABLET | Freq: Two times a day (BID) | ORAL | Status: DC
  Administered 2017-03-06 – 2017-03-15 (×17): 8.6 mg via ORAL
  Filled 2017-03-06 (×17): qty 1

## 2017-03-06 MED ORDER — SODIUM CHLORIDE 0.9 % IV SOLN: 100.0000 mL | INTRAVENOUS | Status: DC | PRN

## 2017-03-06 MED ORDER — VANCOMYCIN HCL IN DEXTROSE 750-5 MG/150ML-% IV SOLN
750.0000 mg | INTRAVENOUS | Status: DC
  Administered 2017-03-06 – 2017-03-08 (×2): 750 mg via INTRAVENOUS
  Filled 2017-03-06 (×2): qty 150

## 2017-03-06 MED ORDER — VANCOMYCIN HCL IN DEXTROSE 750-5 MG/150ML-% IV SOLN
INTRAVENOUS | Status: AC
  Filled 2017-03-06: qty 150

## 2017-03-06 MED ORDER — POLYETHYLENE GLYCOL 3350 17 G PO PACK
17.0000 g | PACK | Freq: Every day | ORAL | Status: DC | PRN
  Administered 2017-03-12: 17 g via ORAL
  Filled 2017-03-06: qty 1

## 2017-03-06 MED ORDER — SODIUM CHLORIDE 0.9 % IV SOLN
250.0000 mL | INTRAVENOUS | Status: DC | PRN
  Administered 2017-03-06: 250 mL via INTRAVENOUS

## 2017-03-06 MED ORDER — LIDOCAINE-PRILOCAINE 2.5-2.5 % EX CREA: 1.0000 "application " | TOPICAL_CREAM | CUTANEOUS | Status: DC | PRN

## 2017-03-06 MED ORDER — TRAMADOL HCL 50 MG PO TABS
50.0000 mg | ORAL_TABLET | Freq: Four times a day (QID) | ORAL | Status: DC | PRN
  Administered 2017-03-07 – 2017-03-13 (×8): 50 mg via ORAL
  Filled 2017-03-06 (×8): qty 1

## 2017-03-06 MED ORDER — ONDANSETRON HCL 4 MG PO TABS: 4.0000 mg | ORAL_TABLET | Freq: Four times a day (QID) | ORAL | Status: DC | PRN

## 2017-03-06 MED ORDER — SODIUM CHLORIDE 0.9% FLUSH
3.0000 mL | Freq: Two times a day (BID) | INTRAVENOUS | Status: DC
  Administered 2017-03-06 – 2017-03-15 (×17): 3 mL via INTRAVENOUS

## 2017-03-06 MED ORDER — OXYCODONE-ACETAMINOPHEN 5-325 MG PO TABS
1.0000 | ORAL_TABLET | Freq: Four times a day (QID) | ORAL | Status: DC | PRN
  Administered 2017-03-06 – 2017-03-08 (×10): 2 via ORAL
  Administered 2017-03-09: 1 via ORAL
  Administered 2017-03-09 – 2017-03-13 (×10): 2 via ORAL
  Filled 2017-03-06 (×23): qty 2

## 2017-03-06 MED ORDER — NEPRO/CARBSTEADY PO LIQD: 237.0000 mL | Freq: Two times a day (BID) | ORAL | Status: DC

## 2017-03-06 MED ORDER — NEPRO/CARBSTEADY PO LIQD
237.0000 mL | Freq: Two times a day (BID) | ORAL | Status: DC
  Administered 2017-03-06 – 2017-03-15 (×9): 237 mL via ORAL
  Filled 2017-03-06 (×6): qty 237

## 2017-03-06 MED ORDER — CINACALCET HCL 30 MG PO TABS
90.0000 mg | ORAL_TABLET | ORAL | Status: DC
  Administered 2017-03-06 – 2017-03-15 (×4): 90 mg via ORAL
  Filled 2017-03-06 (×4): qty 3

## 2017-03-06 MED ORDER — CALCIUM ACETATE (PHOS BINDER) 667 MG PO CAPS
2001.0000 mg | ORAL_CAPSULE | Freq: Three times a day (TID) | ORAL | Status: DC
  Administered 2017-03-06 – 2017-03-15 (×15): 2001 mg via ORAL
  Filled 2017-03-06 (×22): qty 3

## 2017-03-06 MED ORDER — CALCITRIOL 0.5 MCG PO CAPS
1.2500 ug | ORAL_CAPSULE | ORAL | Status: DC
  Administered 2017-03-06 – 2017-03-10 (×3): 1.25 ug via ORAL
  Filled 2017-03-06 (×2): qty 1

## 2017-03-06 MED ORDER — PENTAFLUOROPROP-TETRAFLUOROETH EX AERO: 1.0000 "application " | INHALATION_SPRAY | CUTANEOUS | Status: DC | PRN

## 2017-03-06 MED ORDER — ENOXAPARIN SODIUM 30 MG/0.3ML ~~LOC~~ SOLN
30.0000 mg | Freq: Every day | SUBCUTANEOUS | Status: DC
  Administered 2017-03-06 – 2017-03-15 (×8): 30 mg via SUBCUTANEOUS
  Filled 2017-03-06 (×8): qty 0.3

## 2017-03-06 MED ORDER — HEPARIN SODIUM (PORCINE) 1000 UNIT/ML DIALYSIS: 1000.0000 [IU] | INTRAMUSCULAR | Status: DC | PRN

## 2017-03-06 MED ORDER — PRO-STAT SUGAR FREE PO LIQD
30.0000 mL | Freq: Two times a day (BID) | ORAL | Status: DC
  Administered 2017-03-06 – 2017-03-22 (×27): 30 mL via ORAL
  Filled 2017-03-06 (×27): qty 30

## 2017-03-06 MED ORDER — MIDODRINE HCL 5 MG PO TABS
10.0000 mg | ORAL_TABLET | Freq: Three times a day (TID) | ORAL | Status: DC
  Administered 2017-03-06 – 2017-03-15 (×27): 10 mg via ORAL
  Filled 2017-03-06 (×25): qty 2

## 2017-03-06 MED ORDER — SEVELAMER CARBONATE 800 MG PO TABS
3200.0000 mg | ORAL_TABLET | Freq: Three times a day (TID) | ORAL | Status: DC
  Administered 2017-03-06 – 2017-03-09 (×6): 3200 mg via ORAL
  Filled 2017-03-06 (×8): qty 4

## 2017-03-06 MED ORDER — ONDANSETRON HCL 4 MG/2ML IJ SOLN: 4.0000 mg | Freq: Four times a day (QID) | INTRAMUSCULAR | Status: DC | PRN

## 2017-03-06 NOTE — Progress Notes (Signed)
Pt refused skin assessment, iniitally refusing labs, refusing to wear gown and refused admission questions. Pt was given 3 graham crackers and was informed of his diet and he was very upset. Pt non-compliant with care. Needed extensive encouragement. Will cont to attend to calls

## 2017-03-06 NOTE — Progress Notes (Signed)
Pharmacy Antibiotic Note  Willie Andrade is a 66 y.o. male with h/o ESRD on HD, admitted on 03/05/2017 with LLE cellulitis/gangrene.  Pharmacy has been consulted for Vancomycin  dosing.  Plan: Vancomycin 1500 mg IV now, then 750 mg IV after each HD  Height: 6\' 2"  (188 cm) Weight: 165 lb (74.8 kg) IBW/kg (Calculated) : 82.2  Temp (24hrs), Avg:97.9 F (36.6 C), Min:97.9 F (36.6 C), Max:97.9 F (36.6 C)   Recent Labs Lab 03/05/17 1741 03/05/17 1750  WBC 24.3*  --   CREATININE 8.00*  --   LATICACIDVEN  --  2.63*    Estimated Creatinine Clearance: 9.6 mL/min (A) (by C-G formula based on SCr of 8 mg/dL (H)).    No Known Allergies   Eddie Candle 03/06/2017 1:27 AM

## 2017-03-06 NOTE — Progress Notes (Signed)
Pt refused physical assessment and refused to measure his wound. He started cursing all the staff and threatened the lab tech. He was provided with snack and a drink and was able to provide blood sample. MRSA swab sent to lab. He was noted to have all his toes necrotic. Dressing to his open wound to his fem pop with seemingly wet to dry dressing intact but refused to have it cleaned or even measured. He showed this undersigned his engorged left side groin perhaps from the previous fem pop. Pt agreed to have iv infusing once he was provided with graham crackers and a drink. Reported to Carla,RN. Assessment and adm procedures remains to be completed.

## 2017-03-06 NOTE — ED Notes (Signed)
Attempted report 

## 2017-03-06 NOTE — Consult Note (Signed)
 Patient name: Willie Andrade MRN: [id number] DOB: 01/26/1951 Sex: male    HPI: Willie Andrade is a 66 y.o. male patient well known to me from prior left femoral to peroneal in situ bypass for attempted limb salvage. Had presented with dry gangrene to the toes of his left foot and some excoriation in his right foot. His bypass was on 01/24/2017. He had been followed in our office. He does have difficulty with chronic pain. He has had aspiration of lymphocele in his left groin on 2 occasions. Discussed this in the office that this would have a high likelihood of recurrence. This has recurred. He is now admitted for multiple additional medical problems including his renal failure  Current Facility-Administered Medications  Medication Dose Route Frequency Provider Last Rate Last Dose  . 0.9 %  sodium chloride infusion  250 mL Intravenous PRN Doutova, Anastassia, MD 10 mL/hr at 03/06/17 0238 250 mL at 03/06/17 0238  . acetaminophen (TYLENOL) tablet 650 mg  650 mg Oral Q6H PRN Doutova, Anastassia, MD       Or  . acetaminophen (TYLENOL) suppository 650 mg  650 mg Rectal Q6H PRN Doutova, Anastassia, MD      . calcium acetate (PHOSLO) capsule 2,001 mg  2,001 mg Oral TID WC Doutova, Anastassia, MD   2,001 mg at 03/06/17 0850  . cinacalcet (SENSIPAR) tablet 180 mg  180 mg Oral Q T,Th,Sat-1800 Doutova, Anastassia, MD      . enoxaparin (LOVENOX) injection 30 mg  30 mg Subcutaneous Daily Doutova, Anastassia, MD   30 mg at 03/06/17 0951  . feeding supplement (NEPRO CARB STEADY) liquid 237 mL  237 mL Oral BID BM Doutova, Anastassia, MD      . gabapentin (NEURONTIN) capsule 100 mg  100 mg Oral BID Doutova, Anastassia, MD   100 mg at 03/06/17 0951  . midodrine (PROAMATINE) tablet 10 mg  10 mg Oral TID WC Doutova, Anastassia, MD   10 mg at 03/06/17 0850  . ondansetron (ZOFRAN) tablet 4 mg  4 mg Oral Q6H PRN Doutova, Anastassia, MD       Or  . ondansetron (ZOFRAN) injection 4  mg  4 mg Intravenous Q6H PRN Doutova, Anastassia, MD      . oxyCODONE-acetaminophen (PERCOCET/ROXICET) 5-325 MG per tablet 1-2 tablet  1-2 tablet Oral Q6H PRN Doutova, Anastassia, MD   2 tablet at 03/06/17 0951  . polyethylene glycol (MIRALAX / GLYCOLAX) packet 17 g  17 g Oral Daily PRN Doutova, Anastassia, MD      . senna (SENOKOT) tablet 8.6 mg  1 tablet Oral BID Doutova, Anastassia, MD   8.6 mg at 03/06/17 0951  . sevelamer carbonate (RENVELA) tablet 3,200 mg  3,200 mg Oral TID WC Doutova, Anastassia, MD   3,200 mg at 03/06/17 0849  . sodium chloride flush (NS) 0.9 % injection 3 mL  3 mL Intravenous Q12H Doutova, Anastassia, MD   3 mL at 03/06/17 0951  . sodium chloride flush (NS) 0.9 % injection 3 mL  3 mL Intravenous PRN Doutova, Anastassia, MD      . traMADol (ULTRAM) tablet 50 mg  50 mg Oral Q6H PRN Doutova, Anastassia, MD      . vancomycin (VANCOCIN) IVPB 750 mg/150 ml premix  750 mg Intravenous Q T,Th,Sa-HD Doutova, Anastassia, MD         PHYSICAL EXAM: Vitals:   03/06/17 0030 03/06/17 0100 03/06/17 0204 03/06/17 0603  BP: 104/67  100/63 103/60  Pulse: 91 95 95 96  Resp:     18 17  Temp:   98.1 F (36.7 C) 98.3 F (36.8 C)  TempSrc:   Oral Oral  SpO2: 99%     Weight:   160 lb 4.8 oz (72.7 kg)   Height:   6' 2" (1.88 m)     GENERAL: The patient is a well-nourished male, in no acute distress. The vital signs are documented above. On physical exam he has had recurrence of his left groin lymphocele. This is tender although he reports tenderness pretty much anywhere he is touched on his body. There is no evidence of erythema. His subcuticular film peroneal graft pulses easily palpable. He continues to have dry gangrenous changes on the toes of his foot with some swelling. He does have some opening of the distal medial vein harvest incision above the level of his tibioperoneal bypass with response to local wound care  MEDICAL ISSUES: Recurrent the lymphocele now with some tenderness.  I recommended evacuation of this in the operating room tomorrow. He is to have hemodialysis today. We'll coordinate this for tomorrow. I'm in the office or one of my partners will be doing the procedure. Explained that there is some likelihood of recurrence even with debridement of the lymphocele and drain placement.   Blanton Kardell F. Domnick Chervenak, MD FACS [facility] Office Tel [phone] Pager [phone]  

## 2017-03-06 NOTE — Progress Notes (Signed)
Initial Nutrition Assessment  DOCUMENTATION CODES:   Severe malnutrition in context of chronic illness  INTERVENTION:   Continue Nepro Shake po BID, each supplement provides 425 kcal and 19 grams protein  Add Pro-Stat 30 mL BID (100 kcals, 15 g of protein per 30 mL)  Reinforced the importance of adequate protein intake for wound healing, ESRD on HD. Reviewed sources of protein, protein intake with all meals/snacks. Reviewed current protein supplements  NUTRITION DIAGNOSIS:   Malnutrition (Severe) related to chronic illness (ESRD on HD, CHF) as evidenced by severe depletion of muscle mass, severe depletion of body fat.  GOAL:   Patient will meet greater than or equal to 90% of their needs  MONITOR:   PO intake, Supplement acceptance, Labs, Weight trends  REASON FOR ASSESSMENT:   Consult Assessment of nutrition requirement/status  ASSESSMENT:    66 yo male admitted with sepsis likely due to chronic wound infection; pt with LLE and R foot cellulitis/dry gangrene of L toes. Pt with ESRD on HD, PVD,gangeren on left foot, CHF   Recorded po intake 100% of meals. Pt reports appetite is good. Reports he eats 3 meals per day even on Dialysis days. Reports breakfast is steak, eggs and toast; lunch is usually a sandwich and dinner varies. Pt reports he receives Pro-Stat supplement at HD center but no other supplements  Pt reports he has not lost any weight recently. EDW 72 kg, current wt 72.2 kg  Nutrition-Focused physical exam completed. Findings are mild/moderate to severe fat depletion, mild/moderate to severe muscle depletion, and mild edema.   Labs: phosphorus 5.9, potassium wdl, corrected calcium 9.8 Meds: phoslo, sensipar, calcitriol, renvela  Diet Order:  Diet regular Room service appropriate? Yes; Fluid consistency: Thin; Fluid restriction: 1200 mL Fluid  Skin:  Wound (see comment) (gangerene on toes, cellulitis)  Last BM:  8/6  Height:   Ht Readings from Last 1  Encounters:  03/06/17 6\' 2"  (1.88 m)    Weight:   Wt Readings from Last 1 Encounters:  03/06/17 160 lb 4.8 oz (72.7 kg)   BMI:  Body mass index is 20.58 kg/m.  Estimated Nutritional Needs:   Kcal:  2190-2555 kcals  Protein:  110-128 g  Fluid:  1000 mL plus UOP  EDUCATION NEEDS:   Education needs addressed   Romelle Starcher MS, RD, LDN [phone] Pager  [phone] Weekend/On-Call Pager

## 2017-03-06 NOTE — Progress Notes (Signed)
Results for [name], [name] (MRN [id number]) as of 03/06/2017 05:35  Ref. Range 03/06/2017 04:08  Lactic Acid, Venous Latest Ref Range: 0.5 - 1.9 mmol/L 2.1 (HH)  NP made aware.

## 2017-03-06 NOTE — Progress Notes (Signed)
[facility]  Patient Status: Active (receiving services up to time of hospitalization)  [facility] is providing the following services: RN and PT  If patient discharges after hours, please call [phone].   Willie Andrade 03/06/2017, 5:45 PM

## 2017-03-06 NOTE — Consult Note (Signed)
Willie Andrade Renal Consultation Note    Indication for Consultation:  Management of ESRD/hemodialysis, anemia, hypertension/volume, and secondary hyperparathyroidism. PCP:  HPI: Willie Andrade is a 66 y.o. male with ESRD, HTN, HFrEF (EF 15-20%, s/p AICD), chronic hypotension (on midodrine), severe PAD (s/p L femoral-peroneal bypass AVG 12/2016), and recurrent L groin lymphocele who has been admitted with concern for sepsis/worsened LLE appearance.  Willie Andrade has known PAD (s/p limb salvage femoral-peroneal bypass in 12/2016) who has chronic dry gangrenous changes to the L toes. He follows with [facility] as outpatient for this. He tells me that he came to the ED due to worsened pain and edema (overall appearance) of the legs and the fact that his lymphocele was larger again. Denies fever or chills. No CP or dyspnea. No N/V/D. No worsened drainage from the L toes. In the ED, found to have WBC 24K with high lactate and procalcitonin. B feet x-rays consistent with cellulitis, no osteomyelitis. He was started initially on Vancomycin.  From a renal standpoint, he dialyzes TTS at [facility]. Last HD was 8/4. Usually reaching his EDW and staying close to prescribed treatment time. Currently uses AVF without recent issues.  Past Medical History:  Diagnosis Date  . Chronic systolic CHF (congestive heart failure), NYHA class 2 (HCC)   . ESRD (end stage renal disease) on dialysis (HCC)   . Hepatitis B   . HTN (hypertension)   . Hyperlipidemia   . 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 Peripheral vascular disease (HCC)   . Protein-calorie malnutrition, severe (HCC)   . Tobacco abuse    Past Surgical History:  Procedure Laterality Date  . ABDOMINAL AORTOGRAM W/LOWER EXTREMITY N/A 01/22/2017   Procedure: Abdominal Aortogram w/Lower Extremity;  Surgeon: [name], MD;  Location: [facility] INVASIVE CV LAB;  Service: Cardiovascular;   Laterality: N/A;  . BIV ICD GENERATOR CHANGEOUT N/A 11/28/2016   Procedure: BiV ICD [employer]; St Jude Surgeon: Will Jorja Loa, MD;  Location: [facility] INVASIVE CV LAB;  Service: Cardiovascular;  Laterality: N/A;  . BYPASS GRAFT FEMORAL-PERONEAL Left 01/24/2017   Procedure: Left Leg  FEMORAL-PERONEAL Bypass Graft;  Surgeon: [name], MD;  Location: [facility] OR;  Service: Vascular;  Laterality: Left;   Family History  Problem Relation Age of Onset  . Diabetes Mother   . Gout Mother   . Stroke Father   . Gout Maternal Grandmother   . Gout Sister   . Asthma Brother    Social History:  reports that he has been smoking Cigarettes.  He has a 40.00 pack-year smoking history. He has never used smokeless tobacco. He reports that he does not drink alcohol or use drugs.  ROS: As per HPI otherwise negative.  Physical Exam: Vitals:   03/06/17 0100 03/06/17 0204 03/06/17 0603 03/06/17 1000  BP:  100/63 103/60 (!) 96/58  Pulse: 95 95 96 81  Resp:  18 17 18   Temp:  98.1 F (36.7 C) 98.3 F (36.8 C) 98.8 F (37.1 C)  TempSrc:  Oral Oral Oral  SpO2:    94%  Weight:  72.7 kg (160 lb 4.8 oz)    Height:  6\' 2"  (1.88 m)       General: Well developed, well nourished, in no acute distress. Head: Normocephalic, atraumatic, sclera non-icteric, mucus membranes are moist. Neck: Supple without lymphadenopathy/masses. JVD not elevated. Lungs: Clear bilaterally to auscultation without wheezes, rales, or rhonchi. Breathing is unlabored. Heart:  RRR with normal S1, S2. No murmurs, rubs, or gallops appreciated. Abdomen: Soft, non-tender, non-distended. Musculoskeletal:  Strength and tone appear normal for age. Lower extremities: BLE with 1+ edema and darkened appearance, + gangrene on L toes, bandaged area to L shin. Did not examine lymphocele. Neuro: Alert and oriented X 3. Moves all extremities spontaneously. Psych:  Responds to questions appropriately with a normal affect. Dialysis Access: L AVF +  thrill  No Known Allergies Prior to Admission medications   Medication Sig Start Date End Date Taking? Authorizing Provider  B Complex-C-Zn-Folic Acid (DIALYVITE 800/ZINC PO) Take 1 tablet by mouth daily.    [provider]  calcium acetate (PHOSLO) 667 MG capsule Take 3 capsules (2,001 mg total) by mouth 3 (three) times daily with meals. 12/23/16   Clydia Llano, MD  cinacalcet (SENSIPAR) 30 MG tablet Take 6 tablets (180 mg total) by mouth every Tuesday, Thursday, and Saturday at 6 PM. 01/27/17   Edsel Petrin, DO  diphenhydrAMINE (BENADRYL) 25 MG tablet Take 25 mg by mouth every 6 (six) hours as needed for itching.    [provider]  gabapentin (NEURONTIN) 100 MG capsule Take 100 mg by mouth 2 (two) times daily.     [provider]  lidocaine-prilocaine (EMLA) cream Apply 1 application topically See admin instructions. Apply topically to dialysis access every Tuesday, Thursday, Saturday prior to hemodialysis    [provider]  midodrine (PROAMATINE) 5 MG tablet Take 2 tablets (10 mg total) by mouth 3 (three) times daily with meals. 01/28/17   Osvaldo Shipper, MD  Nutritional Supplements (FEEDING SUPPLEMENT, NEPRO CARB STEADY,) LIQD Take 237 mLs by mouth 2 (two) times daily between meals. 01/27/17   Edsel Petrin, DO  oxyCODONE-acetaminophen (PERCOCET/ROXICET) 5-325 MG tablet Take 1-2 tablets by mouth every 6 (six) hours as needed for severe pain. 02/27/17   [name], MD  polyethylene glycol [facility] / Ethelene Hal) packet Take 17 g by mouth daily as needed for mild constipation. 01/28/17   Osvaldo Shipper, MD  Salicylic Acid 2 % CREA Apply 1 application topically daily as needed (itching).    [provider]  sevelamer carbonate (RENVELA) 800 MG tablet Take 4 tablets (3,200 mg total) by mouth 3 (three) times daily with meals. Patient not taking: Reported on 02/11/2017 01/26/17   Edsel Petrin, DO  traMADol (ULTRAM) 50 MG tablet Take 1 tablet (50 mg  total) by mouth every 6 (six) hours as needed for severe pain. 02/15/17   Lawyer, Cristal Deer, PA-C   Current Facility-Administered Medications  Medication Dose Route Frequency Provider Last Rate Last Dose  . 0.9 %  sodium chloride infusion  250 mL Intravenous PRN Therisa Doyne, MD 10 mL/hr at 03/06/17 0238 250 mL at 03/06/17 0238  . acetaminophen (TYLENOL) tablet 650 mg  650 mg Oral Q6H PRN Therisa Doyne, MD       Or  . acetaminophen (TYLENOL) suppository 650 mg  650 mg Rectal Q6H PRN Doutova, Anastassia, MD      . calcium acetate (PHOSLO) capsule 2,001 mg  2,001 mg Oral TID WC Doutova, Anastassia, MD   2,001 mg at 03/06/17 0850  . [START ON 03/07/2017] ceFAZolin (ANCEF) IVPB 1 g/50 mL premix  1 g Intravenous On Call Rhyne, Samantha J, PA-C      . cinacalcet (SENSIPAR) tablet 180 mg  180 mg Oral Q T,Th,Sat-1800 Doutova, Anastassia, MD      . enoxaparin (LOVENOX) injection 30 mg  30 mg Subcutaneous Daily Therisa Doyne, MD  30 mg at 03/06/17 0951  . feeding supplement (NEPRO CARB STEADY) liquid 237 mL  237 mL Oral BID BM Doutova, Anastassia, MD      . gabapentin (NEURONTIN) capsule 100 mg  100 mg Oral BID Doutova, Anastassia, MD   100 mg at 03/06/17 0951  . midodrine (PROAMATINE) tablet 10 mg  10 mg Oral TID WC Doutova, Anastassia, MD   10 mg at 03/06/17 0850  . ondansetron (ZOFRAN) tablet 4 mg  4 mg Oral Q6H PRN Doutova, Anastassia, MD       Or  . ondansetron (ZOFRAN) injection 4 mg  4 mg Intravenous Q6H PRN Doutova, Anastassia, MD      . oxyCODONE-acetaminophen (PERCOCET/ROXICET) 5-325 MG per tablet 1-2 tablet  1-2 tablet Oral Q6H PRN Therisa Doyne, MD   2 tablet at 03/06/17 0951  . polyethylene glycol (MIRALAX / GLYCOLAX) packet 17 g  17 g Oral Daily PRN Doutova, Anastassia, MD      . senna (SENOKOT) tablet 8.6 mg  1 tablet Oral BID Therisa Doyne, MD   8.6 mg at 03/06/17 0951  . sevelamer carbonate (RENVELA) tablet 3,200 mg  3,200 mg Oral TID WC Doutova, Anastassia,  MD   3,200 mg at 03/06/17 0849  . sodium chloride flush (NS) 0.9 % injection 3 mL  3 mL Intravenous Q12H Doutova, Anastassia, MD   3 mL at 03/06/17 0951  . sodium chloride flush (NS) 0.9 % injection 3 mL  3 mL Intravenous PRN Doutova, Anastassia, MD      . traMADol (ULTRAM) tablet 50 mg  50 mg Oral Q6H PRN Doutova, Anastassia, MD      . vancomycin (VANCOCIN) IVPB 750 mg/150 ml premix  750 mg Intravenous Q T,Th,Sa-HD Therisa Doyne, MD       Labs: Basic Metabolic Panel:  Recent Labs Lab 03/05/17 1741 03/06/17 0408  NA 137 140  K 4.2 4.2  CL 95* 94*  CO2 26 29  GLUCOSE 111* 113*  BUN 35* 36*  CREATININE 8.00* 8.48*  CALCIUM 8.8* 8.6*  PHOS  --  5.9*   Liver Function Tests:  Recent Labs Lab 03/05/17 1741 03/06/17 0408  AST 20 16  ALT 9* 8*  ALKPHOS 150* 133*  BILITOT 0.9 0.8  PROT 6.9 6.3*  ALBUMIN 2.8* 2.5*   CBC:  Recent Labs Lab 03/05/17 1741 03/06/17 0408  WBC 24.3* 20.8*  NEUTROABS 22.3*  --   HGB 12.6* 11.9*  HCT 40.2 37.9*  MCV 87.2 86.1  PLT 243 221   Studies/Results: Dg Chest 2 View  Result Date: 03/05/2017 CLINICAL DATA:  Bilateral foot wounds, hypotension and elevated lactic acid level. EXAM: CHEST  2 VIEW COMPARISON:  02/11/2017 FINDINGS: Stable moderate cardiac enlargement and radiographic appearance of a biventricular pacing/ICD device. There is no evidence of pulmonary edema, consolidation, pneumothorax, nodule or pleural fluid. The bony thorax is unremarkable. IMPRESSION: Stable cardiomegaly.  No acute findings in the chest. Electronically Signed   By: Irish Lack M.D.   On: 03/05/2017 18:20   Dg Ankle Complete Left  Result Date: 03/05/2017 CLINICAL DATA:  Possible infection.  Soft tissue swelling. EXAM: LEFT ANKLE COMPLETE - 3+ VIEW COMPARISON:  None. FINDINGS: The bones are demineralized. There is no acute fracture dislocations involving the left ankle. Dorsal calcaneal enthesophyte is noted. The ankle and subtalar joints are maintained.  Vascular calcifications are noted crossing the ankle joint which can be seen with diabetes. Diffuse soft tissue swelling and thickening is noted which may reflect chronic venous insufficiency,  third spacing of fluid or possibly stigmata of a cellulitis. No bone destruction is noted. IMPRESSION: 1. Nonspecific diffuse soft tissue swelling of the included left leg and ankle possibly representing chronic venous insufficiency, third spacing of fluid or possibly cellulitis. 2. No evidence of osteomyelitis. 3. Osteopenia. Electronically Signed   By: Tollie Eth M.D.   On: 03/05/2017 20:13   Dg Foot Complete Left  Result Date: 03/05/2017 CLINICAL DATA:  Possible infection EXAM: LEFT FOOT - COMPLETE 3+ VIEW COMPARISON:  None. FINDINGS: Diffuse soft tissue swelling is noted, nonspecific but possibly representing stigmata of cellulitis, third spacing of fluid or possibly from chronic venous insufficiency. Bones are demineralized in appearance without frank bone destruction. Mild degenerative joint space narrowing is seen of the toes. No acute fracture joint dislocations. Dorsal calcaneal enthesophyte is seen. Ankle mortise and subtalar articulations are maintained as are the midfoot joints. IMPRESSION: 1. Osteopenic appearance of the left foot without fracture or frank bone destruction. 2. Soft tissue swelling of the included leg, ankle and foot. Electronically Signed   By: Tollie Eth M.D.   On: 03/05/2017 20:16   Dg Foot Complete Right  Result Date: 03/05/2017 CLINICAL DATA:  Possible infection. EXAM: RIGHT FOOT COMPLETE - 3+ VIEW COMPARISON:  None. FINDINGS: Diffuse soft tissue swelling of the included leg, ankle and foot especially over the dorsum of the forefoot. No underlying bone destruction or fracture. No joint dislocations. Bones are demineralized in appearance. Dorsal calcaneal enthesophyte is seen. IMPRESSION: 1. Nonspecific soft tissue swelling of the included leg, ankle and foot. Findings could be due to  third spacing of fluid, cellulitis or possibly chronic venous insufficiency. 2. No underlying fracture. 3. No evidence of osteomyelitis. Electronically Signed   By: Tollie Eth M.D.   On: 03/05/2017 20:19    Dialysis Orders:  TTS at [facility] 3:45hr, BFR 400, DFR 800, EDW 72kg, 2K/2Ca bath, AVF, Heparin 1400 bolus - Calcitriol 1.25mcg PO q HD - Venofer 100mg  x 10 ordered (4 given so far) for tsat 20% on 7/26. - Was getting sensipar 180mg  PO q HD (held on 7/28 d/t low Ca)  Assessment/Plan: 1.  LLE and R foot cellulitis/dry gangrene L toes: On empiric abx. BCx 8/6 pending. [facility] following, unclear plan for any surgical intervention. In the past, he had adamantly refused toe amputations. 2.  L groin lymphocele: Per [facility] note, plan for surgical evacuation on 8/8. 3.  ESRD: Continue TTS HD schedule, for HD today. No heparin in anticipation of surgery. 4.  BP/volume: Chronic hypotension, on midodrine. + edema on exam, will try to drop EDW slightly. 5.  Anemia: Hgb 11.9. No ESA for now. IV iron not continued inpatient d/t infection. 6.  Metabolic bone disease: Ca ok, Phos slightly high. Continue Phoslo/Renvela as binders, and reduce Sensipar 180 ->90mg  PO TTS. Restart PO calcitriol. 7.  Nutrition: Alb low, continue Nepro. 8.  HFrEF (EF 15-20% with AICD): asymptomatic, per primary.  Ozzie Hoyle, PA-C 03/06/2017, 10:40 AM  [employer] Pager: [phone]  Pt seen, examined and agree w A/P as above. ESRD pt with recurrent L foot infection, hx of gangrene as well and vascular disease.  Plan HD today upstairs.  Will follow.   Vinson Moselle MD [employer] pager [phone]   03/06/2017, 1:59 PM

## 2017-03-06 NOTE — ED Notes (Signed)
Pt refuses to be put on monitor

## 2017-03-06 NOTE — ED Provider Notes (Signed)
[facility]ENCY DEPT Provider Note   CSN: [id number] Arrival date & time: 03/05/17  1614     History   Chief Complaint Chief Complaint  Patient presents with  . Abscess  . Wound Infection    HPI Willie Andrade is a 66 y.o. male.  Patient is a house this patient. Normally he is dialyzed Tuesday Thursdays and Saturdays. He was dialyzed on Saturday. Patient is also followed by vascular surgery. And was seen by them in the past few days. Patient known to have significant ischemia to his left leg. Back in June had a femoral peroneal bypass done by Dr. early. Patient had an admission recently on July 15 for concerns for possible sepsis and left foot gangrene. Patient has a home nurse for wound care. Patient was brought in by family members at the request of the home nurse due to wounds of the left leg appearing worse. Patient also is known to have a swelling in the left groin area. [place] to Dr. Remonia Richter notes from my recent office visit fluid was drawn off of this and it was a seroma. Here today it is not pulsatile there is some slight redness to it no induration. Possibly early infection. Patient and Dr. Remonia Richter note has refused amputation of the left leg.  Patient's past medical significant for hypertension hyperlipidemia chronic systolic heart failure. End-stage renal disease hepatitis B. Past history of narcotic overdose.  Patient without any specific complaints upon arrival. Just states that the nurse told him to come in.  Initial blood pressure taken was 70 systolic. But once he was back here and repeat blood pressure systolic blood pressures been 276 or better. No tachycardia. No documented fever.      Past Medical History:  Diagnosis Date  . Chronic systolic CHF (congestive heart failure), NYHA class 2 (HCC)   . ESRD (end stage renal disease) on dialysis (HCC)   . Hepatitis B   . HTN (hypertension)   . Hyperlipidemia   . 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 Tobacco abuse     Patient Active Problem List   Diagnosis Date Noted  . PVD (peripheral vascular disease) (HCC) 03/05/2017  . Wound infection 03/05/2017  . Narcotic overdose 02/11/2017  . Sepsis (HCC) 02/11/2017  . Severe protein-calorie malnutrition (HCC) 02/11/2017  . General weakness 01/26/2017  . Wound dehiscence 01/26/2017  . Hypotension   . Cellulitis of left foot 01/17/2017  . Cellulitis 01/17/2017  . Gangrene of toe of left foot (HCC)   . Pre-syncope 12/21/2016  . Chronic systolic heart failure (HCC) 10/11/2016  . 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 Pacemaker 10/10/2016  . ESRD (end stage renal disease) (HCC) 02/14/2016    Past Surgical History:  Procedure Laterality Date  . ABDOMINAL AORTOGRAM W/LOWER EXTREMITY N/A 01/22/2017   Procedure: Abdominal Aortogram w/Lower Extremity;  Surgeon: [name], MD;  Location: [facility];  Service: Cardiovascular;  Laterality: N/A;  . BIV ICD GENERATOR CHANGEOUT N/A 11/28/2016   Procedure: BiV ICD [employer]; St Jude Surgeon: Will Jorja Loa, MD;  Location: [facility];  Service: Cardiovascular;  Laterality: N/A;  . BYPASS GRAFT FEMORAL-PERONEAL Left 01/24/2017   Procedure: Left Leg  FEMORAL-PERONEAL Bypass Graft;  Surgeon: [name], MD;  Location: [facility] OR;  Service: Vascular;  Laterality: Left;       Home Medications    Prior to  Admission medications   Medication Sig Start Date End Date Taking? Authorizing Provider  B Complex-C-Zn-Folic Acid (DIALYVITE 800/ZINC PO) Take 1 tablet by mouth daily.    [provider]  calcium acetate (PHOSLO) 667 MG capsule Take 3 capsules (2,001 mg total) by mouth 3 (three) times daily with meals. 12/23/16   Clydia Llano, MD  cinacalcet (SENSIPAR) 30 MG tablet Take 6 tablets (180 mg total) by mouth every Tuesday, Thursday, and Saturday at 6 PM.  01/27/17   Edsel Petrin, DO  diphenhydrAMINE (BENADRYL) 25 MG tablet Take 25 mg by mouth every 6 (six) hours as needed for itching.    [provider]  gabapentin (NEURONTIN) 100 MG capsule Take 100 mg by mouth 2 (two) times daily.     [provider]  lidocaine-prilocaine (EMLA) cream Apply 1 application topically See admin instructions. Apply topically to dialysis access every Tuesday, Thursday, Saturday prior to hemodialysis    [provider]  midodrine (PROAMATINE) 5 MG tablet Take 2 tablets (10 mg total) by mouth 3 (three) times daily with meals. 01/28/17   Osvaldo Shipper, MD  Nutritional Supplements (FEEDING SUPPLEMENT, NEPRO CARB STEADY,) LIQD Take 237 mLs by mouth 2 (two) times daily between meals. 01/27/17   Edsel Petrin, DO  oxyCODONE-acetaminophen (PERCOCET/ROXICET) 5-325 MG tablet Take 1-2 tablets by mouth every 6 (six) hours as needed for severe pain. 02/27/17   [name], MD  polyethylene glycol [facility]) packet Take 17 g by mouth daily as needed for mild constipation. 01/28/17   Osvaldo Shipper, MD  Salicylic Acid 2 % CREA Apply 1 application topically daily as needed (itching).    [provider]  sevelamer carbonate (RENVELA) 800 MG tablet Take 4 tablets (3,200 mg total) by mouth 3 (three) times daily with meals. Patient not taking: Reported on 02/11/2017 01/26/17   Edsel Petrin, DO  traMADol (ULTRAM) 50 MG tablet Take 1 tablet (50 mg total) by mouth every 6 (six) hours as needed for severe pain. 02/15/17   Charlestine Night, PA-C    Family History Family History  Problem Relation Age of Onset  . Diabetes Mother   . Gout Mother   . Stroke Father   . Gout Maternal Grandmother   . Gout Sister   . Asthma Brother     Social History Social History  Substance Use Topics  . Smoking status: Current Every Day Smoker    Packs/day: 1.00    Types: Cigarettes  . Smokeless tobacco: Never Used  . Alcohol use No      Allergies   Patient has no known allergies.   Review of Systems Review of Systems  Constitutional: Negative for fever.  HENT: Negative for congestion.   Eyes: Positive for redness.  Respiratory: Negative for shortness of breath.   Cardiovascular: Negative for chest pain.  Gastrointestinal: Negative for abdominal pain.  Genitourinary: Negative for dysuria.  Musculoskeletal: Negative for back pain.  Skin: Positive for wound.  Neurological: Negative for headaches.  Hematological: Does not bruise/bleed easily.  Psychiatric/Behavioral: Negative for confusion.     Physical Exam Updated Vital Signs BP 100/65   Pulse 77   Temp 97.9 F (36.6 C) (Oral)   Resp 16   Ht 1.88 m (6\' 2" )   Wt 74.8 kg (165 lb)   SpO2 99%   BMI 21.18 kg/m   Physical Exam  Constitutional: He is oriented to person, place, and time. He appears well-developed and well-nourished. No distress.  HENT:  Head: Normocephalic and atraumatic.  Mucous membranes slightly dry.  Eyes: Pupils are equal, round, and reactive to light. EOM are normal.  Neck: Neck supple.  Cardiovascular: Normal rate and regular rhythm.   Pulmonary/Chest: Effort normal and breath sounds normal.  Abdominal: Soft. Bowel sounds are normal. There is no tenderness.  Musculoskeletal:  Fistula right arm good thrill. A left groin area with 2 separate swellings both soft nontender both measuring about 3 x 4 cm. The more distal one in the in the groin area has slight erythema no induration. No significant tenderness. Not pulsatile.  Right foot with the evidence of ischemia to the toes with some wound breakdown around toenails. Left foot and leg with darkened skin no palpable soft tissue gas. Packed anterior wound anterior thigh. Also the wounds to the distal part of the foot. Suggestive of chronic ischemic extremity.  Neurological: He is alert and oriented to person, place, and time. No cranial nerve deficit. He exhibits normal muscle tone.  Coordination normal.  Skin: Skin is warm.  Nursing note and vitals reviewed.    ED Treatments / Results  Labs (all labs ordered are listed, but only abnormal results are displayed) Labs Reviewed  COMPREHENSIVE METABOLIC PANEL - Abnormal; Notable for the following:       Result Value   Chloride 95 (*)    Glucose, Bld 111 (*)    BUN 35 (*)    Creatinine, Ser 8.00 (*)    Calcium 8.8 (*)    Albumin 2.8 (*)    ALT 9 (*)    Alkaline Phosphatase 150 (*)    GFR calc non Af Amer 6 (*)    GFR calc Af Amer 7 (*)    Anion gap 16 (*)    All other components within normal limits  CBC WITH DIFFERENTIAL/PLATELET - Abnormal; Notable for the following:    WBC 24.3 (*)    Hemoglobin 12.6 (*)    RDW 19.4 (*)    Neutro Abs 22.3 (*)    Monocytes Absolute 1.2 (*)    All other components within normal limits  PROTIME-INR - Abnormal; Notable for the following:    Prothrombin Time 16.9 (*)    All other components within normal limits  I-STAT CG4 LACTIC ACID, ED - Abnormal; Notable for the following:    Lactic Acid, Venous 2.63 (*)    All other components within normal limits  CULTURE, BLOOD (ROUTINE X 2)  CULTURE, BLOOD (ROUTINE X 2)  URINALYSIS, ROUTINE W REFLEX MICROSCOPIC  LACTIC ACID, PLASMA  LACTIC ACID, PLASMA  PROCALCITONIN  APTT  HIV ANTIBODY (ROUTINE TESTING)  I-STAT CG4 LACTIC ACID, ED    EKG  EKG Interpretation None       Radiology Dg Chest 2 View  Result Date: 03/05/2017 CLINICAL DATA:  Bilateral foot wounds, hypotension and elevated lactic acid level. EXAM: CHEST  2 VIEW COMPARISON:  02/11/2017 FINDINGS: Stable moderate cardiac enlargement and radiographic appearance of a biventricular pacing/ICD device. There is no evidence of pulmonary edema, consolidation, pneumothorax, nodule or pleural fluid. The bony thorax is unremarkable. IMPRESSION: Stable cardiomegaly.  No acute findings in the chest. Electronically Signed   By: Irish Lack M.D.   On: 03/05/2017 18:20   Dg  Ankle Complete Left  Result Date: 03/05/2017 CLINICAL DATA:  Possible infection.  Soft tissue swelling. EXAM: LEFT ANKLE COMPLETE - 3+ VIEW COMPARISON:  None. FINDINGS: The bones are demineralized. There is no acute fracture dislocations involving the left ankle. Dorsal calcaneal enthesophyte is noted. The ankle and subtalar  joints are maintained. Vascular calcifications are noted crossing the ankle joint which can be seen with diabetes. Diffuse soft tissue swelling and thickening is noted which may reflect chronic venous insufficiency, third spacing of fluid or possibly stigmata of a cellulitis. No bone destruction is noted. IMPRESSION: 1. Nonspecific diffuse soft tissue swelling of the included left leg and ankle possibly representing chronic venous insufficiency, third spacing of fluid or possibly cellulitis. 2. No evidence of osteomyelitis. 3. Osteopenia. Electronically Signed   By: Tollie Eth M.D.   On: 03/05/2017 20:13   Dg Foot Complete Left  Result Date: 03/05/2017 CLINICAL DATA:  Possible infection EXAM: LEFT FOOT - COMPLETE 3+ VIEW COMPARISON:  None. FINDINGS: Diffuse soft tissue swelling is noted, nonspecific but possibly representing stigmata of cellulitis, third spacing of fluid or possibly from chronic venous insufficiency. Bones are demineralized in appearance without frank bone destruction. Mild degenerative joint space narrowing is seen of the toes. No acute fracture joint dislocations. Dorsal calcaneal enthesophyte is seen. Ankle mortise and subtalar articulations are maintained as are the midfoot joints. IMPRESSION: 1. Osteopenic appearance of the left foot without fracture or frank bone destruction. 2. Soft tissue swelling of the included leg, ankle and foot. Electronically Signed   By: Tollie Eth M.D.   On: 03/05/2017 20:16   Dg Foot Complete Right  Result Date: 03/05/2017 CLINICAL DATA:  Possible infection. EXAM: RIGHT FOOT COMPLETE - 3+ VIEW COMPARISON:  None. FINDINGS: Diffuse soft  tissue swelling of the included leg, ankle and foot especially over the dorsum of the forefoot. No underlying bone destruction or fracture. No joint dislocations. Bones are demineralized in appearance. Dorsal calcaneal enthesophyte is seen. IMPRESSION: 1. Nonspecific soft tissue swelling of the included leg, ankle and foot. Findings could be due to third spacing of fluid, cellulitis or possibly chronic venous insufficiency. 2. No underlying fracture. 3. No evidence of osteomyelitis. Electronically Signed   By: Tollie Eth M.D.   On: 03/05/2017 20:19    Procedures Procedures (including critical care time)  Medications Ordered in ED Medications  vancomycin (VANCOCIN) 1,500 mg in sodium chloride 0.9 % 500 mL IVPB (not administered)     Initial Impression / Assessment and Plan / ED Course  I have reviewed the triage vital signs and the nursing notes.  Pertinent labs & imaging results that were available during my care of the patient were reviewed by me and considered in my medical decision making (see chart for details).     Call placed to vascular surgery for consultation on the patient. Patient will be admitted by hospitalist team. Patient without any significant parameters of sepsis other than elevated lactic acid and marked leukocytosis. Not tachycardic not febrile only had one blood pressure that was in the 70s but feel that this was not accurate. Blood pressure since that time been systolic of 100.  X-rays of the right foot and left ankle and left foot without evidence of any gas or osteomyelitis.  Patient initially was was refusing admission but changed his mind and agreed to be admitted.  Patient will be started on vancomycin. Patient will be admitted by the hospitalist team. Vasculature surgery has not called back.  As stated above patient's lactic acid was elevated above 2 but was less than 3.    Final Clinical Impressions(s) / ED Diagnoses   Final diagnoses:  Wound infection    Ischemia of left lower extremity  Cellulitis of left lower extremity    New Prescriptions New Prescriptions   No  medications on file     Vanetta Mulders, MD 03/06/17 [phone]

## 2017-03-06 NOTE — Consult Note (Addendum)
WOC Nurse wound consult note Reason for Consult: Consult requested for left leg and foot. Pt has been reluctant to have wound assessed or dressing changed, according to the EMR.  He states Dr Arbie Cookey of the vascular team was in earlier this am. Wound type: Toes with dry stable black eschar; no odor, drainage, or open wounds. Measurement: Left calf with chronic full thickness wound; 7X1.2X.3cm; 70% red, 30% yellow, mod amt green-tinged drainage, no odor. Left anterior ankle with partial thickness wound; 1.5X1.5X.1cm, red and dry, no odor or drainage. 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 Area is currently very swollen and tender. Dressing procedure/placement/frequency: Moist gauze dressing to left leg, foam dressing to ankle to protect from further injury. Topical treatment will not be effective to promote healing for toes. Discussed plan of care with patient and he verbalized understanding. Please re-consult if further assistance is needed.  Thank-you,  Cammie Mcgee MSN, RN, CWOCN, [place], CNS [phone]

## 2017-03-06 NOTE — ED Notes (Signed)
Dr. Adela Glimpse in to assess pt at this time

## 2017-03-06 NOTE — ED Notes (Signed)
Dr. Adela Glimpse made aware that pt refuses cardiac monitor

## 2017-03-06 NOTE — Progress Notes (Signed)
PROGRESS NOTE    Willie Andrade  JJK:[id number] DOB: [birth year] DOA: 03/05/2017 PCP: [name], MD  Outpatient Specialists:     Brief Narrative:  66 y/o ? 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  LLE fem-pop bypass 6/25  Still necrotic changes as well as open wound at distal medial harvest area--Appreciate woc nurse input  Chr full thickness wound to be dressed with moist gz + foam dressing  No Current concern for osteomyelitis on x-rays--patient will need close monitoring--I'm not sure with continued smoking if the patient will be able to keep his lower extremity blood supply patent  End-stage renal disease Tuesday Thursday Saturday Chronic hypotension associated with dialysis--continue Midodrine 10 mg 3 times a day Anemia of renal disease Secondary hypoparathyroidism  Appreciate nephrology seeing patient in consult  Hemoglobin 11.9-defer use of EPO,  Phosphorous 5.9, calcium 8.6, may need to use a lower dose of binder?   Continue calcitriol 1.25 3 times a day  Patient  will be noncompliant with renal diet-he is asking for crab steak and shrimp   I have explained to him clearly that I'm not sure we can obtain these settings within the hospital setting  Chronic systolic heart failure Cardiomyopathy EF 15% 12/23/16 PSP 50 mmHg  Volume management as per dialysis  Chronic pain  Etiology unclear  Minimize tramadol and Percocet going forward   Lovenox Inpatient Telemetry 35 minutes  Consultants:   nephro  vasc  Procedures:   None yet  Antimicrobials:   Vancomycin  Ancef    Subjective:  Alert no distress  Complaining about the food Belligerent to nursing staff  Objective: Vitals:   03/06/17 0030 03/06/17 0100 03/06/17 0204 03/06/17 0603  BP: 104/67  100/63 103/60  Pulse: 91 95 95 96  Resp:   18 17  Temp:   98.1 F (36.7 C) 98.3 F (36.8 C)  TempSrc:   Oral Oral  SpO2: 99%     Weight:   72.7 kg (160 lb 4.8 oz)   Height:   6\' 2"  (1.88 m)     Intake/Output Summary (Last 24 hours) at 03/06/17 0731 Last data filed at 03/06/17 0604  Gross per 24 hour  Intake              690 ml  Output                0 ml  Net              690 ml   Filed Weights   03/05/17 1723 03/06/17 3716  Weight: 74.8 kg (165 lb) 72.7 kg (160 lb 4.8 oz)    Examination:  Awake and disgruntled No pallor no exudate trace-disheveled S1-S2 slightly tachycardic Abdomen soft nontender-I did not examine lymphoceles today Bilateral foot darkening with bandages and wound supplies between right second and third toes Feet are swollen with lower extremity edema and are painful to touch Chest is clinically clear Neurologically is intact moves all 4 limbs equally    Data Reviewed: I have personally reviewed following labs and imaging studies  CBC:  Recent Labs Lab 03/05/17 1741 03/06/17 0408  WBC 24.3* 20.8*  NEUTROABS 22.3*  --   HGB 12.6* 11.9*  HCT 40.2 37.9*  MCV 87.2 86.1  PLT 243 221   Basic Metabolic Panel:  Recent Labs Lab  03/05/17 1741 03/06/17 0408  NA 137 140  K 4.2 4.2  CL 95* 94*  CO2 26 29  GLUCOSE 111* 113*  BUN 35* 36*  CREATININE 8.00* 8.48*  CALCIUM 8.8* 8.6*  MG  --  1.9  PHOS  --  5.9*   GFR: Estimated Creatinine Clearance: 8.8 mL/min (A) (by C-G formula based on SCr of 8.48 mg/dL (H)). Liver Function Tests:  Recent Labs Lab 03/05/17 1741 03/06/17 0408  AST 20 16  ALT 9* 8*  ALKPHOS 150* 133*  BILITOT 0.9 0.8  PROT 6.9 6.3*  ALBUMIN 2.8* 2.5*   No results for input(s): LIPASE, AMYLASE in the last 168 hours. No results for input(s): AMMONIA in the last 168 hours. Coagulation Profile:  Recent Labs Lab 03/05/17 1741  INR 1.36   Cardiac Enzymes: No results for input(s): CKTOTAL, CKMB, CKMBINDEX, TROPONINI in the last 168 hours. BNP (last 3 results) No results for input(s): PROBNP in the last 8760 hours. HbA1C: No results for input(s): HGBA1C in the last 72 hours. CBG: No results for input(s): GLUCAP in the last 168 hours. Lipid Profile: No results for input(s): CHOL, HDL, LDLCALC, TRIG, CHOLHDL, LDLDIRECT in the last 72 hours. Thyroid Function Tests:  Recent Labs  03/06/17 0408  TSH 1.733   Anemia Panel: No results for input(s): VITAMINB12, FOLATE, FERRITIN, TIBC, IRON, RETICCTPCT in the last 72 hours. Urine analysis: No results found for: COLORURINE, APPEARANCEUR, LABSPEC, PHURINE, GLUCOSEU, HGBUR, BILIRUBINUR, KETONESUR, PROTEINUR, UROBILINOGEN, NITRITE, LEUKOCYTESUR Sepsis Labs: @LABRCNTIP (procalcitonin:4,lacticidven:4)  )No results found for this or any previous visit (from the past 240 hour(s)).       Radiology Studies: Dg Chest 2 View  Result Date: 03/05/2017 CLINICAL DATA:  Bilateral foot wounds, hypotension and elevated lactic acid level. EXAM: CHEST  2 VIEW COMPARISON:  02/11/2017 FINDINGS: Stable moderate cardiac enlargement and radiographic appearance of a biventricular pacing/ICD device. There is no evidence of pulmonary edema, consolidation,  pneumothorax, nodule or pleural fluid. The bony thorax is unremarkable. IMPRESSION: Stable cardiomegaly.  No acute findings in the chest. Electronically Signed   By: Irish Lack M.D.   On: 03/05/2017 18:20   Dg Ankle Complete Left  Result Date: 03/05/2017 CLINICAL DATA:  Possible infection.  Soft tissue swelling. EXAM: LEFT ANKLE COMPLETE - 3+ VIEW COMPARISON:  None. FINDINGS: The bones are demineralized. There is no acute fracture dislocations involving the left ankle. Dorsal calcaneal enthesophyte is noted. The ankle and subtalar joints are maintained. Vascular calcifications are noted crossing the ankle joint which can be seen with diabetes. Diffuse soft tissue swelling and thickening is noted which may reflect chronic venous insufficiency, third spacing of fluid or possibly stigmata of a cellulitis. No bone destruction is noted. IMPRESSION: 1. Nonspecific  diffuse soft tissue swelling of the included left leg and ankle possibly representing chronic venous insufficiency, third spacing of fluid or possibly cellulitis. 2. No evidence of osteomyelitis. 3. Osteopenia. Electronically Signed   By: Tollie Eth M.D.   On: 03/05/2017 20:13   Dg Foot Complete Left  Result Date: 03/05/2017 CLINICAL DATA:  Possible infection EXAM: LEFT FOOT - COMPLETE 3+ VIEW COMPARISON:  None. FINDINGS: Diffuse soft tissue swelling is noted, nonspecific but possibly representing stigmata of cellulitis, third spacing of fluid or possibly from chronic venous insufficiency. Bones are demineralized in appearance without frank bone destruction. Mild degenerative joint space narrowing is seen of the toes. No acute fracture joint dislocations. Dorsal calcaneal enthesophyte is seen. Ankle mortise and subtalar articulations are maintained as are the midfoot joints. IMPRESSION: 1. Osteopenic appearance of the left foot without fracture or frank bone destruction. 2. Soft tissue swelling of the included leg, ankle and foot. Electronically  Signed   By: Tollie Eth M.D.   On: 03/05/2017 20:16   Dg Foot Complete Right  Result Date: 03/05/2017 CLINICAL DATA:  Possible infection. EXAM: RIGHT FOOT COMPLETE - 3+ VIEW COMPARISON:  None. FINDINGS: Diffuse soft tissue swelling of the included leg, ankle and foot especially over the dorsum of the forefoot. No underlying bone destruction or fracture. No joint dislocations. Bones are demineralized in appearance. Dorsal calcaneal enthesophyte is seen. IMPRESSION: 1. Nonspecific soft tissue swelling of the included leg, ankle and foot. Findings could be due to third spacing of fluid, cellulitis or possibly chronic venous insufficiency. 2. No underlying fracture. 3. No evidence of osteomyelitis. Electronically Signed   By: Tollie Eth M.D.   On: 03/05/2017 20:19        Scheduled Meds: . calcitRIOL  1.25 mcg Oral Once per day on Tue Thu Sat  . calcium acetate  2,001 mg Oral TID WC  . cinacalcet  90 mg Oral Q T,Th,Sat-1800  . enoxaparin (LOVENOX) injection  30 mg Subcutaneous Daily  . feeding supplement (NEPRO CARB STEADY)  237 mL Oral BID BM  . gabapentin  100 mg Oral BID  . midodrine  10 mg Oral TID WC  . senna  1 tablet Oral BID  . sevelamer carbonate  3,200 mg Oral TID WC  . sodium chloride flush  3 mL Intravenous Q12H   Continuous Infusions: . sodium chloride 250 mL (03/06/17 0238)  . vancomycin       LOS: 0 days    Time spent: 56    Pleas Koch, MD [facility]   If 7PM-7AM, please contact night-coverage [email or web address] Password [facility] 03/06/2017, 7:31 AM

## 2017-03-07 ENCOUNTER — Inpatient Hospital Stay (HOSPITAL_COMMUNITY): Payer: Medicare Other | Admitting: Certified Registered Nurse Anesthetist

## 2017-03-07 ENCOUNTER — Encounter (HOSPITAL_COMMUNITY)
Admission: EM | Disposition: A | Discharge status: Home / Self Care | Payer: Self-pay | Source: Home / Self Care | Attending: Internal Medicine

## 2017-03-07 ENCOUNTER — Encounter (HOSPITAL_COMMUNITY): Payer: Self-pay | Admitting: Certified Registered Nurse Anesthetist

## 2017-03-07 ENCOUNTER — Encounter: Payer: Medicare Other | Admitting: Cardiology

## 2017-03-07 DIAGNOSIS — I898 Other specified noninfective disorders of lymphatic vessels and lymph nodes: Secondary | ICD-10-CM

## 2017-03-07 HISTORY — PX: DRAINAGE AND CLOSURE OF LYMPHOCELE: SHX5800

## 2017-03-07 LAB — BASIC METABOLIC PANEL
Anion gap: 14 (ref 5–15)
BUN: 28 mg/dL — AB (ref 6–20)
CALCIUM: 8.2 mg/dL — AB (ref 8.9–10.3)
CHLORIDE: 92 mmol/L — AB (ref 101–111)
CO2: 30 mmol/L (ref 22–32)
CREATININE: 6.3 mg/dL — AB (ref 0.61–1.24)
GFR calc non Af Amer: 8 mL/min — ABNORMAL LOW (ref >60)
GFR, EST AFRICAN AMERICAN: 10 mL/min — AB (ref >60)
Glucose, Bld: 90 mg/dL (ref 65–99)
Potassium: 3.8 mmol/L (ref 3.5–5.1)
SODIUM: 136 mmol/L (ref 135–145)

## 2017-03-07 LAB — CBC
HEMATOCRIT: 38.8 % — AB (ref 39.0–52.0)
Hemoglobin: 11.9 g/dL — ABNORMAL LOW (ref 13.0–17.0)
MCH: 26.9 pg (ref 26.0–34.0)
MCHC: 30.7 g/dL (ref 30.0–36.0)
MCV: 87.6 fL (ref 78.0–100.0)
Platelets: 260 10*3/uL (ref 150–400)
RBC: 4.43 MIL/uL (ref 4.22–5.81)
RDW: 19.9 % — AB (ref 11.5–15.5)
WBC: 18.9 10*3/uL — ABNORMAL HIGH (ref 4.0–10.5)

## 2017-03-07 LAB — PROTIME-INR
INR: 1.29
PROTHROMBIN TIME: 16.2 s — AB (ref 11.4–15.2)

## 2017-03-07 LAB — SURGICAL PCR SCREEN
MRSA, PCR: NEGATIVE
Staphylococcus aureus: NEGATIVE

## 2017-03-07 LAB — HEPATITIS B SURFACE ANTIGEN: Hepatitis B Surface Ag: NEGATIVE

## 2017-03-07 SURGERY — DRAINAGE AND CLOSURE OF LYMPHOCELE
Anesthesia: General | Site: Groin | Laterality: Left

## 2017-03-07 MED ORDER — PENTAFLUOROPROP-TETRAFLUOROETH EX AERO: 1.0000 "application " | INHALATION_SPRAY | CUTANEOUS | Status: DC | PRN

## 2017-03-07 MED ORDER — MIDAZOLAM HCL 5 MG/5ML IJ SOLN
INTRAMUSCULAR | Status: DC | PRN
  Administered 2017-03-07: 1 mg via INTRAVENOUS

## 2017-03-07 MED ORDER — LIDOCAINE 2% (20 MG/ML) 5 ML SYRINGE
INTRAMUSCULAR | Status: DC | PRN
  Administered 2017-03-07: 60 mg via INTRAVENOUS

## 2017-03-07 MED ORDER — PHENYLEPHRINE 40 MCG/ML (10ML) SYRINGE FOR IV PUSH (FOR BLOOD PRESSURE SUPPORT)
PREFILLED_SYRINGE | INTRAVENOUS | Status: AC
  Filled 2017-03-07: qty 10

## 2017-03-07 MED ORDER — LIDOCAINE 2% (20 MG/ML) 5 ML SYRINGE
INTRAMUSCULAR | Status: AC
  Filled 2017-03-07: qty 5

## 2017-03-07 MED ORDER — SODIUM CHLORIDE 0.9 % IV SOLN
INTRAVENOUS | Status: DC | PRN
  Administered 2017-03-07: 08:00:00 via INTRAVENOUS

## 2017-03-07 MED ORDER — HEPARIN SODIUM (PORCINE) 1000 UNIT/ML DIALYSIS: 1000.0000 [IU] | INTRAMUSCULAR | Status: DC | PRN

## 2017-03-07 MED ORDER — PROMETHAZINE HCL 25 MG/ML IJ SOLN: 6.2500 mg | INTRAMUSCULAR | Status: DC | PRN

## 2017-03-07 MED ORDER — PROPOFOL 10 MG/ML IV BOLUS
INTRAVENOUS | Status: AC
  Filled 2017-03-07: qty 20

## 2017-03-07 MED ORDER — SODIUM CHLORIDE 0.9 % IV SOLN: 100.0000 mL | INTRAVENOUS | Status: DC | PRN

## 2017-03-07 MED ORDER — 0.9 % SODIUM CHLORIDE (POUR BTL) OPTIME
TOPICAL | Status: DC | PRN
  Administered 2017-03-07: 1000 mL

## 2017-03-07 MED ORDER — PROPOFOL 10 MG/ML IV BOLUS
INTRAVENOUS | Status: DC | PRN
  Administered 2017-03-07: 140 mg via INTRAVENOUS

## 2017-03-07 MED ORDER — HEPARIN SODIUM (PORCINE) 1000 UNIT/ML DIALYSIS: 1000.0000 [IU] | Freq: Once | INTRAMUSCULAR | Status: DC

## 2017-03-07 MED ORDER — HEMOSTATIC AGENTS (NO CHARGE) OPTIME
TOPICAL | Status: DC | PRN
  Administered 2017-03-07: 1 via TOPICAL

## 2017-03-07 MED ORDER — FENTANYL CITRATE (PF) 250 MCG/5ML IJ SOLN
INTRAMUSCULAR | Status: AC
  Filled 2017-03-07: qty 5

## 2017-03-07 MED ORDER — LIDOCAINE HCL (PF) 1 % IJ SOLN: 5.0000 mL | INTRAMUSCULAR | Status: DC | PRN

## 2017-03-07 MED ORDER — FENTANYL CITRATE (PF) 100 MCG/2ML IJ SOLN: 25.0000 ug | INTRAMUSCULAR | Status: DC | PRN

## 2017-03-07 MED ORDER — LIDOCAINE-PRILOCAINE 2.5-2.5 % EX CREA: 1.0000 "application " | TOPICAL_CREAM | CUTANEOUS | Status: DC | PRN

## 2017-03-07 MED ORDER — CEFAZOLIN SODIUM-DEXTROSE 1-4 GM/50ML-% IV SOLN
INTRAVENOUS | Status: DC | PRN
  Administered 2017-03-07: 1 g via INTRAVENOUS

## 2017-03-07 MED ORDER — SODIUM CHLORIDE 0.9 % IR SOLN
Status: DC | PRN
  Administered 2017-03-07: 3000 mL

## 2017-03-07 MED ORDER — MEPERIDINE HCL 25 MG/ML IJ SOLN: 6.2500 mg | INTRAMUSCULAR | Status: DC | PRN

## 2017-03-07 MED ORDER — ALTEPLASE 2 MG IJ SOLR: 2.0000 mg | Freq: Once | INTRAMUSCULAR | Status: DC | PRN

## 2017-03-07 MED ORDER — MIDAZOLAM HCL 2 MG/2ML IJ SOLN
INTRAMUSCULAR | Status: AC
  Filled 2017-03-07: qty 2

## 2017-03-07 MED ORDER — PHENYLEPHRINE HCL 10 MG/ML IJ SOLN
INTRAMUSCULAR | Status: DC | PRN
  Administered 2017-03-07 (×3): 80 ug via INTRAVENOUS
  Administered 2017-03-07: 40 ug via INTRAVENOUS
  Administered 2017-03-07 (×5): 80 ug via INTRAVENOUS

## 2017-03-07 SURGICAL SUPPLY — 46 items
BANDAGE ACE 4X5 VEL STRL LF (GAUZE/BANDAGES/DRESSINGS) IMPLANT
BANDAGE ACE 6X5 VEL STRL LF (GAUZE/BANDAGES/DRESSINGS) IMPLANT
BLADE SURG 11 STRL SS (BLADE) ×2 IMPLANT
BNDG GAUZE ELAST 4 BULKY (GAUZE/BANDAGES/DRESSINGS) IMPLANT
CANISTER SUCT 3000ML PPV (MISCELLANEOUS) ×2 IMPLANT
COVER DRAPE ULTRASOUND 610 023 (DRAPES) ×2 IMPLANT
COVER SURGICAL LIGHT HANDLE (MISCELLANEOUS) ×4 IMPLANT
DRAIN CHANNEL 15F RND FF W/TCR (WOUND CARE) ×2 IMPLANT
DRAPE HALF SHEET 40X57 (DRAPES) ×2 IMPLANT
DRAPE INCISE IOBAN 66X45 STRL (DRAPES) IMPLANT
DRAPE ORTHO SPLIT 77X108 STRL (DRAPES)
DRAPE SURG ORHT 6 SPLT 77X108 (DRAPES) IMPLANT
DRSG COVADERM 4X14 (GAUZE/BANDAGES/DRESSINGS) ×2 IMPLANT
ELECT REM PT RETURN 9FT ADLT (ELECTROSURGICAL) ×2
ELECTRODE REM PT RTRN 9FT ADLT (ELECTROSURGICAL) ×1 IMPLANT
EVACUATOR SILICONE 100CC (DRAIN) ×2 IMPLANT
FLUID NSS /IRRIG 3000 ML XXX (IV SOLUTION) ×2 IMPLANT
GAUZE SPONGE 4X4 12PLY STRL (GAUZE/BANDAGES/DRESSINGS) ×2 IMPLANT
GLOVE BIO SURGEON STRL SZ7 (GLOVE) ×2 IMPLANT
GLOVE BIOGEL PI IND STRL 7.0 (GLOVE) ×1 IMPLANT
GLOVE BIOGEL PI IND STRL 7.5 (GLOVE) ×1 IMPLANT
GLOVE BIOGEL PI INDICATOR 7.0 (GLOVE) ×1
GLOVE BIOGEL PI INDICATOR 7.5 (GLOVE) ×1
GLOVE ECLIPSE 6.5 STRL STRAW (GLOVE) ×2 IMPLANT
GOWN STRL REUS W/ TWL LRG LVL3 (GOWN DISPOSABLE) ×5 IMPLANT
GOWN STRL REUS W/TWL LRG LVL3 (GOWN DISPOSABLE) ×5
HANDPIECE INTERPULSE COAX TIP (DISPOSABLE) ×1
HEMOSTAT SPONGE AVITENE ULTRA (HEMOSTASIS) ×2 IMPLANT
KIT BASIN OR (CUSTOM PROCEDURE TRAY) ×2 IMPLANT
KIT ROOM TURNOVER OR (KITS) ×2 IMPLANT
NS IRRIG 1000ML POUR BTL (IV SOLUTION) ×2 IMPLANT
PACK GENERAL/GYN (CUSTOM PROCEDURE TRAY) IMPLANT
PAD ARMBOARD 7.5X6 YLW CONV (MISCELLANEOUS) ×4 IMPLANT
SET HNDPC FAN SPRY TIP SCT (DISPOSABLE) ×1 IMPLANT
STAPLER VISISTAT 35W (STAPLE) ×2 IMPLANT
SUT ETHILON 3 0 PS 1 (SUTURE) ×2 IMPLANT
SUT MNCRL AB 4-0 PS2 18 (SUTURE) ×2 IMPLANT
SUT VIC AB 2-0 CT1 27 (SUTURE) ×5
SUT VIC AB 2-0 CT1 TAPERPNT 27 (SUTURE) ×5 IMPLANT
SUT VIC AB 2-0 CTX 36 (SUTURE) ×2 IMPLANT
SUT VIC AB 3-0 SH 27 (SUTURE)
SUT VIC AB 3-0 SH 27X BRD (SUTURE) IMPLANT
SWAB COLLECTION DEVICE MRSA (MISCELLANEOUS) ×2 IMPLANT
SWAB CULTURE ESWAB REG 1ML (MISCELLANEOUS) ×2 IMPLANT
TAPE CLOTH SURG 4X10 WHT LF (GAUZE/BANDAGES/DRESSINGS) ×2 IMPLANT
WATER STERILE IRR 1000ML POUR (IV SOLUTION) ×2 IMPLANT

## 2017-03-07 NOTE — Progress Notes (Signed)
[facility] Progress Note  Subjective: pt post op , no c/o's  Vitals:   03/07/17 1039 03/07/17 1052 03/07/17 1107 03/07/17 1110  BP:  (!) 92/55 99/60   Pulse:  68 63   Resp:  12 19   Temp: 97.9 F (36.6 C)   97.8 F (36.6 C)  TempSrc:      SpO2:  (!) 88% 97%   Weight:      Height:        Inpatient medications: . calcitRIOL  1.25 mcg Oral Once per day on Tue Thu Sat  . calcium acetate  2,001 mg Oral TID WC  . cinacalcet  90 mg Oral Q T,Th,Sat-1800  . enoxaparin (LOVENOX) injection  30 mg Subcutaneous Daily  . feeding supplement (NEPRO CARB STEADY)  237 mL Oral BID BM  . feeding supplement (PRO-STAT SUGAR FREE 64)  30 mL Oral BID  . gabapentin  100 mg Oral BID  . midodrine  10 mg Oral TID WC  . senna  1 tablet Oral BID  . sevelamer carbonate  3,200 mg Oral TID WC  . sodium chloride flush  3 mL Intravenous Q12H   . sodium chloride 250 mL (03/06/17 0238)  . vancomycin 750 mg (03/06/17 2130)   sodium chloride, acetaminophen **OR** acetaminophen, ondansetron **OR** ondansetron (ZOFRAN) IV, oxyCODONE-acetaminophen, polyethylene glycol, sodium chloride flush, traMADol  Exam: No distress No jvd Chest clear bilat RRR no mrg Abd soft ntnd  L groin wound w staples and drain Dark L foot skin changes c/w gangrene NF, ox 3  Dialysis: [facility] 3h  72kg  2/2 bath  AVF  Hep 1400 - Calcitriol 1.81mcg PO q HD - Venofer 100mg  x 10 ordered (4 given so far) for tsat 20% on 7/26. - Was getting sensipar 180mg  PO q HD (held on 7/28 d/t low Ca)      Impression: 1. LLE and R foot cellulitis/dry gangrene L toes: On empiric abx. BCx 8/6 pending. VVS following, unclear plan for any surgical intervention. In the past, he had adamantly refused toe amputations. 2.  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  ESRD: Continue TTS HD  4.  BP/volume: Chronic hypotension, on midodrine. No edema, BP's low 5.  Anemia: Hgb 11.9. No ESA for now. IV iron  not continued inpatient d/t infection. 6.  Metabolic bone disease: Ca ok, Phos slightly high. Continue Phoslo/Renvela as binders, and reduce Sensipar 180 ->90mg  PO TTS. Restart PO calcitriol. 7.  Nutrition: Alb low, continue Nepro. 8.  HFrEF (EF 15-20% with AICD): asymptomatic, per primary.  Plan - HD tomorrow, min UF   Vinson Moselle MD [facility] pager [phone]   03/07/2017, 1:24 PM    Recent Labs Lab 03/05/17 1741 03/06/17 0408 03/07/17 0636  NA 137 140 136  K 4.2 4.2 3.8  CL 95* 94* 92*  CO2 26 29 30   GLUCOSE 111* 113* 90  BUN 35* 36* 28*  CREATININE 8.00* 8.48* 6.30*  CALCIUM 8.8* 8.6* 8.2*  PHOS  --  5.9*  --     Recent Labs Lab 03/05/17 1741 03/06/17 0408  AST 20 16  ALT 9* 8*  ALKPHOS 150* 133*  BILITOT 0.9 0.8  PROT 6.9 6.3*  ALBUMIN 2.8* 2.5*    Recent Labs Lab 03/05/17 1741 03/06/17 0408 03/07/17 0636  WBC 24.3* 20.8* 18.9*  NEUTROABS 22.3*  --   --   HGB 12.6* 11.9* 11.9*  HCT 40.2 37.9* 38.8*  MCV  87.2 86.1 87.6  PLT 243 221 260   Iron/TIBC/Ferritin/ %Sat No results found for: IRON, TIBC, FERRITIN, IRONPCTSAT

## 2017-03-07 NOTE — H&P (View-Only) (Signed)
Patient name: Willie Andrade MRN: [id number] DOB: [birth year] Sex: male    HPI: Willie Andrade is a 66 y.o. male patient well known to me from prior left femoral to peroneal in situ bypass for attempted limb salvage. Had presented with dry gangrene to the toes of his left foot and some excoriation in his right foot. His bypass was on 01/24/2017. He had been followed in our office. He does have difficulty with chronic pain. He has had aspiration of lymphocele in his left groin on 2 occasions. Discussed this in the office that this would have a high likelihood of recurrence. This has recurred. He is now admitted for multiple additional medical problems including his renal failure  Current Facility-Administered Medications  Medication Dose Route Frequency Provider Last Rate Last Dose  . 0.9 %  sodium chloride infusion  250 mL Intravenous PRN Therisa Doyne, MD 10 mL/hr at 03/06/17 0238 250 mL at 03/06/17 0238  . acetaminophen (TYLENOL) tablet 650 mg  650 mg Oral Q6H PRN Therisa Doyne, MD       Or  . acetaminophen (TYLENOL) suppository 650 mg  650 mg Rectal Q6H PRN Doutova, Anastassia, MD      . calcium acetate (PHOSLO) capsule 2,001 mg  2,001 mg Oral TID WC Doutova, Anastassia, MD   2,001 mg at 03/06/17 0850  . cinacalcet (SENSIPAR) tablet 180 mg  180 mg Oral Q T,Th,Sat-1800 Doutova, Anastassia, MD      . enoxaparin (LOVENOX) injection 30 mg  30 mg Subcutaneous Daily Doutova, Anastassia, MD   30 mg at 03/06/17 0951  . feeding supplement (NEPRO CARB STEADY) liquid 237 mL  237 mL Oral BID BM Doutova, Anastassia, MD      . gabapentin (NEURONTIN) capsule 100 mg  100 mg Oral BID Doutova, Anastassia, MD   100 mg at 03/06/17 0951  . midodrine (PROAMATINE) tablet 10 mg  10 mg Oral TID WC Doutova, Anastassia, MD   10 mg at 03/06/17 0850  . ondansetron (ZOFRAN) tablet 4 mg  4 mg Oral Q6H PRN Doutova, Anastassia, MD       Or  . ondansetron (ZOFRAN) injection 4  mg  4 mg Intravenous Q6H PRN Doutova, Anastassia, MD      . oxyCODONE-acetaminophen (PERCOCET/ROXICET) 5-325 MG per tablet 1-2 tablet  1-2 tablet Oral Q6H PRN Therisa Doyne, MD   2 tablet at 03/06/17 0951  . polyethylene glycol (MIRALAX / GLYCOLAX) packet 17 g  17 g Oral Daily PRN Doutova, Anastassia, MD      . senna (SENOKOT) tablet 8.6 mg  1 tablet Oral BID Therisa Doyne, MD   8.6 mg at 03/06/17 0951  . sevelamer carbonate (RENVELA) tablet 3,200 mg  3,200 mg Oral TID WC Doutova, Anastassia, MD   3,200 mg at 03/06/17 0849  . sodium chloride flush (NS) 0.9 % injection 3 mL  3 mL Intravenous Q12H Doutova, Anastassia, MD   3 mL at 03/06/17 0951  . sodium chloride flush (NS) 0.9 % injection 3 mL  3 mL Intravenous PRN Doutova, Anastassia, MD      . traMADol (ULTRAM) tablet 50 mg  50 mg Oral Q6H PRN Doutova, Anastassia, MD      . vancomycin (VANCOCIN) IVPB 750 mg/150 ml premix  750 mg Intravenous Q T,Th,Sa-HD Therisa Doyne, MD         PHYSICAL EXAM: Vitals:   03/06/17 0030 03/06/17 0100 03/06/17 0204 03/06/17 0603  BP: 104/67  100/63 103/60  Pulse: 91 95 95 96  Resp:  18 17  Temp:   98.1 F (36.7 C) 98.3 F (36.8 C)  TempSrc:   Oral Oral  SpO2: 99%     Weight:   160 lb 4.8 oz (72.7 kg)   Height:   6\' 2"  (1.88 m)     GENERAL: The patient is a well-nourished male, in no acute distress. The vital signs are documented above. On physical exam he has had recurrence of his left groin lymphocele. This is tender although he reports tenderness pretty much anywhere he is touched on his body. There is no evidence of erythema. His subcuticular film peroneal graft pulses easily palpable. He continues to have dry gangrenous changes on the toes of his foot with some swelling. He does have some opening of the distal medial vein harvest incision above the level of his tibioperoneal bypass with response to local wound care  MEDICAL ISSUES: Recurrent the lymphocele now with some tenderness.  I recommended evacuation of this in the operating room tomorrow. He is to have hemodialysis today. We'll coordinate this for tomorrow. I'm in the office or one of my partners will be doing the procedure. Explained that there is some likelihood of recurrence even with debridement of the lymphocele and drain placement.   [name], MD FACS [facility] Tel [phone] Pager [phone]

## 2017-03-07 NOTE — Anesthesia Procedure Notes (Signed)
Procedure Name: LMA Insertion Date/Time: 03/07/2017 8:50 AM Performed by: Jed Limerick Pre-anesthesia Checklist: Patient identified, Emergency Drugs available, Suction available and Patient being monitored Patient Re-evaluated:Patient Re-evaluated prior to induction Oxygen Delivery Method: Circle System Utilized Preoxygenation: Pre-oxygenation with 100% oxygen Induction Type: IV induction Ventilation: Mask ventilation without difficulty LMA: LMA inserted LMA Size: 5.0 Number of attempts: 1 Airway Equipment and Method: Bite block Placement Confirmation: positive ETCO2 Tube secured with: Tape Dental Injury: Teeth and Oropharynx as per pre-operative assessment

## 2017-03-07 NOTE — Progress Notes (Signed)
PROGRESS NOTE    Willie Andrade  ZOX:[id number] DOB: Oct 20, 1950 DOA: 03/05/2017 PCP: [name], MD  Outpatient Specialists:     Brief Narrative:  66 y/o ? 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 Drain in place. Management per vascular surgery.  Sepsis/left lower extremity and right foot cellulitis/dry gangrene of left toes   - On empiric antibiotics/IV vancomycin and Ancef. Patient has declined 2 amputations in the past.    LLE fem-pop bypass 6/25  Still necrotic changes as well as open wound at distal medial harvest area--Appreciate woc nurse input  Chr full thickness wound to be dressed with moist gz + foam dressing  No Current concern for osteomyelitis on x-rays--patient will need close monitoring  End-stage renal disease Tuesday Thursday Saturday HD. Nephrology follow-up appreciated.  Chronic hypotension associated with dialysis--continue Midodrine 10 mg 3 times a day  Anemia of renal disease  Secondary hypoparathyroidism  Appreciate nephrology seeing patient in consult  Hemoglobin 11.9-defer use of EPO,  Phosphorous 5.9, calcium  8.6, may need to use a lower dose of binder?   Continue calcitriol 1.25 3 times a day  Patient will be noncompliant with renal diet-he is asking for crab steak and shrimp   I have explained to him clearly that I'm not sure we can obtain these settings within the hospital setting  Chronic systolic heart failure Cardiomyopathy EF 15% 12/23/16 PSP 50 mmHg  Volume management as per dialysis  Chronic pain  Etiology unclear  Minimize tramadol and Percocet going forward   DVT prophylaxis: Lovenox CODE STATUS: Full Family communication: None at bedside. Disposition: DC home when stable.  Consultants:   nephro  vasc  Procedures:   Excision of lymphocele 8/7.  Antimicrobials:   Vancomycin  Ancef    Subjective:  Seen this afternoon after surgery. Reports postop pain in left groin.  Objective: Vitals:   03/07/17 1039 03/07/17 1052 03/07/17 1107 03/07/17 1110  BP:  (!) 92/55 99/60   Pulse:  68 63   Resp:  12 19   Temp: 97.9 F (36.6 C)   97.8 F (36.6 C)  TempSrc:      SpO2:  (!) 88% 97%   Weight:      Height:        Intake/Output Summary (Last 24 hours) at 03/07/17 1931 Last data filed at 03/07/17 1412  Gross per 24 hour  Intake              420 ml  Output             2055 ml  Net            -  1635 ml   Filed Weights   03/06/17 0204 03/06/17 1850 03/06/17 2316  Weight: 72.7 kg (160 lb 4.8 oz) 71.6 kg (157 lb 13.6 oz) 71.6 kg (157 lb 13.6 oz)    Examination:  Gen. exam: Pleasant middle-aged male lying comfortably propped up in bed. Irritable.  Respiratory system: Clear to auscultation. No increased work of breathing. Cardiovascular system: S1 and S2 heard. RRR. No JVD, murmurs. 1+ pitting bilateral leg edema. Abdomen: Nondistended, soft and nontender. Left groin surgical site staples intact without acute findings. Drain in place. CNS: Alert and oriented. No focal neurological deficits. Extremities: Bilateral foot darkening with bandages and wound supplies  between right second and third toes Feet are swollen with lower extremity edema and are painful to touch     Data Reviewed: I have personally reviewed following labs and imaging studies  CBC:  Recent Labs Lab 03/05/17 1741 03/06/17 0408 03/07/17 0636  WBC 24.3* 20.8* 18.9*  NEUTROABS 22.3*  --   --   HGB 12.6* 11.9* 11.9*  HCT 40.2 37.9* 38.8*  MCV 87.2 86.1 87.6  PLT 243 221 260   Basic Metabolic Panel:  Recent Labs Lab 03/05/17 1741 03/06/17 0408 03/07/17 0636  NA 137 140 136  K 4.2 4.2 3.8  CL 95* 94* 92*  CO2 26 29 30   GLUCOSE 111* 113* 90  BUN 35* 36* 28*  CREATININE 8.00* 8.48* 6.30*  CALCIUM 8.8* 8.6* 8.2*  MG  --  1.9  --   PHOS  --  5.9*  --    GFR: Estimated Creatinine Clearance: 11.7 mL/min (A) (by C-G formula based on SCr of 6.3 mg/dL (H)). Liver Function Tests:  Recent Labs Lab 03/05/17 1741 03/06/17 0408  AST 20 16  ALT 9* 8*  ALKPHOS 150* 133*  BILITOT 0.9 0.8  PROT 6.9 6.3*  ALBUMIN 2.8* 2.5*   No results for input(s): LIPASE, AMYLASE in the last 168 hours. No results for input(s): AMMONIA in the last 168 hours. Coagulation Profile:  Recent Labs Lab 03/05/17 1741 03/07/17 0636  INR 1.36 1.29   Cardiac Enzymes: No results for input(s): CKTOTAL, CKMB, CKMBINDEX, TROPONINI in the last 168 hours. BNP (last 3 results) No results for input(s): PROBNP in the last 8760 hours. HbA1C: No results for input(s): HGBA1C in the last 72 hours. CBG: No results for input(s): GLUCAP in the last 168 hours. Lipid Profile: No results for input(s): CHOL, HDL, LDLCALC, TRIG, CHOLHDL, LDLDIRECT in the last 72 hours. Thyroid Function Tests:  Recent Labs  03/06/17 0408  TSH 1.733   Anemia Panel: No results for input(s): VITAMINB12, FOLATE, FERRITIN, TIBC, IRON, RETICCTPCT in the last 72 hours. Urine analysis: No results found for: COLORURINE, APPEARANCEUR, LABSPEC, PHURINE, GLUCOSEU, HGBUR, BILIRUBINUR, KETONESUR, PROTEINUR, UROBILINOGEN,  NITRITE, LEUKOCYTESUR Sepsis Labs: @LABRCNTIP (procalcitonin:4,lacticidven:4)  ) Recent Results (from the past 240 hour(s))  Culture, blood (Routine x 2)     Status: None (Preliminary result)   Collection Time: 03/05/17  5:40 PM  Result Value Ref Range Status   Specimen Description BLOOD RIGHT HAND  Final   Special Requests IN PEDIATRIC BOTTLE Blood Culture adequate volume  Final   Culture NO GROWTH 2 DAYS  Final   Report Status PENDING  Incomplete  Culture, blood (Routine x 2)     Status: None (Preliminary result)   Collection Time: 03/05/17  7:29 PM  Result Value Ref Range Status   Specimen Description BLOOD BLOOD RIGHT FOREARM  Final   Special Requests IN PEDIATRIC BOTTLE Blood Culture  adequate volume  Final   Culture NO GROWTH 2 DAYS  Final   Report Status PENDING  Incomplete  MRSA PCR Screening     Status: None   Collection Time: 03/06/17  2:18 AM  Result Value Ref Range Status   MRSA by PCR NEGATIVE NEGATIVE Final    Comment:        The GeneXpert MRSA Assay (FDA approved for NASAL specimens only), is one component of a comprehensive MRSA colonization surveillance program. It is not intended to diagnose MRSA infection nor to guide or monitor treatment for MRSA infections.   Surgical pcr screen     Status: None   Collection Time: 03/07/17  5:59 AM  Result Value Ref Range Status   MRSA, PCR NEGATIVE NEGATIVE Final   Staphylococcus aureus NEGATIVE NEGATIVE Final    Comment:        The Xpert SA Assay (FDA approved for NASAL specimens in patients over 33 years of age), is one component of a comprehensive surveillance program.  Test performance has been validated by [facility] for patients greater than or equal to 41 year old. It is not intended to diagnose infection nor to guide or monitor treatment.   Aerobic/Anaerobic Culture (surgical/deep wound)     Status: None (Preliminary result)   Collection Time: 03/07/17  9:15 AM  Result Value Ref Range Status    Specimen Description GROIN  Final   Special Requests LEFT  Final   Gram Stain   Final    FEW WBC PRESENT,BOTH PMN AND MONONUCLEAR NO ORGANISMS SEEN    Culture PENDING  Incomplete   Report Status PENDING  Incomplete         Radiology Studies: Dg Ankle Complete Left  Result Date: 03/05/2017 CLINICAL DATA:  Possible infection.  Soft tissue swelling. EXAM: LEFT ANKLE COMPLETE - 3+ VIEW COMPARISON:  None. FINDINGS: The bones are demineralized. There is no acute fracture dislocations involving the left ankle. Dorsal calcaneal enthesophyte is noted. The ankle and subtalar joints are maintained. Vascular calcifications are noted crossing the ankle joint which can be seen with diabetes. Diffuse soft tissue swelling and thickening is noted which may reflect chronic venous insufficiency, third spacing of fluid or possibly stigmata of a cellulitis. No bone destruction is noted. IMPRESSION: 1. Nonspecific diffuse soft tissue swelling of the included left leg and ankle possibly representing chronic venous insufficiency, third spacing of fluid or possibly cellulitis. 2. No evidence of osteomyelitis. 3. Osteopenia. Electronically Signed   By: Tollie Eth M.D.   On: 03/05/2017 20:13   Dg Foot Complete Left  Result Date: 03/05/2017 CLINICAL DATA:  Possible infection EXAM: LEFT FOOT - COMPLETE 3+ VIEW COMPARISON:  None. FINDINGS: Diffuse soft tissue swelling is noted, nonspecific but possibly representing stigmata of cellulitis, third spacing of fluid or possibly from chronic venous insufficiency. Bones are demineralized in appearance without frank bone destruction. Mild degenerative joint space narrowing is seen of the toes. No acute fracture joint dislocations. Dorsal calcaneal enthesophyte is seen. Ankle mortise and subtalar articulations are maintained as are the midfoot joints. IMPRESSION: 1. Osteopenic appearance of the left foot without fracture or frank bone destruction. 2. Soft tissue swelling of the  included leg, ankle and foot. Electronically Signed   By: Tollie Eth M.D.   On: 03/05/2017 20:16   Dg Foot Complete Right  Result Date: 03/05/2017 CLINICAL DATA:  Possible infection. EXAM: RIGHT FOOT COMPLETE - 3+ VIEW COMPARISON:  None. FINDINGS: Diffuse soft tissue swelling of the included leg,  ankle and foot especially over the dorsum of the forefoot. No underlying bone destruction or fracture. No joint dislocations. Bones are demineralized in appearance. Dorsal calcaneal enthesophyte is seen. IMPRESSION: 1. Nonspecific soft tissue swelling of the included leg, ankle and foot. Findings could be due to third spacing of fluid, cellulitis or possibly chronic venous insufficiency. 2. No underlying fracture. 3. No evidence of osteomyelitis. Electronically Signed   By: Tollie Eth M.D.   On: 03/05/2017 20:19        Scheduled Meds: . calcitRIOL  1.25 mcg Oral Once per day on Tue Thu Sat  . calcium acetate  2,001 mg Oral TID WC  . cinacalcet  90 mg Oral Q T,Th,Sat-1800  . enoxaparin (LOVENOX) injection  30 mg Subcutaneous Daily  . feeding supplement (NEPRO CARB STEADY)  237 mL Oral BID BM  . feeding supplement (PRO-STAT SUGAR FREE 64)  30 mL Oral BID  . gabapentin  100 mg Oral BID  . [START ON 03/08/2017] heparin  1,000 Units Dialysis Once in dialysis  . midodrine  10 mg Oral TID WC  . senna  1 tablet Oral BID  . sevelamer carbonate  3,200 mg Oral TID WC  . sodium chloride flush  3 mL Intravenous Q12H   Continuous Infusions: . sodium chloride 250 mL (03/06/17 0238)  . sodium chloride    . sodium chloride    . vancomycin 750 mg (03/06/17 2130)     LOS: 1 day    Time spent: 30    Britiany Silbernagel, MD, FACP, FHM. [facility] Pager [phone]  If 7PM-7AM, please contact night-coverage [email or web address] Password [facility] 03/07/2017, 7:38 PM

## 2017-03-07 NOTE — Interval H&P Note (Signed)
   History and Physical Update  The patient was interviewed and re-examined.  The patient's previous History and Physical has been reviewed and is unchanged from Dr. Bosie Helper consult.  There is no change in the plan of care: drainage and likely excision of left groin lymphocele, possible drain placement.  Risks and benefits were discussed with patient by Dr. Arbie Cookey.  Risks include but are not limited to bleeding, infection, nerve damage, recurrence of lymphocele, and need for additional procedures.  The patient elects to proceed with the procedure.   Leonides Sake, MD, FACS [facility] [place] Office: [phone] Pager: [phone]  03/07/2017, 8:08 AM

## 2017-03-07 NOTE — Op Note (Addendum)
OPERATIVE NOTE   PROCEDURE: 1. Excision of left groin lymphocele 2. Excision of left groin lymph nodes  PRE-OPERATIVE DIAGNOSIS: recurrent left groin lymphocele  POST-OPERATIVE DIAGNOSIS: same as above   SURGEON: Leonides Sake, MD  ASSISTANT(S): Karsten Ro, PAC   ANESTHESIA: general  ESTIMATED BLOOD LOSS: 50 cc  FINDING(S): 1.  Large lymphocele extending down to proximal greater saphenous vein harvest incision 2.  Small cavity around greater saphenous vein, extending into mid-thigh with some turbid fluid  SPECIMEN(S):  Anaerobic and aerobic cultures of serous fluid  INDICATIONS:   Willie Andrade is a 66 y.o. male who presents with recurrent left groin lymph accumulation after left femoroperoneal bypass by Dr. Arbie Cookey.  Dr. Arbie Cookey recommended: evacuation of lymphocele and drain placement.   Risks and benefits were discussed with patient.  Risks include but are not limited to bleeding, infection, nerve damage, recurrence of lymphocele, and need for additional procedures.  The patient elects to proceed with the procedure.   DESCRIPTION: After obtaining full informed written consent, the patient was brought back to the operating room and placed supine upon the operating table.  The patient received IV antibiotics prior to induction.  A procedure time out was completed and the correct surgical site was verified.  After obtaining adequate anesthesia, the patient was prepped and draped in the standard fashion for: left groin and thigh exploration.  I made an incision at the prior left groin scar.  I dissected into the lymphocele cavity with electrocautery.  I sucked out 100 cc of serous fluid that was initially clear.  I opened the skin on top of the cavity as it extended another 10 cm beyond the initial incision.   I explored this lymphocele cavity and found three pockets with some mildly turbid fluid.  Anaerobic and aerobic cultures of this fluid were obtained.  There was an organized wall  in this lymphocele, subsequently I felt excision of this large lymphocele was necessary to get resolution.  I excised the majority of the lymphocele cavity with electrocautery.  In the process, I visualized multiple small lymphatic vessels feeding the cavity.  These were controlled with electrocautery.  After I had finished excising this lymphocele, a defect in the subcutaneous tissue revealed the proximal greater saphenous vein.  The actual femoral anastomosis, however, was well scarred in and not evident.  There appeared to be a cavity surrounding the greater saphenous vein proximally that extended into the mid-thigh.  There was some turbid fluid in this pocket.  I elected to wash out this entire surgical incision with 3 L of normal saline using a Pulsavac.  I controlled a few bleeding points with electrocautery.    I noticed some large lymph nodes that would interfere with closure of the deep tissue, so I resected these lymph nodes with electrocautery.  I reapproximated the deep tissue with horizontal mattresses stitches using 2-0 Vicryl.  I also covered the greater saphenous vein with this maneuver, leaving a small defect to route a 15 Blake drain into the cavity surrounding the vein.   At this point, I made a stab incision in the lateral thigh adjacent to the incision.  I routed a 15 Blake drain through this defect and cut the drain to appropriate length.  I routed the drain over the deep tissue down into the greater saphenous vein cavity.  I then reapproximated the subcutaneous tissue with a running stitch of 3-0 Vicryl.  There appeared to be redundant skin, so I resected this skin  on both sides of the incision.  I reapproximated the superficial subcutaneous tissue with a running stitch of 3-0 Vicryl.  The skin was reapproximated with staples.  The Blake drain was connected to bulb suction and the bulb was charged, immediately draining some serosanguinous fluid.  The staple line was covered with a  Coverall dressing and the drain exit site dressed with sterile dressing and tape.   COMPLICATIONS: none  CONDITION: stable   Leonides Sake, MD, [facility] [place] Office: [phone] Pager: [phone]  03/07/2017, 10:30 AM

## 2017-03-07 NOTE — Transfer of Care (Signed)
Immediate Anesthesia Transfer of Care Note  Patient: Willie Andrade  Procedure(s) Performed: Procedure(s): EXCISION AND CLOSURE OF LEFT GROIN LYMPHOCELE (Left)  Patient Location: PACU  Anesthesia Type:General  Level of Consciousness: awake, alert  and oriented  Airway & Oxygen Therapy: Patient Spontanous Breathing and Patient connected to face mask oxygen  Post-op Assessment: Report given to RN and Post -op Vital signs reviewed and stable  Post vital signs: Reviewed and stable  Last Vitals:  Vitals:   03/06/17 2316 03/07/17 1039  BP: (!) 99/55   Pulse: 61   Resp: 18   Temp: 36.9 C (P) 36.6 C    Last Pain:  Vitals:   03/06/17 2316  TempSrc: Oral  PainSc:       Patients Stated Pain Goal: 2 (03/06/17 0950)  Complications: No apparent anesthesia complications

## 2017-03-07 NOTE — Anesthesia Preprocedure Evaluation (Signed)
Anesthesia Evaluation    Airway Mallampati: III       Dental  (+) Edentulous Upper, Edentulous Lower   Pulmonary Current Smoker,    Pulmonary exam normal breath sounds clear to auscultation       Cardiovascular hypertension, + Peripheral Vascular Disease and +CHF  Normal cardiovascular exam+ pacemaker  Rhythm:Regular Rate:Normal     Neuro/Psych negative neurological ROS  negative psych ROS   GI/Hepatic negative GI ROS, (+) Hepatitis -, B  Endo/Other    Renal/GU ESRF and DialysisRenal disease  negative genitourinary   Musculoskeletal negative musculoskeletal ROS (+)   Abdominal   Peds  Hematology negative hematology ROS (+)   Anesthesia Other Findings   Reproductive/Obstetrics                             Anesthesia Physical Anesthesia Plan  ASA: IV  Anesthesia Plan: General   Post-op Pain Management:    Induction: Intravenous  PONV Risk Score and Plan: 1 and Ondansetron, Promethazine, Propofol infusion and Treatment may vary due to age or medical condition  Airway Management Planned: LMA  Additional Equipment:   Intra-op Plan:   Post-operative Plan: Extubation in OR  Informed Consent: I have reviewed the patients History and Physical, chart, labs and discussed the procedure including the risks, benefits and alternatives for the proposed anesthesia with the patient or authorized representative who has indicated his/her understanding and acceptance.   Dental advisory given  Plan Discussed with: CRNA, Anesthesiologist and Surgeon  Anesthesia Plan Comments:         Anesthesia Quick Evaluation

## 2017-03-07 NOTE — Anesthesia Postprocedure Evaluation (Signed)
Anesthesia Post Note  Patient: Willie Andrade  Procedure(s) Performed: Procedure(s) (LRB): EXCISION AND CLOSURE OF LEFT GROIN LYMPHOCELE (Left)     Patient location during evaluation: PACU Anesthesia Type: General Level of consciousness: awake and alert and oriented Pain management: pain level controlled Vital Signs Assessment: post-procedure vital signs reviewed and stable Respiratory status: spontaneous breathing, nonlabored ventilation and respiratory function stable Cardiovascular status: blood pressure returned to baseline and stable Postop Assessment: no signs of nausea or vomiting Anesthetic complications: no    Last Vitals:  Vitals:   03/07/17 1107 03/07/17 1110  BP: 99/60   Pulse: 63   Resp: 19   Temp:  36.6 C    Last Pain:  Vitals:   03/07/17 1110  TempSrc:   PainSc: 0-No pain                 Joel Mericle A.

## 2017-03-08 ENCOUNTER — Encounter (HOSPITAL_COMMUNITY): Payer: Self-pay | Admitting: Vascular Surgery

## 2017-03-08 LAB — CBC
HEMATOCRIT: 35.7 % — AB (ref 39.0–52.0)
Hemoglobin: 11.2 g/dL — ABNORMAL LOW (ref 13.0–17.0)
MCH: 26.7 pg (ref 26.0–34.0)
MCHC: 31.4 g/dL (ref 30.0–36.0)
MCV: 85.2 fL (ref 78.0–100.0)
Platelets: 304 10*3/uL (ref 150–400)
RBC: 4.19 MIL/uL — ABNORMAL LOW (ref 4.22–5.81)
RDW: 19.2 % — AB (ref 11.5–15.5)
WBC: 14.2 10*3/uL — AB (ref 4.0–10.5)

## 2017-03-08 LAB — RENAL FUNCTION PANEL
ALBUMIN: 2.3 g/dL — AB (ref 3.5–5.0)
Anion gap: 14 (ref 5–15)
BUN: 47 mg/dL — AB (ref 6–20)
CALCIUM: 7.9 mg/dL — AB (ref 8.9–10.3)
CO2: 25 mmol/L (ref 22–32)
CREATININE: 8.15 mg/dL — AB (ref 0.61–1.24)
Chloride: 96 mmol/L — ABNORMAL LOW (ref 101–111)
GFR calc Af Amer: 7 mL/min — ABNORMAL LOW (ref >60)
GFR calc non Af Amer: 6 mL/min — ABNORMAL LOW (ref >60)
Glucose, Bld: 96 mg/dL (ref 65–99)
PHOSPHORUS: 4.2 mg/dL (ref 2.5–4.6)
POTASSIUM: 4.1 mmol/L (ref 3.5–5.1)
SODIUM: 135 mmol/L (ref 135–145)

## 2017-03-08 MED ORDER — VANCOMYCIN HCL IN DEXTROSE 750-5 MG/150ML-% IV SOLN
INTRAVENOUS | Status: AC
  Administered 2017-03-08: 750 mg via INTRAVENOUS
  Filled 2017-03-08: qty 150

## 2017-03-08 MED ORDER — OXYCODONE-ACETAMINOPHEN 5-325 MG PO TABS
ORAL_TABLET | ORAL | Status: AC
  Administered 2017-03-08: 2 via ORAL
  Filled 2017-03-08: qty 2

## 2017-03-08 MED ORDER — OXYCODONE-ACETAMINOPHEN 5-325 MG PO TABS
ORAL_TABLET | ORAL | Status: AC
  Filled 2017-03-08: qty 2

## 2017-03-08 MED ORDER — MIDODRINE HCL 5 MG PO TABS
ORAL_TABLET | ORAL | Status: AC
  Administered 2017-03-08: 10 mg via ORAL
  Filled 2017-03-08: qty 2

## 2017-03-08 NOTE — Progress Notes (Signed)
PROGRESS NOTE    Willie Andrade  ZOX:[id number] DOB: 03/22/1951 DOA: 03/05/2017 PCP: [name], MD  Outpatient Specialists:     Brief Narrative:  66 y/o ? 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 Drain in place. Management per vascular surgery >continuing JP drain due to high output.  Sepsis/left lower extremity and right foot cellulitis/dry gangrene of left toes   - On empiric antibiotics/IV vancomycin and Ancef. Patient has declined 2 amputations in the past.   - As per vascular surgery follow-up, continue daily dressing changes to dehisced distal left leg wound. Left leg bypass is patent. Stable dry gangrenous changes to left toes. No plan for any type of amputation at this time unless things worsen.  - Blood cultures 2: Negative to date.    LLE fem-pop bypass 6/25  Still necrotic changes as well as open wound at distal medial harvest area--Appreciate woc nurse input  Chr full thickness wound to be dressed with moist gz + foam dressing  No Current concern for osteomyelitis on x-rays--patient will need close monitoring  - Please see  above.  End-stage renal disease Tuesday Thursday Saturday HD. Nephrology follow-up appreciated.  Chronic hypotension associated with dialysis--continue Midodrine 10 mg 3 times a day. Stable.  Anemia of renal disease. Stable.  Secondary hypoparathyroidism  Management per nephrology.  Chronic systolic heart failure Cardiomyopathy EF 15% 12/23/16/AICD. Does not look overtly volume overloaded. Volume management across HD.  Chronic pain  Etiology unclear  Minimize tramadol and Percocet going forward.   DVT prophylaxis: Lovenox CODE STATUS: Full Family communication: None at bedside. Disposition: DC home when stable.  Consultants:   nephro  vasc  Procedures:   Excision of lymphocele 8/7.  HD  Antimicrobials:   Vancomycin  Ancef    Subjective:  Wants outside food. Reports left groin surgical site pain is better today. Wants to go back to sleep. Less irritable this morning.  Objective: Vitals:   03/07/17 2008 03/08/17 0013 03/08/17 0419 03/08/17 0900  BP: 90/67 104/63 (!) 90/55 (!) 107/51  Pulse: 79 (!) 105 72 79  Resp: 18 18 18 18   Temp: 97.8 F (36.6 C) 98.4 F (36.9 C) 97.9 F (36.6 C) 98.2 F (36.8 C)  TempSrc: Oral Oral Oral Oral  SpO2: 98% 94% 95% 98%  Weight: 70.8 kg (156 lb)     Height:        Intake/Output Summary (Last 24 hours) at 03/08/17 1657 Last data filed at 03/08/17 1300  Gross per 24 hour  Intake             1182 ml  Output  125 ml  Net             1057 ml   Filed Weights   03/06/17 1850 03/06/17 2316 03/07/17 2008  Weight: 71.6 kg (157 lb 13.6 oz) 71.6 kg (157 lb 13.6 oz) 70.8 kg (156 lb)    Examination:  Gen. exam: Pleasant middle-aged male lying comfortably propped up in bed. Interviewed and examined with his RN in room. Respiratory system: Clear to auscultation. No increased work of breathing. Stable. Cardiovascular system: S1 and S2 heard. RRR. No JVD, murmurs. 1+ pitting bilateral leg edema. Stable. Abdomen:  Nondistended, soft and nontender. Left groin surgical site staples intact without acute findings. Drain in place. CNS: Alert and oriented. No focal neurological deficits. Extremities: Dry gangrene changes of left foot. Dressing over left shin clean and dry. There is discoloration of right foot as well.     Data Reviewed: I have personally reviewed following labs and imaging studies  CBC:  Recent Labs Lab 03/05/17 1741 03/06/17 0408 03/07/17 0636  WBC 24.3* 20.8* 18.9*  NEUTROABS 22.3*  --   --   HGB 12.6* 11.9* 11.9*  HCT 40.2 37.9* 38.8*  MCV 87.2 86.1 87.6  PLT 243 221 260   Basic Metabolic Panel:  Recent Labs Lab 03/05/17 1741 03/06/17 0408 03/07/17 0636  NA 137 140 136  K 4.2 4.2 3.8  CL 95* 94* 92*  CO2 26 29 30   GLUCOSE 111* 113* 90  BUN 35* 36* 28*  CREATININE 8.00* 8.48* 6.30*  CALCIUM 8.8* 8.6* 8.2*  MG  --  1.9  --   PHOS  --  5.9*  --    GFR: Estimated Creatinine Clearance: 11.6 mL/min (A) (by C-G formula based on SCr of 6.3 mg/dL (H)). Liver Function Tests:  Recent Labs Lab 03/05/17 1741 03/06/17 0408  AST 20 16  ALT 9* 8*  ALKPHOS 150* 133*  BILITOT 0.9 0.8  PROT 6.9 6.3*  ALBUMIN 2.8* 2.5*   No results for input(s): LIPASE, AMYLASE in the last 168 hours. No results for input(s): AMMONIA in the last 168 hours. Coagulation Profile:  Recent Labs Lab 03/05/17 1741 03/07/17 0636  INR 1.36 1.29   Cardiac Enzymes: No results for input(s): CKTOTAL, CKMB, CKMBINDEX, TROPONINI in the last 168 hours. BNP (last 3 results) No results for input(s): PROBNP in the last 8760 hours. HbA1C: No results for input(s): HGBA1C in the last 72 hours. CBG: No results for input(s): GLUCAP in the last 168 hours. Lipid Profile: No results for input(s): CHOL, HDL, LDLCALC, TRIG, CHOLHDL, LDLDIRECT in the last 72 hours. Thyroid Function Tests:  Recent Labs  03/06/17 0408  TSH 1.733   Anemia Panel: No results for input(s): VITAMINB12, FOLATE, FERRITIN,  TIBC, IRON, RETICCTPCT in the last 72 hours. Urine analysis: No results found for: COLORURINE, APPEARANCEUR, LABSPEC, PHURINE, GLUCOSEU, HGBUR, BILIRUBINUR, KETONESUR, PROTEINUR, UROBILINOGEN, NITRITE, LEUKOCYTESUR Sepsis Labs: @LABRCNTIP (procalcitonin:4,lacticidven:4)  ) Recent Results (from the past 240 hour(s))  Culture, blood (Routine x 2)     Status: None (Preliminary result)   Collection Time: 03/05/17  5:40 PM  Result Value Ref Range Status   Specimen Description BLOOD RIGHT HAND  Final   Special Requests IN PEDIATRIC BOTTLE Blood Culture adequate volume  Final   Culture NO GROWTH 3 DAYS  Final   Report Status PENDING  Incomplete  Culture, blood (Routine x 2)     Status: None (Preliminary result)   Collection Time: 03/05/17  7:29 PM  Result Value Ref Range Status  Specimen Description BLOOD BLOOD RIGHT FOREARM  Final   Special Requests IN PEDIATRIC BOTTLE Blood Culture adequate volume  Final   Culture NO GROWTH 3 DAYS  Final   Report Status PENDING  Incomplete  MRSA PCR Screening     Status: None   Collection Time: 03/06/17  2:18 AM  Result Value Ref Range Status   MRSA by PCR NEGATIVE NEGATIVE Final    Comment:        The GeneXpert MRSA Assay (FDA approved for NASAL specimens only), is one component of a comprehensive MRSA colonization surveillance program. It is not intended to diagnose MRSA infection nor to guide or monitor treatment for MRSA infections.   Surgical pcr screen     Status: None   Collection Time: 03/07/17  5:59 AM  Result Value Ref Range Status   MRSA, PCR NEGATIVE NEGATIVE Final   Staphylococcus aureus NEGATIVE NEGATIVE Final    Comment:        The Xpert SA Assay (FDA approved for NASAL specimens in patients over 60 years of age), is one component of a comprehensive surveillance program.  Test performance has been validated by [facility] for patients greater than or equal to 81 year old. It is not intended to diagnose infection nor  to guide or monitor treatment.   Aerobic/Anaerobic Culture (surgical/deep wound)     Status: None (Preliminary result)   Collection Time: 03/07/17  9:15 AM  Result Value Ref Range Status   Specimen Description GROIN  Final   Special Requests LEFT  Final   Gram Stain   Final    FEW WBC PRESENT,BOTH PMN AND MONONUCLEAR NO ORGANISMS SEEN    Culture CULTURE REINCUBATED FOR BETTER GROWTH  Final   Report Status PENDING  Incomplete         Radiology Studies: No results found.      Scheduled Meds: . calcitRIOL  1.25 mcg Oral Once per day on Tue Thu Sat  . calcium acetate  2,001 mg Oral TID WC  . cinacalcet  90 mg Oral Q T,Th,Sat-1800  . enoxaparin (LOVENOX) injection  30 mg Subcutaneous Daily  . feeding supplement (NEPRO CARB STEADY)  237 mL Oral BID BM  . feeding supplement (PRO-STAT SUGAR FREE 64)  30 mL Oral BID  . gabapentin  100 mg Oral BID  . heparin  1,000 Units Dialysis Once in dialysis  . midodrine  10 mg Oral TID WC  . senna  1 tablet Oral BID  . sevelamer carbonate  3,200 mg Oral TID WC  . sodium chloride flush  3 mL Intravenous Q12H   Continuous Infusions: . sodium chloride 250 mL (03/06/17 0238)  . sodium chloride    . sodium chloride    . vancomycin 750 mg (03/06/17 2130)     LOS: 2 days    Time spent: 30    Kaneisha Ellenberger, MD, FACP, FHM. [facility] Pager [phone]  If 7PM-7AM, please contact night-coverage [email or web address] Password [facility] 03/08/2017, 4:57 PM

## 2017-03-08 NOTE — Progress Notes (Signed)
Dialysis treatment completed.  1500 mL ultrafiltrated and net fluid removal 1000 mL.    Patient status unchanged. Lung sounds diminished to ausculation in all fields. No edema. Cardiac: NSR.  Disconnected lines and removed needles.  Pressure held for 10 minutes and band aid/gauze dressing applied.  Report given to bedside RN, Lynden Ang.

## 2017-03-08 NOTE — Progress Notes (Addendum)
  Progress Note  SUBJECTIVE:    POD #1  Left groin is sore.   OBJECTIVE:   Vitals:   03/08/17 0013 03/08/17 0419  BP: 104/63 (!) 90/55  Pulse: (!) 105 72  Resp: 18 18  Temp: 98.4 F (36.9 C) 97.9 F (36.6 C)  SpO2: 94% 95%    Intake/Output Summary (Last 24 hours) at 03/08/17 0911 Last data filed at 03/08/17 0420  Gross per 24 hour  Intake              642 ml  Output              155 ml  Net              487 ml   Left groin staples intact. Drain with serous drainage. Palpable left leg graft pulse. Dehiscence of distal medial leg incision clean with fibrinous exudate.  Stable dry gangrene of left toes.   ASSESSMENT/PLAN:   66 y.o. male is s/p: excision of left groin lymphocele 1 Day Post-Op   Continue JP drain given high output.  Continue daily dressing changes to dehisced distal leg leg wound.  Left leg bypass is patent.  Stable dry gangrenous changes to left toes. No plans for any type of amputation at this time unless things worsen.   Raymond Gurney 03/08/2017 9:11 AM -- LABS:   CBC    Component Value Date/Time   WBC 18.9 (H) 03/07/2017 0636   HGB 11.9 (L) 03/07/2017 0636   HCT 38.8 (L) 03/07/2017 0636   PLT 260 03/07/2017 0636    BMET    Component Value Date/Time   NA 136 03/07/2017 0636   K 3.8 03/07/2017 0636   CL 92 (L) 03/07/2017 0636   CO2 30 03/07/2017 0636   GLUCOSE 90 03/07/2017 0636   BUN 28 (H) 03/07/2017 0636   CREATININE 6.30 (H) 03/07/2017 0636   CALCIUM 8.2 (L) 03/07/2017 0636   GFRNONAA 8 (L) 03/07/2017 0636   GFRAA 10 (L) 03/07/2017 0636    COAG Lab Results  Component Value Date   INR 1.29 03/07/2017   INR 1.36 03/05/2017   INR 1.22 02/11/2017   No results found for: PTT  ANTIBIOTICS:   Anti-infectives    Start     Dose/Rate Route Frequency Ordered Stop   03/07/17 0600  ceFAZolin (ANCEF) IVPB 1 g/50 mL premix  Status:  Discontinued    Comments:  Send with pt to OR   1 g 100 mL/hr over 30 Minutes Intravenous To  Surgery 03/06/17 1021 03/07/17 1118   03/06/17 2018  Vancomycin (VANCOCIN) 750-5 MG/150ML-% IVPB    Comments:  Kiang, Angelito   : cabinet override      03/06/17 2018 03/06/17 2130   03/06/17 1200  vancomycin (VANCOCIN) IVPB 750 mg/150 ml premix     750 mg 150 mL/hr over 60 Minutes Intravenous Every T-Th-Sa (Hemodialysis) 03/06/17 0130     03/05/17 2345  vancomycin (VANCOCIN) 1,500 mg in sodium chloride 0.9 % 500 mL IVPB     1,500 mg 250 mL/hr over 120 Minutes Intravenous  Once 03/05/17 2342 03/06/17 0229       Maris Berger, PA-C [facility] Office: [phone] Pager: [phone] 03/08/2017 9:11 AM   I have examined the patient, reviewed and agree with above.  Gretta Began, MD 03/08/2017 10:30 AM

## 2017-03-08 NOTE — Progress Notes (Signed)
[facility] Progress Note   Subjective:  Seen in room, resting quietly but wakes easily. Mood improved today, says pain in better. No CP or dyspnea.   Objective Vitals:   03/07/17 2008 03/08/17 0013 03/08/17 0419 03/08/17 0900  BP: 90/67 104/63 (!) 90/55 (!) 107/51  Pulse: 79 (!) 105 72 79  Resp: 18 18 18 18   Temp: 97.8 F (36.6 C) 98.4 F (36.9 C) 97.9 F (36.6 C) 98.2 F (36.8 C)  TempSrc: Oral Oral Oral Oral  SpO2: 98% 94% 95% 98%  Weight: 70.8 kg (156 lb)     Height:       Physical Exam General: Well appearing, NAD. Heart: RRR; no murmur Lungs: CTA anteriorly Abdomen: Stapled incision in L groin, drain remains in place. Extremities: Darkened B LE, L toes with gangrenous changes Dialysis Access: AVF  Additional Objective Labs: Basic Metabolic Panel:  Recent Labs Lab 03/05/17 1741 03/06/17 0408 03/07/17 0636  NA 137 140 136  K 4.2 4.2 3.8  CL 95* 94* 92*  CO2 26 29 30   GLUCOSE 111* 113* 90  BUN 35* 36* 28*  CREATININE 8.00* 8.48* 6.30*  CALCIUM 8.8* 8.6* 8.2*  PHOS  --  5.9*  --    Liver Function Tests:  Recent Labs Lab 03/05/17 1741 03/06/17 0408  AST 20 16  ALT 9* 8*  ALKPHOS 150* 133*  BILITOT 0.9 0.8  PROT 6.9 6.3*  ALBUMIN 2.8* 2.5*   CBC:  Recent Labs Lab 03/05/17 1741 03/06/17 0408 03/07/17 0636  WBC 24.3* 20.8* 18.9*  NEUTROABS 22.3*  --   --   HGB 12.6* 11.9* 11.9*  HCT 40.2 37.9* 38.8*  MCV 87.2 86.1 87.6  PLT 243 221 260   Medications: . sodium chloride 250 mL (03/06/17 0238)  . sodium chloride    . sodium chloride    . vancomycin 750 mg (03/06/17 2130)   . calcitRIOL  1.25 mcg Oral Once per day on Tue Thu Sat  . calcium acetate  2,001 mg Oral TID WC  . cinacalcet  90 mg Oral Q T,Th,Sat-1800  . enoxaparin (LOVENOX) injection  30 mg Subcutaneous Daily  . feeding supplement (NEPRO CARB STEADY)  237 mL Oral BID BM  . feeding supplement (PRO-STAT SUGAR FREE 64)  30 mL Oral BID  . gabapentin  100 mg Oral  BID  . heparin  1,000 Units Dialysis Once in dialysis  . midodrine  10 mg Oral TID WC  . senna  1 tablet Oral BID  . sevelamer carbonate  3,200 mg Oral TID WC  . sodium chloride flush  3 mL Intravenous Q12H    Dialysis Orders: [facility] 3h  72kg  2/2 bath  AVF  Hep 1400 - Calcitriol 1.85mcg PO q HD - Venofer 100mg  x 10 ordered (4 given so far) for tsat 20% on 7/26. - Was getting sensipar 180mg  PO q HD (held on 7/28 d/t low Ca)  Assessment/Plan: 1. LLE and R foot cellulitis/dry gangrene L toes: On empiric abx. BCx 8/6 pending. VVS following, unclear plan for any surgical intervention. In the past, he had adamantly refused toe amputations. 2. L groin lymphocele: s/p excision of lymphocele 8/8, drain in place. 3. ESRD: Continue TTS HD, next 8/9.  4. BP/volume: Chronic hypotension, on midodrine. No edema. 5. Anemia: Hgb 11.9. No ESA for now. IV iron not continued inpatient d/t infection. 6. Metabolic bone disease: Ca ok, Phos slightly high. Continued Phoslo/Renvela as binders, and reduced Sensipar 180 ->90mg  PO  TTS. Restarted PO calcitriol. 7. Nutrition: Alb low (2.5), continue Nepro. 8.  HFrEF (EF 15-20% with AICD): asymptomatic, per primary.    Ozzie Hoyle, PA-C 03/08/2017, 12:10 PM  [facility] Pager: [phone]  Pt seen, examined and agree w A/P as above.  Vinson Moselle MD [employer] pager [phone]   03/08/2017, 12:28 PM

## 2017-03-08 NOTE — Progress Notes (Signed)
Patient arrived to unit per bed.  Reviewed treatment plan and this RN agrees.  Report received from bedside RN, Albin Felling.  Consent verified.  Patient A & O X 4. Lung sounds diminished to ausculation in all fields. No edema. Cardiac: V - paced.  Prepped LLAVF with alcohol and cannulated with two 15 gauge needles.  Pulsation of blood noted.  Flushed access well with saline per protocol.  Connected and secured lines and initiated tx at 1733.  UF goal of 1500 mL and net fluid removal of 1000 mL.  Will continue to monitor.

## 2017-03-09 LAB — CBC
HEMATOCRIT: 39.5 % (ref 39.0–52.0)
HEMOGLOBIN: 12.4 g/dL — AB (ref 13.0–17.0)
MCH: 27.1 pg (ref 26.0–34.0)
MCHC: 31.4 g/dL (ref 30.0–36.0)
MCV: 86.2 fL (ref 78.0–100.0)
Platelets: 296 10*3/uL (ref 150–400)
RBC: 4.58 MIL/uL (ref 4.22–5.81)
RDW: 19.3 % — AB (ref 11.5–15.5)
WBC: 12.7 10*3/uL — AB (ref 4.0–10.5)

## 2017-03-09 MED ORDER — RENA-VITE PO TABS
1.0000 | ORAL_TABLET | Freq: Every day | ORAL | Status: DC
  Administered 2017-03-09 – 2017-03-12 (×4): 1 via ORAL
  Filled 2017-03-09 (×4): qty 1

## 2017-03-09 MED ORDER — CEFTAZIDIME 1 G IJ SOLR
1.0000 g | INTRAMUSCULAR | Status: DC
  Administered 2017-03-09 – 2017-03-15 (×7): 1 g via INTRAVENOUS
  Filled 2017-03-09 (×8): qty 1

## 2017-03-09 NOTE — Progress Notes (Signed)
Pharmacy Antibiotic Note  Willie Andrade is a 66 y.o. male admitted on 03/05/2017 with Pseudomona L-groin wound infection.  Pharmacy has been consulted for Ceftazidime dosing. Patient is in ESRD with dialysis (HD) every Tuesday, Thursday, and Saturday. WBC is trending down.   Plan: Ceftazidime 1g IV every 24 hours every evening - give post HD on HD days.  Follow-up clinical response and dialysis plans  Height: 6\' 2"  (188 cm) Weight: 154 lb 15.7 oz (70.3 kg) IBW/kg (Calculated) : 82.2  Temp (24hrs), Avg:98.1 F (36.7 C), Min:97.8 F (36.6 C), Max:98.4 F (36.9 C)   Recent Labs Lab 03/05/17 1741 03/05/17 1750 03/06/17 0408 03/06/17 0844 03/07/17 0636 03/08/17 1730 03/08/17 1748 03/09/17 0404  WBC 24.3*  --  20.8*  --  18.9*  --  14.2* 12.7*  CREATININE 8.00*  --  8.48*  --  6.30* 8.15*  --   --   LATICACIDVEN  --  2.63* 2.1* 1.6  --   --   --   --     Estimated Creatinine Clearance: 8.9 mL/min (A) (by C-G formula based on SCr of 8.15 mg/dL (H)).    Allergies  Allergen Reactions  . No Known Allergies     Antimicrobials this admission: Vancomycin 8/7>>8/10 Ceftazidime 8/10 >>  Dose adjustments this admission:  Microbiology results: 8/6 Blood: ngtd x4 days 8/7 MRSA PCR: negative 8/8 L-groin wound: rare Pseudomonas aeruginosa (pan sensitive)  Thank you for allowing pharmacy to be a part of this patient's care.  Link Snuffer, PharmD, BCPS Clinical Pharmacist Clinical phone 03/09/2017 until 11PM [phone] After hours, please call #[phone] 03/09/2017 6:01 PM

## 2017-03-09 NOTE — Progress Notes (Signed)
Pharmacy Antibiotic Note  Willie Andrade is a 66 y.o. male with h/o ESRD on HD, admitted on 03/05/2017 with LLE cellulitis/gangrene.    JP drain in place Afebrile, WBC WNL HD on schedule  Plan: Vancomycin 750 mg iv QHD (Tues, Thurs, Sat Continue to follow   Height: 6\' 2"  (188 cm) Weight: 154 lb 15.7 oz (70.3 kg) IBW/kg (Calculated) : 82.2  Temp (24hrs), Avg:98.1 F (36.7 C), Min:98 F (36.7 C), Max:98.4 F (36.9 C)   Recent Labs Lab 03/05/17 1741 03/05/17 1750 03/06/17 0408 03/06/17 0844 03/07/17 0636 03/08/17 1730 03/08/17 1748 03/09/17 0404  WBC 24.3*  --  20.8*  --  18.9*  --  14.2* 12.7*  CREATININE 8.00*  --  8.48*  --  6.30* 8.15*  --   --   LATICACIDVEN  --  2.63* 2.1* 1.6  --   --   --   --     Estimated Creatinine Clearance: 8.9 mL/min (A) (by C-G formula based on SCr of 8.15 mg/dL (H)).    Allergies  Allergen Reactions  . No Known Allergies     Thank you Okey Regal, PharmD [phone] 03/09/2017 10:39 AM

## 2017-03-09 NOTE — Progress Notes (Signed)
PT Cancellation Note  Patient Details Name: Willie Andrade MRN: [id number] DOB: [birth year]   Cancelled Treatment:    Reason Eval/Treat Not Completed: Pain limiting ability to participate. Pt continues to have increased c/o pain in bilateral LE's. States that he will try again tomorrow, but is hurting too much to participate today. RN notified.    Colin Broach PT, DPT  [phone]  03/09/2017, 3:37 PM

## 2017-03-09 NOTE — Care Management Important Message (Signed)
Important Message  Patient Details  Name: Willie Andrade MRN: [id number] Date of Birth: 02/02/1951   Medicare Important Message Given:  Yes    [name] 03/09/2017, 1:39 PM

## 2017-03-09 NOTE — Progress Notes (Signed)
PROGRESS NOTE    Willie Andrade  ZOX:[id number] DOB: [birth year] DOA: 03/05/2017 PCP: [name], MD  Outpatient Specialists:     Brief Narrative:  66 y/o ? 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 Drain in place. Management per vascular surgery >continuing JP drain due to high output. - Discussed in detail with Dr. Arbie Cookey, Vascular Surgery on 8/10. No indication for further antibiotics, can be discharged from their perspective with drain in place, home health services to assist with drain management and outpatient follow-up with them in the office. Patient continues to report significant pain.  Sepsis/left lower extremity and right foot cellulitis/dry gangrene of left toes   - On empiric antibiotics/IV vancomycin and Ancef. Patient has declined 2 amputations in the past.   - As per vascular surgery follow-up, continue daily dressing changes to dehisced distal left leg wound. Left leg bypass is patent. Stable dry gangrenous changes to left toes. No plan for any type of amputation at this time unless things worsen.  - Blood cultures 2: Negative to  date.   - As discussed with vascular surgery, current skin findings are all chronic related to his PAD and no features suggestive of cellulitis and okay to discontinue antibiotics. Monitor off of antibiotics.   - However subsequently noted left groin culture results showing Pseudomonas. Change antibiotics to [facility] which can be given across dialysis.   LLE fem-pop bypass 6/25  Still necrotic changes as well as open wound at distal medial harvest area--Appreciate woc nurse input  Chr full thickness wound to be dressed with moist gz + foam dressing  No Current concern for osteomyelitis on x-rays--patient will need close monitoring  - Please see above.  End-stage renal disease Tuesday Thursday Saturday HD. Nephrology follow-up appreciated.  Chronic hypotension associated with dialysis--continue Midodrine 10 mg 3 times a day. Stable.  Anemia of renal disease. Stable.  Secondary hypoparathyroidism  Management per nephrology.  Chronic systolic heart failure Cardiomyopathy EF 15% 12/23/16/AICD. Does not look overtly volume overloaded. Volume management across HD.  Chronic pain  Etiology unclear  Minimize tramadol and Percocet going forward.   DVT prophylaxis: Lovenox CODE STATUS: Full Family communication: None at bedside. Disposition: DC home when stable. Patient states that he lives with his sister and ambulates with the help of a cane and a walker. Patient has not yet worked with PT.  Consultants:   nephro  vasc  Procedures:   Excision of lymphocele 8/7.  HD  Antimicrobials:   Vancomycin-discontinue  Ancef -discontinued  [place].   Subjective:  Reports that his lower extremity pain is better. Eager to go home. No other complaints reported.  Objective: Vitals:   03/08/17 2134 03/09/17 0457 03/09/17 0900 03/09/17 1713  BP: 110/62 92/63 96/61  99/71  Pulse: 75 79 72 68  Resp: 18 18 16 18   Temp: 98 F (36.7 C) 98.1 F (36.7 C) 98.4 F (36.9 C) 97.8 F (36.6 C)    TempSrc: Oral Oral Oral Oral  SpO2: 99% 98% 98% 97%  Weight:      Height:        Intake/Output Summary (Last 24 hours) at 03/09/17 1748 Last data filed at 03/09/17 1604  Gross per 24 hour  Intake             1240 ml  Output             1075 ml  Net              165 ml   Filed Weights   03/07/17 2008 03/08/17 1718 03/08/17 2103  Weight: 70.8 kg (156 lb) 71.3 kg (157 lb 3 oz) 70.3 kg (154 lb 15.7 oz)    Examination:  Gen. exam: Pleasant middle-aged male lying comfortably propped up in bed.  Respiratory system: Clear to auscultation. No increased work of breathing. Stable. Cardiovascular system: S1 and S2 heard. RRR. No JVD, murmurs. 1+ pitting bilateral leg edema. Stable without change. Abdomen: Nondistended, soft and nontender. Left groin surgical site staples intact without acute findings. Drain in place. CNS: Alert and oriented. No focal neurological deficits. Extremities: Dry gangrene changes of left toes Dressing over left shin clean and dry. There is discoloration of right foot as well. All findings appear chronic and no features to suggest acute infection.     Data Reviewed: I have personally reviewed following labs and imaging studies  CBC:  Recent Labs Lab 03/05/17 1741 03/06/17 0408 03/07/17 0636 03/08/17 1748 03/09/17 0404  WBC 24.3* 20.8* 18.9* 14.2* 12.7*  NEUTROABS 22.3*  --   --   --   --   HGB 12.6* 11.9* 11.9* 11.2* 12.4*  HCT 40.2 37.9* 38.8* 35.7* 39.5  MCV 87.2 86.1 87.6 85.2 86.2  PLT 243 221 260 304 296   Basic Metabolic Panel:  Recent Labs Lab 03/05/17 1741 03/06/17 0408 03/07/17 0636 03/08/17 1730  NA 137 140 136 135  K 4.2 4.2 3.8 4.1  CL 95* 94* 92* 96*  CO2 26 29 30 25   GLUCOSE 111* 113* 90 96  BUN 35* 36* 28* 47*  CREATININE 8.00* 8.48* 6.30* 8.15*  CALCIUM 8.8* 8.6* 8.2* 7.9*  MG  --  1.9  --   --   PHOS  --  5.9*  --  4.2   GFR: Estimated Creatinine Clearance: 8.9 mL/min (A) (by C-G formula based on SCr of 8.15 mg/dL  (H)). Liver Function Tests:  Recent Labs Lab 03/05/17 1741 03/06/17 0408 03/08/17 1730  AST 20 16  --   ALT 9* 8*  --   ALKPHOS 150* 133*  --   BILITOT 0.9 0.8  --   PROT 6.9 6.3*  --   ALBUMIN 2.8* 2.5* 2.3*   No results for input(s): LIPASE, AMYLASE in the last 168 hours. No results for input(s): AMMONIA in the last 168 hours. Coagulation Profile:  Recent Labs Lab 03/05/17 1741 03/07/17 0636  INR 1.36 1.29   Cardiac Enzymes: No results for input(s): CKTOTAL, CKMB, CKMBINDEX, TROPONINI in the last 168 hours. BNP (last 3 results) No results for input(s): PROBNP in the last 8760 hours. HbA1C: No results for input(s): HGBA1C in the last 72 hours. CBG:  No results for input(s): GLUCAP in the last 168 hours. Lipid Profile: No results for input(s): CHOL, HDL, LDLCALC, TRIG, CHOLHDL, LDLDIRECT in the last 72 hours. Thyroid Function Tests: No results for input(s): TSH, T4TOTAL, FREET4, T3FREE, THYROIDAB in the last 72 hours. Anemia Panel: No results for input(s): VITAMINB12, FOLATE, FERRITIN, TIBC, IRON, RETICCTPCT in the last 72 hours. Urine analysis: No results found for: COLORURINE, APPEARANCEUR, LABSPEC, PHURINE, GLUCOSEU, HGBUR, BILIRUBINUR, KETONESUR, PROTEINUR, UROBILINOGEN, NITRITE, LEUKOCYTESUR Sepsis Labs: @LABRCNTIP (procalcitonin:4,lacticidven:4)  ) Recent Results (from the past 240 hour(s))  Culture, blood (Routine x 2)     Status: None (Preliminary result)   Collection Time: 03/05/17  5:40 PM  Result Value Ref Range Status   Specimen Description BLOOD RIGHT HAND  Final   Special Requests IN PEDIATRIC BOTTLE Blood Culture adequate volume  Final   Culture NO GROWTH 4 DAYS  Final   Report Status PENDING  Incomplete  Culture, blood (Routine x 2)     Status: None (Preliminary result)   Collection Time: 03/05/17  7:29 PM  Result Value Ref Range Status   Specimen Description BLOOD BLOOD RIGHT FOREARM  Final   Special Requests IN PEDIATRIC BOTTLE Blood Culture  adequate volume  Final   Culture NO GROWTH 4 DAYS  Final   Report Status PENDING  Incomplete  MRSA PCR Screening     Status: None   Collection Time: 03/06/17  2:18 AM  Result Value Ref Range Status   MRSA by PCR NEGATIVE NEGATIVE Final    Comment:        The GeneXpert MRSA Assay (FDA approved for NASAL specimens only), is one component of a comprehensive MRSA colonization surveillance program. It is not intended to diagnose MRSA infection nor to guide or monitor treatment for MRSA infections.   Surgical pcr screen     Status: None   Collection Time: 03/07/17  5:59 AM  Result Value Ref Range Status   MRSA, PCR NEGATIVE NEGATIVE Final   Staphylococcus aureus NEGATIVE NEGATIVE Final    Comment:        The Xpert SA Assay (FDA approved for NASAL specimens in patients over 70 years of age), is one component of a comprehensive surveillance program.  Test performance has been validated by [facility] for patients greater than or equal to 43 year old. It is not intended to diagnose infection nor to guide or monitor treatment.   Aerobic/Anaerobic Culture (surgical/deep wound)     Status: None (Preliminary result)   Collection Time: 03/07/17  9:15 AM  Result Value Ref Range Status   Specimen Description GROIN  Final   Special Requests LEFT  Final   Gram Stain   Final    FEW WBC PRESENT,BOTH PMN AND MONONUCLEAR NO ORGANISMS SEEN    Culture RARE PSEUDOMONAS AERUGINOSA  Final   Report Status PENDING  Incomplete   Organism ID, Bacteria PSEUDOMONAS AERUGINOSA  Final      Susceptibility   Pseudomonas aeruginosa - MIC*    CEFTAZIDIME 4 SENSITIVE Sensitive     CIPROFLOXACIN <=0.25 SENSITIVE Sensitive     GENTAMICIN <=1 SENSITIVE Sensitive     IMIPENEM 2 SENSITIVE Sensitive     PIP/TAZO 8 SENSITIVE Sensitive     CEFEPIME 2 SENSITIVE Sensitive     * RARE PSEUDOMONAS AERUGINOSA         Radiology Studies: No results found.      Scheduled Meds: . calcitRIOL  1.25 mcg  Oral Once per day on Tue Thu Sat  .  calcium acetate  2,001 mg Oral TID WC  . cinacalcet  90 mg Oral Q T,Th,Sat-1800  . enoxaparin (LOVENOX) injection  30 mg Subcutaneous Daily  . feeding supplement (NEPRO CARB STEADY)  237 mL Oral BID BM  . feeding supplement (PRO-STAT SUGAR FREE 64)  30 mL Oral BID  . gabapentin  100 mg Oral BID  . midodrine  10 mg Oral TID WC  . multivitamin  1 tablet Oral Daily  . senna  1 tablet Oral BID  . sodium chloride flush  3 mL Intravenous Q12H   Continuous Infusions: . sodium chloride 250 mL (03/06/17 0238)  . vancomycin Stopped (03/08/17 2100)     LOS: 3 days    Time spent: 30    Amayia Ciano, MD, FACP, FHM. [facility] Pager [phone]  If 7PM-7AM, please contact night-coverage [email or web address] Password [facility] 03/09/2017, 5:48 PM

## 2017-03-09 NOTE — Progress Notes (Signed)
Subjective:  Tolerated HD yest. Wants to know "when Drain Out "  Objective Vital signs in last 24 hours: Vitals:   03/08/17 2103 03/08/17 2106 03/08/17 2134 03/09/17 0457  BP: 108/60 (!) 106/53 110/62 92/63  Pulse: 71 73 75 79  Resp: 18  18 18   Temp: 98 F (36.7 C)  98 F (36.7 C) 98.1 F (36.7 C)  TempSrc:   Oral Oral  SpO2:   99% 98%  Weight: 70.3 kg (154 lb 15.7 oz)     Height:       Weight change: 0.539 kg (1 lb 3 oz)  Physical Exam: General: Alert chronically ill AAM,  NAD. Heart: RRR; no murmur Lungs: CTA anteriorly Abdomen: Stapled incision in L groin, drain remains in place. Extremities: Darkened B LE, L toes with gangrenous changes Dialysis Access: AVF pos bruit    Dialysis Orders: [facility] 3h 72kg 2/2 bath AVF Hep 1400 - Calcitriol 1.38mcg PO q HD - Venofer 100mg  x 10 ordered (4 given so far) for tsat 20% on 7/26. - Was getting sensipar 180mg  PO q HD (held on 7/28 d/t low Ca)  Problems: 1. L groin lymphocele: s/p excision of lymphocele 8/8, drain in place. Per VVS.  2. LLE and R foot cellulitis/dry gangrene L toes: On empiric IV vanc. VVS following,  In the past, he had adamantly refused toe amputations. 3. ESRD: Continue TTS HD, next 8/11.  4. BP/volume: Chronic hypotension, on midodrine. . 5. Anemia: Hgb 12.4. No ESA for now. IV iron not continued inpatient d/t infection. 6. Metabolic bone disease: Ca ok, Phos now down to 4.2 in hosp Ca core 9.5 on Phoslo/Renvela as binders, will decrease to only Phoslo/  reduced Sensipar 180 ->90mg  PO TTS. Restarted PO calcitriol. 7. Nutrition: Alb low (2.3), continue Nepro. 8. HFrEF (EF 15-20% with AICD): asymptomatic, per primary.    Willie Pastel, PA-C [facility] Beeper [phone] 03/09/2017,8:52 AM  LOS: 3 days   Pt seen, examined and agree w A/P as above.  Willie Moselle MD [facility] pager [phone]   03/09/2017, 12:19 PM    Labs: Basic Metabolic Panel:  Recent  Labs Lab 03/06/17 0408 03/07/17 0636 03/08/17 1730  NA 140 136 135  K 4.2 3.8 4.1  CL 94* 92* 96*  CO2 29 30 25   GLUCOSE 113* 90 96  BUN 36* 28* 47*  CREATININE 8.48* 6.30* 8.15*  CALCIUM 8.6* 8.2* 7.9*  PHOS 5.9*  --  4.2   Liver Function Tests:  Recent Labs Lab 03/05/17 1741 03/06/17 0408 03/08/17 1730  AST 20 16  --   ALT 9* 8*  --   ALKPHOS 150* 133*  --   BILITOT 0.9 0.8  --   PROT 6.9 6.3*  --   ALBUMIN 2.8* 2.5* 2.3*   CBC:  Recent Labs Lab 03/05/17 1741 03/06/17 0408 03/07/17 0636 03/08/17 1748 03/09/17 0404  WBC 24.3* 20.8* 18.9* 14.2* 12.7*  NEUTROABS 22.3*  --   --   --   --   HGB 12.6* 11.9* 11.9* 11.2* 12.4*  HCT 40.2 37.9* 38.8* 35.7* 39.5  MCV 87.2 86.1 87.6 85.2 86.2  PLT 243 221 260 304 296   Cardiac Enzymes: No results for input(s): CKTOTAL, CKMB, CKMBINDEX, TROPONINI in the last 168 hours. CBG: No results for input(s): GLUCAP in the last 168 hours.  Studies/Results: No results found. Medications: . sodium chloride 250 mL (03/06/17 0238)  . sodium chloride    . sodium chloride    .  vancomycin Stopped (03/08/17 2100)   . calcitRIOL  1.25 mcg Oral Once per day on Tue Thu Sat  . calcium acetate  2,001 mg Oral TID WC  . cinacalcet  90 mg Oral Q T,Th,Sat-1800  . enoxaparin (LOVENOX) injection  30 mg Subcutaneous Daily  . feeding supplement (NEPRO CARB STEADY)  237 mL Oral BID BM  . feeding supplement (PRO-STAT SUGAR FREE 64)  30 mL Oral BID  . gabapentin  100 mg Oral BID  . heparin  1,000 Units Dialysis Once in dialysis  . midodrine  10 mg Oral TID WC  . senna  1 tablet Oral BID  . sevelamer carbonate  3,200 mg Oral TID WC  . sodium chloride flush  3 mL Intravenous Q12H

## 2017-03-09 NOTE — Progress Notes (Signed)
PT Cancellation Note  Patient Details Name: Willie Andrade MRN: [id number] DOB: 08/10/1950   Cancelled Treatment:    Reason Eval/Treat Not Completed: Patient declined, no reason specified;Other (comment) Pt reports that he is hurting too much to get up right now. Reports he is in pain every time he tries to get up. Per RN, pt wants a WC before DC. Will check back as time allows.   Colin Broach PT, DPT  [phone]  03/09/2017, 12:19 PM

## 2017-03-09 NOTE — Progress Notes (Signed)
Patient ID: Willie Andrade, male   DOB: 05-15-1951, 66 y.o.   MRN: [id number] Comfortable. Easily palpable left subcutaneous femoral peroneal graft pulse. No change in left foot with dry gangrene of his fourth toe and tips of the second and third toe. No evidence of fluctuance or infection  Left groin incision healing. Jackson-Pratt drain intact with the approximately 70 cc per shift drainage. No evidence of recurrent lymphocele  Okay for discharge from vascular surgery standpoint. No indication for antibiotics from vascular surgery standpoint.  We will see him in the office in one week for suture removal. Will need to have Jackson-Pratt drain emptied and recharged on a daily basis as an outpatient. Would be beneficial to monitor the output but this is not essential.

## 2017-03-10 DIAGNOSIS — T814XXD Infection following a procedure, subsequent encounter: Secondary | ICD-10-CM

## 2017-03-10 LAB — CULTURE, BLOOD (ROUTINE X 2)
Culture: NO GROWTH
Culture: NO GROWTH
Special Requests: ADEQUATE
Special Requests: ADEQUATE

## 2017-03-10 LAB — CBC
HCT: 38.3 % — ABNORMAL LOW (ref 39.0–52.0)
Hemoglobin: 12.1 g/dL — ABNORMAL LOW (ref 13.0–17.0)
MCH: 27.1 pg (ref 26.0–34.0)
MCHC: 31.6 g/dL (ref 30.0–36.0)
MCV: 85.9 fL (ref 78.0–100.0)
PLATELETS: 326 10*3/uL (ref 150–400)
RBC: 4.46 MIL/uL (ref 4.22–5.81)
RDW: 19.1 % — ABNORMAL HIGH (ref 11.5–15.5)
WBC: 11.4 10*3/uL — ABNORMAL HIGH (ref 4.0–10.5)

## 2017-03-10 LAB — RENAL FUNCTION PANEL
Albumin: 2.3 g/dL — ABNORMAL LOW (ref 3.5–5.0)
Anion gap: 15 (ref 5–15)
BUN: 41 mg/dL — ABNORMAL HIGH (ref 6–20)
CHLORIDE: 95 mmol/L — AB (ref 101–111)
CO2: 25 mmol/L (ref 22–32)
CREATININE: 6.51 mg/dL — AB (ref 0.61–1.24)
Calcium: 8.6 mg/dL — ABNORMAL LOW (ref 8.9–10.3)
GFR calc Af Amer: 9 mL/min — ABNORMAL LOW (ref >60)
GFR calc non Af Amer: 8 mL/min — ABNORMAL LOW (ref >60)
GLUCOSE: 99 mg/dL (ref 65–99)
Phosphorus: 4.1 mg/dL (ref 2.5–4.6)
Potassium: 4.4 mmol/L (ref 3.5–5.1)
SODIUM: 135 mmol/L (ref 135–145)

## 2017-03-10 MED ORDER — MORPHINE SULFATE (PF) 2 MG/ML IV SOLN
2.0000 mg | Freq: Once | INTRAVENOUS | Status: AC
  Administered 2017-03-10: 2 mg via INTRAVENOUS
  Filled 2017-03-10: qty 1

## 2017-03-10 MED ORDER — OXYCODONE-ACETAMINOPHEN 5-325 MG PO TABS
2.0000 | ORAL_TABLET | Freq: Once | ORAL | Status: AC
  Administered 2017-03-10: 2 via ORAL

## 2017-03-10 MED ORDER — MIDODRINE HCL 5 MG PO TABS
ORAL_TABLET | ORAL | Status: AC
  Filled 2017-03-10: qty 2

## 2017-03-10 MED ORDER — OXYCODONE-ACETAMINOPHEN 5-325 MG PO TABS
ORAL_TABLET | ORAL | Status: AC
  Filled 2017-03-10: qty 2

## 2017-03-10 MED ORDER — CALCITRIOL 0.5 MCG PO CAPS
ORAL_CAPSULE | ORAL | Status: AC
  Filled 2017-03-10: qty 2

## 2017-03-10 NOTE — Progress Notes (Signed)
Patient has puss draining from top area of fem pop of left leg. Foam dressing placed. Dr. Waymon Amato and Dr. Randie Heinz notified.  Per Dr. Randie Heinz place dressing over site.

## 2017-03-10 NOTE — Progress Notes (Addendum)
PROGRESS NOTE    Willie Andrade  WGN:[id number] DOB: Mar 26, 1951 DOA: 03/05/2017 PCP: [name], MD  Outpatient Specialists:     Brief Narrative:  66 y/o ? 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 Drain in place. Management per vascular surgery >continuing JP drain due to high output. - Discussed in detail with Dr. Arbie Cookey, Vascular Surgery on 8/10. No indication for further antibiotics, can be discharged from their perspective with drain in place, home health services to assist with drain management and outpatient follow-up with them in the office. Pain better. - Error made in 03/10/17 note: Purulent drainage was not from this left groin site but from prior saphenectomy site in leg. However, Op culture from the left groin site did grow out Pseudomonas.  Infected Left saphenectomy/vein graft site - On 8/11, RN reported significant purulent drainage from top part of left leg surgical incision site. Has been started on IV Fortaz on 8/10 due to operative cultures growing Pseudomonas. Vascular surgery input appreciated. Patient unwilling to go to or for I&D  today and wishes to wait for Dr. Arbie Cookey to see in am. - D/w Dr. Randie Heinz.  Sepsis/left lower extremity and right foot cellulitis/dry gangrene of left toes   - Was initially on empiric antibiotics/IV vancomycin and Ancef. Patient has declined 2 amputations in the past.   - As per vascular surgery follow-up, continue daily dressing changes to dehisced distal left leg wound. Left leg bypass is patent. Stable dry gangrenous changes to left toes. No plan for any type of amputation at this time unless things worsen.  - Blood cultures 2: Negative to date.   - As discussed with vascular surgery 8/10, current skin findings are all chronic related to his PAD and no features suggestive of cellulitis and okay to discontinue antibiotics from cellulitis stand point.   - Now on IV Fortaz for Pseudomonas isolated in left groin during op cultures and purulent drainage from previous left leg saphenectomy site.    LLE fem-pop bypass 6/25  Still necrotic changes as well as open wound at distal medial harvest area--Appreciate woc nurse input  Chr full thickness wound to be dressed with moist gz + foam dressing  No Current concern for osteomyelitis on x-rays--patient will need close monitoring  - Please see above.  End-stage renal disease Tuesday Thursday Saturday HD. Nephrology follow-up appreciated. Discussed with Dr. Arlean Hopping.  Chronic hypotension associated with dialysis--continue Midodrine 10 mg 3 times a day. Stable.  Anemia of renal disease. Stable.  Secondary hypoparathyroidism  Management per nephrology.  Chronic systolic heart failure Cardiomyopathy EF 15% 12/23/16/AICD. Does not look overtly volume overloaded. Volume management  across HD.  Chronic pain  Etiology unclear. Possibly d/t PAD  Judicious use of opoids   DVT prophylaxis: Lovenox CODE STATUS: Full Family communication: None at bedside. Disposition: DC home when stable. Patient states that he lives with his sister and ambulates with the  help of a cane and a walker. Patient has been refusing to work with PT> requested him to do so, so we can plan DC. Has been refusing PT eval. Discussed again and he agreed to cooperate.  Consultants:   nephro  vasc  Procedures:   Excision of lymphocele 8/7.  HD  Antimicrobials:   Vancomycin-discontinue  Ancef -discontinued  Fortaz 8/10>.   Subjective:   Pain not bad. Satates will work with PT provided he has socks and pants to wear> d/w RN and PT. No left leg wound drainage today.  Objective: Vitals:   03/10/17 1739 03/10/17 2122 03/11/17 0456 03/11/17 1006  BP: 110/71 104/65 99/60 (!) 109/59  Pulse: 62 73 83 79  Resp: 18 18 18 18   Temp: (!) 97.3 F (36.3 C) 97.6 F (36.4 C) 98.7 F (37.1 C) 98.8 F (37.1 C)  TempSrc: Oral Oral Oral Oral  SpO2: 99% 97% 96% 100%  Weight:  71.3 kg (157 lb 3 oz)    Height:        Intake/Output Summary (Last 24 hours) at 03/11/17 1549 Last data filed at 03/11/17 1100  Gross per 24 hour  Intake              350 ml  Output               60 ml  Net              290 ml   Filed Weights   03/10/17 0655 03/10/17 1048 03/10/17 2122  Weight: 69.1 kg (152 lb 5.4 oz) 69.1 kg (152 lb 5.4 oz) 71.3 kg (157 lb 3 oz)    Examination:  Gen. exam: Pleasant middle-aged male lying comfortably propped up in bed.  Respiratory system: Clear to auscultation. No increased work of breathing. Stable. Cardiovascular system: S1 and S2 heard. RRR. No JVD, murmurs. 1+ pitting bilateral leg edema. Stable. Abdomen: Nondistended, soft and nontender. Left groin surgical site staples intact without acute findings. Drain in place> draining creamy colored fluid. No features of acute infection clinically.  CNS: Alert and oriented. No focal neurological deficits. Extremities: Dry gangrene changes of left toes. Left leg saphenectomy site has healed. May have a very tiny opening on the proximal aspect of the scar which is currently not draining even on squeezing.  However, dressing on top is slightly soiled with yellow material.  There is discoloration of right foot as well. All findings appear chronic and no features to suggest acute infection.     Data Reviewed: I have personally reviewed following labs and imaging studies  CBC:  Recent Labs Lab 03/05/17 1741 03/06/17 0408 03/07/17 0636 03/08/17 1748 03/09/17 0404 03/10/17 0727  WBC 24.3* 20.8* 18.9* 14.2* 12.7* 11.4*  NEUTROABS 22.3*  --   --   --   --   --   HGB 12.6* 11.9* 11.9* 11.2* 12.4* 12.1*  HCT 40.2 37.9* 38.8* 35.7* 39.5 38.3*  MCV 87.2 86.1 87.6 85.2 86.2 85.9  PLT 243 221 260 304 296 326   Basic Metabolic Panel:  Recent Labs Lab 03/05/17 1741 03/06/17 0408 03/07/17 0636 03/08/17 1730 03/10/17 0728  NA 137 140 136 135 135  K 4.2 4.2 3.8 4.1 4.4  CL  95* 94* 92* 96* 95*  CO2 26 29 30 25 25   GLUCOSE 111* 113* 90 96 99  BUN 35* 36* 28* 47* 41*  CREATININE 8.00* 8.48* 6.30* 8.15* 6.51*  CALCIUM 8.8* 8.6* 8.2* 7.9* 8.6*  MG  --  1.9  --   --   --   PHOS  --  5.9*  --  4.2 4.1   GFR: Estimated Creatinine Clearance: 11.3 mL/min (A) (by C-G formula based on SCr of 6.51 mg/dL (H)). Liver Function Tests:  Recent Labs Lab 03/05/17 1741 03/06/17 0408 03/08/17 1730 03/10/17 0728  AST 20 16  --   --   ALT 9* 8*  --   --   ALKPHOS 150* 133*  --   --   BILITOT 0.9 0.8  --   --   PROT 6.9 6.3*  --   --   ALBUMIN 2.8* 2.5* 2.3* 2.3*   No results for input(s): LIPASE, AMYLASE in the last 168 hours. No results for input(s): AMMONIA in the last 168 hours. Coagulation Profile:  Recent Labs Lab 03/05/17 1741 03/07/17 0636  INR 1.36 1.29   Cardiac Enzymes: No results for input(s): CKTOTAL, CKMB, CKMBINDEX, TROPONINI in the last 168 hours. BNP (last 3 results) No results for input(s): PROBNP in the last 8760 hours. HbA1C: No results for input(s): HGBA1C in the last 72 hours. CBG: No results for input(s): GLUCAP in the last 168 hours. Lipid Profile: No results  for input(s): CHOL, HDL, LDLCALC, TRIG, CHOLHDL, LDLDIRECT in the last 72 hours. Thyroid Function Tests: No results for input(s): TSH, T4TOTAL, FREET4, T3FREE, THYROIDAB in the last 72 hours. Anemia Panel: No results for input(s): VITAMINB12, FOLATE, FERRITIN, TIBC, IRON, RETICCTPCT in the last 72 hours. Urine analysis: No results found for: COLORURINE, APPEARANCEUR, LABSPEC, PHURINE, GLUCOSEU, HGBUR, BILIRUBINUR, KETONESUR, PROTEINUR, UROBILINOGEN, NITRITE, LEUKOCYTESUR Sepsis Labs: @LABRCNTIP (procalcitonin:4,lacticidven:4)  ) Recent Results (from the past 240 hour(s))  Culture, blood (Routine x 2)     Status: None   Collection Time: 03/05/17  5:40 PM  Result Value Ref Range Status   Specimen Description BLOOD RIGHT HAND  Final   Special Requests IN PEDIATRIC BOTTLE Blood Culture adequate volume  Final   Culture NO GROWTH 5 DAYS  Final   Report Status 03/10/2017 FINAL  Final  Culture, blood (Routine x 2)     Status: None   Collection Time: 03/05/17  7:29 PM  Result Value Ref Range Status   Specimen Description BLOOD BLOOD RIGHT FOREARM  Final   Special Requests IN PEDIATRIC BOTTLE Blood Culture adequate volume  Final   Culture NO GROWTH 5 DAYS  Final   Report Status 03/10/2017 FINAL  Final  MRSA PCR Screening     Status: None   Collection Time: 03/06/17  2:18 AM  Result Value Ref Range Status   MRSA by PCR NEGATIVE NEGATIVE Final    Comment:        The GeneXpert MRSA Assay (FDA approved for NASAL specimens only), is one component of a comprehensive MRSA colonization surveillance program. It is not intended to diagnose MRSA infection nor to guide or monitor treatment for MRSA infections.   Surgical pcr screen     Status: None   Collection Time: 03/07/17  5:59 AM  Result Value Ref Range Status   MRSA, PCR NEGATIVE NEGATIVE Final   Staphylococcus aureus NEGATIVE NEGATIVE Final    Comment:        The Xpert SA Assay (FDA approved for NASAL specimens in patients  over 38  years of age), is one component of a comprehensive surveillance program.  Test performance has been validated by [facility] for patients greater than or equal to 20 year old. It is not intended to diagnose infection nor to guide or monitor treatment.   Aerobic/Anaerobic Culture (surgical/deep wound)     Status: None (Preliminary result)   Collection Time: 03/07/17  9:15 AM  Result Value Ref Range Status   Specimen Description GROIN  Final   Special Requests LEFT  Final   Gram Stain   Final    FEW WBC PRESENT,BOTH PMN AND MONONUCLEAR NO ORGANISMS SEEN    Culture RARE PSEUDOMONAS AERUGINOSA  Final   Report Status PENDING  Incomplete   Organism ID, Bacteria PSEUDOMONAS AERUGINOSA  Final      Susceptibility   Pseudomonas aeruginosa - MIC*    CEFTAZIDIME 4 SENSITIVE Sensitive     CIPROFLOXACIN <=0.25 SENSITIVE Sensitive     GENTAMICIN <=1 SENSITIVE Sensitive     IMIPENEM 2 SENSITIVE Sensitive     PIP/TAZO 8 SENSITIVE Sensitive     CEFEPIME 2 SENSITIVE Sensitive     * RARE PSEUDOMONAS AERUGINOSA         Radiology Studies: No results found.      Scheduled Meds: . calcitRIOL  1.25 mcg Oral Once per day on Tue Thu Sat  . calcium acetate  2,001 mg Oral TID WC  . cinacalcet  90 mg Oral Q T,Th,Sat-1800  . enoxaparin (LOVENOX) injection  30 mg Subcutaneous Daily  . feeding supplement (NEPRO CARB STEADY)  237 mL Oral BID BM  . feeding supplement (PRO-STAT SUGAR FREE 64)  30 mL Oral BID  . gabapentin  100 mg Oral BID  . midodrine  10 mg Oral TID WC  . multivitamin  1 tablet Oral Daily  . senna  1 tablet Oral BID  . sodium chloride flush  3 mL Intravenous Q12H   Continuous Infusions: . sodium chloride 250 mL (03/06/17 0238)  . cefTAZidime (FORTAZ)  IV Stopped (03/10/17 1818)     LOS: 5 days    Time spent: 30    Tangala Wiegert, MD, FACP, FHM. [facility] Pager [phone]  If 7PM-7AM, please contact night-coverage [email or web address] Password [facility] 03/11/2017,  3:49 PM

## 2017-03-10 NOTE — Progress Notes (Signed)
Subjective:  C/o pain in L foot , on HD  Objective Vital signs in last 24 hours: Vitals:   03/10/17 0800 03/10/17 0830 03/10/17 0900 03/10/17 0930  BP: 100/65 113/78 (!) 98/54 (!) 102/57  Pulse: 72 (!) 43 63 (!) 59  Resp:      Temp:      TempSrc:      SpO2:      Weight:      Height:       Weight change: -0.3 kg (-10.6 oz)  Physical Exam: General: Alert chronically ill AAM,  NAD. Heart: RRR; no murmur Lungs: CTA anteriorly Abdomen: Stapled incision in L groin, drain remains in place. Extremities: Darkened B LE, L toes with gangrenous changes Dialysis Access: AVF pos bruit    Dialysis Orders: [facility] 3h 72kg 2/2 bath AVF Hep 1400 - Calcitriol 1.63mcg PO q HD - Venofer 100mg  x 10 ordered (4 given so far) for tsat 20% on 7/26. - Was getting sensipar 180mg  PO q HD (held on 7/28 d/t low Ca)  Problems/Plan: 1. L groin lymphocele: s/p excision of lymphocele 8/8, drain in place. Per VVS.  2. LLE and R foot cellulitis/dry gangrene L toes: On empiric IV vanc. VVS following,  In the past, he had adamantly refused toe amputations. Culture from OR grew pseudomonas and pt is now on [place].  WBC has come down since admission on IV vanc and Ancef. Now has switched to [place] only.  3. ESRD: Continue TTS HD, next 8/11.  4. BP/volume: Chronic hypotension, on midodrine. Under dry wt 2-3kg.  5. Anemia: Hgb 12.4. No ESA for now. IV iron not continued inpatient d/t infection. 6. Metabolic bone disease: Ca ok, Phos now down to 4.2 in hosp Ca core 9.5 on Phoslo/Renvela as binders, will decrease to only Phoslo/  reduced Sensipar 180 ->90mg  PO TTS. Restarted PO calcitriol. 7. Nutrition: Alb low (2.3), continue Nepro. 8. HFrEF (EF 15-20% with AICD): asymptomatic, per primary.     Vinson Moselle MD [facility] pager [phone]   03/10/2017, 9:45 AM    Labs: Basic Metabolic Panel:  Recent Labs Lab 03/06/17 0408 03/07/17 0636 03/08/17 1730 03/10/17 0728  NA 140 136  135 135  K 4.2 3.8 4.1 4.4  CL 94* 92* 96* 95*  CO2 29 30 25 25   GLUCOSE 113* 90 96 99  BUN 36* 28* 47* 41*  CREATININE 8.48* 6.30* 8.15* 6.51*  CALCIUM 8.6* 8.2* 7.9* 8.6*  PHOS 5.9*  --  4.2 4.1   Liver Function Tests:  Recent Labs Lab 03/05/17 1741 03/06/17 0408 03/08/17 1730 03/10/17 0728  AST 20 16  --   --   ALT 9* 8*  --   --   ALKPHOS 150* 133*  --   --   BILITOT 0.9 0.8  --   --   PROT 6.9 6.3*  --   --   ALBUMIN 2.8* 2.5* 2.3* 2.3*   CBC:  Recent Labs Lab 03/05/17 1741 03/06/17 0408 03/07/17 0636 03/08/17 1748 03/09/17 0404 03/10/17 0727  WBC 24.3* 20.8* 18.9* 14.2* 12.7* 11.4*  NEUTROABS 22.3*  --   --   --   --   --   HGB 12.6* 11.9* 11.9* 11.2* 12.4* 12.1*  HCT 40.2 37.9* 38.8* 35.7* 39.5 38.3*  MCV 87.2 86.1 87.6 85.2 86.2 85.9  PLT 243 221 260 304 296 326   Cardiac Enzymes: No results for input(s): CKTOTAL, CKMB, CKMBINDEX, TROPONINI in the last 168 hours. CBG: No results  for input(s): GLUCAP in the last 168 hours.  Studies/Results: No results found. Medications: . sodium chloride 250 mL (03/06/17 0238)  . cefTAZidime (FORTAZ)  IV Stopped (03/09/17 2004)   . midodrine      . calcitRIOL  1.25 mcg Oral Once per day on Tue Thu Sat  . calcium acetate  2,001 mg Oral TID WC  . cinacalcet  90 mg Oral Q T,Th,Sat-1800  . enoxaparin (LOVENOX) injection  30 mg Subcutaneous Daily  . feeding supplement (NEPRO CARB STEADY)  237 mL Oral BID BM  . feeding supplement (PRO-STAT SUGAR FREE 64)  30 mL Oral BID  . gabapentin  100 mg Oral BID  . midodrine  10 mg Oral TID WC  . multivitamin  1 tablet Oral Daily  . senna  1 tablet Oral BID  . sodium chloride flush  3 mL Intravenous Q12H

## 2017-03-10 NOTE — Progress Notes (Signed)
PT Cancellation Note  Patient Details Name: Willie Andrade MRN: [id number] DOB: 1951-07-10   Cancelled Treatment:    Reason Eval/Treat Not Completed: Patient declined, no reason specified . Pt continues to report that he is in a lot of pain and just wants to sleep. Requests PT return tomorrow. Pt encouraged pt to perform mobility for assessment, pt refuses. PT will check back as time allows.  Colin Broach PT, DPT  [phone]  03/10/2017, 3:01 PM

## 2017-03-10 NOTE — Progress Notes (Signed)
  Progress Note    03/10/2017 12:17 PM 3 Days Post-Op  Subjective:  Evaluated on hd, no new complaints  Vitals:   03/10/17 1035 03/10/17 1048  BP: (!) 84/53 (!) 101/59  Pulse: 64 78  Resp:  18  Temp:  97.9 F (36.6 C)  SpO2:      Physical Exam: Left femoral incision cdi with staples Drain with minimal serous drainage Stable gangrenous changes to left toes  CBC    Component Value Date/Time   WBC 11.4 (H) 03/10/2017 0727   RBC 4.46 03/10/2017 0727   HGB 12.1 (L) 03/10/2017 0727   HCT 38.3 (L) 03/10/2017 0727   PLT 326 03/10/2017 0727   MCV 85.9 03/10/2017 0727   MCH 27.1 03/10/2017 0727   MCHC 31.6 03/10/2017 0727   RDW 19.1 (H) 03/10/2017 0727   LYMPHSABS 0.7 03/05/2017 1741   MONOABS 1.2 (H) 03/05/2017 1741   EOSABS 0.0 03/05/2017 1741   BASOSABS 0.0 03/05/2017 1741    BMET    Component Value Date/Time   NA 135 03/10/2017 0728   K 4.4 03/10/2017 0728   CL 95 (L) 03/10/2017 0728   CO2 25 03/10/2017 0728   GLUCOSE 99 03/10/2017 0728   BUN 41 (H) 03/10/2017 0728   CREATININE 6.51 (H) 03/10/2017 0728   CALCIUM 8.6 (L) 03/10/2017 0728   GFRNONAA 8 (L) 03/10/2017 0728   GFRAA 9 (L) 03/10/2017 0728    INR    Component Value Date/Time   INR 1.29 03/07/2017 0636     Intake/Output Summary (Last 24 hours) at 03/10/17 1217 Last data filed at 03/10/17 1048  Gross per 24 hour  Intake          1516.67 ml  Output               50 ml  Net          1466.67 ml     Assessment:  66 y.o. male is s/p left peroneal bypass with in situ vein. Lymphocele drained now with jp.   Plan: Ok for discharge per vascular Will have close f/u with Dr. Arbie Cookey to remove drain.   Norvell Ureste C. Randie Heinz, MD [facility] Office: [phone] Pager: [phone]  03/10/2017 12:17 PM

## 2017-03-10 NOTE — Progress Notes (Signed)
PT Cancellation Note  Patient Details Name: Piercen Ehrsam MRN: [id number] DOB: [birth year]   Cancelled Treatment:    Reason Eval/Treat Not Completed: Patient at procedure or test/unavailable. Pt currently in HD. Will check back as time allows.    Colin Broach PT, DPT  [phone]  03/10/2017, 10:29 AM

## 2017-03-11 NOTE — Progress Notes (Signed)
Subjective:  Now pus draining from L leg wound from June surgery.  Per VVS will need exploration in OR.   Objective Vital signs in last 24 hours: Vitals:   03/10/17 1739 03/10/17 2122 03/11/17 0456 03/11/17 1006  BP: 110/71 104/65 99/60 (!) 109/59  Pulse: 62 73 83 79  Resp: 18 18 18 18   Temp: (!) 97.3 F (36.3 C) 97.6 F (36.4 C) 98.7 F (37.1 C) 98.8 F (37.1 C)  TempSrc: Oral Oral Oral Oral  SpO2: 99% 97% 96% 100%  Weight:  71.3 kg (157 lb 3 oz)    Height:       Weight change: -1.9 kg (-4 lb 3 oz)  Physical Exam: General: Alert chronically ill AAM,  NAD. Heart: RRR; no murmur Lungs: CTA anteriorly Abdomen: Stapled incision in L groin, drain remains in place. Extremities: Darkened B LE, L toes with gangrenous changes Dialysis Access: AVF pos bruit    Dialysis Orders: [facility] 3h 72kg 2/2 bath AVF Hep 1400 - Calcitriol 1.4mcg PO q HD - Venofer 100mg  x 10 ordered (4 given so far) for tsat 20% on 7/26. - Was getting sensipar 180mg  PO q HD (held on 7/28 d/t low Ca)  Problems/Plan: 1. L groin lymphocele: s/p excision of lymphocele 8/8, drain in place. Culture from OR grew pseudomonas and pt is now on [place]. WBC is down 20 K > 11k since admission. Per VVS.  2. L foot dry gangrene: VVS following.  Now has pus draining out of wounds from prior LLE bypass surgery done in June ( LLE fem- peroneal bypass using saph vein harvest) 3. ESRD: Continue TTS HD.  4. BP/volume: Chronic hypotension on midodrine. Close to dry wt, euvolemic.   5. Anemia: Hgb 12.4. No ESA for now. IV iron not continued inpatient d/t infection. 6. Metabolic bone disease: Ca ok, Phos now down to 4.2 in hosp Ca core 9.5 on Phoslo/Renvela as binders, will decrease to only Phoslo/  reduced Sensipar 180 ->90mg  PO TTS. Restarted PO calcitriol. 7. Nutrition: Alb low (2.3), continue Nepro. 8. HFrEF (EF 15-20% with AICD): asymptomatic, per primary.     Vinson Moselle MD [facility] pager  [phone]   03/11/2017, 12:56 PM    Labs: Basic Metabolic Panel:  Recent Labs Lab 03/06/17 0408 03/07/17 0636 03/08/17 1730 03/10/17 0728  NA 140 136 135 135  K 4.2 3.8 4.1 4.4  CL 94* 92* 96* 95*  CO2 29 30 25 25   GLUCOSE 113* 90 96 99  BUN 36* 28* 47* 41*  CREATININE 8.48* 6.30* 8.15* 6.51*  CALCIUM 8.6* 8.2* 7.9* 8.6*  PHOS 5.9*  --  4.2 4.1   Liver Function Tests:  Recent Labs Lab 03/05/17 1741 03/06/17 0408 03/08/17 1730 03/10/17 0728  AST 20 16  --   --   ALT 9* 8*  --   --   ALKPHOS 150* 133*  --   --   BILITOT 0.9 0.8  --   --   PROT 6.9 6.3*  --   --   ALBUMIN 2.8* 2.5* 2.3* 2.3*   CBC:  Recent Labs Lab 03/05/17 1741 03/06/17 0408 03/07/17 0636 03/08/17 1748 03/09/17 0404 03/10/17 0727  WBC 24.3* 20.8* 18.9* 14.2* 12.7* 11.4*  NEUTROABS 22.3*  --   --   --   --   --   HGB 12.6* 11.9* 11.9* 11.2* 12.4* 12.1*  HCT 40.2 37.9* 38.8* 35.7* 39.5 38.3*  MCV 87.2 86.1 87.6 85.2 86.2 85.9  PLT 243  221 260 304 296 326   Cardiac Enzymes: No results for input(s): CKTOTAL, CKMB, CKMBINDEX, TROPONINI in the last 168 hours. CBG: No results for input(s): GLUCAP in the last 168 hours.  Studies/Results: No results found. Medications: . sodium chloride 250 mL (03/06/17 0238)  . cefTAZidime (FORTAZ)  IV Stopped (03/10/17 1818)   . calcitRIOL  1.25 mcg Oral Once per day on Tue Thu Sat  . calcium acetate  2,001 mg Oral TID WC  . cinacalcet  90 mg Oral Q T,Th,Sat-1800  . enoxaparin (LOVENOX) injection  30 mg Subcutaneous Daily  . feeding supplement (NEPRO CARB STEADY)  237 mL Oral BID BM  . feeding supplement (PRO-STAT SUGAR FREE 64)  30 mL Oral BID  . gabapentin  100 mg Oral BID  . midodrine  10 mg Oral TID WC  . multivitamin  1 tablet Oral Daily  . senna  1 tablet Oral BID  . sodium chloride flush  3 mL Intravenous Q12H

## 2017-03-11 NOTE — Progress Notes (Signed)
  Progress Note    03/11/2017 10:11 AM 4 Days Post-Op  Subjective:  Left thigh pain  Vitals:   03/11/17 0456 03/11/17 1006  BP: 99/60 (!) 109/59  Pulse: 83 79  Resp: 18 18  Temp: 98.7 F (37.1 C) 98.8 F (37.1 C)  SpO2: 96% 100%    Physical Exam: Left femoral incision is cdi, scant drainage in jp that appears fibrinous Left mid-thigh saphenectomy site with punctate area of pururlent drainage, thigh has associated induration. No erythema  CBC    Component Value Date/Time   WBC 11.4 (H) 03/10/2017 0727   RBC 4.46 03/10/2017 0727   HGB 12.1 (L) 03/10/2017 0727   HCT 38.3 (L) 03/10/2017 0727   PLT 326 03/10/2017 0727   MCV 85.9 03/10/2017 0727   MCH 27.1 03/10/2017 0727   MCHC 31.6 03/10/2017 0727   RDW 19.1 (H) 03/10/2017 0727   LYMPHSABS 0.7 03/05/2017 1741   MONOABS 1.2 (H) 03/05/2017 1741   EOSABS 0.0 03/05/2017 1741   BASOSABS 0.0 03/05/2017 1741    BMET    Component Value Date/Time   NA 135 03/10/2017 0728   K 4.4 03/10/2017 0728   CL 95 (L) 03/10/2017 0728   CO2 25 03/10/2017 0728   GLUCOSE 99 03/10/2017 0728   BUN 41 (H) 03/10/2017 0728   CREATININE 6.51 (H) 03/10/2017 0728   CALCIUM 8.6 (L) 03/10/2017 0728   GFRNONAA 8 (L) 03/10/2017 0728   GFRAA 9 (L) 03/10/2017 0728    INR    Component Value Date/Time   INR 1.29 03/07/2017 0636     Intake/Output Summary (Last 24 hours) at 03/11/17 1011 Last data filed at 03/11/17 0020  Gross per 24 hour  Intake              170 ml  Output               25 ml  Net              145 ml   jp drain: 25cc recorded  Assessment:  66 y.o. male is s/p left peroneal bypass with in situ vein. Lymphocele drained now with jp. Purulence from saphenectomy site  Plan: Discussed that he will need to go to the OR to washout his wound given there is purulent drainage. He does not want to return to the OR this morning despite not having had breakfast and he would prefer to discuss with Dr. Arbie Cookey. I have made him npo at  midnight and ordered labs for tomorrow. Plan for likely OR to washout wound tomorrow.    Brandon C. Randie Heinz, MD [facility] Office: [phone] Pager: [phone]  03/11/2017 10:11 AM

## 2017-03-11 NOTE — Progress Notes (Signed)
PT Cancellation Note  Patient Details Name: Dre Forgacs MRN: [id number] DOB: Feb 25, 1951   Cancelled Treatment:    Reason Eval/Treat Not Completed: Patient declined, no reason specified. Spoke with RN and MD and they both informed pt that he has to move with PT before he is able to D/C home. Arrive in pt's room and he is refusing to participate and wants PT to return at 7 PM.  Informed pt that therapy is not usually available at that time. Attempted to get pt to perform short distance mobility to recliner, pt refused. Pt reports he is in pain and wants to just sleep. Reports that he wants his pain medication right before working with PT. Per RN, pt had his pain medication well in advance of therapy and is within the window for management. Pt asked how early PT can come in the AM. Reported that PT is here as early as 8AM. Pt requests PT return in the AM when he has his shoes and some pants to wear. Advised that pt that PT will try to return early AM tomorrow and pt reports that he promises to participate at that time. PT will attempt to check back tomorrow AM or as time allows. If pt continues to refuse therapy and mobilize, PT will have to sign off per department protocol.    Colin Broach PT, DPT  [phone]  03/11/2017, 12:01 PM

## 2017-03-12 ENCOUNTER — Telehealth: Payer: Self-pay | Admitting: Vascular Surgery

## 2017-03-12 LAB — AEROBIC/ANAEROBIC CULTURE (SURGICAL/DEEP WOUND)

## 2017-03-12 LAB — AEROBIC/ANAEROBIC CULTURE W GRAM STAIN (SURGICAL/DEEP WOUND)

## 2017-03-12 MED ORDER — RENA-VITE PO TABS
1.0000 | ORAL_TABLET | Freq: Every day | ORAL | Status: DC
  Administered 2017-03-13 – 2017-03-15 (×3): 1 via ORAL
  Filled 2017-03-12 (×3): qty 1

## 2017-03-12 NOTE — Telephone Encounter (Signed)
Unable to leave VM, no other # good on the acct, mailed letter for appt 8/17

## 2017-03-12 NOTE — Progress Notes (Signed)
PT Cancellation Note  Patient Details Name: Willie Andrade MRN: [id number] DOB: [birth year]   Cancelled Treatment:    Reason Eval/Treat Not Completed: Other (comment)  Discussed Willie Andrade's status with his RN, who informed me he is choosing to decline PT this morning;  Noted the plans for return to surgery, likely today;  Pt will discuss this with Dr. Arbie Cookey;   Can hold PT at this time in anticipation of surgery later today -- will need a reorder for PT postop;  Thank you,  Van Clines, PT  Acute Rehabilitation Services Pager [phone] Office [phone]   Levi Aland 03/12/2017, 11:02 AM

## 2017-03-12 NOTE — Progress Notes (Signed)
S: Co pain Lt groin and foot O:BP 105/76 (BP Location: Left Arm)   Pulse 80   Temp (!) 97.5 F (36.4 C) (Oral)   Resp 18   Ht 6\' 2"  (1.88 m)   Wt 71.3 kg (157 lb 3 oz)   SpO2 99%   BMI 20.18 kg/m   Intake/Output Summary (Last 24 hours) at 03/12/17 0847 Last data filed at 03/12/17 6270  Gross per 24 hour  Intake              540 ml  Output               75 ml  Net              465 ml   Weight change:  JJK:KXFGH and alert CVS: RRR Resp:Clear Abd:+ BS NTND Ext: Lt groin with stapled wound and drain, tender to touch.  Lt foot with dry gangrene.  Surgical wound Lt thigh no pus expressed but drainage on bandage NEURO:CNI, Ox3 no asterixis  . calcitRIOL  1.25 mcg Oral Once per day on Tue Thu Sat  . calcium acetate  2,001 mg Oral TID WC  . cinacalcet  90 mg Oral Q T,Th,Sat-1800  . enoxaparin (LOVENOX) injection  30 mg Subcutaneous Daily  . feeding supplement (NEPRO CARB STEADY)  237 mL Oral BID BM  . feeding supplement (PRO-STAT SUGAR FREE 64)  30 mL Oral BID  . gabapentin  100 mg Oral BID  . midodrine  10 mg Oral TID WC  . multivitamin  1 tablet Oral Daily  . senna  1 tablet Oral BID  . sodium chloride flush  3 mL Intravenous Q12H   No results found. BMET    Component Value Date/Time   NA 135 03/10/2017 0728   K 4.4 03/10/2017 0728   CL 95 (L) 03/10/2017 0728   CO2 25 03/10/2017 0728   GLUCOSE 99 03/10/2017 0728   BUN 41 (H) 03/10/2017 0728   CREATININE 6.51 (H) 03/10/2017 0728   CALCIUM 8.6 (L) 03/10/2017 0728   GFRNONAA 8 (L) 03/10/2017 0728   GFRAA 9 (L) 03/10/2017 0728   CBC    Component Value Date/Time   WBC 11.4 (H) 03/10/2017 0727   RBC 4.46 03/10/2017 0727   HGB 12.1 (L) 03/10/2017 0727   HCT 38.3 (L) 03/10/2017 0727   PLT 326 03/10/2017 0727   MCV 85.9 03/10/2017 0727   MCH 27.1 03/10/2017 0727   MCHC 31.6 03/10/2017 0727   RDW 19.1 (H) 03/10/2017 0727   LYMPHSABS 0.7 03/05/2017 1741   MONOABS 1.2 (H) 03/05/2017 1741   EOSABS 0.0 03/05/2017 1741    BASOSABS 0.0 03/05/2017 1741     Assessment:  1. Lt groin lymphocele  Rare pseudomonas from culture on fortaz 2.  Lt foot dry gangrene 3. Sec HPTH on sensipar/calcitriol 4. Chronic hypotension on midodrine   Plan: 1. Plan OR to debride Lt groin 2. HD tomorrow 3. Cont AB  Sheryl Saintil T

## 2017-03-12 NOTE — Progress Notes (Signed)
Progress note for 8/12  I had interviewed and examined patient on 8/12. I even discussed care with [facility]. I inadvertently made changes to progress note from 8/11 instead of putting in a new progress note for 8/12.   Marcellus Scott, MD, FACP, FHM. [facility] Pager [phone]  If 7PM-7AM, please contact night-coverage [email or web address] Password [email or web address] 03/12/2017, 2:20 PM

## 2017-03-12 NOTE — Progress Notes (Signed)
PROGRESS NOTE   Willie Andrade  RUE:[id number]    DOB: 02-25-1951    DOA: 03/05/2017  PCP: Lucianne Lei, MD   I have briefly reviewed patients previous medical records in [facility].  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 No worsening drainage from his left toes. In the ED, WBC 24K, high lactate and pro-calcitonin, clinical picture consistent with cellulitis, no osteomyelitis. He was seen by home health RN who felt that his wounds were worse and was advised to come to the ED. Empirically started on IV vancomycin. Vascular surgery and nephrology were consulted.   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 Sepsis secondary to lower extremity wound infections/cellulitis: Met sepsis criteria on admission. He was empirically started on IV vancomycin. Cellulitis resolved. Vancomycin discontinued.Blood cultures 2: Negative, final report. 2. Recurrent left groin lymphocele: Status post excision of lymphocele 8/8 and drain in place which is still draining copious amounts. Discussed with Dr. Donnetta Hutching, [facility] on 8/10 who recommended that patient could be discharged with drain in place, home health for management of  drain and outpatient follow-up with him. Subsequently operative culture grew Pseudomonas and patient started on IV Fortaz across HD? 10-14 total days or per vascular surgery. 3. PAD/left foot gangrene/purulent drainage from left leg saphenectomy site: Vascular surgery following. Left leg bypass is patent. No surgery planned for gangrene itself. Patient has declined amputations in the past. Continue wound care per [facility] RN recommendations. I&D of left leg saphenectomy site rescheduled for 03/14/17. No change to IV [place]. 4. ESRD: Nephrology following. On TTS HD. 5. Chronic hypotension: Continue midodrine. Stable. Clinically euvolemic. 6. Anemia: Stable. 7. Secondary hyperthyroidism: Per nephrology. 8. Chronic systolic CHF/EF 14-78 percent/AICD: Compensated. 9. Chronic pain: Judicious use of opioids.   DVT prophylaxis: Lovenox Code Status: Full  Family Communication: None at bedside Disposition: To be determined   Consultants:  Vascular surgery Nephrology   Procedures:  HD Excision of left recurrent lymphocele on 03/07/17  Antimicrobials:  IV vancomycin-discontinued IV Fortaz across dialysis.    Subjective: Ongoing pain of left foot. Seen this morning while he was still NPO for possible procedure and asking why this could not be done on Wednesday so that he could eat. Patient has been consistently refusing PT evaluation.  ROS: No chest pain, dyspnea, palpitations, dizziness or lightheadedness.  Objective:  Vitals:   03/11/17 1006 03/11/17 1815 03/11/17 2205 03/12/17 1000  BP: (!) 109/59 (!) 116/53 105/76 108/63  Pulse: 79 81 80 78  Resp: '18 18 18 18  '$ Temp: 98.8 F (37.1 C) 97.8 F (36.6 C) (!) 97.5 F (36.4 C) 98.1 F (36.7 C)  TempSrc: Oral Oral Oral Oral  SpO2: 100% 98% 99% 100%  Weight:  Height:        Examination:  General exam: Pleasant middle-aged male, moderately built and nourished, lying comfortably supine in bed. Respiratory system: Clear to  auscultation. Respiratory effort normal. Cardiovascular system: S1 & S2 heard, RRR. No JVD, murmurs, rubs, gallops or clicks. No pedal edema. Gastrointestinal system: Abdomen is nondistended, soft and nontender. No organomegaly or masses felt. Normal bowel sounds heard. Central nervous system: Alert and oriented. No focal neurological deficits. Extremities: Symmetric 5 x 5 power. Chronic dry gangrene changes of left toes and foot. Left leg saphenectomy scar currently without acute findings or drainage. Left groin surgical site staples intact without acute findings and drain in place draining some creamy colored fluid. Dark discoloration of right foot. Skin: No rashes, lesions or ulcers Psychiatry: Judgement and insight impaired. Mood & affect appropriate.     Data Reviewed: I have personally reviewed following labs and imaging studies  CBC:  Recent Labs Lab 03/05/17 1741 03/06/17 0408 03/07/17 0636 03/08/17 1748 03/09/17 0404 03/10/17 0727  WBC 24.3* 20.8* 18.9* 14.2* 12.7* 11.4*  NEUTROABS 22.3*  --   --   --   --   --   HGB 12.6* 11.9* 11.9* 11.2* 12.4* 12.1*  HCT 40.2 37.9* 38.8* 35.7* 39.5 38.3*  MCV 87.2 86.1 87.6 85.2 86.2 85.9  PLT 243 221 260 304 296 182   Basic Metabolic Panel:  Recent Labs Lab 03/05/17 1741 03/06/17 0408 03/07/17 0636 03/08/17 1730 03/10/17 0728  NA 137 140 136 135 135  K 4.2 4.2 3.8 4.1 4.4  CL 95* 94* 92* 96* 95*  CO2 '26 29 30 25 25  '$ GLUCOSE 111* 113* 90 96 99  BUN 35* 36* 28* 47* 41*  CREATININE 8.00* 8.48* 6.30* 8.15* 6.51*  CALCIUM 8.8* 8.6* 8.2* 7.9* 8.6*  MG  --  1.9  --   --   --   PHOS  --  5.9*  --  4.2 4.1   Liver Function Tests:  Recent Labs Lab 03/05/17 1741 03/06/17 0408 03/08/17 1730 03/10/17 0728  AST 20 16  --   --   ALT 9* 8*  --   --   ALKPHOS 150* 133*  --   --   BILITOT 0.9 0.8  --   --   PROT 6.9 6.3*  --   --   ALBUMIN 2.8* 2.5* 2.3* 2.3*   Coagulation Profile:  Recent Labs Lab 03/05/17 1741  03/07/17 0636  INR 1.36 1.29   Cardiac Enzymes: No results for input(s): CKTOTAL, CKMB, CKMBINDEX, TROPONINI in the last 168 hours. HbA1C: No results for input(s): HGBA1C in the last 72 hours. CBG: No results for input(s): GLUCAP in the last 168 hours.  Recent Results (from the past 240 hour(s))  Culture, blood (Routine x 2)     Status: None   Collection Time: 03/05/17  5:40 PM  Result Value Ref Range Status   Specimen Description BLOOD RIGHT HAND  Final   Special Requests IN PEDIATRIC BOTTLE Blood Culture adequate volume  Final   Culture NO GROWTH 5 DAYS  Final   Report Status 03/10/2017 FINAL  Final  Culture, blood (Routine x 2)     Status: None   Collection Time: 03/05/17  7:29 PM  Result Value Ref Range Status   Specimen Description BLOOD BLOOD RIGHT FOREARM  Final   Special Requests IN PEDIATRIC BOTTLE Blood Culture adequate volume  Final   Culture NO GROWTH 5 DAYS  Final   Report Status 03/10/2017 FINAL  Final  MRSA PCR Screening     Status: None   Collection Time: 03/06/17  2:18 AM  Result Value Ref Range Status   MRSA by PCR NEGATIVE NEGATIVE Final    Comment:        The GeneXpert MRSA Assay (FDA approved for NASAL specimens only), is one component of a comprehensive MRSA colonization surveillance program. It is not intended to diagnose MRSA infection nor to guide or monitor treatment for MRSA infections.   Surgical pcr screen     Status: None   Collection Time: 03/07/17  5:59 AM  Result Value Ref Range Status   MRSA, PCR NEGATIVE NEGATIVE Final   Staphylococcus aureus NEGATIVE NEGATIVE Final    Comment:        The Xpert SA Assay (FDA approved for NASAL specimens in patients over 63 years of age), is one component of a comprehensive surveillance program.  Test performance has been validated by [facility] for patients greater than or equal to 76 year old. It is not intended to diagnose infection nor to guide or monitor treatment.   Aerobic/Anaerobic  Culture (surgical/deep wound)     Status: None (Preliminary result)   Collection Time: 03/07/17  9:15 AM  Result Value Ref Range Status   Specimen Description GROIN  Final   Special Requests LEFT  Final   Gram Stain   Final    FEW WBC PRESENT,BOTH PMN AND MONONUCLEAR NO ORGANISMS SEEN    Culture RARE PSEUDOMONAS AERUGINOSA  Final   Report Status PENDING  Incomplete   Organism ID, Bacteria PSEUDOMONAS AERUGINOSA  Final      Susceptibility   Pseudomonas aeruginosa - MIC*    CEFTAZIDIME 4 SENSITIVE Sensitive     CIPROFLOXACIN <=0.25 SENSITIVE Sensitive     GENTAMICIN <=1 SENSITIVE Sensitive     IMIPENEM 2 SENSITIVE Sensitive     PIP/TAZO 8 SENSITIVE Sensitive     CEFEPIME 2 SENSITIVE Sensitive     * RARE PSEUDOMONAS AERUGINOSA         Radiology Studies: No results found.      Scheduled Meds: . calcitRIOL  1.25 mcg Oral Once per day on Tue Thu Sat  . calcium acetate  2,001 mg Oral TID WC  . cinacalcet  90 mg Oral Q T,Th,Sat-1800  . enoxaparin (LOVENOX) injection  30 mg Subcutaneous Daily  . feeding supplement (NEPRO CARB STEADY)  237 mL Oral BID BM  . feeding supplement (PRO-STAT SUGAR FREE 64)  30 mL Oral BID  . gabapentin  100 mg Oral BID  . midodrine  10 mg Oral TID WC  . [START ON 03/13/2017] multivitamin  1 tablet Oral QHS  . senna  1 tablet Oral BID  . sodium chloride flush  3 mL Intravenous Q12H   Continuous Infusions: . sodium chloride 250 mL (03/06/17 0238)  . cefTAZidime (FORTAZ)  IV Stopped (03/11/17 1905)     LOS: 6 days     Vernal Rutan, MD, FACP, FHM. [facility] Pager [phone]  If 7PM-7AM, please contact night-coverage [email or web address] Password [email or web address] 03/12/2017, 1:58 PM

## 2017-03-12 NOTE — Progress Notes (Signed)
Patient ID: Willie Andrade, male   DOB: [birth year], 66 y.o.   MRN: [id number] Patient continues to have some drainage from Jackson-Pratt drain but this is less. Fluid is somewhat cloudy and there is still some slight purulent drainage at the saphenectomy site in the mid thigh. Suspect this is old liquefied hematoma. Have recommended exploration in the operating room since he has tenderness associated with this. His currently refusing surgery today but is willing to consider Wednesday. States that he wants to have all of his doctors on the same page. We will schedule him for return to the operating room on Wednesday for I&D of the saphenectomy site and possible re-expiration of his groin incision as well. Continues to have healing of his foot. He reports that he is having less pain and certainly has less swelling as well.

## 2017-03-12 NOTE — Progress Notes (Signed)
Physical Therapy Note   Met with Willie Andrade to discuss plans for PT tomorrow;   Will attempt to see him first; if he is in HD, will make an effort to see him after HD;  Will plan to call ahead to coordinate with pain meds;   [name], [place]  Acute Rehabilitation Services Pager [phone] Office [phone]

## 2017-03-12 NOTE — Telephone Encounter (Signed)
-----   Message from Sharee Pimple, RN sent at 03/09/2017  5:30 PM EDT ----- Regarding: appt next week staple and drain removal    ----- Message ----- From: [name], MD Sent: 03/09/2017   5:02 PM To: [facility] Clinical Pool, [facility] Charge Pool  Will be discharged over the weekend. Needs office visit at the end of next week for staple removal in his left groin. Also will remit need removal of his Jackson-Pratt drain at that time. Can see Rosalita Chessman for this.

## 2017-03-13 LAB — RENAL FUNCTION PANEL
ALBUMIN: 2.3 g/dL — AB (ref 3.5–5.0)
ANION GAP: 16 — AB (ref 5–15)
BUN: 49 mg/dL — AB (ref 6–20)
CALCIUM: 8.5 mg/dL — AB (ref 8.9–10.3)
CO2: 22 mmol/L (ref 22–32)
Chloride: 94 mmol/L — ABNORMAL LOW (ref 101–111)
Creatinine, Ser: 7.37 mg/dL — ABNORMAL HIGH (ref 0.61–1.24)
GFR calc Af Amer: 8 mL/min — ABNORMAL LOW (ref >60)
GFR, EST NON AFRICAN AMERICAN: 7 mL/min — AB (ref >60)
GLUCOSE: 112 mg/dL — AB (ref 65–99)
PHOSPHORUS: 5 mg/dL — AB (ref 2.5–4.6)
POTASSIUM: 4.5 mmol/L (ref 3.5–5.1)
SODIUM: 132 mmol/L — AB (ref 135–145)

## 2017-03-13 LAB — CBC
HEMATOCRIT: 39.6 % (ref 39.0–52.0)
HEMOGLOBIN: 12.5 g/dL — AB (ref 13.0–17.0)
MCH: 27.1 pg (ref 26.0–34.0)
MCHC: 31.6 g/dL (ref 30.0–36.0)
MCV: 85.7 fL (ref 78.0–100.0)
Platelets: 300 10*3/uL (ref 150–400)
RBC: 4.62 MIL/uL (ref 4.22–5.81)
RDW: 19.3 % — ABNORMAL HIGH (ref 11.5–15.5)
WBC: 11.2 10*3/uL — AB (ref 4.0–10.5)

## 2017-03-13 MED ORDER — MIDODRINE HCL 5 MG PO TABS
ORAL_TABLET | ORAL | Status: AC
  Administered 2017-03-13: 10 mg via ORAL
  Filled 2017-03-13: qty 2

## 2017-03-13 MED ORDER — CALCITRIOL 0.5 MCG PO CAPS
ORAL_CAPSULE | ORAL | Status: AC
  Administered 2017-03-13: 1.25 ug
  Filled 2017-03-13: qty 1

## 2017-03-13 MED ORDER — CALCITRIOL 0.5 MCG PO CAPS
ORAL_CAPSULE | ORAL | Status: AC
  Filled 2017-03-13: qty 1

## 2017-03-13 MED ORDER — CALCITRIOL 0.25 MCG PO CAPS
ORAL_CAPSULE | ORAL | Status: AC
  Filled 2017-03-13: qty 1

## 2017-03-13 MED ORDER — OXYCODONE-ACETAMINOPHEN 5-325 MG PO TABS
1.0000 | ORAL_TABLET | Freq: Four times a day (QID) | ORAL | Status: DC | PRN
  Administered 2017-03-13: 2 via ORAL
  Administered 2017-03-14: 1 via ORAL
  Administered 2017-03-14 – 2017-03-15 (×7): 2 via ORAL
  Filled 2017-03-13 (×8): qty 2

## 2017-03-13 MED ORDER — HYDROMORPHONE HCL 1 MG/ML IJ SOLN
0.5000 mg | Freq: Three times a day (TID) | INTRAMUSCULAR | Status: DC | PRN
  Administered 2017-03-13 – 2017-03-15 (×4): 0.5 mg via INTRAVENOUS
  Filled 2017-03-13 (×5): qty 0.5

## 2017-03-13 MED ORDER — OXYCODONE-ACETAMINOPHEN 5-325 MG PO TABS
ORAL_TABLET | ORAL | Status: AC
  Administered 2017-03-13: 2 via ORAL
  Filled 2017-03-13: qty 2

## 2017-03-13 NOTE — Progress Notes (Signed)
PT Cancellation  And Discharge Note  Patient Details Name: Willie Andrade MRN: [id number] DOB: 1951/03/29   Cancelled Treatment:    Reason Eval/Treat Not Completed: Other (comment)    Attempted to see Willie Andrade after he received his pain meds;   He refused PT Eval, stating he needed his pain meds;   Verified with RN he did receive his pain meds prior to my arrival;  Pt still declining;   Per department guidelines, will DC acute PT at this time;   Will need a reorder for PT when pt is willing to participate;  Thank you,  Van Clines, PT  Acute Rehabilitation Services Pager [phone] Office [phone]    Willie Andrade 03/13/2017, 4:18 PM

## 2017-03-13 NOTE — Progress Notes (Signed)
PT Cancellation Note  Patient Details Name: Adis Granger MRN: [id number] DOB: 1950/11/19   Cancelled Treatment:    Reason Eval/Treat Not Completed: Patient at procedure or test/unavailable   Currently in HD;  Will follow up later today as time allows, with plan to coordinate for premedication for pain; Otherwise, will follow up for PT tomorrow;   Thank you,  Van Clines, PT  Acute Rehabilitation Services Pager [phone] Office [phone]     Levi Aland 03/13/2017, 8:13 AM

## 2017-03-13 NOTE — Progress Notes (Signed)
Spoke to pt to let him know we are planning for I&D tomorrow with possible wound vac placement.  Pt understands not eat or drink anything after MN.  He would like to speak with Dr. Parke Simmers prior to surgery.  Orders placed.  Doreatha Massed, [facility] 03/13/2017 8:34 AM

## 2017-03-13 NOTE — Progress Notes (Signed)
PROGRESS NOTE   Willie Andrade  KKX:[id number]    DOB: 1950/10/11    DOA: 03/05/2017  PCP: Lucianne Lei, MD   I have briefly reviewed patients previous medical records in [facility].  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 No worsening drainage from his left toes. In the ED, WBC 24K, high lactate and pro-calcitonin, clinical picture consistent with cellulitis, no osteomyelitis. He was seen by home health RN who felt that his wounds were worse and was advised to come to the ED. Empirically started on IV vancomycin. Vascular surgery and nephrology were consulted.   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 Sepsis secondary to lower extremity wound infections/cellulitis: Met sepsis criteria on admission. He was empirically started on IV vancomycin. Cellulitis resolved. Vancomycin discontinued.Blood cultures 2: Negative, final report. 2. Recurrent left groin lymphocele: Status post excision of lymphocele 8/8 and drain in place which is still draining copious amounts. Discussed with Dr. Donnetta Hutching, VVS on 8/10 who recommended that patient could be discharged with drain in place, home health for management of  drain and outpatient follow-up with him. Subsequently operative culture grew Pseudomonas and patient started on IV Fortaz across HD? 10-14 total days or per vascular surgery. 3. PAD/left foot gangrene/purulent drainage from left leg saphenectomy site: Vascular surgery following. Left leg bypass is patent. No surgery planned for gangrene itself. Patient has declined amputations in the past. Continue wound care per [facility] RN recommendations. I&D of left leg saphenectomy site rescheduled for 03/14/17. No change to IV [place]. Vascular surgery follow-up appreciated. 4. ESRD: Nephrology following. On TTS HD. Seen at hemodialysis this morning. 5. Chronic hypotension: Continue midodrine. Stable. Clinically euvolemic. 6. Anemia: Stable. 7. Secondary hyperthyroidism: Per nephrology. 8. Chronic systolic CHF/EF 16-96 percent/AICD: Compensated. 9. Chronic pain: Judicious use of opioids.   DVT prophylaxis: Lovenox Code Status: Full  Family Communication: None at bedside Disposition: To be determined. Patient refuses to work with PT who have come by to see him daily for the last couple of days.   Consultants:  Vascular surgery Nephrology   Procedures:  HD Excision of left recurrent lymphocele on 03/07/17  Antimicrobials:  IV vancomycin-discontinued IV Fortaz across dialysis.    Subjective: Seen this morning at hemodialysis. Pain in left toes. Irritable and grumpy, chronic.  ROS: No chest pain, dyspnea, palpitations, dizziness or lightheadedness.  Objective:  Vitals:   03/13/17 0900 03/13/17 0930 03/13/17 1000 03/13/17 1030  BP: 112/64 110/68 108/68 108/68  Pulse: 100 70 71 67  Resp:      Temp:      TempSrc:      SpO2:      Weight:      Height:      Respiratory rate 18  per minute, temperature 97.67F and oxygen saturation at 100%.  Examination:  General exam: Pleasant middle-aged male, moderately built and nourished, lying comfortably supine in bed. Respiratory system: Clear to  auscultation. Respiratory effort normal. Cardiovascular system: S1 & S2 heard, RRR. No JVD, murmurs, rubs, gallops or clicks. No pedal edema. Gastrointestinal system: Abdomen is nondistended, soft and nontender. No organomegaly or masses felt. Normal bowel sounds heard. Central nervous system: Alert and oriented. No focal neurological deficits. Extremities: Symmetric 5 x 5 power. Chronic dry gangrene changes of left toes and foot. Left leg saphenectomy scar currently without acute findings or drainage currently. Left groin surgical site staples intact without acute findings and drain in place draining some creamy colored fluid. Dark discoloration of right foot. Skin: No rashes, lesions or ulcers Psychiatry: Judgement and insight impaired. Mood & affect appropriate.     Data Reviewed: I have personally reviewed following labs and imaging studies  CBC:  Recent Labs Lab 03/07/17 0636 03/08/17 1748 03/09/17 0404 03/10/17 0727 03/13/17 0840  WBC 18.9* 14.2* 12.7* 11.4* 11.2*  HGB 11.9* 11.2* 12.4* 12.1* 12.5*  HCT 38.8* 35.7* 39.5 38.3* 39.6  MCV 87.6 85.2 86.2 85.9 85.7  PLT 260 304 296 326 629   Basic Metabolic Panel:  Recent Labs Lab 03/07/17 0636 03/08/17 1730 03/10/17 0728 03/13/17 0840  NA 136 135 135 132*  K 3.8 4.1 4.4 4.5  CL 92* 96* 95* 94*  CO2 '30 25 25 22  '$ GLUCOSE 90 96 99 112*  BUN 28* 47* 41* 49*  CREATININE 6.30* 8.15* 6.51* 7.37*  CALCIUM 8.2* 7.9* 8.6* 8.5*  PHOS  --  4.2 4.1 5.0*   Liver Function Tests:  Recent Labs Lab 03/08/17 1730 03/10/17 0728 03/13/17 0840  ALBUMIN 2.3* 2.3* 2.3*   Coagulation Profile:  Recent Labs Lab 03/07/17 0636  INR 1.29   Cardiac Enzymes: No results for input(s): CKTOTAL, CKMB, CKMBINDEX, TROPONINI in the last 168 hours. HbA1C: No results for input(s): HGBA1C in the last 72 hours. CBG: No results for input(s): GLUCAP in the last 168 hours.  Recent Results (from the past 240 hour(s))  Culture, blood (Routine  x 2)     Status: None   Collection Time: 03/05/17  5:40 PM  Result Value Ref Range Status   Specimen Description BLOOD RIGHT HAND  Final   Special Requests IN PEDIATRIC BOTTLE Blood Culture adequate volume  Final   Culture NO GROWTH 5 DAYS  Final   Report Status 03/10/2017 FINAL  Final  Culture, blood (Routine x 2)     Status: None   Collection Time: 03/05/17  7:29 PM  Result Value Ref Range Status   Specimen Description BLOOD BLOOD RIGHT FOREARM  Final   Special Requests IN PEDIATRIC BOTTLE Blood Culture adequate volume  Final   Culture NO GROWTH 5 DAYS  Final   Report Status 03/10/2017 FINAL  Final  MRSA PCR Screening     Status: None   Collection Time: 03/06/17  2:18 AM  Result Value Ref Range Status   MRSA by PCR NEGATIVE NEGATIVE Final    Comment:        The GeneXpert MRSA Assay (FDA approved for NASAL specimens only), is one component of a comprehensive MRSA colonization surveillance program. It is not intended to diagnose MRSA infection nor to guide or monitor treatment for MRSA infections.   Surgical pcr screen     Status: None   Collection Time: 03/07/17  5:59 AM  Result Value Ref Range Status  MRSA, PCR NEGATIVE NEGATIVE Final   Staphylococcus aureus NEGATIVE NEGATIVE Final    Comment:        The Xpert SA Assay (FDA approved for NASAL specimens in patients over 69 years of age), is one component of a comprehensive surveillance program.  Test performance has been validated by [facility] for patients greater than or equal to 37 year old. It is not intended to diagnose infection nor to guide or monitor treatment.   Aerobic/Anaerobic Culture (surgical/deep wound)     Status: None   Collection Time: 03/07/17  9:15 AM  Result Value Ref Range Status   Specimen Description GROIN  Final   Special Requests LEFT  Final   Gram Stain   Final    FEW WBC PRESENT,BOTH PMN AND MONONUCLEAR NO ORGANISMS SEEN    Culture   Final    RARE PSEUDOMONAS AERUGINOSA NO  ANAEROBES ISOLATED    Report Status 03/12/2017 FINAL  Final   Organism ID, Bacteria PSEUDOMONAS AERUGINOSA  Final      Susceptibility   Pseudomonas aeruginosa - MIC*    CEFTAZIDIME 4 SENSITIVE Sensitive     CIPROFLOXACIN <=0.25 SENSITIVE Sensitive     GENTAMICIN <=1 SENSITIVE Sensitive     IMIPENEM 2 SENSITIVE Sensitive     PIP/TAZO 8 SENSITIVE Sensitive     CEFEPIME 2 SENSITIVE Sensitive     * RARE PSEUDOMONAS AERUGINOSA         Radiology Studies: No results found.      Scheduled Meds: . calcitRIOL  1.25 mcg Oral Once per day on Tue Thu Sat  . calcium acetate  2,001 mg Oral TID WC  . cinacalcet  90 mg Oral Q T,Th,Sat-1800  . enoxaparin (LOVENOX) injection  30 mg Subcutaneous Daily  . feeding supplement (NEPRO CARB STEADY)  237 mL Oral BID BM  . feeding supplement (PRO-STAT SUGAR FREE 64)  30 mL Oral BID  . gabapentin  100 mg Oral BID  . midodrine  10 mg Oral TID WC  . multivitamin  1 tablet Oral QHS  . senna  1 tablet Oral BID  . sodium chloride flush  3 mL Intravenous Q12H   Continuous Infusions: . sodium chloride 250 mL (03/06/17 0238)  . cefTAZidime (FORTAZ)  IV 1 g (03/12/17 1726)     LOS: 7 days     Anisa Leanos, MD, FACP, FHM. [facility] Pager [phone]  If 7PM-7AM, please contact night-coverage [email or web address] Password [facility] 03/13/2017, 11:25 AM

## 2017-03-13 NOTE — Procedures (Signed)
Pt seen on HD.  Ap 200 Vp 230.  BFR 400.  SBP 100.  Tolerating HD well so far.

## 2017-03-13 NOTE — Progress Notes (Signed)
Nutrition Follow-up  DOCUMENTATION CODES:   Severe malnutrition in context of chronic illness  INTERVENTION:   -Continue Nepro BID  -Continue Pro-Stat BID   NUTRITION DIAGNOSIS:   Malnutrition (Severe) related to chronic illness (ESRD on HD, CHF) as evidenced by severe depletion of muscle mass, severe depletion of body fat.  Being addressed via supplements  GOAL:   Patient will meet greater than or equal to 90% of their needs  Progressing  MONITOR:   PO intake, Supplement acceptance, Labs, Weight trends  REASON FOR ASSESSMENT:   Consult Assessment of nutrition requirement/status  ASSESSMENT:    66 yo male admitted with sepsis likely due to chronic wound infection; pt with LLE and R foot cellulitis/dry gangrene of L toes. Pt with ESRD on HD, PVD,gangeren on left foot, CHF   8/9 Excision of left groin lymph nodes (pt with recurrent left groin lymphocele)  Noted plan for return to OR tomorrow for I&D, last HD received today with 1.5 L removed. Pt is anuric  Pt did not touch his lunch tray today and reports his missed breakfast due to HD. PO intake is erratic however because at times pt eating 100% of meals. Pt reports he is drinking the Nepro and taking the Pro-Stat  EDW 72 kg, pt currently under EDW 70.2 kg  Labs: sodium 132, phosphorus 5.0 (acceptable for HD), potassium wdl Meds: calcitriol, sensipar, Rena-Vit  Diet Order:  Diet renal with fluid restriction Fluid restriction: 1200 mL Fluid; Room service appropriate? Yes; Fluid consistency: Thin Diet NPO time specified Except for: Sips with Meds  Skin:  Wound (see comment) (gangerene on toes, cellulitis)  Last BM:  8/14  Height:   Ht Readings from Last 1 Encounters:  03/06/17 6\' 2"  (1.88 m)    Weight:   Wt Readings from Last 1 Encounters:  03/13/17 154 lb 12.2 oz (70.2 kg)   BMI:  Body mass index is 19.87 kg/m.  Estimated Nutritional Needs:   Kcal:  2190-2555 kcals  Protein:  110-128 g  Fluid:   1000 mL plus UOP  EDUCATION NEEDS:   Education needs addressed  Romelle Starcher MS, RD, LDN [phone] Pager  [phone] Weekend/On-Call Pager

## 2017-03-14 ENCOUNTER — Inpatient Hospital Stay (HOSPITAL_COMMUNITY): Payer: Medicare Other | Admitting: Certified Registered"

## 2017-03-14 ENCOUNTER — Encounter (HOSPITAL_COMMUNITY)
Admission: EM | Disposition: A | Discharge status: Home / Self Care | Payer: Self-pay | Source: Home / Self Care | Attending: Internal Medicine

## 2017-03-14 ENCOUNTER — Encounter (HOSPITAL_COMMUNITY): Payer: Self-pay | Admitting: *Deleted

## 2017-03-14 DIAGNOSIS — I9789 Other postprocedural complications and disorders of the circulatory system, not elsewhere classified: Secondary | ICD-10-CM

## 2017-03-14 HISTORY — PX: I & D EXTREMITY: SHX5045

## 2017-03-14 LAB — CBC
HCT: 38.3 % — ABNORMAL LOW (ref 39.0–52.0)
HEMATOCRIT: 39.6 % (ref 39.0–52.0)
HEMOGLOBIN: 12.2 g/dL — AB (ref 13.0–17.0)
Hemoglobin: 11.6 g/dL — ABNORMAL LOW (ref 13.0–17.0)
MCH: 26.8 pg (ref 26.0–34.0)
MCH: 26.7 pg (ref 26.0–34.0)
MCHC: 30.8 g/dL (ref 30.0–36.0)
MCHC: 30.3 g/dL (ref 30.0–36.0)
MCV: 86.8 fL (ref 78.0–100.0)
MCV: 88.2 fL (ref 78.0–100.0)
Platelets: 223 10*3/uL (ref 150–400)
Platelets: 205 K/uL (ref 150–400)
RBC: 4.56 MIL/uL (ref 4.22–5.81)
RBC: 4.34 MIL/uL (ref 4.22–5.81)
RDW: 19.4 % — AB (ref 11.5–15.5)
RDW: 19.8 % — ABNORMAL HIGH (ref 11.5–15.5)
WBC: 12.1 10*3/uL — ABNORMAL HIGH (ref 4.0–10.5)
WBC: 13.1 K/uL — ABNORMAL HIGH (ref 4.0–10.5)

## 2017-03-14 LAB — BASIC METABOLIC PANEL
ANION GAP: 12 (ref 5–15)
BUN: 32 mg/dL — AB (ref 6–20)
CO2: 28 mmol/L (ref 22–32)
Calcium: 8.7 mg/dL — ABNORMAL LOW (ref 8.9–10.3)
Chloride: 96 mmol/L — ABNORMAL LOW (ref 101–111)
Creatinine, Ser: 6.08 mg/dL — ABNORMAL HIGH (ref 0.61–1.24)
GFR calc Af Amer: 10 mL/min — ABNORMAL LOW (ref >60)
GFR calc non Af Amer: 9 mL/min — ABNORMAL LOW (ref >60)
GLUCOSE: 105 mg/dL — AB (ref 65–99)
POTASSIUM: 4 mmol/L (ref 3.5–5.1)
Sodium: 136 mmol/L (ref 135–145)

## 2017-03-14 LAB — PROTIME-INR
INR: 1.34
PROTHROMBIN TIME: 16.7 s — AB (ref 11.4–15.2)

## 2017-03-14 LAB — RENAL FUNCTION PANEL
Albumin: 2.4 g/dL — ABNORMAL LOW (ref 3.5–5.0)
Anion gap: 15 (ref 5–15)
BUN: 36 mg/dL — ABNORMAL HIGH (ref 6–20)
CO2: 25 mmol/L (ref 22–32)
Calcium: 8.5 mg/dL — ABNORMAL LOW (ref 8.9–10.3)
Chloride: 98 mmol/L — ABNORMAL LOW (ref 101–111)
Creatinine, Ser: 6.39 mg/dL — ABNORMAL HIGH (ref 0.61–1.24)
GFR calc Af Amer: 9 mL/min — ABNORMAL LOW (ref >60)
GFR calc non Af Amer: 8 mL/min — ABNORMAL LOW (ref >60)
Glucose, Bld: 110 mg/dL — ABNORMAL HIGH (ref 65–99)
Phosphorus: 4.5 mg/dL (ref 2.5–4.6)
Potassium: 4.4 mmol/L (ref 3.5–5.1)
Sodium: 138 mmol/L (ref 135–145)

## 2017-03-14 SURGERY — IRRIGATION AND DEBRIDEMENT EXTREMITY
Anesthesia: General | Laterality: Left

## 2017-03-14 MED ORDER — SODIUM CHLORIDE 0.9 % IV SOLN: 100.0000 mL | INTRAVENOUS | Status: DC | PRN

## 2017-03-14 MED ORDER — PROPOFOL 10 MG/ML IV BOLUS
INTRAVENOUS | Status: DC | PRN
  Administered 2017-03-14: 100 mg via INTRAVENOUS

## 2017-03-14 MED ORDER — LIDOCAINE-PRILOCAINE 2.5-2.5 % EX CREA
1.0000 | TOPICAL_CREAM | CUTANEOUS | Status: DC | PRN
Start: 2017-03-14 — End: 2017-03-15

## 2017-03-14 MED ORDER — MIDAZOLAM HCL 5 MG/5ML IJ SOLN
INTRAMUSCULAR | Status: DC | PRN
  Administered 2017-03-14: 2 mg via INTRAVENOUS

## 2017-03-14 MED ORDER — EPHEDRINE SULFATE 50 MG/ML IJ SOLN
INTRAMUSCULAR | Status: DC | PRN
  Administered 2017-03-14: 25 mg via INTRAVENOUS
  Administered 2017-03-14: 15 mg via INTRAVENOUS
  Administered 2017-03-14: 5 mg via INTRAVENOUS

## 2017-03-14 MED ORDER — LIDOCAINE HCL (PF) 1 % IJ SOLN: 5.0000 mL | INTRAMUSCULAR | Status: DC | PRN

## 2017-03-14 MED ORDER — PROMETHAZINE HCL 25 MG/ML IJ SOLN: 6.2500 mg | INTRAMUSCULAR | Status: DC | PRN

## 2017-03-14 MED ORDER — LIDOCAINE 2% (20 MG/ML) 5 ML SYRINGE
INTRAMUSCULAR | Status: AC
  Filled 2017-03-14: qty 5

## 2017-03-14 MED ORDER — MIDAZOLAM HCL 2 MG/2ML IJ SOLN
INTRAMUSCULAR | Status: AC
  Filled 2017-03-14: qty 2

## 2017-03-14 MED ORDER — ALTEPLASE 2 MG IJ SOLR: 2.0000 mg | Freq: Once | INTRAMUSCULAR | Status: DC | PRN

## 2017-03-14 MED ORDER — PENTAFLUOROPROP-TETRAFLUOROETH EX AERO: 1.0000 "application " | INHALATION_SPRAY | CUTANEOUS | Status: DC | PRN

## 2017-03-14 MED ORDER — 0.9 % SODIUM CHLORIDE (POUR BTL) OPTIME
TOPICAL | Status: DC | PRN
  Administered 2017-03-14: 1000 mL

## 2017-03-14 MED ORDER — FENTANYL CITRATE (PF) 250 MCG/5ML IJ SOLN
INTRAMUSCULAR | Status: AC
  Filled 2017-03-14: qty 5

## 2017-03-14 MED ORDER — HYDROMORPHONE HCL 1 MG/ML IJ SOLN: 0.2500 mg | INTRAMUSCULAR | Status: DC | PRN

## 2017-03-14 MED ORDER — PHENYLEPHRINE HCL 10 MG/ML IJ SOLN
INTRAVENOUS | Status: DC | PRN
  Administered 2017-03-14: 50 ug/min via INTRAVENOUS

## 2017-03-14 MED ORDER — HEPARIN SODIUM (PORCINE) 1000 UNIT/ML DIALYSIS: 1000.0000 [IU] | INTRAMUSCULAR | Status: DC | PRN

## 2017-03-14 MED ORDER — LIDOCAINE HCL (CARDIAC) 20 MG/ML IV SOLN
INTRAVENOUS | Status: DC | PRN
  Administered 2017-03-14: 100 mg via INTRATRACHEAL

## 2017-03-14 MED ORDER — SODIUM CHLORIDE 0.9 % IV SOLN
INTRAVENOUS | Status: DC
  Administered 2017-03-14: 10:00:00 via INTRAVENOUS

## 2017-03-14 MED ORDER — FENTANYL CITRATE (PF) 100 MCG/2ML IJ SOLN
INTRAMUSCULAR | Status: DC | PRN
  Administered 2017-03-14 (×4): 50 ug via INTRAVENOUS

## 2017-03-14 MED ORDER — PHENYLEPHRINE HCL 10 MG/ML IJ SOLN
INTRAMUSCULAR | Status: DC | PRN
  Administered 2017-03-14: 80 ug via INTRAVENOUS

## 2017-03-14 MED ORDER — MEPERIDINE HCL 25 MG/ML IJ SOLN: 6.2500 mg | INTRAMUSCULAR | Status: DC | PRN

## 2017-03-14 MED ORDER — ONDANSETRON HCL 4 MG/2ML IJ SOLN
INTRAMUSCULAR | Status: DC | PRN
  Administered 2017-03-14: 4 mg via INTRAVENOUS

## 2017-03-14 MED ORDER — PROPOFOL 10 MG/ML IV BOLUS
INTRAVENOUS | Status: AC
  Filled 2017-03-14: qty 20

## 2017-03-14 SURGICAL SUPPLY — 34 items
BRUSH SCRUB EZ PLAIN DRY (MISCELLANEOUS) ×3 IMPLANT
CANISTER SUCT 3000ML PPV (MISCELLANEOUS) ×3 IMPLANT
CLIP LIGATING EXTRA MED SLVR (CLIP) IMPLANT
CLIP LIGATING EXTRA SM BLUE (MISCELLANEOUS) IMPLANT
COVER SURGICAL LIGHT HANDLE (MISCELLANEOUS) ×3 IMPLANT
DRAPE U-SHAPE 47X51 STRL (DRAPES) IMPLANT
DRAPE U-SHAPE 76X120 STRL (DRAPES) IMPLANT
ELECT REM PT RETURN 9FT ADLT (ELECTROSURGICAL) ×3
ELECTRODE REM PT RTRN 9FT ADLT (ELECTROSURGICAL) ×1 IMPLANT
GAUZE SPONGE 4X4 12PLY STRL (GAUZE/BANDAGES/DRESSINGS) IMPLANT
GAUZE SPONGE 4X4 12PLY STRL LF (GAUZE/BANDAGES/DRESSINGS) ×6 IMPLANT
GLOVE BIOGEL PI IND STRL 6.5 (GLOVE) ×1 IMPLANT
GLOVE BIOGEL PI INDICATOR 6.5 (GLOVE) ×2
GLOVE ECLIPSE 7.0 STRL STRAW (GLOVE) ×3 IMPLANT
GLOVE INDICATOR 7.5 STRL GRN (GLOVE) ×3 IMPLANT
GLOVE SS BIOGEL STRL SZ 7.5 (GLOVE) ×1 IMPLANT
GLOVE SUPERSENSE BIOGEL SZ 7.5 (GLOVE) ×2
GOWN STRL REUS W/ TWL LRG LVL3 (GOWN DISPOSABLE) ×3 IMPLANT
GOWN STRL REUS W/TWL LRG LVL3 (GOWN DISPOSABLE) ×6
KIT BASIN OR (CUSTOM PROCEDURE TRAY) ×3 IMPLANT
KIT REMOVER STAPLE SKIN (MISCELLANEOUS) ×3 IMPLANT
KIT ROOM TURNOVER OR (KITS) ×3 IMPLANT
NS IRRIG 1000ML POUR BTL (IV SOLUTION) ×3 IMPLANT
PACK GENERAL/GYN (CUSTOM PROCEDURE TRAY) ×3 IMPLANT
PACK UNIVERSAL I (CUSTOM PROCEDURE TRAY) IMPLANT
PAD ARMBOARD 7.5X6 YLW CONV (MISCELLANEOUS) ×6 IMPLANT
SUT ETHILON 2 0 PSLX (SUTURE) ×6 IMPLANT
SUT ETHILON 3 0 PS 1 (SUTURE) IMPLANT
SUT VIC AB 2-0 CTX 36 (SUTURE) IMPLANT
SUT VIC AB 3-0 SH 27 (SUTURE)
SUT VIC AB 3-0 SH 27X BRD (SUTURE) IMPLANT
SWAB COLLECTION DEVICE MRSA (MISCELLANEOUS) ×3 IMPLANT
SWAB CULTURE ESWAB REG 1ML (MISCELLANEOUS) ×3 IMPLANT
WATER STERILE IRR 1000ML POUR (IV SOLUTION) ×3 IMPLANT

## 2017-03-14 NOTE — Progress Notes (Signed)
[facility] Progress Note   Subjective: C/O not eating, wants someone to tell him that surgery is going to fix anything. "Why does every one say they hope this works, why don't you know anything?" No new complaints. NPO for surgery per VVS.   Objective Vitals:   03/13/17 1725 03/13/17 2127 03/14/17 0412 03/14/17 0746  BP: 112/63 109/67 100/71 104/61  Pulse: 88 77 73 78  Resp: 18 17 17 18   Temp: 98 F (36.7 C) 97.8 F (36.6 C) 97.6 F (36.4 C) 97.8 F (36.6 C)  TempSrc: Oral Oral Oral Oral  SpO2: 100% 99% 100% 99%  Weight:  68.9 kg (151 lb 12.8 oz)    Height:       Physical Exam General: chronically ill appearing male in NAD Heart: S1,S2 2/6 systolic M Lungs: CTAB A/P Abdomen: Active BS Extremities: L pedal edema. Woody appearance BLE. Trace RLE edema with drsg R toes. Staples intact with JP drain L groin.  Dialysis Access: LFA AVF + bruit   Additional Objective Labs: Basic Metabolic Panel:  Recent Labs Lab 03/08/17 1730 03/10/17 0728 03/13/17 0840 03/14/17 0505  NA 135 135 132* 136  K 4.1 4.4 4.5 4.0  CL 96* 95* 94* 96*  CO2 25 25 22 28   GLUCOSE 96 99 112* 105*  BUN 47* 41* 49* 32*  CREATININE 8.15* 6.51* 7.37* 6.08*  CALCIUM 7.9* 8.6* 8.5* 8.7*  PHOS 4.2 4.1 5.0*  --    Liver Function Tests:  Recent Labs Lab 03/08/17 1730 03/10/17 0728 03/13/17 0840  ALBUMIN 2.3* 2.3* 2.3*   No results for input(s): LIPASE, AMYLASE in the last 168 hours. CBC:  Recent Labs Lab 03/08/17 1748 03/09/17 0404 03/10/17 0727 03/13/17 0840 03/14/17 0505  WBC 14.2* 12.7* 11.4* 11.2* 12.1*  HGB 11.2* 12.4* 12.1* 12.5* 12.2*  HCT 35.7* 39.5 38.3* 39.6 39.6  MCV 85.2 86.2 85.9 85.7 86.8  PLT 304 296 326 300 223   Blood Culture    Component Value Date/Time   SDES GROIN 03/07/2017 0915   SPECREQUEST LEFT 03/07/2017 0915   CULT  03/07/2017 0915    RARE PSEUDOMONAS AERUGINOSA NO ANAEROBES ISOLATED    REPTSTATUS 03/12/2017 FINAL 03/07/2017 0915     Cardiac Enzymes: No results for input(s): CKTOTAL, CKMB, CKMBINDEX, TROPONINI in the last 168 hours. CBG: No results for input(s): GLUCAP in the last 168 hours. Iron Studies: No results for input(s): IRON, TIBC, TRANSFERRIN, FERRITIN in the last 72 hours. @lablastinr3 @ Studies/Results: No results found. Medications: . sodium chloride 250 mL (03/06/17 0238)  . cefTAZidime (FORTAZ)  IV 1 g (03/13/17 1823)   . calcitRIOL  1.25 mcg Oral Once per day on Tue Thu Sat  . calcium acetate  2,001 mg Oral TID WC  . cinacalcet  90 mg Oral Q T,Th,Sat-1800  . enoxaparin (LOVENOX) injection  30 mg Subcutaneous Daily  . feeding supplement (NEPRO CARB STEADY)  237 mL Oral BID BM  . feeding supplement (PRO-STAT SUGAR FREE 64)  30 mL Oral BID  . gabapentin  100 mg Oral BID  . midodrine  10 mg Oral TID WC  . multivitamin  1 tablet Oral QHS  . senna  1 tablet Oral BID  . sodium chloride flush  3 mL Intravenous Q12H   Dialysis Orders:  TTS at [facility] 3:45hr, BFR 400, DFR 800, EDW 72kg, 2K/2Ca bath, AVF, Heparin 1400 bolus - Calcitriol 1.19mcg PO q HD - Venofer 100mg  x 10 ordered (4 given so far) for tsat  20% on 7/26. - Was getting sensipar 180mg  PO q HD (held on 7/28 d/t low Ca)  Assessment/Plan: 1. Sepsis 2/2 LLE and R foot wound infection/cellulitis: Was on vanc-now dc'd. BC negative. VVS consulted.  2. PAD/L foot gangrene: Patient declining amputations. 3. Recurrent L groin lymphocele: Planned I & D and possible wound vac placement.   Cult + for pseudomonas 2. ESRD -T,Th,S. Next HD tomorrow. K+ 4.0 3. Anemia - HGB 12.2 No ESA needed.  4. Secondary hyperparathyroidism - Ca 8.7 Phos 5.0. Cont binders/VDRA 5. HTN/volume - BP controlled, on soft side. Cont midodrine.  HD yesterday Pre wt 71.5 kg Net UF 1.5 Post wt 70.2 kg. Now under EDW. Lower EDW on DC to 70 kg..  6. Nutrition - NPO for surgery.   Rita H. Brown NP-C 03/14/2017, 8:28 AM  [employer] [phone] I  have seen and examined this patient and agree with plan per Alonna Buckler.  To go for debridement of wounds today.  I see no drainage for Lt thigh wound today.  HD tomorrow. Marland Kitchen Demarqus Jocson T,MD 03/14/2017 10:33 AM

## 2017-03-14 NOTE — Anesthesia Postprocedure Evaluation (Signed)
Anesthesia Post Note  Patient: Willie Andrade  Procedure(s) Performed: Procedure(s) (LRB): IRRIGATION AND DEBRIDEMENT LEFT GROIN AND THIGH INCISIONS and LOWER LEG INCISION (Left)     Patient location during evaluation: PACU Anesthesia Type: General Level of consciousness: sedated and patient cooperative Pain management: pain level controlled Vital Signs Assessment: post-procedure vital signs reviewed and stable Respiratory status: spontaneous breathing Cardiovascular status: stable Anesthetic complications: no    Last Vitals:  Vitals:   03/14/17 1305 03/14/17 1335  BP: (!) 97/56   Pulse: 63   Resp: 12   Temp:  (!) 36.3 C  SpO2: 100% 98%    Last Pain:  Vitals:   03/14/17 1220  TempSrc:   PainSc: Asleep                 Lewie Loron

## 2017-03-14 NOTE — Anesthesia Preprocedure Evaluation (Signed)
Anesthesia Evaluation  Patient identified by MRN, date of birth, ID band Patient awake    Reviewed: Allergy & Precautions, NPO status , Patient's Chart, lab work & pertinent test results  Airway Mallampati: III       Dental  (+) Edentulous Upper, Edentulous Lower   Pulmonary neg pulmonary ROS, Current Smoker,    Pulmonary exam normal breath sounds clear to auscultation       Cardiovascular hypertension, + Peripheral Vascular Disease and +CHF  Normal cardiovascular exam+ pacemaker  Rhythm:Regular Rate:Normal     Neuro/Psych negative neurological ROS  negative psych ROS   GI/Hepatic negative GI ROS, (+) Hepatitis -, B  Endo/Other  negative endocrine ROS  Renal/GU ESRF and DialysisRenal disease     Musculoskeletal negative musculoskeletal ROS (+)   Abdominal   Peds  Hematology negative hematology ROS (+)   Anesthesia Other Findings   Reproductive/Obstetrics negative OB ROS                             Anesthesia Physical  Anesthesia Plan  ASA: IV  Anesthesia Plan: General   Post-op Pain Management:    Induction: Intravenous  PONV Risk Score and Plan: 1 and Ondansetron, Promethazine and Treatment may vary due to age or medical condition  Airway Management Planned: LMA  Additional Equipment:   Intra-op Plan:   Post-operative Plan: Extubation in OR  Informed Consent: I have reviewed the patients History and Physical, chart, labs and discussed the procedure including the risks, benefits and alternatives for the proposed anesthesia with the patient or authorized representative who has indicated his/her understanding and acceptance.   Dental advisory given  Plan Discussed with: CRNA  Anesthesia Plan Comments:         Anesthesia Quick Evaluation

## 2017-03-14 NOTE — H&P (View-Only) (Signed)
Patient ID: Willie Andrade, male   DOB: 01/22/1951, 66 y.o.   MRN: [id number] Patient continues to have some drainage from Jackson-Pratt drain but this is less. Fluid is somewhat cloudy and there is still some slight purulent drainage at the saphenectomy site in the mid thigh. Suspect this is old liquefied hematoma. Have recommended exploration in the operating room since he has tenderness associated with this. His currently refusing surgery today but is willing to consider Wednesday. States that he wants to have all of his doctors on the same page. We will schedule him for return to the operating room on Wednesday for I&D of the saphenectomy site and possible re-expiration of his groin incision as well. Continues to have healing of his foot. He reports that he is having less pain and certainly has less swelling as well. 

## 2017-03-14 NOTE — Interval H&P Note (Signed)
History and Physical Interval Note:  03/14/2017 10:29 AM  Willie Andrade  has presented today for surgery, with the diagnosis of nonviable tissue  The various methods of treatment have been discussed with the patient and family. After consideration of risks, benefits and other options for treatment, the patient has consented to  Procedure(s): IRRIGATION AND DEBRIDEMENT LEFT GROIN AND THIGH INCISIONS (Left) as a surgical intervention .  The patient's history has been reviewed, patient examined, no change in status, stable for surgery.  I have reviewed the patient's chart and labs.  Questions were answered to the patient's satisfaction.     Gretta Began

## 2017-03-14 NOTE — Anesthesia Procedure Notes (Signed)
Procedure Name: LMA Insertion Date/Time: 03/14/2017 11:04 AM Performed by: Rosiland Oz Pre-anesthesia Checklist: Patient identified, Emergency Drugs available, Suction available, Patient being monitored and Timeout performed Patient Re-evaluated:Patient Re-evaluated prior to induction Oxygen Delivery Method: Circle system utilized Preoxygenation: Pre-oxygenation with 100% oxygen Induction Type: IV induction LMA: LMA inserted LMA Size: 5.0 Number of attempts: 1 Placement Confirmation: positive ETCO2 and breath sounds checked- equal and bilateral Tube secured with: Tape Dental Injury: Teeth and Oropharynx as per pre-operative assessment

## 2017-03-14 NOTE — Transfer of Care (Signed)
Immediate Anesthesia Transfer of Care Note  Patient: Willie Andrade  Procedure(s) Performed: Procedure(s): IRRIGATION AND DEBRIDEMENT LEFT GROIN AND THIGH INCISIONS and LOWER LEG INCISION (Left)  Patient Location: PACU  Anesthesia Type:General  Level of Consciousness: awake and patient cooperative  Airway & Oxygen Therapy: Patient Spontanous Breathing  Post-op Assessment: Report given to RN and Post -op Vital signs reviewed and stable  Post vital signs: Reviewed and stable  Last Vitals:  Vitals:   03/14/17 0412 03/14/17 0746  BP: 100/71 104/61  Pulse: 73 78  Resp: 17 18  Temp: 36.4 C 36.6 C  SpO2: 100% 99%    Last Pain:  Vitals:   03/14/17 0746  TempSrc: Oral  PainSc:       Patients Stated Pain Goal: 2 (03/13/17 1827)  Complications: No apparent anesthesia complications

## 2017-03-14 NOTE — Care Management Important Message (Signed)
Important Message  Patient Details  Name: Willie Andrade MRN: [id number] Date of Birth: Jul 27, 1951   Medicare Important Message Given:  Yes    Kyla Balzarine 03/14/2017, 9:28 AM

## 2017-03-14 NOTE — Op Note (Signed)
    OPERATIVE REPORT  DATE OF SURGERY: 03/14/2017  PATIENT: Willie Andrade, 66 y.o. male MRN: [id number]  DOB: Dec 05, 1950  PRE-OPERATIVE DIAGNOSIS: Drainage and potential infection prior wounds from left femoral peroneal bypass  POST-OPERATIVE DIAGNOSIS:  Same  PROCEDURE: Debridement of Incision and opening and debridement of proximal thigh incision and removal of staples and debridement of left groin incision  SURGEON:  Gretta Began, M.D.  PHYSICIAN ASSISTANT: Lianne Cure PA-C  ANESTHESIA:  Gen. gentleman  EBL: Minimal ml  Total I/O In: 500 [I.V.:500] Out: 10 [Blood:10]  BLOOD ADMINISTERED: None  DRAINS: None  SPECIMEN: Culture for sensitivity  COUNTS CORRECT:  YES  PLAN OF CARE: PACU   PATIENT DISPOSITION:  PACU - hemodynamically stable  PROCEDURE DETAILS: Patient was taken operative placed supine position. The patient had a slight opening of his mid calf vein harvest incision. Vicryl sutures were removed in this area was debrided. There is a good granulating base. Next attention was turned to mid thigh vein harvest incision. This had had some cloudy drainage and this was reopened over the course of approximately 2 cm. There was some serous lymphatic fluid was not cloudy. This was cultured. Finally the groin staples are removed and the existing Jackson-Pratt drain was removed from the groin wound. The upper third of the groin wound was reopened and was debrided. There was no exposure of the graft and no evidence of obvious pus. This portion of the incision was closed with 2-0 nylon mattress sutures. The patient was transferred to the recovery room stable condition   [name], M.D., [facility] 03/14/2017 1:54 PM

## 2017-03-14 NOTE — Progress Notes (Signed)
Personal belongings retrieved from OR desk in envelope and inventory list of items. Envelope remains sealed. Envelope and inventory list given to patient and transported to room 2w-36. Receiving nurse notified of status of personal belongings.

## 2017-03-14 NOTE — Progress Notes (Signed)
Willie Andrade           [facility]                                                                              Patient Demographics  Willie Andrade, is a 66 y.o. male, DOB - 1951-03-26, BMW:[id number]  Admit date - 03/05/2017   Admitting Physician Toy Baker, MD  Outpatient Primary MD for the patient is Lucianne Lei, MD  Outpatient specialists:   LOS - 8  days   Medical records reviewed and are as summarized below:    Chief Complaint  Patient presents with  . Abscess  . 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 No worsening drainage from his left toes. In the ED, WBC 24K, high lactate and pro-calcitonin, clinical picture consistent with cellulitis, no osteomyelitis. He was seen by home health RN who felt that his wounds were worse and was advised to come to the ED. Empirically started on IV vancomycin. Vascular surgery and nephrology were consulted.    Assessment & Plan     Recurrent left groin lymphocele - Status post excision of the lymphocele on 8/8 and drain in place which is still draining copious amounts - Vascular surgery following, operative cultures grew pseudomonas and patient placed on IV Fortaz, 10-14 days or per vascular surgery    PAD severe with left foot gangrene/purulent drainage from the left leg saphenectomy site - Vascular surgery following closely, left leg bypass is patent - Status post debridement of incision and opening and debridement of  proximal thigh incision and removal of staples and debridement of left groin incision today    ESRD (end stage renal disease) ([place]) - Nephrology consulted - on hemodialysis TTS    Pacemaker, Chronic systolic heart failure ([place]), 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 Blood cultures 2 negative. Vancomycin has been discontinued.  - Secondary to lower extremity wound infections, #1 and #2    Severe protein-calorie malnutrition ([place]) - Nutrition consulted  Code Status: Full code DVT Prophylaxis:  Lovenox  Family Communication: Discussed in detail with the patient, all imaging results, lab results explained to the patient   Disposition Plan:   Time Spent in minutes 25 minutes  Procedures:    Consultants:   Nephrology   vascular surgery  Antimicrobials:      Medications  Scheduled Meds: . calcitRIOL  1.25 mcg Oral Once per day on Tue Thu Sat  . calcium acetate  2,001 mg Oral TID WC  .  cinacalcet  90 mg Oral Q T,Th,Sat-1800  . enoxaparin (LOVENOX) injection  30 mg Subcutaneous Daily  . feeding supplement (NEPRO CARB STEADY)  237 mL Oral BID BM  . feeding supplement (PRO-STAT SUGAR FREE 64)  30 mL Oral BID  . gabapentin  100 mg Oral BID  . midodrine  10 mg Oral TID WC  . multivitamin  1 tablet Oral QHS  . senna  1 tablet Oral BID  . sodium chloride flush  3 mL Intravenous Q12H   Continuous Infusions: . sodium chloride Stopped (03/14/17 1207)  . sodium chloride 10 mL/hr at 03/14/17 1015  . cefTAZidime (FORTAZ)  IV 1 g (03/13/17 1823)   PRN Meds:.sodium chloride, acetaminophen **OR** acetaminophen, HYDROmorphone (DILAUDID) injection, ondansetron **OR** ondansetron (ZOFRAN) IV, oxyCODONE-acetaminophen, polyethylene glycol, sodium chloride  flush   Antibiotics   Anti-infectives    Start     Dose/Rate Route Frequency Ordered Stop   03/09/17 1830  cefTAZidime (FORTAZ) 1 g in dextrose 5 % 50 mL IVPB     1 g 100 mL/hr over 30 Minutes Intravenous Every 24 hours 03/09/17 1807     03/07/17 0600  ceFAZolin (ANCEF) IVPB 1 g/50 mL premix  Status:  Discontinued    Comments:  Send with pt to OR   1 g 100 mL/hr over 30 Minutes Intravenous To Surgery 03/06/17 1021 03/07/17 1118   03/06/17 2018  Vancomycin (VANCOCIN) 750-5 MG/150ML-% IVPB    Comments:  Kiang, Angelito   : cabinet override      03/06/17 2018 03/06/17 2130   03/06/17 1200  vancomycin (VANCOCIN) IVPB 750 mg/150 ml premix  Status:  Discontinued     750 mg 150 mL/hr over 60 Minutes Intravenous Every T-Th-Sa (Hemodialysis) 03/06/17 0130 03/09/17 1755   03/05/17 2345  vancomycin (VANCOCIN) 1,500 mg in sodium chloride 0.9 % 500 mL IVPB     1,500 mg 250 mL/hr over 120 Minutes Intravenous  Once 03/05/17 2342 03/06/17 0229        Subjective:   Willie Andrade was seen and examined today. Seen after the surgery. Patient denies dizziness, chest pain, shortness of breath, abdominal pain, N/V/D/C, new weakness, numbess, tingling. No acute events overnight.    Objective:   Vitals:   03/14/17 1247 03/14/17 1250 03/14/17 1305 03/14/17 1335  BP: 102/70 102/70 (!) 97/56   Pulse:  71 63   Resp:  14 12   Temp:    (!) 97.4 F (36.3 C)  TempSrc:      SpO2: 100% 100% 100% 98%  Weight:      Height:        Intake/Output Summary (Last 24 hours) at 03/14/17 1538 Last data filed at 03/14/17 1207  Gross per 24 hour  Intake              740 ml  Output               85 ml  Net              655 ml     Wt Readings from Last 3 Encounters:  03/14/17 68.5 kg (151 lb)  02/27/17 72.6 kg (160 lb)  02/13/17 74.4 kg (164 lb 0.4 oz)     Exam  General: Alert and oriented, NAD, appears comfortable   Eyes:   HEENT:    Cardiovascular: S1 S2 auscultated, no rubs, murmurs or  gallops. Regular rate and rhythm.  Respiratory: Clear to auscultation bilaterally, no wheezing, rales or rhonchi  Gastrointestinal: Soft, nontender,  nondistended, + bowel sounds  Ext: chronic dry changes in left toe and foot  Neuro:  No FND  Musculoskeletal: No digital cyanosis, clubbing  Skin: No rashes  Psych: alert and comfortable    Data Reviewed:  I have personally reviewed following labs and imaging studies  Micro Results Recent Results (from the past 240 hour(s))  Culture, blood (Routine x 2)     Status: None   Collection Time: 03/05/17  5:40 PM  Result Value Ref Range Status   Specimen Description BLOOD RIGHT HAND  Final   Special Requests IN PEDIATRIC BOTTLE Blood Culture adequate volume  Final   Culture NO GROWTH 5 DAYS  Final   Report Status 03/10/2017 FINAL  Final  Culture, blood (Routine x 2)     Status: None   Collection Time: 03/05/17  7:29 PM  Result Value Ref Range Status   Specimen Description BLOOD BLOOD RIGHT FOREARM  Final   Special Requests IN PEDIATRIC BOTTLE Blood Culture adequate volume  Final   Culture NO GROWTH 5 DAYS  Final   Report Status 03/10/2017 FINAL  Final  MRSA PCR Screening     Status: None   Collection Time: 03/06/17  2:18 AM  Result Value Ref Range Status   MRSA by PCR NEGATIVE NEGATIVE Final    Comment:        The GeneXpert MRSA Assay (FDA approved for NASAL specimens only), is one component of a comprehensive MRSA colonization surveillance program. It is not intended to diagnose MRSA infection nor to guide or monitor treatment for MRSA infections.   Surgical pcr screen     Status: None   Collection Time: 03/07/17  5:59 AM  Result Value Ref Range Status   MRSA, PCR NEGATIVE NEGATIVE Final   Staphylococcus aureus NEGATIVE NEGATIVE Final    Comment:        The Xpert SA Assay (FDA approved for NASAL specimens in patients over 43 years of age), is one component of a comprehensive surveillance program.  Test performance  has been validated by [facility] for patients greater than or equal to 38 year old. It is not intended to diagnose infection nor to guide or monitor treatment.   Aerobic/Anaerobic Culture (surgical/deep wound)     Status: None   Collection Time: 03/07/17  9:15 AM  Result Value Ref Range Status   Specimen Description GROIN  Final   Special Requests LEFT  Final   Gram Stain   Final    FEW WBC PRESENT,BOTH PMN AND MONONUCLEAR NO ORGANISMS SEEN    Culture   Final    RARE PSEUDOMONAS AERUGINOSA NO ANAEROBES ISOLATED    Report Status 03/12/2017 FINAL  Final   Organism ID, Bacteria PSEUDOMONAS AERUGINOSA  Final      Susceptibility   Pseudomonas aeruginosa - MIC*    CEFTAZIDIME 4 SENSITIVE Sensitive     CIPROFLOXACIN <=0.25 SENSITIVE Sensitive     GENTAMICIN <=1 SENSITIVE Sensitive     IMIPENEM 2 SENSITIVE Sensitive     PIP/TAZO 8 SENSITIVE Sensitive     CEFEPIME 2 SENSITIVE Sensitive     * RARE PSEUDOMONAS AERUGINOSA    Radiology Reports Dg Chest 2 View  Result Date: 03/05/2017 CLINICAL DATA:  Bilateral foot wounds, hypotension and elevated lactic acid level. EXAM: CHEST  2 VIEW COMPARISON:  02/11/2017 FINDINGS: Stable moderate cardiac enlargement and radiographic appearance of a biventricular pacing/ICD device. There is no evidence of pulmonary edema, consolidation, pneumothorax, nodule or pleural fluid. The bony thorax is  unremarkable. IMPRESSION: Stable cardiomegaly.  No acute findings in the chest. Electronically Signed   By: Aletta Edouard M.D.   On: 03/05/2017 18:20   Dg Ankle Complete Left  Result Date: 03/05/2017 CLINICAL DATA:  Possible infection.  Soft tissue swelling. EXAM: LEFT ANKLE COMPLETE - 3+ VIEW COMPARISON:  None. FINDINGS: The bones are demineralized. There is no acute fracture dislocations involving the left ankle. Dorsal calcaneal enthesophyte is noted. The ankle and subtalar joints are maintained. Vascular calcifications are noted crossing the ankle joint which  can be seen with diabetes. Diffuse soft tissue swelling and thickening is noted which may reflect chronic venous insufficiency, third spacing of fluid or possibly stigmata of a cellulitis. No bone destruction is noted. IMPRESSION: 1. Nonspecific diffuse soft tissue swelling of the included left leg and ankle possibly representing chronic venous insufficiency, third spacing of fluid or possibly cellulitis. 2. No evidence of osteomyelitis. 3. Osteopenia. Electronically Signed   By: Ashley Royalty M.D.   On: 03/05/2017 20:13   Dg Foot 2 Views Right  Result Date: 02/14/2017 CLINICAL DATA:  Bilateral foot pain.  Open wound on left foot. EXAM: RIGHT FOOT - 2 VIEW COMPARISON:  None. FINDINGS: There is moderate, diffuse soft tissue swelling of the left foot. There is no osteolysis. No soft tissue emphysema. Dorsal vascular calcifications are noted. IMPRESSION: Diffuse soft tissue swelling of the left foot. No evidence of active osteomyelitis. Electronically Signed   By: Ulyses Jarred M.D.   On: 02/14/2017 23:13   Dg Foot Complete Left  Result Date: 03/05/2017 CLINICAL DATA:  Possible infection EXAM: LEFT FOOT - COMPLETE 3+ VIEW COMPARISON:  None. FINDINGS: Diffuse soft tissue swelling is noted, nonspecific but possibly representing stigmata of cellulitis, third spacing of fluid or possibly from chronic venous insufficiency. Bones are demineralized in appearance without frank bone destruction. Mild degenerative joint space narrowing is seen of the toes. No acute fracture joint dislocations. Dorsal calcaneal enthesophyte is seen. Ankle mortise and subtalar articulations are maintained as are the midfoot joints. IMPRESSION: 1. Osteopenic appearance of the left foot without fracture or frank bone destruction. 2. Soft tissue swelling of the included leg, ankle and foot. Electronically Signed   By: Ashley Royalty M.D.   On: 03/05/2017 20:16   Dg Foot Complete Left  Result Date: 02/14/2017 CLINICAL DATA:  Bilateral foot pain  and swelling with open wounds of the left foot x1 day. EXAM: LEFT FOOT - COMPLETE 3+ VIEW COMPARISON:  Left third and fourth toe radiographs from 01/16/2017 FINDINGS: Soft tissue ulcerations involving the third and fourth toes without underlying fracture nor apparent bone destruction. Joint spaces are intact. The bones are osteopenic. Dorsal calcaneal enthesophyte is noted. Vascular calcifications about the visualized ankle and foot consistent with diabetes. Diffuse generalized soft tissue edema and swelling of the included ankle and dorsum of the foot. IMPRESSION: Generalized soft tissue swelling of the included ankle and foot more so along the dorsum of the forefoot. Soft tissue ulcerations noted of the third and fourth toes without frank bone destruction, fracture nor dislocations. Electronically Signed   By: Ashley Royalty M.D.   On: 02/14/2017 23:14   Dg Foot Complete Right  Result Date: 03/05/2017 CLINICAL DATA:  Possible infection. EXAM: RIGHT FOOT COMPLETE - 3+ VIEW COMPARISON:  None. FINDINGS: Diffuse soft tissue swelling of the included leg, ankle and foot especially over the dorsum of the forefoot. No underlying bone destruction or fracture. No joint dislocations. Bones are demineralized in appearance. Dorsal calcaneal enthesophyte is  seen. IMPRESSION: 1. Nonspecific soft tissue swelling of the included leg, ankle and foot. Findings could be due to third spacing of fluid, cellulitis or possibly chronic venous insufficiency. 2. No underlying fracture. 3. No evidence of osteomyelitis. Electronically Signed   By: Ashley Royalty M.D.   On: 03/05/2017 20:19    Lab Data:  CBC:  Recent Labs Lab 03/08/17 1748 03/09/17 0404 03/10/17 0727 03/13/17 0840 03/14/17 0505  WBC 14.2* 12.7* 11.4* 11.2* 12.1*  HGB 11.2* 12.4* 12.1* 12.5* 12.2*  HCT 35.7* 39.5 38.3* 39.6 39.6  MCV 85.2 86.2 85.9 85.7 86.8  PLT 304 296 326 300 010   Basic Metabolic Panel:  Recent Labs Lab 03/08/17 1730 03/10/17 0728  03/13/17 0840 03/14/17 0505  NA 135 135 132* 136  K 4.1 4.4 4.5 4.0  CL 96* 95* 94* 96*  CO2 '25 25 22 28  '$ GLUCOSE 96 99 112* 105*  BUN 47* 41* 49* 32*  CREATININE 8.15* 6.51* 7.37* 6.08*  CALCIUM 7.9* 8.6* 8.5* 8.7*  PHOS 4.2 4.1 5.0*  --    GFR: Estimated Creatinine Clearance: 11.6 mL/min (A) (by C-G formula based on SCr of 6.08 mg/dL (H)). Liver Function Tests:  Recent Labs Lab 03/08/17 1730 03/10/17 0728 03/13/17 0840  ALBUMIN 2.3* 2.3* 2.3*   No results for input(s): LIPASE, AMYLASE in the last 168 hours. No results for input(s): AMMONIA in the last 168 hours. Coagulation Profile:  Recent Labs Lab 03/14/17 0505  INR 1.34   Cardiac Enzymes: No results for input(s): CKTOTAL, CKMB, CKMBINDEX, TROPONINI in the last 168 hours. BNP (last 3 results) No results for input(s): PROBNP in the last 8760 hours. HbA1C: No results for input(s): HGBA1C in the last 72 hours. CBG: No results for input(s): GLUCAP in the last 168 hours. Lipid Profile: No results for input(s): CHOL, HDL, LDLCALC, TRIG, CHOLHDL, LDLDIRECT in the last 72 hours. Thyroid Function Tests: No results for input(s): TSH, T4TOTAL, FREET4, T3FREE, THYROIDAB in the last 72 hours. Anemia Panel: No results for input(s): VITAMINB12, FOLATE, FERRITIN, TIBC, IRON, RETICCTPCT in the last 72 hours. Urine analysis: No results found for: COLORURINE, APPEARANCEUR, LABSPEC, PHURINE, GLUCOSEU, HGBUR, BILIRUBINUR, KETONESUR, PROTEINUR, UROBILINOGEN, NITRITE, LEUKOCYTESUR   Ailine Hefferan M.D. [facility] 03/14/2017, 3:38 PM  Pager: [phone] Between 7am to 7pm - call Pager - [phone]  After 7pm go to [email or web address] - password [email or web address]  Call night coverage person covering after 7pm

## 2017-03-15 ENCOUNTER — Encounter (HOSPITAL_COMMUNITY): Payer: Self-pay | Admitting: Vascular Surgery

## 2017-03-15 ENCOUNTER — Encounter: Payer: Self-pay | Admitting: Family

## 2017-03-15 LAB — CBC
HEMATOCRIT: 34.4 % — AB (ref 39.0–52.0)
HEMOGLOBIN: 10.7 g/dL — AB (ref 13.0–17.0)
MCH: 27.2 pg (ref 26.0–34.0)
MCHC: 31.1 g/dL (ref 30.0–36.0)
MCV: 87.3 fL (ref 78.0–100.0)
Platelets: 187 10*3/uL (ref 150–400)
RBC: 3.94 MIL/uL — AB (ref 4.22–5.81)
RDW: 19.6 % — ABNORMAL HIGH (ref 11.5–15.5)
WBC: 12.7 10*3/uL — ABNORMAL HIGH (ref 4.0–10.5)

## 2017-03-15 LAB — RENAL FUNCTION PANEL
ALBUMIN: 2.4 g/dL — AB (ref 3.5–5.0)
ANION GAP: 14 (ref 5–15)
BUN: 44 mg/dL — ABNORMAL HIGH (ref 6–20)
CALCIUM: 8.4 mg/dL — AB (ref 8.9–10.3)
CO2: 25 mmol/L (ref 22–32)
Chloride: 97 mmol/L — ABNORMAL LOW (ref 101–111)
Creatinine, Ser: 7.22 mg/dL — ABNORMAL HIGH (ref 0.61–1.24)
GFR calc non Af Amer: 7 mL/min — ABNORMAL LOW (ref >60)
GFR, EST AFRICAN AMERICAN: 8 mL/min — AB (ref >60)
Glucose, Bld: 106 mg/dL — ABNORMAL HIGH (ref 65–99)
PHOSPHORUS: 4.8 mg/dL — AB (ref 2.5–4.6)
Potassium: 3.9 mmol/L (ref 3.5–5.1)
SODIUM: 136 mmol/L (ref 135–145)

## 2017-03-15 MED ORDER — MIDODRINE HCL 5 MG PO TABS
ORAL_TABLET | ORAL | Status: AC
  Filled 2017-03-15: qty 2

## 2017-03-15 NOTE — Procedures (Signed)
Pt seen on HD.  Ap 170 Vp 200  BFR 400.  CO pain at surgical sites.  Tolerating HD well so far.  SBP 96

## 2017-03-15 NOTE — Progress Notes (Signed)
Marland Kitchen           [facility]                                                                              Patient Demographics  Willie Andrade, is a 66 y.o. male, DOB - 1950-12-17, MVH:[id number]  Admit date - 03/05/2017   Admitting Physician Toy Baker, MD  Outpatient Primary MD for the patient is Lucianne Lei, MD  Outpatient specialists:   LOS - 9  days   Medical records reviewed and are as summarized below:    Chief Complaint  Patient presents with  . Abscess  . 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 No worsening drainage from his left toes. In the ED, WBC 24K, high lactate and pro-calcitonin, clinical picture consistent with cellulitis, no osteomyelitis. He was seen by home health RN who felt that his wounds were worse and was advised to come to the ED. Empirically started on IV vancomycin. Vascular surgery and nephrology were consulted.    Assessment & Plan     Recurrent left groin lymphocele - Status post excision of the lymphocele on 8/8 and drain in place which is still draining copious amounts - Vascular surgery following, operative cultures grew pseudomonas and patient placed on IV Fortaz, 10-14 days or per vascular surgery    PAD severe with left foot gangrene/purulent drainage from the left leg saphenectomy site - Vascular surgery following closely, left leg bypass is patent - Status post debridement of incision and opening and debridement of  proximal thigh incision and removal of staples and debridement of left groin incision     ESRD (end stage renal disease) ([place]) - Nephrology consulted - on hemodialysis TTS    Pacemaker, Chronic systolic heart failure ([place]), 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  - Blood cultures 2 negative. Vancomycin has been discontinued.  - Secondary to lower extremity wound infections, #1 and #2    Severe protein-calorie malnutrition ([place]) - Nutrition consulted  Code Status: Full code DVT Prophylaxis:  Lovenox  Family Communication: Discussed in detail with the patient, all imaging results, lab results explained to the patient   Disposition Plan:   Time Spent in minutes 15 minutes  Procedures:    Consultants:   Nephrology   vascular surgery  Antimicrobials:      Medications  Scheduled Meds: . calcitRIOL  1.25 mcg Oral Once per day on Tue Thu Sat  . calcium acetate  2,001 mg Oral TID WC  .  cinacalcet  90 mg Oral Q T,Th,Sat-1800  . enoxaparin (LOVENOX) injection  30 mg Subcutaneous Daily  . feeding supplement (NEPRO CARB STEADY)  237 mL Oral BID BM  . feeding supplement (PRO-STAT SUGAR FREE 64)  30 mL Oral BID  . gabapentin  100 mg Oral BID  . midodrine  10 mg Oral TID WC  . multivitamin  1 tablet Oral QHS  . senna  1 tablet Oral BID  . sodium chloride flush  3 mL Intravenous Q12H   Continuous Infusions: . sodium chloride Stopped (03/14/17 1207)  . sodium chloride 10 mL/hr at 03/14/17 1015  . cefTAZidime (FORTAZ)  IV 1 g (03/14/17 2228)   PRN Meds:.sodium chloride, acetaminophen **OR** acetaminophen, HYDROmorphone (DILAUDID) injection, ondansetron **OR** ondansetron (ZOFRAN) IV, oxyCODONE-acetaminophen, polyethylene glycol, sodium chloride  flush   Antibiotics   Anti-infectives    Start     Dose/Rate Route Frequency Ordered Stop   03/09/17 1830  cefTAZidime (FORTAZ) 1 g in dextrose 5 % 50 mL IVPB     1 g 100 mL/hr over 30 Minutes Intravenous Every 24 hours 03/09/17 1807     03/07/17 0600  ceFAZolin (ANCEF) IVPB 1 g/50 mL premix  Status:  Discontinued    Comments:  Send with pt to OR   1 g 100 mL/hr over 30 Minutes Intravenous To Surgery 03/06/17 1021 03/07/17 1118   03/06/17 2018  Vancomycin (VANCOCIN) 750-5 MG/150ML-% IVPB    Comments:  Kiang, Angelito   : cabinet override      03/06/17 2018 03/06/17 2130   03/06/17 1200  vancomycin (VANCOCIN) IVPB 750 mg/150 ml premix  Status:  Discontinued     750 mg 150 mL/hr over 60 Minutes Intravenous Every T-Th-Sa (Hemodialysis) 03/06/17 0130 03/09/17 1755   03/05/17 2345  vancomycin (VANCOCIN) 1,500 mg in sodium chloride 0.9 % 500 mL IVPB     1,500 mg 250 mL/hr over 120 Minutes Intravenous  Once 03/05/17 2342 03/06/17 0229        Subjective:   Willie Andrade was seen and examined today. Seen during hemodialysis, denies any specific complaints. Patient denies dizziness, chest pain, shortness of breath, abdominal pain, N/V/D/C, new weakness, numbess, tingling. No acute events overnight.    Objective:   Vitals:   03/15/17 1100 03/15/17 1130 03/15/17 1209 03/15/17 1306  BP: (!) 95/53 99/61 (!) 97/57 98/63  Pulse: 81 78 75 81  Resp:   18 18  Temp:   97.6 F (36.4 C) 98.2 F (36.8 C)  TempSrc:   Oral Oral  SpO2:   99% 100%  Weight:   69.4 kg (153 lb)   Height:        Intake/Output Summary (Last 24 hours) at 03/15/17 1539 Last data filed at 03/15/17 1209  Gross per 24 hour  Intake            580.5 ml  Output             1000 ml  Net           -419.5 ml     Wt Readings from Last 3 Encounters:  03/15/17 69.4 kg (153 lb)  02/27/17 72.6 kg (160 lb)  02/13/17 74.4 kg (164 lb 0.4 oz)     Exam General: Alert and pleasant Eyes:  HEENT:   Cardiovascular: S1  S2 auscultated, no rubs, murmurs or gallops. Regular rate and rhythm.  Respiratory: Clear to auscultation bilaterally, no wheezing, rales or rhonchi Gastrointestinal: Soft, nontender, nondistended, + bowel sounds Ext: dressing intact  left lower extremity Neuro: no new deficits Musculoskeletal: No digital cyanosis, clubbing Skin: No rashes Psych: Normal affect and demeanor, alert and oriented x3      Data Reviewed:  I have personally reviewed following labs and imaging studies  Micro Results Recent Results (from the past 240 hour(s))  Culture, blood (Routine x 2)     Status: None   Collection Time: 03/05/17  5:40 PM  Result Value Ref Range Status   Specimen Description BLOOD RIGHT HAND  Final   Special Requests IN PEDIATRIC BOTTLE Blood Culture adequate volume  Final   Culture NO GROWTH 5 DAYS  Final   Report Status 03/10/2017 FINAL  Final  Culture, blood (Routine x 2)     Status: None   Collection Time: 03/05/17  7:29 PM  Result Value Ref Range Status   Specimen Description BLOOD BLOOD RIGHT FOREARM  Final   Special Requests IN PEDIATRIC BOTTLE Blood Culture adequate volume  Final   Culture NO GROWTH 5 DAYS  Final   Report Status 03/10/2017 FINAL  Final  MRSA PCR Screening     Status: None   Collection Time: 03/06/17  2:18 AM  Result Value Ref Range Status   MRSA by PCR NEGATIVE NEGATIVE Final    Comment:        The GeneXpert MRSA Assay (FDA approved for NASAL specimens only), is one component of a comprehensive MRSA colonization surveillance program. It is not intended to diagnose MRSA infection nor to guide or monitor treatment for MRSA infections.   Surgical pcr screen     Status: None   Collection Time: 03/07/17  5:59 AM  Result Value Ref Range Status   MRSA, PCR NEGATIVE NEGATIVE Final   Staphylococcus aureus NEGATIVE NEGATIVE Final    Comment:        The Xpert SA Assay (FDA approved for NASAL specimens in patients over 54 years of age), is one component  of a comprehensive surveillance program.  Test performance has been validated by [facility] for patients greater than or equal to 55 year old. It is not intended to diagnose infection nor to guide or monitor treatment.   Aerobic/Anaerobic Culture (surgical/deep wound)     Status: None   Collection Time: 03/07/17  9:15 AM  Result Value Ref Range Status   Specimen Description GROIN  Final   Special Requests LEFT  Final   Gram Stain   Final    FEW WBC PRESENT,BOTH PMN AND MONONUCLEAR NO ORGANISMS SEEN    Culture   Final    RARE PSEUDOMONAS AERUGINOSA NO ANAEROBES ISOLATED    Report Status 03/12/2017 FINAL  Final   Organism ID, Bacteria PSEUDOMONAS AERUGINOSA  Final      Susceptibility   Pseudomonas aeruginosa - MIC*    CEFTAZIDIME 4 SENSITIVE Sensitive     CIPROFLOXACIN <=0.25 SENSITIVE Sensitive     GENTAMICIN <=1 SENSITIVE Sensitive     IMIPENEM 2 SENSITIVE Sensitive     PIP/TAZO 8 SENSITIVE Sensitive     CEFEPIME 2 SENSITIVE Sensitive     * RARE PSEUDOMONAS AERUGINOSA  Aerobic Culture (superficial specimen)     Status: None (Preliminary result)   Collection Time: 03/14/17 11:32 AM  Result Value Ref Range Status   Specimen Description WOUND LEFT THIGH  Final   Special Requests VEIN HARVEST INCISION POF FORTAZ  Final   Gram Stain   Final    RARE WBC PRESENT,BOTH PMN AND MONONUCLEAR NO ORGANISMS SEEN    Culture CULTURE REINCUBATED  FOR BETTER GROWTH  Final   Report Status PENDING  Incomplete    Radiology Reports Dg Chest 2 View  Result Date: 03/05/2017 CLINICAL DATA:  Bilateral foot wounds, hypotension and elevated lactic acid level. EXAM: CHEST  2 VIEW COMPARISON:  02/11/2017 FINDINGS: Stable moderate cardiac enlargement and radiographic appearance of a biventricular pacing/ICD device. There is no evidence of pulmonary edema, consolidation, pneumothorax, nodule or pleural fluid. The bony thorax is unremarkable. IMPRESSION: Stable cardiomegaly.  No acute findings in the  chest. Electronically Signed   By: Aletta Edouard M.D.   On: 03/05/2017 18:20   Dg Ankle Complete Left  Result Date: 03/05/2017 CLINICAL DATA:  Possible infection.  Soft tissue swelling. EXAM: LEFT ANKLE COMPLETE - 3+ VIEW COMPARISON:  None. FINDINGS: The bones are demineralized. There is no acute fracture dislocations involving the left ankle. Dorsal calcaneal enthesophyte is noted. The ankle and subtalar joints are maintained. Vascular calcifications are noted crossing the ankle joint which can be seen with diabetes. Diffuse soft tissue swelling and thickening is noted which may reflect chronic venous insufficiency, third spacing of fluid or possibly stigmata of a cellulitis. No bone destruction is noted. IMPRESSION: 1. Nonspecific diffuse soft tissue swelling of the included left leg and ankle possibly representing chronic venous insufficiency, third spacing of fluid or possibly cellulitis. 2. No evidence of osteomyelitis. 3. Osteopenia. Electronically Signed   By: Ashley Royalty M.D.   On: 03/05/2017 20:13   Dg Foot 2 Views Right  Result Date: 02/14/2017 CLINICAL DATA:  Bilateral foot pain.  Open wound on left foot. EXAM: RIGHT FOOT - 2 VIEW COMPARISON:  None. FINDINGS: There is moderate, diffuse soft tissue swelling of the left foot. There is no osteolysis. No soft tissue emphysema. Dorsal vascular calcifications are noted. IMPRESSION: Diffuse soft tissue swelling of the left foot. No evidence of active osteomyelitis. Electronically Signed   By: Ulyses Jarred M.D.   On: 02/14/2017 23:13   Dg Foot Complete Left  Result Date: 03/05/2017 CLINICAL DATA:  Possible infection EXAM: LEFT FOOT - COMPLETE 3+ VIEW COMPARISON:  None. FINDINGS: Diffuse soft tissue swelling is noted, nonspecific but possibly representing stigmata of cellulitis, third spacing of fluid or possibly from chronic venous insufficiency. Bones are demineralized in appearance without frank bone destruction. Mild degenerative joint space  narrowing is seen of the toes. No acute fracture joint dislocations. Dorsal calcaneal enthesophyte is seen. Ankle mortise and subtalar articulations are maintained as are the midfoot joints. IMPRESSION: 1. Osteopenic appearance of the left foot without fracture or frank bone destruction. 2. Soft tissue swelling of the included leg, ankle and foot. Electronically Signed   By: Ashley Royalty M.D.   On: 03/05/2017 20:16   Dg Foot Complete Left  Result Date: 02/14/2017 CLINICAL DATA:  Bilateral foot pain and swelling with open wounds of the left foot x1 day. EXAM: LEFT FOOT - COMPLETE 3+ VIEW COMPARISON:  Left third and fourth toe radiographs from 01/16/2017 FINDINGS: Soft tissue ulcerations involving the third and fourth toes without underlying fracture nor apparent bone destruction. Joint spaces are intact. The bones are osteopenic. Dorsal calcaneal enthesophyte is noted. Vascular calcifications about the visualized ankle and foot consistent with diabetes. Diffuse generalized soft tissue edema and swelling of the included ankle and dorsum of the foot. IMPRESSION: Generalized soft tissue swelling of the included ankle and foot more so along the dorsum of the forefoot. Soft tissue ulcerations noted of the third and fourth toes without frank bone destruction, fracture nor dislocations. Electronically  Signed   By: Ashley Royalty M.D.   On: 02/14/2017 23:14   Dg Foot Complete Right  Result Date: 03/05/2017 CLINICAL DATA:  Possible infection. EXAM: RIGHT FOOT COMPLETE - 3+ VIEW COMPARISON:  None. FINDINGS: Diffuse soft tissue swelling of the included leg, ankle and foot especially over the dorsum of the forefoot. No underlying bone destruction or fracture. No joint dislocations. Bones are demineralized in appearance. Dorsal calcaneal enthesophyte is seen. IMPRESSION: 1. Nonspecific soft tissue swelling of the included leg, ankle and foot. Findings could be due to third spacing of fluid, cellulitis or possibly chronic  venous insufficiency. 2. No underlying fracture. 3. No evidence of osteomyelitis. Electronically Signed   By: Ashley Royalty M.D.   On: 03/05/2017 20:19    Lab Data:  CBC:  Recent Labs Lab 03/10/17 0727 03/13/17 0840 03/14/17 0505 03/14/17 1832 03/15/17 0815  WBC 11.4* 11.2* 12.1* 13.1* 12.7*  HGB 12.1* 12.5* 12.2* 11.6* 10.7*  HCT 38.3* 39.6 39.6 38.3* 34.4*  MCV 85.9 85.7 86.8 88.2 87.3  PLT 326 300 223 205 076   Basic Metabolic Panel:  Recent Labs Lab 03/08/17 1730 03/10/17 0728 03/13/17 0840 03/14/17 0505 03/14/17 1832 03/15/17 0815  NA 135 135 132* 136 138 136  K 4.1 4.4 4.5 4.0 4.4 3.9  CL 96* 95* 94* 96* 98* 97*  CO2 '25 25 22 28 25 25  '$ GLUCOSE 96 99 112* 105* 110* 106*  BUN 47* 41* 49* 32* 36* 44*  CREATININE 8.15* 6.51* 7.37* 6.08* 6.39* 7.22*  CALCIUM 7.9* 8.6* 8.5* 8.7* 8.5* 8.4*  PHOS 4.2 4.1 5.0*  --  4.5 4.8*   GFR: Estimated Creatinine Clearance: 9.9 mL/min (A) (by C-G formula based on SCr of 7.22 mg/dL (H)). Liver Function Tests:  Recent Labs Lab 03/08/17 1730 03/10/17 0728 03/13/17 0840 03/14/17 1832 03/15/17 0815  ALBUMIN 2.3* 2.3* 2.3* 2.4* 2.4*   No results for input(s): LIPASE, AMYLASE in the last 168 hours. No results for input(s): AMMONIA in the last 168 hours. Coagulation Profile:  Recent Labs Lab 03/14/17 0505  INR 1.34   Cardiac Enzymes: No results for input(s): CKTOTAL, CKMB, CKMBINDEX, TROPONINI in the last 168 hours. BNP (last 3 results) No results for input(s): PROBNP in the last 8760 hours. HbA1C: No results for input(s): HGBA1C in the last 72 hours. CBG: No results for input(s): GLUCAP in the last 168 hours. Lipid Profile: No results for input(s): CHOL, HDL, LDLCALC, TRIG, CHOLHDL, LDLDIRECT in the last 72 hours. Thyroid Function Tests: No results for input(s): TSH, T4TOTAL, FREET4, T3FREE, THYROIDAB in the last 72 hours. Anemia Panel: No results for input(s): VITAMINB12, FOLATE, FERRITIN, TIBC, IRON, RETICCTPCT in  the last 72 hours. Urine analysis: No results found for: COLORURINE, APPEARANCEUR, LABSPEC, PHURINE, GLUCOSEU, HGBUR, BILIRUBINUR, KETONESUR, PROTEINUR, UROBILINOGEN, NITRITE, LEUKOCYTESUR   Randle Shatzer M.D. [facility] 03/15/2017, 3:39 PM  Pager: [phone] Between 7am to 7pm - call Pager - [phone]  After 7pm go to [email or web address] - password [email or web address]  Call night coverage person covering after 7pm  .           [facility]  Patient Demographics  Willie Andrade, is a 67 y.o. male, DOB - March 26, 1951, BTD:[id number]  Admit date - 03/05/2017   Admitting Physician Toy Baker, MD  Outpatient Primary MD for the patient is Lucianne Lei, MD  Outpatient specialists:   LOS - 9  days   Medical records reviewed and are as summarized below:    Chief Complaint  Patient presents with  . Abscess  . 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 No worsening drainage from his left toes. In the ED, WBC 24K, high lactate and pro-calcitonin, clinical picture consistent with cellulitis, no osteomyelitis. He was seen by home health RN who felt that his wounds were worse and was advised to come to the ED. Empirically started on IV vancomycin. Vascular surgery and nephrology were consulted.    Assessment & Plan     Recurrent left groin lymphocele - Status post excision of the lymphocele on 8/8 and  drain in place which is still draining copious amounts - Vascular surgery following, operative cultures grew pseudomonas and patient placed on IV Fortaz, 10-14 days or per vascular surgery    PAD severe with left foot gangrene/purulent drainage from the left leg saphenectomy site - Vascular surgery following closely, left leg bypass is patent - Status post debridement of incision and opening and debridement of proximal thigh incision and removal of staples and debridement of left groin incision today    ESRD (end stage renal disease) ([place]) - Nephrology consulted - on hemodialysis TTS    Pacemaker, Chronic systolic heart failure ([place]), 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 Blood cultures 2 negative. Vancomycin has been discontinued.  - Secondary to lower extremity wound infections, #1 and #2    Severe protein-calorie malnutrition ([place]) - Nutrition consulted  Code Status: Full code DVT Prophylaxis:  Lovenox  Family Communication: Discussed in detail with the patient, all imaging results, lab results explained to the patient   Disposition Plan:   Time Spent in minutes 25 minutes  Procedures:    Consultants:   Nephrology   vascular surgery  Antimicrobials:      Medications  Scheduled Meds: . calcitRIOL  1.25 mcg Oral Once per day on Tue Thu Sat  . calcium acetate  2,001 mg Oral TID WC  . cinacalcet  90 mg Oral Q T,Th,Sat-1800  . enoxaparin (LOVENOX) injection  30 mg Subcutaneous Daily  . feeding supplement (NEPRO CARB STEADY)  237 mL Oral BID BM  . feeding supplement (PRO-STAT SUGAR FREE 64)  30 mL Oral BID  . gabapentin  100 mg Oral BID  . midodrine  10 mg Oral TID WC  . multivitamin  1 tablet Oral QHS  . senna  1 tablet Oral BID  . sodium  chloride flush  3 mL Intravenous Q12H   Continuous  Infusions: . sodium chloride Stopped (03/14/17 1207)  . sodium chloride 10 mL/hr at 03/14/17 1015  . cefTAZidime (FORTAZ)  IV 1 g (03/14/17 2228)   PRN Meds:.sodium chloride, acetaminophen **OR** acetaminophen, HYDROmorphone (DILAUDID) injection, ondansetron **OR** ondansetron (ZOFRAN) IV, oxyCODONE-acetaminophen, polyethylene glycol, sodium chloride flush   Antibiotics   Anti-infectives    Start     Dose/Rate Route Frequency Ordered Stop   03/09/17 1830  cefTAZidime (FORTAZ) 1 g in dextrose 5 % 50 mL IVPB     1 g 100 mL/hr over 30 Minutes Intravenous Every 24 hours 03/09/17 1807     03/07/17 0600  ceFAZolin (ANCEF) IVPB 1 g/50 mL premix  Status:  Discontinued    Comments:  Send with pt to OR   1 g 100 mL/hr over 30 Minutes Intravenous To Surgery 03/06/17 1021 03/07/17 1118   03/06/17 2018  Vancomycin (VANCOCIN) 750-5 MG/150ML-% IVPB    Comments:  Kiang, Angelito   : cabinet override      03/06/17 2018 03/06/17 2130   03/06/17 1200  vancomycin (VANCOCIN) IVPB 750 mg/150 ml premix  Status:  Discontinued     750 mg 150 mL/hr over 60 Minutes Intravenous Every T-Th-Sa (Hemodialysis) 03/06/17 0130 03/09/17 1755   03/05/17 2345  vancomycin (VANCOCIN) 1,500 mg in sodium chloride 0.9 % 500 mL IVPB     1,500 mg 250 mL/hr over 120 Minutes Intravenous  Once 03/05/17 2342 03/06/17 0229        Subjective:   Willie Andrade was seen and examined today. Seen after the surgery. Patient denies dizziness, chest pain, shortness of breath, abdominal pain, N/V/D/C, new weakness, numbess, tingling. No acute events overnight.    Objective:   Vitals:   03/15/17 1100 03/15/17 1130 03/15/17 1209 03/15/17 1306  BP: (!) 95/53 99/61 (!) 97/57 98/63  Pulse: 81 78 75 81  Resp:   18 18  Temp:   97.6 F (36.4 C) 98.2 F (36.8 C)  TempSrc:   Oral Oral  SpO2:   99% 100%  Weight:   69.4 kg (153 lb)   Height:        Intake/Output Summary (Last 24  hours) at 03/15/17 1540 Last data filed at 03/15/17 1209  Gross per 24 hour  Intake            580.5 ml  Output             1000 ml  Net           -419.5 ml     Wt Readings from Last 3 Encounters:  03/15/17 69.4 kg (153 lb)  02/27/17 72.6 kg (160 lb)  02/13/17 74.4 kg (164 lb 0.4 oz)     Exam  General: Alert and oriented, NAD, appears comfortable   Eyes:   HEENT:    Cardiovascular: S1 S2 auscultated, no rubs, murmurs or gallops. Regular rate and rhythm.  Respiratory: Clear to auscultation bilaterally, no wheezing, rales or rhonchi  Gastrointestinal: Soft, nontender, nondistended, + bowel sounds  Ext: chronic dry changes in left toe and foot  Neuro:  No FND  Musculoskeletal: No digital cyanosis, clubbing  Skin: No rashes  Psych: alert and comfortable    Data Reviewed:  I have personally reviewed following labs and imaging studies  Micro Results Recent Results (from the past 240 hour(s))  Culture, blood (Routine x 2)     Status: None   Collection Time: 03/05/17  5:40 PM  Result Value Ref Range Status   Specimen Description BLOOD RIGHT  HAND  Final   Special Requests IN PEDIATRIC BOTTLE Blood Culture adequate volume  Final   Culture NO GROWTH 5 DAYS  Final   Report Status 03/10/2017 FINAL  Final  Culture, blood (Routine x 2)     Status: None   Collection Time: 03/05/17  7:29 PM  Result Value Ref Range Status   Specimen Description BLOOD BLOOD RIGHT FOREARM  Final   Special Requests IN PEDIATRIC BOTTLE Blood Culture adequate volume  Final   Culture NO GROWTH 5 DAYS  Final   Report Status 03/10/2017 FINAL  Final  MRSA PCR Screening     Status: None   Collection Time: 03/06/17  2:18 AM  Result Value Ref Range Status   MRSA by PCR NEGATIVE NEGATIVE Final    Comment:        The GeneXpert MRSA Assay (FDA approved for NASAL specimens only), is one component of a comprehensive MRSA colonization surveillance program. It is not intended to diagnose  MRSA infection nor to guide or monitor treatment for MRSA infections.   Surgical pcr screen     Status: None   Collection Time: 03/07/17  5:59 AM  Result Value Ref Range Status   MRSA, PCR NEGATIVE NEGATIVE Final   Staphylococcus aureus NEGATIVE NEGATIVE Final    Comment:        The Xpert SA Assay (FDA approved for NASAL specimens in patients over 66 years of age), is one component of a comprehensive surveillance program.  Test performance has been validated by [facility] for patients greater than or equal to 21 year old. It is not intended to diagnose infection nor to guide or monitor treatment.   Aerobic/Anaerobic Culture (surgical/deep wound)     Status: None   Collection Time: 03/07/17  9:15 AM  Result Value Ref Range Status   Specimen Description GROIN  Final   Special Requests LEFT  Final   Gram Stain   Final    FEW WBC PRESENT,BOTH PMN AND MONONUCLEAR NO ORGANISMS SEEN    Culture   Final    RARE PSEUDOMONAS AERUGINOSA NO ANAEROBES ISOLATED    Report Status 03/12/2017 FINAL  Final   Organism ID, Bacteria PSEUDOMONAS AERUGINOSA  Final      Susceptibility   Pseudomonas aeruginosa - MIC*    CEFTAZIDIME 4 SENSITIVE Sensitive     CIPROFLOXACIN <=0.25 SENSITIVE Sensitive     GENTAMICIN <=1 SENSITIVE Sensitive     IMIPENEM 2 SENSITIVE Sensitive     PIP/TAZO 8 SENSITIVE Sensitive     CEFEPIME 2 SENSITIVE Sensitive     * RARE PSEUDOMONAS AERUGINOSA  Aerobic Culture (superficial specimen)     Status: None (Preliminary result)   Collection Time: 03/14/17 11:32 AM  Result Value Ref Range Status   Specimen Description WOUND LEFT THIGH  Final   Special Requests VEIN HARVEST INCISION POF FORTAZ  Final   Gram Stain   Final    RARE WBC PRESENT,BOTH PMN AND MONONUCLEAR NO ORGANISMS SEEN    Culture CULTURE REINCUBATED FOR BETTER GROWTH  Final   Report Status PENDING  Incomplete    Radiology Reports Dg Chest 2 View  Result Date: 03/05/2017 CLINICAL DATA:  Bilateral  foot wounds, hypotension and elevated lactic acid level. EXAM: CHEST  2 VIEW COMPARISON:  02/11/2017 FINDINGS: Stable moderate cardiac enlargement and radiographic appearance of a biventricular pacing/ICD device. There is no evidence of pulmonary edema, consolidation, pneumothorax, nodule or pleural fluid. The bony thorax is unremarkable. IMPRESSION: Stable cardiomegaly.  No acute  findings in the chest. Electronically Signed   By: Irish Lack M.D.   On: 03/05/2017 18:20   Dg Ankle Complete Left  Result Date: 03/05/2017 CLINICAL DATA:  Possible infection.  Soft tissue swelling. EXAM: LEFT ANKLE COMPLETE - 3+ VIEW COMPARISON:  None. FINDINGS: The bones are demineralized. There is no acute fracture dislocations involving the left ankle. Dorsal calcaneal enthesophyte is noted. The ankle and subtalar joints are maintained. Vascular calcifications are noted crossing the ankle joint which can be seen with diabetes. Diffuse soft tissue swelling and thickening is noted which may reflect chronic venous insufficiency, third spacing of fluid or possibly stigmata of a cellulitis. No bone destruction is noted. IMPRESSION: 1. Nonspecific diffuse soft tissue swelling of the included left leg and ankle possibly representing chronic venous insufficiency, third spacing of fluid or possibly cellulitis. 2. No evidence of osteomyelitis. 3. Osteopenia. Electronically Signed   By: Tollie Eth M.D.   On: 03/05/2017 20:13   Dg Foot 2 Views Right  Result Date: 02/14/2017 CLINICAL DATA:  Bilateral foot pain.  Open wound on left foot. EXAM: RIGHT FOOT - 2 VIEW COMPARISON:  None. FINDINGS: There is moderate, diffuse soft tissue swelling of the left foot. There is no osteolysis. No soft tissue emphysema. Dorsal vascular calcifications are noted. IMPRESSION: Diffuse soft tissue swelling of the left foot. No evidence of active osteomyelitis. Electronically Signed   By: Deatra Robinson M.D.   On: 02/14/2017 23:13   Dg Foot Complete  Left  Result Date: 03/05/2017 CLINICAL DATA:  Possible infection EXAM: LEFT FOOT - COMPLETE 3+ VIEW COMPARISON:  None. FINDINGS: Diffuse soft tissue swelling is noted, nonspecific but possibly representing stigmata of cellulitis, third spacing of fluid or possibly from chronic venous insufficiency. Bones are demineralized in appearance without frank bone destruction. Mild degenerative joint space narrowing is seen of the toes. No acute fracture joint dislocations. Dorsal calcaneal enthesophyte is seen. Ankle mortise and subtalar articulations are maintained as are the midfoot joints. IMPRESSION: 1. Osteopenic appearance of the left foot without fracture or frank bone destruction. 2. Soft tissue swelling of the included leg, ankle and foot. Electronically Signed   By: Tollie Eth M.D.   On: 03/05/2017 20:16   Dg Foot Complete Left  Result Date: 02/14/2017 CLINICAL DATA:  Bilateral foot pain and swelling with open wounds of the left foot x1 day. EXAM: LEFT FOOT - COMPLETE 3+ VIEW COMPARISON:  Left third and fourth toe radiographs from 01/16/2017 FINDINGS: Soft tissue ulcerations involving the third and fourth toes without underlying fracture nor apparent bone destruction. Joint spaces are intact. The bones are osteopenic. Dorsal calcaneal enthesophyte is noted. Vascular calcifications about the visualized ankle and foot consistent with diabetes. Diffuse generalized soft tissue edema and swelling of the included ankle and dorsum of the foot. IMPRESSION: Generalized soft tissue swelling of the included ankle and foot more so along the dorsum of the forefoot. Soft tissue ulcerations noted of the third and fourth toes without frank bone destruction, fracture nor dislocations. Electronically Signed   By: Tollie Eth M.D.   On: 02/14/2017 23:14   Dg Foot Complete Right  Result Date: 03/05/2017 CLINICAL DATA:  Possible infection. EXAM: RIGHT FOOT COMPLETE - 3+ VIEW COMPARISON:  None. FINDINGS: Diffuse soft tissue  swelling of the included leg, ankle and foot especially over the dorsum of the forefoot. No underlying bone destruction or fracture. No joint dislocations. Bones are demineralized in appearance. Dorsal calcaneal enthesophyte is seen. IMPRESSION: 1. Nonspecific soft tissue swelling  of the included leg, ankle and foot. Findings could be due to third spacing of fluid, cellulitis or possibly chronic venous insufficiency. 2. No underlying fracture. 3. No evidence of osteomyelitis. Electronically Signed   By: Ashley Royalty M.D.   On: 03/05/2017 20:19    Lab Data:  CBC:  Recent Labs Lab 03/10/17 0727 03/13/17 0840 03/14/17 0505 03/14/17 1832 03/15/17 0815  WBC 11.4* 11.2* 12.1* 13.1* 12.7*  HGB 12.1* 12.5* 12.2* 11.6* 10.7*  HCT 38.3* 39.6 39.6 38.3* 34.4*  MCV 85.9 85.7 86.8 88.2 87.3  PLT 326 300 223 205 947   Basic Metabolic Panel:  Recent Labs Lab 03/08/17 1730 03/10/17 0728 03/13/17 0840 03/14/17 0505 03/14/17 1832 03/15/17 0815  NA 135 135 132* 136 138 136  K 4.1 4.4 4.5 4.0 4.4 3.9  CL 96* 95* 94* 96* 98* 97*  CO2 '25 25 22 28 25 25  '$ GLUCOSE 96 99 112* 105* 110* 106*  BUN 47* 41* 49* 32* 36* 44*  CREATININE 8.15* 6.51* 7.37* 6.08* 6.39* 7.22*  CALCIUM 7.9* 8.6* 8.5* 8.7* 8.5* 8.4*  PHOS 4.2 4.1 5.0*  --  4.5 4.8*   GFR: Estimated Creatinine Clearance: 9.9 mL/min (A) (by C-G formula based on SCr of 7.22 mg/dL (H)). Liver Function Tests:  Recent Labs Lab 03/08/17 1730 03/10/17 0728 03/13/17 0840 03/14/17 1832 03/15/17 0815  ALBUMIN 2.3* 2.3* 2.3* 2.4* 2.4*   No results for input(s): LIPASE, AMYLASE in the last 168 hours. No results for input(s): AMMONIA in the last 168 hours. Coagulation Profile:  Recent Labs Lab 03/14/17 0505  INR 1.34   Cardiac Enzymes: No results for input(s): CKTOTAL, CKMB, CKMBINDEX, TROPONINI in the last 168 hours. BNP (last 3 results) No results for input(s): PROBNP in the last 8760 hours. HbA1C: No results for input(s): HGBA1C in  the last 72 hours. CBG: No results for input(s): GLUCAP in the last 168 hours. Lipid Profile: No results for input(s): CHOL, HDL, LDLCALC, TRIG, CHOLHDL, LDLDIRECT in the last 72 hours. Thyroid Function Tests: No results for input(s): TSH, T4TOTAL, FREET4, T3FREE, THYROIDAB in the last 72 hours. Anemia Panel: No results for input(s): VITAMINB12, FOLATE, FERRITIN, TIBC, IRON, RETICCTPCT in the last 72 hours. Urine analysis: No results found for: COLORURINE, APPEARANCEUR, LABSPEC, PHURINE, GLUCOSEU, HGBUR, BILIRUBINUR, KETONESUR, PROTEINUR, UROBILINOGEN, NITRITE, LEUKOCYTESUR   Shatana Saxton M.D. [facility] 03/15/2017, 3:40 PM  Pager: [phone] Between 7am to 7pm - call Pager - [phone]  After 7pm go to [email or web address] - password [email or web address]  Call night coverage person covering after 7pm

## 2017-03-15 NOTE — Progress Notes (Signed)
Patient ID: Willie Andrade, male   DOB: 30-Apr-1951, 66 y.o.   MRN: [id number] Reports pain all over. Nothing specifically in his left foot. His foot is warm with a palpable bag subcutaneous graft pulse. Dressings intact. Does have some drainage from his left groin incision as expected with a lymphocele.

## 2017-03-16 ENCOUNTER — Encounter: Payer: Medicare Other | Admitting: Family

## 2017-03-16 DIAGNOSIS — N186 End stage renal disease: Secondary | ICD-10-CM

## 2017-03-16 DIAGNOSIS — Z992 Dependence on renal dialysis: Secondary | ICD-10-CM

## 2017-03-16 MED ORDER — CALCITRIOL 0.5 MCG PO CAPS
1.2500 ug | ORAL_CAPSULE | ORAL | Status: DC
  Administered 2017-03-17 – 2017-03-22 (×3): 1.25 ug via ORAL
  Filled 2017-03-16 (×2): qty 1

## 2017-03-16 MED ORDER — MIDODRINE HCL 5 MG PO TABS
10.0000 mg | ORAL_TABLET | Freq: Three times a day (TID) | ORAL | Status: DC
  Administered 2017-03-16 – 2017-03-22 (×18): 10 mg via ORAL
  Filled 2017-03-16 (×16): qty 2

## 2017-03-16 MED ORDER — HYDROMORPHONE HCL 1 MG/ML IJ SOLN
1.0000 mg | INTRAMUSCULAR | Status: DC | PRN
  Administered 2017-03-17 – 2017-03-20 (×3): 1 mg via INTRAVENOUS
  Filled 2017-03-16 (×4): qty 1

## 2017-03-16 MED ORDER — SODIUM CHLORIDE 0.9% FLUSH
3.0000 mL | Freq: Two times a day (BID) | INTRAVENOUS | Status: DC
  Administered 2017-03-16 – 2017-03-21 (×9): 3 mL via INTRAVENOUS

## 2017-03-16 MED ORDER — ENOXAPARIN SODIUM 30 MG/0.3ML ~~LOC~~ SOLN
30.0000 mg | SUBCUTANEOUS | Status: DC
  Administered 2017-03-16 – 2017-03-22 (×4): 30 mg via SUBCUTANEOUS
  Filled 2017-03-16 (×5): qty 0.3

## 2017-03-16 MED ORDER — CINACALCET HCL 30 MG PO TABS
90.0000 mg | ORAL_TABLET | ORAL | Status: DC
  Administered 2017-03-17 – 2017-03-20 (×2): 90 mg via ORAL
  Filled 2017-03-16 (×3): qty 3

## 2017-03-16 MED ORDER — ACETAMINOPHEN 325 MG PO TABS
650.0000 mg | ORAL_TABLET | Freq: Four times a day (QID) | ORAL | Status: DC | PRN
  Administered 2017-03-17: 650 mg via ORAL

## 2017-03-16 MED ORDER — CALCIUM ACETATE (PHOS BINDER) 667 MG PO CAPS
2001.0000 mg | ORAL_CAPSULE | Freq: Three times a day (TID) | ORAL | Status: DC
  Administered 2017-03-16 – 2017-03-22 (×15): 2001 mg via ORAL
  Filled 2017-03-16 (×16): qty 3

## 2017-03-16 MED ORDER — ONDANSETRON HCL 4 MG PO TABS: 4.0000 mg | ORAL_TABLET | Freq: Four times a day (QID) | ORAL | Status: DC | PRN

## 2017-03-16 MED ORDER — ONDANSETRON HCL 4 MG/2ML IJ SOLN: 4.0000 mg | Freq: Four times a day (QID) | INTRAMUSCULAR | Status: DC | PRN

## 2017-03-16 MED ORDER — RENA-VITE PO TABS
1.0000 | ORAL_TABLET | Freq: Every day | ORAL | Status: DC
  Administered 2017-03-16 – 2017-03-22 (×6): 1 via ORAL
  Filled 2017-03-16 (×5): qty 1

## 2017-03-16 MED ORDER — ACETAMINOPHEN 650 MG RE SUPP: 650.0000 mg | Freq: Four times a day (QID) | RECTAL | Status: DC | PRN

## 2017-03-16 MED ORDER — SENNA 8.6 MG PO TABS
1.0000 | ORAL_TABLET | Freq: Two times a day (BID) | ORAL | Status: DC
  Administered 2017-03-16 – 2017-03-22 (×11): 8.6 mg via ORAL
  Filled 2017-03-16 (×11): qty 1

## 2017-03-16 MED ORDER — GABAPENTIN 100 MG PO CAPS
100.0000 mg | ORAL_CAPSULE | Freq: Two times a day (BID) | ORAL | Status: DC
  Administered 2017-03-16 – 2017-03-22 (×11): 100 mg via ORAL
  Filled 2017-03-16 (×11): qty 1

## 2017-03-16 MED ORDER — HYDROMORPHONE HCL 1 MG/ML IJ SOLN
0.5000 mg | Freq: Three times a day (TID) | INTRAMUSCULAR | Status: DC | PRN
  Administered 2017-03-16: 0.5 mg via INTRAVENOUS
  Filled 2017-03-16: qty 0.5

## 2017-03-16 MED ORDER — OXYCODONE-ACETAMINOPHEN 5-325 MG PO TABS
1.0000 | ORAL_TABLET | Freq: Four times a day (QID) | ORAL | Status: DC | PRN
  Administered 2017-03-16 – 2017-03-22 (×20): 2 via ORAL
  Filled 2017-03-16: qty 1
  Filled 2017-03-16 (×6): qty 2
  Filled 2017-03-16: qty 1
  Filled 2017-03-16 (×4): qty 2
  Filled 2017-03-16: qty 1
  Filled 2017-03-16: qty 2
  Filled 2017-03-16: qty 1
  Filled 2017-03-16 (×4): qty 2

## 2017-03-16 MED ORDER — DEXTROSE 5 % IV SOLN
1.0000 g | INTRAVENOUS | Status: AC
  Administered 2017-03-16 – 2017-03-19 (×4): 1 g via INTRAVENOUS
  Filled 2017-03-16 (×4): qty 1

## 2017-03-16 NOTE — Progress Notes (Signed)
Marland Kitchen           [facility]                                                                              Patient Demographics  Willie Andrade, is a 66 y.o. male, DOB - 1951-05-11, LZJ:[id number]  Admit date - 03/05/2017   Admitting Physician Toy Baker, MD  Outpatient Primary MD for the patient is Lucianne Lei, MD  Outpatient specialists:   LOS - 10  days   Medical records reviewed and are as summarized below:    Chief Complaint  Patient presents with  . Abscess  . 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 No worsening drainage from his left toes. In the ED, WBC 24K, high lactate and pro-calcitonin, clinical picture consistent with cellulitis, no osteomyelitis. He was seen by home health RN who felt that his wounds were worse and was advised to come to the ED. Empirically started on IV vancomycin. Vascular surgery and nephrology were consulted.    Assessment & Plan     Recurrent left groin lymphocele - Status post excision of the lymphocele on 8/8 and drain in place which is still draining copious amounts - Vascular surgery following, operative cultures grew pseudomonas and patient placed on IV Fortaz, 10-14 days or per vascular surgery - cultures 8/8 showed Pseudomonas, recent cultures from 8/15 showing rare gram-negative rods, will continue IV Fortaz for 14 days. Will discuss with ID in a.m.  - Per vascular surgery continue wet-to-dry dressings to open  incisions    PAD severe with left foot gangrene/purulent drainage from the left leg saphenectomy site - Vascular surgery following closely, left leg bypass is patent - Status post debridement of incision and opening and debridement of proximal thigh incision and removal of staples and debridement of left groin incision  - Severe PAD with left foot gangrene, patient has been declining amputations, has chronic and severe pain    ESRD (end stage renal disease) ([place]) - Nephrology consulted - on hemodialysis TTS    Pacemaker, Chronic systolic heart failure ([place]), 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  - Blood cultures 2 negative. Vancomycin has been discontinued.  - Secondary to lower extremity wound infections, #1 and #2    Severe protein-calorie malnutrition ([place]) - Nutrition consulted  Code Status: Full code DVT Prophylaxis:  Lovenox  Family Communication: Discussed in detail with the patient, all imaging results, lab results explained to the patient   Disposition Plan: He  may need to go to skilled nursing facility given dressing changes he will need, severe PAD  Time Spent in minutes 15 minutes  Procedures:    Consultants:   Nephrology   vascular surgery  Antimicrobials:      Medications  Scheduled Meds: . [START ON 03/17/2017] calcitRIOL  1.25 mcg Oral Q T,Th,Sat-1800  . calcium acetate  2,001 mg Oral TID WC  . [START ON 03/17/2017] cinacalcet  90 mg Oral Q T,Th,Sat-1800  . enoxaparin (LOVENOX) injection  30 mg Subcutaneous Q24H  . feeding supplement (PRO-STAT SUGAR FREE 64)  30 mL Oral BID  . gabapentin  100 mg Oral BID  . midodrine  10 mg Oral TID WC  . multivitamin  1 tablet Oral QHS  . senna  1 tablet Oral BID  . sodium chloride flush   3 mL Intravenous Q12H   Continuous Infusions: . cefTAZidime (FORTAZ)  IV     PRN Meds:.acetaminophen **OR** acetaminophen, HYDROmorphone (DILAUDID) injection, ondansetron **OR** ondansetron (ZOFRAN) IV, oxyCODONE-acetaminophen   Antibiotics   Anti-infectives    Start     Dose/Rate Route Frequency Ordered Stop   03/16/17 1800  cefTAZidime (FORTAZ) 1 g in dextrose 5 % 50 mL IVPB     1 g 100 mL/hr over 30 Minutes Intravenous Every 24 hours 03/16/17 0411     03/09/17 1830  cefTAZidime (FORTAZ) 1 g in dextrose 5 % 50 mL IVPB  Status:  Discontinued     1 g 100 mL/hr over 30 Minutes Intravenous Every 24 hours 03/09/17 1807 03/16/17 0334   03/07/17 0600  ceFAZolin (ANCEF) IVPB 1 g/50 mL premix  Status:  Discontinued    Comments:  Send with pt to OR   1 g 100 mL/hr over 30 Minutes Intravenous To Surgery 03/06/17 1021 03/07/17 1118   03/06/17 2018  Vancomycin (VANCOCIN) 750-5 MG/150ML-% IVPB    Comments:  Kiang, Angelito   : cabinet override      03/06/17 2018 03/06/17 2130   03/06/17 1200  vancomycin (VANCOCIN) IVPB 750 mg/150 ml premix  Status:  Discontinued     750 mg 150 mL/hr over 60 Minutes Intravenous Every T-Th-Sa (Hemodialysis) 03/06/17 0130 03/09/17 1755   03/05/17 2345  vancomycin (VANCOCIN) 1,500 mg in sodium chloride 0.9 % 500 mL IVPB     1,500 mg 250 mL/hr over 120 Minutes Intravenous  Once 03/05/17 2342 03/06/17 0229        Subjective:   Harace Cifelli was seen and examined today.Asking for pain medications, dressing change on the left groin was falling off, patient feeling miserable, pain in the left feet and both toes 8/10. No fevers. Denies denies dizziness, chest pain, shortness of breath, abdominal pain, N/V/D/C.  Objective:   Vitals:   03/15/17 1809 03/15/17 2207 03/16/17 0534 03/16/17 1100  BP: (!) 107/58 100/64 109/73 98/62  Pulse: 78 83 91 71  Resp: _0 Temp: 98.6 F (37 C) 97.8 F (36.6 C) 97.6 F (36.4 C) 98 F (36.7 C)  TempSrc: Oral Oral  Oral Oral  SpO2: 98% 99% (!) 87% 98%  Weight:  70.5 kg (155 lb 8 oz)    Height:        Intake/Output Summary (Last 24 hours) at 03/16/17 1457 Last data filed at 03/16/17 0900  Gross per 24 hour  Intake              720 ml  Output  0 ml  Net              720 ml     Wt Readings from Last 3 Encounters:  03/15/17 70.5 kg (155 lb 8 oz)  02/27/17 72.6 kg (160 lb)  02/13/17 74.4 kg (164 lb 0.4 oz)     Exam  General: Alert and oriented uncomfortable Eyes:  HEENT:   Cardiovascular: S1 S2 auscultated, no rubs, murmurs or gallops. Regular rate and rhythm.  Respiratory: Clear to auscultation bilaterally, no wheezing, rales or rhonchi Gastrointestinal: Soft, nontender, nondistended, + bowel sounds Ext: dressing intact in the left groin area. , Dry gangrene changes of the b/l toes Neuro: no new deficits  Musculoskeletal: No digital cyanosis, clubbing Skin: No rashes Psychuncomfortable due to pain     Data Reviewed:  I have personally reviewed following labs and imaging studies  Micro Results Recent Results (from the past 240 hour(s))  Surgical pcr screen     Status: None   Collection Time: 03/07/17  5:59 AM  Result Value Ref Range Status   MRSA, PCR NEGATIVE NEGATIVE Final   Staphylococcus aureus NEGATIVE NEGATIVE Final    Comment:        The Xpert SA Assay (FDA approved for NASAL specimens in patients over 34 years of age), is one component of a comprehensive surveillance program.  Test performance has been validated by [facility] for patients greater than or equal to 109 year old. It is not intended to diagnose infection nor to guide or monitor treatment.   Aerobic/Anaerobic Culture (surgical/deep wound)     Status: None   Collection Time: 03/07/17  9:15 AM  Result Value Ref Range Status   Specimen Description GROIN  Final   Special Requests LEFT  Final   Gram Stain   Final    FEW WBC PRESENT,BOTH PMN AND MONONUCLEAR NO ORGANISMS SEEN    Culture    Final    RARE PSEUDOMONAS AERUGINOSA NO ANAEROBES ISOLATED    Report Status 03/12/2017 FINAL  Final   Organism ID, Bacteria PSEUDOMONAS AERUGINOSA  Final      Susceptibility   Pseudomonas aeruginosa - MIC*    CEFTAZIDIME 4 SENSITIVE Sensitive     CIPROFLOXACIN <=0.25 SENSITIVE Sensitive     GENTAMICIN <=1 SENSITIVE Sensitive     IMIPENEM 2 SENSITIVE Sensitive     PIP/TAZO 8 SENSITIVE Sensitive     CEFEPIME 2 SENSITIVE Sensitive     * RARE PSEUDOMONAS AERUGINOSA  Anaerobic culture     Status: None (Preliminary result)   Collection Time: 03/14/17 11:32 AM  Result Value Ref Range Status   Specimen Description WOUND LEFT THIGH  Final   Special Requests VEIN HARVEST INCISION POF FORTAZ  Final   Culture   Final    NO ANAEROBES ISOLATED; CULTURE IN PROGRESS FOR 5 DAYS   Report Status PENDING  Incomplete  Aerobic Culture (superficial specimen)     Status: None (Preliminary result)   Collection Time: 03/14/17 11:32 AM  Result Value Ref Range Status   Specimen Description WOUND LEFT THIGH  Final   Special Requests VEIN HARVEST INCISION POF FORTAZ  Final   Gram Stain   Final    RARE WBC PRESENT,BOTH PMN AND MONONUCLEAR NO ORGANISMS SEEN    Culture   Final    RARE GRAM NEGATIVE RODS IDENTIFICATION AND SUSCEPTIBILITIES TO FOLLOW    Report Status PENDING  Incomplete    Radiology Reports Dg Chest 2 View  Result Date: 03/05/2017 CLINICAL DATA:  Bilateral foot wounds, hypotension and elevated lactic acid level. EXAM: CHEST  2 VIEW COMPARISON:  02/11/2017 FINDINGS: Stable moderate cardiac enlargement and radiographic appearance of a biventricular pacing/ICD device. There is no evidence of pulmonary edema, consolidation, pneumothorax, nodule or pleural fluid. The bony thorax is unremarkable. IMPRESSION: Stable cardiomegaly.  No acute findings in the chest. Electronically Signed   By: Aletta Edouard M.D.   On: 03/05/2017 18:20   Dg Ankle Complete Left  Result Date: 03/05/2017 CLINICAL  DATA:  Possible infection.  Soft tissue swelling. EXAM: LEFT ANKLE COMPLETE - 3+ VIEW COMPARISON:  None. FINDINGS: The bones are demineralized. There is no acute fracture dislocations involving the left ankle. Dorsal calcaneal enthesophyte is noted. The ankle and subtalar joints are maintained. Vascular calcifications are noted crossing the ankle joint which can be seen with diabetes. Diffuse soft tissue swelling and thickening is noted which may reflect chronic venous insufficiency, third spacing of fluid or possibly stigmata of a cellulitis. No bone destruction is noted. IMPRESSION: 1. Nonspecific diffuse soft tissue swelling of the included left leg and ankle possibly representing chronic venous insufficiency, third spacing of fluid or possibly cellulitis. 2. No evidence of osteomyelitis. 3. Osteopenia. Electronically Signed   By: Ashley Royalty M.D.   On: 03/05/2017 20:13   Dg Foot 2 Views Right  Result Date: 02/14/2017 CLINICAL DATA:  Bilateral foot pain.  Open wound on left foot. EXAM: RIGHT FOOT - 2 VIEW COMPARISON:  None. FINDINGS: There is moderate, diffuse soft tissue swelling of the left foot. There is no osteolysis. No soft tissue emphysema. Dorsal vascular calcifications are noted. IMPRESSION: Diffuse soft tissue swelling of the left foot. No evidence of active osteomyelitis. Electronically Signed   By: Ulyses Jarred M.D.   On: 02/14/2017 23:13   Dg Foot Complete Left  Result Date: 03/05/2017 CLINICAL DATA:  Possible infection EXAM: LEFT FOOT - COMPLETE 3+ VIEW COMPARISON:  None. FINDINGS: Diffuse soft tissue swelling is noted, nonspecific but possibly representing stigmata of cellulitis, third spacing of fluid or possibly from chronic venous insufficiency. Bones are demineralized in appearance without frank bone destruction. Mild degenerative joint space narrowing is seen of the toes. No acute fracture joint dislocations. Dorsal calcaneal enthesophyte is seen. Ankle mortise and subtalar  articulations are maintained as are the midfoot joints. IMPRESSION: 1. Osteopenic appearance of the left foot without fracture or frank bone destruction. 2. Soft tissue swelling of the included leg, ankle and foot. Electronically Signed   By: Ashley Royalty M.D.   On: 03/05/2017 20:16   Dg Foot Complete Left  Result Date: 02/14/2017 CLINICAL DATA:  Bilateral foot pain and swelling with open wounds of the left foot x1 day. EXAM: LEFT FOOT - COMPLETE 3+ VIEW COMPARISON:  Left third and fourth toe radiographs from 01/16/2017 FINDINGS: Soft tissue ulcerations involving the third and fourth toes without underlying fracture nor apparent bone destruction. Joint spaces are intact. The bones are osteopenic. Dorsal calcaneal enthesophyte is noted. Vascular calcifications about the visualized ankle and foot consistent with diabetes. Diffuse generalized soft tissue edema and swelling of the included ankle and dorsum of the foot. IMPRESSION: Generalized soft tissue swelling of the included ankle and foot more so along the dorsum of the forefoot. Soft tissue ulcerations noted of the third and fourth toes without frank bone destruction, fracture nor dislocations. Electronically Signed   By: Ashley Royalty M.D.   On: 02/14/2017 23:14   Dg Foot Complete Right  Result Date: 03/05/2017 CLINICAL DATA:  Possible infection.  EXAM: RIGHT FOOT COMPLETE - 3+ VIEW COMPARISON:  None. FINDINGS: Diffuse soft tissue swelling of the included leg, ankle and foot especially over the dorsum of the forefoot. No underlying bone destruction or fracture. No joint dislocations. Bones are demineralized in appearance. Dorsal calcaneal enthesophyte is seen. IMPRESSION: 1. Nonspecific soft tissue swelling of the included leg, ankle and foot. Findings could be due to third spacing of fluid, cellulitis or possibly chronic venous insufficiency. 2. No underlying fracture. 3. No evidence of osteomyelitis. Electronically Signed   By: Ashley Royalty M.D.   On:  03/05/2017 20:19    Lab Data:  CBC:  Recent Labs Lab 03/10/17 0727 03/13/17 0840 03/14/17 0505 03/14/17 1832 03/15/17 0815  WBC 11.4* 11.2* 12.1* 13.1* 12.7*  HGB 12.1* 12.5* 12.2* 11.6* 10.7*  HCT 38.3* 39.6 39.6 38.3* 34.4*  MCV 85.9 85.7 86.8 88.2 87.3  PLT 326 300 223 205 641   Basic Metabolic Panel:  Recent Labs Lab 03/10/17 0728 03/13/17 0840 03/14/17 0505 03/14/17 1832 03/15/17 0815  NA 135 132* 136 138 136  K 4.4 4.5 4.0 4.4 3.9  CL 95* 94* 96* 98* 97*  CO2 _0 GLUCOSE 99 112* 105* 110* 106*  BUN 41* 49* 32* 36* 44*  CREATININE 6.51* 7.37* 6.08* 6.39* 7.22*  CALCIUM 8.6* 8.5* 8.7* 8.5* 8.4*  PHOS 4.1 5.0*  --  4.5 4.8*   GFR: Estimated Creatinine Clearance: 10 mL/min (A) (by C-G formula based on SCr of 7.22 mg/dL (H)). Liver Function Tests:  Recent Labs Lab 03/10/17 0728 03/13/17 0840 03/14/17 1832 03/15/17 0815  ALBUMIN 2.3* 2.3* 2.4* 2.4*   No results for input(s): LIPASE, AMYLASE in the last 168 hours. No results for input(s): AMMONIA in the last 168 hours. Coagulation Profile:  Recent Labs Lab 03/14/17 0505  INR 1.34   Cardiac Enzymes: No results for input(s): CKTOTAL, CKMB, CKMBINDEX, TROPONINI in the last 168 hours. BNP (last 3 results) No results for input(s): PROBNP in the last 8760 hours. HbA1C: No results for input(s): HGBA1C in the last 72 hours. CBG: No results for input(s): GLUCAP in the last 168 hours. Lipid Profile: No results for input(s): CHOL, HDL, LDLCALC, TRIG, CHOLHDL, LDLDIRECT in the last 72 hours. Thyroid Function Tests: No results for input(s): TSH, T4TOTAL, FREET4, T3FREE, THYROIDAB in the last 72 hours. Anemia Panel: No results for input(s): VITAMINB12, FOLATE, FERRITIN, TIBC, IRON, RETICCTPCT in the last 72 hours. Urine analysis: No results found for: COLORURINE, APPEARANCEUR, LABSPEC, PHURINE, GLUCOSEU, HGBUR, BILIRUBINUR, KETONESUR, PROTEINUR, UROBILINOGEN, NITRITE, LEUKOCYTESUR   Estill Llerena M.D. [facility] 03/16/2017, 2:57 PM  Pager: [phone] Between 7am to 7pm - call Pager - [phone]  After 7pm go to [email or web address] - password [email or web address]  Call night coverage person covering after 7pm  .           [facility]                                                                              Patient Demographics  Willie Andrade, is a 66 y.o. male, DOB - Dec 18, 1950, RAX:[id number]  Admit date - 03/05/2017   Admitting Physician Toy Baker, MD  Outpatient Primary MD for the patient  is Lucianne Lei, MD  Outpatient specialists:   LOS - 10  days   Medical records reviewed and are as summarized below:    Chief Complaint  Patient presents with  . Abscess  . 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 No worsening drainage from his left toes. In the ED, WBC 24K, high lactate and pro-calcitonin, clinical picture consistent with cellulitis, no osteomyelitis. He was seen by home health RN who felt that his wounds were worse and was advised to come to the ED. Empirically started on IV vancomycin. Vascular surgery and nephrology were consulted.    Assessment & Plan     Recurrent left groin lymphocele - Status post excision of the lymphocele on 8/8 and drain in place which is still draining copious amounts - Vascular surgery following, operative cultures grew pseudomonas and patient placed on IV Fortaz, 10-14 days or per vascular surgery    PAD severe  with left foot gangrene/purulent drainage from the left leg saphenectomy site - Vascular surgery following closely, left leg bypass is patent - Status post debridement of incision and opening and debridement of proximal thigh incision and removal of staples and debridement of left groin incision today    ESRD (end stage renal disease) ([place]) - Nephrology consulted - on hemodialysis TTS    Pacemaker, Chronic systolic heart failure (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 Blood cultures 2 negative. Vancomycin has been discontinued.  - Secondary to lower extremity wound infections, #1 and #2    Severe protein-calorie malnutrition ([place]) - Nutrition consulted  Code Status: Full code DVT Prophylaxis:  Lovenox  Family Communication: Discussed in detail with the patient, all imaging results, lab results explained to the patient   Disposition Plan:   Time Spent in minutes 25 minutes  Procedures:    Consultants:   Nephrology   vascular surgery  Antimicrobials:      Medications  Scheduled Meds: . [START ON 03/17/2017] calcitRIOL  1.25 mcg Oral Q T,Th,Sat-1800  . calcium acetate  2,001 mg Oral TID WC  . [START ON 03/17/2017] cinacalcet  90 mg Oral Q T,Th,Sat-1800  . enoxaparin (LOVENOX) injection  30 mg Subcutaneous Q24H  . feeding supplement (PRO-STAT SUGAR FREE 64)  30 mL Oral BID  . gabapentin  100 mg Oral BID  . midodrine  10 mg Oral TID WC  . multivitamin  1 tablet Oral QHS  . senna  1 tablet Oral BID  . sodium chloride flush  3 mL Intravenous Q12H   Continuous Infusions: . cefTAZidime (FORTAZ)  IV     PRN Meds:.acetaminophen **OR** acetaminophen, HYDROmorphone (DILAUDID) injection, ondansetron **OR** ondansetron (ZOFRAN) IV,  oxyCODONE-acetaminophen   Antibiotics   Anti-infectives    Start     Dose/Rate Route Frequency Ordered Stop   03/16/17  1800  cefTAZidime (FORTAZ) 1 g in dextrose 5 % 50 mL IVPB     1 g 100 mL/hr over 30 Minutes Intravenous Every 24 hours 03/16/17 0411     03/09/17 1830  cefTAZidime (FORTAZ) 1 g in dextrose 5 % 50 mL IVPB  Status:  Discontinued     1 g 100 mL/hr over 30 Minutes Intravenous Every 24 hours 03/09/17 1807 03/16/17 0334   03/07/17 0600  ceFAZolin (ANCEF) IVPB 1 g/50 mL premix  Status:  Discontinued    Comments:  Send with pt to OR   1 g 100 mL/hr over 30 Minutes Intravenous To Surgery 03/06/17 1021 03/07/17 1118   03/06/17 2018  Vancomycin (VANCOCIN) 750-5 MG/150ML-% IVPB    Comments:  Kiang, Angelito   : cabinet override      03/06/17 2018 03/06/17 2130   03/06/17 1200  vancomycin (VANCOCIN) IVPB 750 mg/150 ml premix  Status:  Discontinued     750 mg 150 mL/hr over 60 Minutes Intravenous Every T-Th-Sa (Hemodialysis) 03/06/17 0130 03/09/17 1755   03/05/17 2345  vancomycin (VANCOCIN) 1,500 mg in sodium chloride 0.9 % 500 mL IVPB     1,500 mg 250 mL/hr over 120 Minutes Intravenous  Once 03/05/17 2342 03/06/17 0229        Subjective:   Lando Wescoat was seen and examined today. Seen after the surgery. Patient denies dizziness, chest pain, shortness of breath, abdominal pain, N/V/D/C, new weakness, numbess, tingling. No acute events overnight.    Objective:   Vitals:   03/15/17 1809 03/15/17 2207 03/16/17 0534 03/16/17 1100  BP: (!) 107/58 100/64 109/73 98/62  Pulse: 78 83 91 71  Resp: _0 Temp: 98.6 F (37 C) 97.8 F (36.6 C) 97.6 F (36.4 C) 98 F (36.7 C)  TempSrc: Oral Oral Oral Oral  SpO2: 98% 99% (!) 87% 98%  Weight:  70.5 kg (155 lb 8 oz)    Height:        Intake/Output Summary (Last 24 hours) at 03/16/17 1457 Last data filed at 03/16/17 0900  Gross per 24 hour  Intake              720 ml  Output                0 ml  Net               720 ml     Wt Readings from Last 3 Encounters:  03/15/17 70.5 kg (155 lb 8 oz)  02/27/17 72.6 kg (160 lb)  02/13/17 74.4 kg (164 lb 0.4 oz)     Exam  General: Alert and oriented, NAD, appears comfortable   Eyes:   HEENT:    Cardiovascular: S1 S2 auscultated, no rubs, murmurs or gallops. Regular rate and rhythm.  Respiratory: Clear to auscultation bilaterally, no wheezing, rales or rhonchi  Gastrointestinal: Soft, nontender, nondistended, + bowel sounds  Ext: chronic dry changes in left toe and foot  Neuro:  No FND  Musculoskeletal: No digital cyanosis, clubbing  Skin: No rashes  Psych: alert and comfortable    Data Reviewed:  I have personally reviewed following labs and imaging studies  Micro Results Recent Results (from the past 240 hour(s))  Surgical pcr screen     Status: None   Collection Time: 03/07/17  5:59 AM  Result Value Ref Range Status   MRSA, PCR NEGATIVE NEGATIVE Final   Staphylococcus aureus NEGATIVE NEGATIVE Final    Comment:  The Xpert SA Assay (FDA approved for NASAL specimens in patients over 55 years of age), is one component of a comprehensive surveillance program.  Test performance has been validated by [facility] for patients greater than or equal to 52 year old. It is not intended to diagnose infection nor to guide or monitor treatment.   Aerobic/Anaerobic Culture (surgical/deep wound)     Status: None   Collection Time: 03/07/17  9:15 AM  Result Value Ref Range Status   Specimen Description GROIN  Final   Special Requests LEFT  Final   Gram Stain   Final    FEW WBC PRESENT,BOTH PMN AND MONONUCLEAR NO ORGANISMS SEEN    Culture   Final    RARE PSEUDOMONAS AERUGINOSA NO ANAEROBES ISOLATED    Report Status 03/12/2017 FINAL  Final   Organism ID, Bacteria PSEUDOMONAS AERUGINOSA  Final      Susceptibility   Pseudomonas aeruginosa - MIC*    CEFTAZIDIME 4 SENSITIVE Sensitive     CIPROFLOXACIN <=0.25 SENSITIVE Sensitive      GENTAMICIN <=1 SENSITIVE Sensitive     IMIPENEM 2 SENSITIVE Sensitive     PIP/TAZO 8 SENSITIVE Sensitive     CEFEPIME 2 SENSITIVE Sensitive     * RARE PSEUDOMONAS AERUGINOSA  Anaerobic culture     Status: None (Preliminary result)   Collection Time: 03/14/17 11:32 AM  Result Value Ref Range Status   Specimen Description WOUND LEFT THIGH  Final   Special Requests VEIN HARVEST INCISION POF FORTAZ  Final   Culture   Final    NO ANAEROBES ISOLATED; CULTURE IN PROGRESS FOR 5 DAYS   Report Status PENDING  Incomplete  Aerobic Culture (superficial specimen)     Status: None (Preliminary result)   Collection Time: 03/14/17 11:32 AM  Result Value Ref Range Status   Specimen Description WOUND LEFT THIGH  Final   Special Requests VEIN HARVEST INCISION POF FORTAZ  Final   Gram Stain   Final    RARE WBC PRESENT,BOTH PMN AND MONONUCLEAR NO ORGANISMS SEEN    Culture   Final    RARE GRAM NEGATIVE RODS IDENTIFICATION AND SUSCEPTIBILITIES TO FOLLOW    Report Status PENDING  Incomplete    Radiology Reports Dg Chest 2 View  Result Date: 03/05/2017 CLINICAL DATA:  Bilateral foot wounds, hypotension and elevated lactic acid level. EXAM: CHEST  2 VIEW COMPARISON:  02/11/2017 FINDINGS: Stable moderate cardiac enlargement and radiographic appearance of a biventricular pacing/ICD device. There is no evidence of pulmonary edema, consolidation, pneumothorax, nodule or pleural fluid. The bony thorax is unremarkable. IMPRESSION: Stable cardiomegaly.  No acute findings in the chest. Electronically Signed   By: Aletta Edouard M.D.   On: 03/05/2017 18:20   Dg Ankle Complete Left  Result Date: 03/05/2017 CLINICAL DATA:  Possible infection.  Soft tissue swelling. EXAM: LEFT ANKLE COMPLETE - 3+ VIEW COMPARISON:  None. FINDINGS: The bones are demineralized. There is no acute fracture dislocations involving the left ankle. Dorsal calcaneal enthesophyte is noted. The ankle and subtalar joints are maintained.  Vascular calcifications are noted crossing the ankle joint which can be seen with diabetes. Diffuse soft tissue swelling and thickening is noted which may reflect chronic venous insufficiency, third spacing of fluid or possibly stigmata of a cellulitis. No bone destruction is noted. IMPRESSION: 1. Nonspecific diffuse soft tissue swelling of the included left leg and ankle possibly representing chronic venous insufficiency, third spacing of fluid or possibly cellulitis. 2. No evidence of osteomyelitis. 3. Osteopenia. Electronically  Signed   By: Ashley Royalty M.D.   On: 03/05/2017 20:13   Dg Foot 2 Views Right  Result Date: 02/14/2017 CLINICAL DATA:  Bilateral foot pain.  Open wound on left foot. EXAM: RIGHT FOOT - 2 VIEW COMPARISON:  None. FINDINGS: There is moderate, diffuse soft tissue swelling of the left foot. There is no osteolysis. No soft tissue emphysema. Dorsal vascular calcifications are noted. IMPRESSION: Diffuse soft tissue swelling of the left foot. No evidence of active osteomyelitis. Electronically Signed   By: Ulyses Jarred M.D.   On: 02/14/2017 23:13   Dg Foot Complete Left  Result Date: 03/05/2017 CLINICAL DATA:  Possible infection EXAM: LEFT FOOT - COMPLETE 3+ VIEW COMPARISON:  None. FINDINGS: Diffuse soft tissue swelling is noted, nonspecific but possibly representing stigmata of cellulitis, third spacing of fluid or possibly from chronic venous insufficiency. Bones are demineralized in appearance without frank bone destruction. Mild degenerative joint space narrowing is seen of the toes. No acute fracture joint dislocations. Dorsal calcaneal enthesophyte is seen. Ankle mortise and subtalar articulations are maintained as are the midfoot joints. IMPRESSION: 1. Osteopenic appearance of the left foot without fracture or frank bone destruction. 2. Soft tissue swelling of the included leg, ankle and foot. Electronically Signed   By: Ashley Royalty M.D.   On: 03/05/2017 20:16   Dg Foot Complete  Left  Result Date: 02/14/2017 CLINICAL DATA:  Bilateral foot pain and swelling with open wounds of the left foot x1 day. EXAM: LEFT FOOT - COMPLETE 3+ VIEW COMPARISON:  Left third and fourth toe radiographs from 01/16/2017 FINDINGS: Soft tissue ulcerations involving the third and fourth toes without underlying fracture nor apparent bone destruction. Joint spaces are intact. The bones are osteopenic. Dorsal calcaneal enthesophyte is noted. Vascular calcifications about the visualized ankle and foot consistent with diabetes. Diffuse generalized soft tissue edema and swelling of the included ankle and dorsum of the foot. IMPRESSION: Generalized soft tissue swelling of the included ankle and foot more so along the dorsum of the forefoot. Soft tissue ulcerations noted of the third and fourth toes without frank bone destruction, fracture nor dislocations. Electronically Signed   By: Ashley Royalty M.D.   On: 02/14/2017 23:14   Dg Foot Complete Right  Result Date: 03/05/2017 CLINICAL DATA:  Possible infection. EXAM: RIGHT FOOT COMPLETE - 3+ VIEW COMPARISON:  None. FINDINGS: Diffuse soft tissue swelling of the included leg, ankle and foot especially over the dorsum of the forefoot. No underlying bone destruction or fracture. No joint dislocations. Bones are demineralized in appearance. Dorsal calcaneal enthesophyte is seen. IMPRESSION: 1. Nonspecific soft tissue swelling of the included leg, ankle and foot. Findings could be due to third spacing of fluid, cellulitis or possibly chronic venous insufficiency. 2. No underlying fracture. 3. No evidence of osteomyelitis. Electronically Signed   By: Ashley Royalty M.D.   On: 03/05/2017 20:19    Lab Data:  CBC:  Recent Labs Lab 03/10/17 0727 03/13/17 0840 03/14/17 0505 03/14/17 1832 03/15/17 0815  WBC 11.4* 11.2* 12.1* 13.1* 12.7*  HGB 12.1* 12.5* 12.2* 11.6* 10.7*  HCT 38.3* 39.6 39.6 38.3* 34.4*  MCV 85.9 85.7 86.8 88.2 87.3  PLT 326 300 223 205 161   Basic  Metabolic Panel:  Recent Labs Lab 03/10/17 0728 03/13/17 0840 03/14/17 0505 03/14/17 1832 03/15/17 0815  NA 135 132* 136 138 136  K 4.4 4.5 4.0 4.4 3.9  CL 95* 94* 96* 98* 97*  CO2 _0 GLUCOSE 99  112* 105* 110* 106*  BUN 41* 49* 32* 36* 44*  CREATININE 6.51* 7.37* 6.08* 6.39* 7.22*  CALCIUM 8.6* 8.5* 8.7* 8.5* 8.4*  PHOS 4.1 5.0*  --  4.5 4.8*   GFR: Estimated Creatinine Clearance: 10 mL/min (A) (by C-G formula based on SCr of 7.22 mg/dL (H)). Liver Function Tests:  Recent Labs Lab 03/10/17 0728 03/13/17 0840 03/14/17 1832 03/15/17 0815  ALBUMIN 2.3* 2.3* 2.4* 2.4*   No results for input(s): LIPASE, AMYLASE in the last 168 hours. No results for input(s): AMMONIA in the last 168 hours. Coagulation Profile:  Recent Labs Lab 03/14/17 0505  INR 1.34   Cardiac Enzymes: No results for input(s): CKTOTAL, CKMB, CKMBINDEX, TROPONINI in the last 168 hours. BNP (last 3 results) No results for input(s): PROBNP in the last 8760 hours. HbA1C: No results for input(s): HGBA1C in the last 72 hours. CBG: No results for input(s): GLUCAP in the last 168 hours. Lipid Profile: No results for input(s): CHOL, HDL, LDLCALC, TRIG, CHOLHDL, LDLDIRECT in the last 72 hours. Thyroid Function Tests: No results for input(s): TSH, T4TOTAL, FREET4, T3FREE, THYROIDAB in the last 72 hours. Anemia Panel: No results for input(s): VITAMINB12, FOLATE, FERRITIN, TIBC, IRON, RETICCTPCT in the last 72 hours. Urine analysis: No results found for: COLORURINE, APPEARANCEUR, LABSPEC, PHURINE, GLUCOSEU, HGBUR, BILIRUBINUR, KETONESUR, PROTEINUR, UROBILINOGEN, NITRITE, LEUKOCYTESUR   Tremell Reimers M.D. [facility] 03/16/2017, 2:57 PM  Pager: [phone] Between 7am to 7pm - call Pager - [phone]  After 7pm go to [email or web address] - password [email or web address]  Call night coverage person covering after 7pm

## 2017-03-16 NOTE — Progress Notes (Signed)
[facility] Progress Note   Subjective:  Seen in room. Asking for pain meds. L foot, groin pain. HD yesterday with no issues.   Objective Vitals:   03/15/17 1306 03/15/17 1809 03/15/17 2207 03/16/17 0534  BP: 98/63 (!) 107/58 100/64 109/73  Pulse: 81 78 83 91  Resp: 18 18 18 18   Temp: 98.2 F (36.8 C) 98.6 F (37 C) 97.8 F (36.6 C) 97.6 F (36.4 C)  TempSrc: Oral Oral Oral Oral  SpO2: 100% 98% 99% (!) 87%  Weight:   70.5 kg (155 lb 8 oz)   Height:       Physical Exam General: chronically ill appearing male in NAD Heart: S1,S2 2/6 systolic M Lungs: CTAB A/P Abdomen: +BS NTND, L groin incision, clean no drainage  Extremities: L pedal edema (improving per patient) Woody appearance BLE. Trace RLE edema with drsg R toes.  Dialysis Access: LFA AVF + bruit   Additional Objective Labs: Basic Metabolic Panel:  Recent Labs Lab 03/13/17 0840 03/14/17 0505 03/14/17 1832 03/15/17 0815  NA 132* 136 138 136  K 4.5 4.0 4.4 3.9  CL 94* 96* 98* 97*  CO2 22 28 25 25   GLUCOSE 112* 105* 110* 106*  BUN 49* 32* 36* 44*  CREATININE 7.37* 6.08* 6.39* 7.22*  CALCIUM 8.5* 8.7* 8.5* 8.4*  PHOS 5.0*  --  4.5 4.8*   Liver Function Tests:  Recent Labs Lab 03/13/17 0840 03/14/17 1832 03/15/17 0815  ALBUMIN 2.3* 2.4* 2.4*   No results for input(s): LIPASE, AMYLASE in the last 168 hours. CBC:  Recent Labs Lab 03/10/17 0727 03/13/17 0840 03/14/17 0505 03/14/17 1832 03/15/17 0815  WBC 11.4* 11.2* 12.1* 13.1* 12.7*  HGB 12.1* 12.5* 12.2* 11.6* 10.7*  HCT 38.3* 39.6 39.6 38.3* 34.4*  MCV 85.9 85.7 86.8 88.2 87.3  PLT 326 300 223 205 187   Blood Culture    Component Value Date/Time   SDES WOUND LEFT THIGH 03/14/2017 1132   SPECREQUEST VEIN HARVEST INCISION POF FORTAZ 03/14/2017 1132   CULT CULTURE REINCUBATED FOR BETTER GROWTH 03/14/2017 1132   REPTSTATUS PENDING 03/14/2017 1132    Cardiac Enzymes: No results for input(s): CKTOTAL, CKMB, CKMBINDEX,  TROPONINI in the last 168 hours. CBG: No results for input(s): GLUCAP in the last 168 hours. Iron Studies: No results for input(s): IRON, TIBC, TRANSFERRIN, FERRITIN in the last 72 hours. @lablastinr3 @ Studies/Results: No results found. Medications: . cefTAZidime (FORTAZ)  IV     . [START ON 03/17/2017] calcitRIOL  1.25 mcg Oral Q T,Th,Sat-1800  . calcium acetate  2,001 mg Oral TID WC  . [START ON 03/17/2017] cinacalcet  90 mg Oral Q T,Th,Sat-1800  . enoxaparin (LOVENOX) injection  30 mg Subcutaneous Q24H  . feeding supplement (PRO-STAT SUGAR FREE 64)  30 mL Oral BID  . gabapentin  100 mg Oral BID  . midodrine  10 mg Oral TID WC  . multivitamin  1 tablet Oral QHS  . senna  1 tablet Oral BID  . sodium chloride flush  3 mL Intravenous Q12H   Dialysis Orders:  TTS at [facility] 3:45hr, BFR 400, DFR 800, EDW 72kg, 2K/2Ca bath, AVF, Heparin 1400 bolus - Calcitriol 1.18mcg PO q HD - Venofer 100mg  x 10 ordered (4 given so far) for tsat 20% on 7/26. - Was getting sensipar 180mg  PO q HD (held on 7/28 d/t low Ca)  Assessment/Plan: 1. Sepsis 2/2 LLE and R foot wound infection/cellulitis: Now on IV Fortaz 10-14 days for  wound cult +  pseudomonas.  2. PAD/L foot gangrene: VVS following. Patient declining amputations. 3. Recurrent L groin lymphocele: S/p I&D thigh incision debridement of L groin 8/15 ; Repeat cult pending  2. ESRD -T,Th,S. Next HD tomorrow.  3. Anemia - HGB 10.7  Follow trend  4. Secondary hyperparathyroidism -  Cont binders/VDRA 5. HTN/volume - BP controlled, on soft side. Cont midodrine. Post HD wt 69.4kg. Now below EDW, lower at discharge  6. Nutrition - Renal diet/vitamins    Tomasa Blase PA-C [facility] Pager [phone] 03/16/2017,8:44 AM I have seen and examined this patient and agree with plan per Susann Givens.  Plan HD tomorrow.  He could be switched to PO cipro in anticipation of DC soon. Seerat Peaden T,MD 03/16/2017 9:15 AM

## 2017-03-16 NOTE — Progress Notes (Addendum)
Progress Note  SUBJECTIVE:    Left legs incisions hurt.   OBJECTIVE:   Vitals:   03/15/17 2207 03/16/17 0534  BP: 100/64 109/73  Pulse: 83 91  Resp: 18 18  Temp: 97.8 F (36.6 C) 97.6 F (36.4 C)  SpO2: 99% (!) 87%    Intake/Output Summary (Last 24 hours) at 03/16/17 0857 Last data filed at 03/16/17 0600  Gross per 24 hour  Intake              600 ml  Output             1000 ml  Net             -400 ml   Left groin without swelling. Nylon sutures intact. Minimal serous drainage from left groin. Left distal medial thigh incision clean and below knee incision clean with fibrinous exudate.  Superficial ulcer at left achilles area Palpable left leg graft pulse.  Dry gangrenous changes at left toes.   ASSESSMENT/PLAN:   66 y.o. male is s/p: Debridement of Incision and opening and debridement of proximal thigh incision and removal of staples and debridement of left groin incision 2 Days Post-Op   Most recent would cultures from 03/14/17 pending. Prior wound cultures on 03/07/17 showing pseudomonas. Currently on fortaz.   Continue wet to dry dressings to open incisions.  Stable dry gangrene to left toes.  Stable from vascular standpoint.   Willie Andrade 03/16/2017 8:57 AM -- LABS:   CBC    Component Value Date/Time   WBC 12.7 (H) 03/15/2017 0815   HGB 10.7 (L) 03/15/2017 0815   HCT 34.4 (L) 03/15/2017 0815   PLT 187 03/15/2017 0815    BMET    Component Value Date/Time   NA 136 03/15/2017 0815   K 3.9 03/15/2017 0815   CL 97 (L) 03/15/2017 0815   CO2 25 03/15/2017 0815   GLUCOSE 106 (H) 03/15/2017 0815   BUN 44 (H) 03/15/2017 0815   CREATININE 7.22 (H) 03/15/2017 0815   CALCIUM 8.4 (L) 03/15/2017 0815   GFRNONAA 7 (L) 03/15/2017 0815   GFRAA 8 (L) 03/15/2017 0815    COAG Lab Results  Component Value Date   INR 1.34 03/14/2017   INR 1.29 03/07/2017   INR 1.36 03/05/2017   No results found for: PTT  ANTIBIOTICS:   Anti-infectives    Start      Dose/Rate Route Frequency Ordered Stop   03/16/17 1800  cefTAZidime (FORTAZ) 1 g in dextrose 5 % 50 mL IVPB     1 g 100 mL/hr over 30 Minutes Intravenous Every 24 hours 03/16/17 0411     03/09/17 1830  cefTAZidime (FORTAZ) 1 g in dextrose 5 % 50 mL IVPB  Status:  Discontinued     1 g 100 mL/hr over 30 Minutes Intravenous Every 24 hours 03/09/17 1807 03/16/17 0334   03/07/17 0600  ceFAZolin (ANCEF) IVPB 1 g/50 mL premix  Status:  Discontinued    Comments:  Send with pt to OR   1 g 100 mL/hr over 30 Minutes Intravenous To Surgery 03/06/17 1021 03/07/17 1118   03/06/17 2018  Vancomycin (VANCOCIN) 750-5 MG/150ML-% IVPB    Comments:  Kiang, Angelito   : cabinet override      03/06/17 2018 03/06/17 2130   03/06/17 1200  vancomycin (VANCOCIN) IVPB 750 mg/150 ml premix  Status:  Discontinued     750 mg 150 mL/hr over 60 Minutes Intravenous Every T-Th-Sa (Hemodialysis) 03/06/17 0130 03/09/17 1755  03/05/17 2345  vancomycin (VANCOCIN) 1,500 mg in sodium chloride 0.9 % 500 mL IVPB     1,500 mg 250 mL/hr over 120 Minutes Intravenous  Once 03/05/17 2342 03/06/17 0229       Willie Berger, PA-C [facility] Office: [phone] Pager: [phone] 03/16/2017 8:57 AM  I have examined the patient, reviewed and agree with above.Patient has usual complaints of chronic pain. Easily palpable subcutaneous graft pulse. Dry gangrene tips of his toes of his left foot and the entire left fourth toe. Explained that this would take some time for demarcation and either autoamputation or formal lactation in the future.  Willie Began, MD 03/16/2017 10:30 AM

## 2017-03-17 LAB — CBC
HCT: 34.5 % — ABNORMAL LOW (ref 39.0–52.0)
Hemoglobin: 10.7 g/dL — ABNORMAL LOW (ref 13.0–17.0)
MCH: 27.5 pg (ref 26.0–34.0)
MCHC: 31 g/dL (ref 30.0–36.0)
MCV: 88.7 fL (ref 78.0–100.0)
PLATELETS: 187 10*3/uL (ref 150–400)
RBC: 3.89 MIL/uL — ABNORMAL LOW (ref 4.22–5.81)
RDW: 20.6 % — AB (ref 11.5–15.5)
WBC: 14 10*3/uL — AB (ref 4.0–10.5)

## 2017-03-17 LAB — RENAL FUNCTION PANEL
Albumin: 2.4 g/dL — ABNORMAL LOW (ref 3.5–5.0)
Anion gap: 16 — ABNORMAL HIGH (ref 5–15)
BUN: 39 mg/dL — ABNORMAL HIGH (ref 6–20)
CHLORIDE: 96 mmol/L — AB (ref 101–111)
CO2: 23 mmol/L (ref 22–32)
CREATININE: 6.8 mg/dL — AB (ref 0.61–1.24)
Calcium: 8.6 mg/dL — ABNORMAL LOW (ref 8.9–10.3)
GFR calc Af Amer: 9 mL/min — ABNORMAL LOW (ref >60)
GFR calc non Af Amer: 8 mL/min — ABNORMAL LOW (ref >60)
Glucose, Bld: 122 mg/dL — ABNORMAL HIGH (ref 65–99)
Phosphorus: 3.9 mg/dL (ref 2.5–4.6)
Potassium: 4.2 mmol/L (ref 3.5–5.1)
Sodium: 135 mmol/L (ref 135–145)

## 2017-03-17 LAB — AEROBIC CULTURE  (SUPERFICIAL SPECIMEN)

## 2017-03-17 LAB — AEROBIC CULTURE W GRAM STAIN (SUPERFICIAL SPECIMEN)

## 2017-03-17 MED ORDER — SODIUM CHLORIDE 0.9 % IV SOLN: 100.0000 mL | INTRAVENOUS | Status: DC | PRN

## 2017-03-17 MED ORDER — HEPARIN SODIUM (PORCINE) 1000 UNIT/ML DIALYSIS: 1000.0000 [IU] | INTRAMUSCULAR | Status: DC | PRN

## 2017-03-17 MED ORDER — LIDOCAINE-PRILOCAINE 2.5-2.5 % EX CREA: 1.0000 "application " | TOPICAL_CREAM | CUTANEOUS | Status: DC | PRN

## 2017-03-17 MED ORDER — HEPARIN SODIUM (PORCINE) 1000 UNIT/ML DIALYSIS: 20.0000 [IU]/kg | INTRAMUSCULAR | Status: DC | PRN

## 2017-03-17 MED ORDER — ACETAMINOPHEN 325 MG PO TABS
ORAL_TABLET | ORAL | Status: AC
  Administered 2017-03-17: 650 mg via ORAL
  Filled 2017-03-17: qty 2

## 2017-03-17 MED ORDER — MIDODRINE HCL 5 MG PO TABS
ORAL_TABLET | ORAL | Status: AC
  Administered 2017-03-17: 10 mg via ORAL
  Filled 2017-03-17: qty 2

## 2017-03-17 MED ORDER — PENTAFLUOROPROP-TETRAFLUOROETH EX AERO: 1.0000 "application " | INHALATION_SPRAY | CUTANEOUS | Status: DC | PRN

## 2017-03-17 MED ORDER — HYDROMORPHONE HCL 1 MG/ML IJ SOLN
INTRAMUSCULAR | Status: AC
  Administered 2017-03-17: 1 mg via INTRAVENOUS
  Filled 2017-03-17: qty 1

## 2017-03-17 MED ORDER — LIDOCAINE HCL (PF) 1 % IJ SOLN: 5.0000 mL | INTRAMUSCULAR | Status: DC | PRN

## 2017-03-17 MED ORDER — OXYCODONE-ACETAMINOPHEN 5-325 MG PO TABS
ORAL_TABLET | ORAL | Status: AC
  Administered 2017-03-17: 2 via ORAL
  Filled 2017-03-17: qty 2

## 2017-03-17 NOTE — Progress Notes (Signed)
Marland Kitchen           [facility]                                                                              Patient Demographics  Linsey Linsey, is a 66 y.o. male, DOB - 11/02/1950, ZDG:[id number]  Admit date - 03/05/2017   Admitting Physician Toy Baker, MD  Outpatient Primary MD for the patient is Lucianne Lei, MD  Outpatient specialists:   LOS - 11  days   Medical records reviewed and are as summarized below:    Chief Complaint  Patient presents with  . Abscess  . 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 No worsening drainage from his left toes. In the ED, WBC 24K, high lactate and pro-calcitonin, clinical picture consistent with cellulitis, no osteomyelitis. He was seen by home health RN who felt that his wounds were worse and was advised to come to the ED. Empirically started on IV vancomycin. Vascular surgery and nephrology were consulted.    Assessment & Plan     Recurrent left groin lymphocele - Status post excision of the lymphocele on 8/8 and drain in place which is still draining copious amounts - Vascular surgery following, operative cultures grew pseudomonas and patient placed on IV Fortaz, 10-14 days or per vascular surgery - cultures 8/8 showed Pseudomonas, recent cultures from 8/15 showing pseudomonas, Currently on IV Fortaz - Per vascular surgery, continue wet-to-dry dressings to open incisions - Continue pain control    PAD severe with left foot  gangrene/purulent drainage from the left leg saphenectomy site - Vascular surgery following closely, left leg bypass is patent - Status post debridement of incision and opening and debridement of proximal thigh incision and removal of staples and debridement of left groin incision  - Severe PAD with left foot gangrene, patient has been declining amputations, has chronic and severe pain    ESRD (end stage renal disease) ([place]) - Nephrology consulted - on hemodialysis TTS    Pacemaker, Chronic systolic heart failure ([place]), 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  - Blood cultures 2 negative. Vancomycin has been discontinued.  - Secondary to lower extremity wound infections, #1 and #2    Severe protein-calorie malnutrition ([place]) - Nutrition consulted  Code Status: Full code DVT Prophylaxis:  Lovenox  Family Communication: Discussed in detail with the patient, all imaging results, lab results explained to the patient   Disposition Plan: Discussed in detail with the patient regarding skilled nursing  facility. He states he will think about it this weekend.  Time Spent in minutes 15 minutes  Procedures:    Consultants:   Nephrology   vascular surgery  Antimicrobials:      Medications  Scheduled Meds: . calcitRIOL  1.25 mcg Oral Q T,Th,Sat-1800  . calcium acetate  2,001 mg Oral TID WC  . cinacalcet  90 mg Oral Q T,Th,Sat-1800  . enoxaparin (LOVENOX) injection  30 mg Subcutaneous Q24H  . feeding supplement (PRO-STAT SUGAR FREE 64)  30 mL Oral BID  . gabapentin  100 mg Oral BID  . midodrine  10 mg Oral TID WC  . multivitamin  1 tablet Oral QHS  . senna  1 tablet Oral BID  . sodium chloride flush  3 mL Intravenous Q12H   Continuous Infusions: .  cefTAZidime (FORTAZ)  IV Stopped (03/16/17 1757)   PRN Meds:.acetaminophen **OR** acetaminophen, HYDROmorphone (DILAUDID) injection, ondansetron **OR** ondansetron (ZOFRAN) IV, oxyCODONE-acetaminophen   Antibiotics   Anti-infectives    Start     Dose/Rate Route Frequency Ordered Stop   03/16/17 1800  cefTAZidime (FORTAZ) 1 g in dextrose 5 % 50 mL IVPB     1 g 100 mL/hr over 30 Minutes Intravenous Every 24 hours 03/16/17 0411     03/09/17 1830  cefTAZidime (FORTAZ) 1 g in dextrose 5 % 50 mL IVPB  Status:  Discontinued     1 g 100 mL/hr over 30 Minutes Intravenous Every 24 hours 03/09/17 1807 03/16/17 0334   03/07/17 0600  ceFAZolin (ANCEF) IVPB 1 g/50 mL premix  Status:  Discontinued    Comments:  Send with pt to OR   1 g 100 mL/hr over 30 Minutes Intravenous To Surgery 03/06/17 1021 03/07/17 1118   03/06/17 2018  Vancomycin (VANCOCIN) 750-5 MG/150ML-% IVPB    Comments:  Kiang, Angelito   : cabinet override      03/06/17 2018 03/06/17 2130   03/06/17 1200  vancomycin (VANCOCIN) IVPB 750 mg/150 ml premix  Status:  Discontinued     750 mg 150 mL/hr over 60 Minutes Intravenous Every T-Th-Sa (Hemodialysis) 03/06/17 0130 03/09/17 1755   03/05/17 2345  vancomycin (VANCOCIN) 1,500 mg in sodium chloride 0.9 % 500 mL IVPB     1,500 mg 250 mL/hr over 120 Minutes Intravenous  Once 03/05/17 2342 03/06/17 0229        Subjective:   Desmund Callicott was seen and examined today. Denies any complaints. States pain is not well controlled but was in the bed comfortably sleeping before my encounter. No fevers. Denies denies dizziness, chest pain, shortness of breath, abdominal pain, N/V/D/C.  Objective:   Vitals:   03/17/17 1230 03/17/17 1300 03/17/17 1307 03/17/17 1310  BP: (!) 105/57 (!) 99/46 109/68 114/64  Pulse: 87 79 94 85  Resp:   16   Temp:   98.2 F (36.8 C)   TempSrc:      SpO2:      Weight:   71.4 kg (157 lb 6.5 oz)   Height:        Intake/Output Summary (Last 24 hours) at  03/17/17 1444 Last data filed at 03/17/17 1310  Gross per 24 hour  Intake              290 ml  Output             1000 ml  Net             -710 ml     Wt Readings from Last  3 Encounters:  03/17/17 71.4 kg (157 lb 6.5 oz)  02/27/17 72.6 kg (160 lb)  02/13/17 74.4 kg (164 lb 0.4 oz)     Exam   General: Alert and oriented x 3, NAD, irritable  Eyes:   HEENT:    Cardiovascular: S1 S2 auscultated, no rubs, murmurs or gallops. Regular rate and rhythm. No pedal edema b/l  Respiratory: Clear to auscultation bilaterally, no wheezing, rales or rhonchi  Gastrointestinal: Soft, nontender, nondistended, + bowel sounds  Ext: no pedal edema bilaterally  Neuro: no new neuro deficits  Musculoskeletal: No digital cyanosis, clubbing  Skin: Chronic dry changes in the left toe, foot, and right toes  Psych: alert and oriented., Irritable   Data Reviewed:  I have personally reviewed following labs and imaging studies  Micro Results Recent Results (from the past 240 hour(s))  Anaerobic culture     Status: None (Preliminary result)   Collection Time: 03/14/17 11:32 AM  Result Value Ref Range Status   Specimen Description WOUND LEFT THIGH  Final   Special Requests VEIN HARVEST INCISION POF FORTAZ  Final   Culture   Final    NO ANAEROBES ISOLATED; CULTURE IN PROGRESS FOR 5 DAYS   Report Status PENDING  Incomplete  Aerobic Culture (superficial specimen)     Status: None   Collection Time: 03/14/17 11:32 AM  Result Value Ref Range Status   Specimen Description WOUND LEFT THIGH  Final   Special Requests VEIN HARVEST INCISION POF FORTAZ  Final   Gram Stain   Final    RARE WBC PRESENT,BOTH PMN AND MONONUCLEAR NO ORGANISMS SEEN    Culture RARE PSEUDOMONAS AERUGINOSA  Final   Report Status 03/17/2017 FINAL  Final   Organism ID, Bacteria PSEUDOMONAS AERUGINOSA  Final      Susceptibility   Pseudomonas aeruginosa - MIC*    CEFTAZIDIME 4 SENSITIVE Sensitive     CIPROFLOXACIN <=0.25  SENSITIVE Sensitive     GENTAMICIN 2 SENSITIVE Sensitive     IMIPENEM 2 SENSITIVE Sensitive     PIP/TAZO 8 SENSITIVE Sensitive     CEFEPIME 2 SENSITIVE Sensitive     * RARE PSEUDOMONAS AERUGINOSA    Radiology Reports Dg Chest 2 View  Result Date: 03/05/2017 CLINICAL DATA:  Bilateral foot wounds, hypotension and elevated lactic acid level. EXAM: CHEST  2 VIEW COMPARISON:  02/11/2017 FINDINGS: Stable moderate cardiac enlargement and radiographic appearance of a biventricular pacing/ICD device. There is no evidence of pulmonary edema, consolidation, pneumothorax, nodule or pleural fluid. The bony thorax is unremarkable. IMPRESSION: Stable cardiomegaly.  No acute findings in the chest. Electronically Signed   By: Aletta Edouard M.D.   On: 03/05/2017 18:20   Dg Ankle Complete Left  Result Date: 03/05/2017 CLINICAL DATA:  Possible infection.  Soft tissue swelling. EXAM: LEFT ANKLE COMPLETE - 3+ VIEW COMPARISON:  None. FINDINGS: The bones are demineralized. There is no acute fracture dislocations involving the left ankle. Dorsal calcaneal enthesophyte is noted. The ankle and subtalar joints are maintained. Vascular calcifications are noted crossing the ankle joint which can be seen with diabetes. Diffuse soft tissue swelling and thickening is noted which may reflect chronic venous insufficiency, third spacing of fluid or possibly stigmata of a cellulitis. No bone destruction is noted. IMPRESSION: 1. Nonspecific diffuse soft tissue swelling of the included left leg and ankle possibly representing chronic venous insufficiency, third spacing of fluid or possibly cellulitis. 2. No evidence of osteomyelitis. 3. Osteopenia. Electronically Signed   By: Meredith Leeds.D.  On: 03/05/2017 20:13   Dg Foot Complete Left  Result Date: 03/05/2017 CLINICAL DATA:  Possible infection EXAM: LEFT FOOT - COMPLETE 3+ VIEW COMPARISON:  None. FINDINGS: Diffuse soft tissue swelling is noted, nonspecific but possibly representing  stigmata of cellulitis, third spacing of fluid or possibly from chronic venous insufficiency. Bones are demineralized in appearance without frank bone destruction. Mild degenerative joint space narrowing is seen of the toes. No acute fracture joint dislocations. Dorsal calcaneal enthesophyte is seen. Ankle mortise and subtalar articulations are maintained as are the midfoot joints. IMPRESSION: 1. Osteopenic appearance of the left foot without fracture or frank bone destruction. 2. Soft tissue swelling of the included leg, ankle and foot. Electronically Signed   By: Ashley Royalty M.D.   On: 03/05/2017 20:16   Dg Foot Complete Right  Result Date: 03/05/2017 CLINICAL DATA:  Possible infection. EXAM: RIGHT FOOT COMPLETE - 3+ VIEW COMPARISON:  None. FINDINGS: Diffuse soft tissue swelling of the included leg, ankle and foot especially over the dorsum of the forefoot. No underlying bone destruction or fracture. No joint dislocations. Bones are demineralized in appearance. Dorsal calcaneal enthesophyte is seen. IMPRESSION: 1. Nonspecific soft tissue swelling of the included leg, ankle and foot. Findings could be due to third spacing of fluid, cellulitis or possibly chronic venous insufficiency. 2. No underlying fracture. 3. No evidence of osteomyelitis. Electronically Signed   By: Ashley Royalty M.D.   On: 03/05/2017 20:19    Lab Data:  CBC:  Recent Labs Lab 03/13/17 0840 03/14/17 0505 03/14/17 1832 03/15/17 0815 03/17/17 0943  WBC 11.2* 12.1* 13.1* 12.7* 14.0*  HGB 12.5* 12.2* 11.6* 10.7* 10.7*  HCT 39.6 39.6 38.3* 34.4* 34.5*  MCV 85.7 86.8 88.2 87.3 88.7  PLT 300 223 205 187 517   Basic Metabolic Panel:  Recent Labs Lab 03/13/17 0840 03/14/17 0505 03/14/17 1832 03/15/17 0815 03/17/17 0943  NA 132* 136 138 136 135  K 4.5 4.0 4.4 3.9 4.2  CL 94* 96* 98* 97* 96*  CO2 _0 GLUCOSE 112* 105* 110* 106* 122*  BUN 49* 32* 36* 44* 39*  CREATININE 7.37* 6.08* 6.39* 7.22* 6.80*  CALCIUM  8.5* 8.7* 8.5* 8.4* 8.6*  PHOS 5.0*  --  4.5 4.8* 3.9   GFR: Estimated Creatinine Clearance: 10.8 mL/min (A) (by C-G formula based on SCr of 6.8 mg/dL (H)). Liver Function Tests:  Recent Labs Lab 03/13/17 0840 03/14/17 1832 03/15/17 0815 03/17/17 0943  ALBUMIN 2.3* 2.4* 2.4* 2.4*   No results for input(s): LIPASE, AMYLASE in the last 168 hours. No results for input(s): AMMONIA in the last 168 hours. Coagulation Profile:  Recent Labs Lab 03/14/17 0505  INR 1.34   Cardiac Enzymes: No results for input(s): CKTOTAL, CKMB, CKMBINDEX, TROPONINI in the last 168 hours. BNP (last 3 results) No results for input(s): PROBNP in the last 8760 hours. HbA1C: No results for input(s): HGBA1C in the last 72 hours. CBG: No results for input(s): GLUCAP in the last 168 hours. Lipid Profile: No results for input(s): CHOL, HDL, LDLCALC, TRIG, CHOLHDL, LDLDIRECT in the last 72 hours. Thyroid Function Tests: No results for input(s): TSH, T4TOTAL, FREET4, T3FREE, THYROIDAB in the last 72 hours. Anemia Panel: No results for input(s): VITAMINB12, FOLATE, FERRITIN, TIBC, IRON, RETICCTPCT in the last 72 hours. Urine analysis: No results found for: COLORURINE, APPEARANCEUR, LABSPEC, PHURINE, GLUCOSEU, HGBUR, BILIRUBINUR, KETONESUR, PROTEINUR, UROBILINOGEN, NITRITE, LEUKOCYTESUR   Ripudeep Rai M.D. [facility] 03/17/2017, 2:44 PM  Pager: [phone] Between 7am to  7pm - call Pager - [phone]  After 7pm go to [email or web address] - password [email or web address]  Call night coverage person covering after 7pm  .           [facility]                                                                              Patient Demographics  Oather Martos, is a 66 y.o. male, DOB - October 28, 1950, HER:[id number]  Admit date - 03/05/2017   Admitting Physician Toy Baker, MD  Outpatient Primary MD for the patient is Lucianne Lei, MD  Outpatient specialists:   LOS - 11  days   Medical records reviewed  and are as summarized below:    Chief Complaint  Patient presents with  . Abscess  . 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 No worsening drainage from his left toes. In the ED, WBC 24K, high lactate and pro-calcitonin, clinical picture consistent with cellulitis, no osteomyelitis. He was seen by home health RN who felt that his wounds were worse and was advised to come to the ED. Empirically started on IV vancomycin. Vascular surgery and nephrology were consulted.    Assessment & Plan     Recurrent left groin lymphocele - Status post excision of the lymphocele on 8/8 and drain in place which is still draining copious amounts - Vascular surgery following, operative cultures grew pseudomonas and patient placed on IV Fortaz, 10-14 days or per vascular surgery    PAD severe with left foot gangrene/purulent drainage from the left leg saphenectomy site - Vascular surgery following closely, left leg bypass is patent - Status post debridement of incision and opening and debridement of proximal thigh incision and removal of staples and debridement of left groin incision today    ESRD (end stage renal disease) ([place]) - Nephrology consulted - on hemodialysis TTS    Pacemaker, Chronic systolic heart failure ([place]), 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 Blood cultures 2 negative. Vancomycin has been discontinued.  - Secondary to lower extremity wound infections, #1 and #2    Severe protein-calorie malnutrition ([place]) - Nutrition consulted  Code Status: Full code DVT Prophylaxis:  Lovenox  Family Communication: Discussed in detail with the patient, all imaging results, lab results explained to the patient   Disposition Plan:   Time Spent in minutes 25 minutes  Procedures:    Consultants:   Nephrology   vascular surgery  Antimicrobials:      Medications  Scheduled Meds: .  calcitRIOL  1.25 mcg Oral Q T,Th,Sat-1800  . calcium acetate  2,001 mg Oral TID WC  . cinacalcet  90 mg Oral Q T,Th,Sat-1800  . enoxaparin (LOVENOX) injection  30 mg Subcutaneous Q24H  . feeding supplement (PRO-STAT SUGAR FREE 64)  30 mL Oral BID  . gabapentin  100 mg Oral BID  . midodrine  10 mg Oral TID WC  . multivitamin  1 tablet Oral QHS  . senna  1 tablet Oral BID  . sodium chloride flush  3 mL Intravenous Q12H   Continuous Infusions: . cefTAZidime (FORTAZ)  IV Stopped (03/16/17 1757)   PRN Meds:.acetaminophen **OR** acetaminophen, HYDROmorphone (DILAUDID) injection, ondansetron **OR** ondansetron (ZOFRAN) IV, oxyCODONE-acetaminophen   Antibiotics   Anti-infectives    Start     Dose/Rate Route Frequency Ordered Stop   03/16/17 1800  cefTAZidime (FORTAZ) 1 g in dextrose 5 % 50 mL IVPB     1 g 100 mL/hr over 30 Minutes Intravenous Every 24 hours 03/16/17 0411     03/09/17 1830  cefTAZidime (FORTAZ) 1 g in dextrose 5 % 50 mL IVPB  Status:  Discontinued     1 g 100 mL/hr over 30 Minutes Intravenous Every 24 hours 03/09/17 1807 03/16/17 0334   03/07/17 0600  ceFAZolin (ANCEF) IVPB 1 g/50 mL premix  Status:  Discontinued    Comments:  Send with pt to OR   1 g 100 mL/hr over 30 Minutes Intravenous To Surgery 03/06/17 1021 03/07/17 1118   03/06/17 2018   Vancomycin (VANCOCIN) 750-5 MG/150ML-% IVPB    Comments:  Kiang, Angelito   : cabinet override      03/06/17 2018 03/06/17 2130   03/06/17 1200  vancomycin (VANCOCIN) IVPB 750 mg/150 ml premix  Status:  Discontinued     750 mg 150 mL/hr over 60 Minutes Intravenous Every T-Th-Sa (Hemodialysis) 03/06/17 0130 03/09/17 1755   03/05/17 2345  vancomycin (VANCOCIN) 1,500 mg in sodium chloride 0.9 % 500 mL IVPB     1,500 mg 250 mL/hr over 120 Minutes Intravenous  Once 03/05/17 2342 03/06/17 0229        Subjective:   Hipolito Coury was seen and examined today. Seen after the surgery. Patient denies dizziness, chest pain, shortness of breath, abdominal pain, N/V/D/C, new weakness, numbess, tingling. No acute events overnight.    Objective:   Vitals:   03/17/17 1230 03/17/17 1300 03/17/17 1307 03/17/17 1310  BP: (!) 105/57 (!) 99/46 109/68 114/64  Pulse: 87 79 94 85  Resp:   16   Temp:   98.2 F (36.8 C)   TempSrc:      SpO2:      Weight:   71.4 kg (157 lb 6.5 oz)   Height:        Intake/Output Summary (Last 24 hours) at 03/17/17 1444 Last data filed at 03/17/17 1310  Gross per 24 hour  Intake              290 ml  Output             1000 ml  Net             -710 ml     Wt Readings from Last 3 Encounters:  03/17/17 71.4 kg (157 lb 6.5 oz)  02/27/17 72.6 kg (160 lb)  02/13/17 74.4 kg (164 lb 0.4 oz)     Exam  General: Alert and oriented, NAD, appears comfortable   Eyes:   HEENT:    Cardiovascular: S1 S2 auscultated,  no rubs, murmurs or gallops. Regular rate and rhythm.  Respiratory: Clear to auscultation bilaterally, no wheezing, rales or rhonchi  Gastrointestinal: Soft, nontender, nondistended, + bowel sounds  Ext: chronic dry changes in left toe and foot  Neuro:  No FND  Musculoskeletal: No digital cyanosis, clubbing  Skin: No rashes  Psych: alert and comfortable    Data Reviewed:  I have personally reviewed following labs and imaging studies  Micro  Results Recent Results (from the past 240 hour(s))  Anaerobic culture     Status: None (Preliminary result)   Collection Time: 03/14/17 11:32 AM  Result Value Ref Range Status   Specimen Description WOUND LEFT THIGH  Final   Special Requests VEIN HARVEST INCISION POF FORTAZ  Final   Culture   Final    NO ANAEROBES ISOLATED; CULTURE IN PROGRESS FOR 5 DAYS   Report Status PENDING  Incomplete  Aerobic Culture (superficial specimen)     Status: None   Collection Time: 03/14/17 11:32 AM  Result Value Ref Range Status   Specimen Description WOUND LEFT THIGH  Final   Special Requests VEIN HARVEST INCISION POF FORTAZ  Final   Gram Stain   Final    RARE WBC PRESENT,BOTH PMN AND MONONUCLEAR NO ORGANISMS SEEN    Culture RARE PSEUDOMONAS AERUGINOSA  Final   Report Status 03/17/2017 FINAL  Final   Organism ID, Bacteria PSEUDOMONAS AERUGINOSA  Final      Susceptibility   Pseudomonas aeruginosa - MIC*    CEFTAZIDIME 4 SENSITIVE Sensitive     CIPROFLOXACIN <=0.25 SENSITIVE Sensitive     GENTAMICIN 2 SENSITIVE Sensitive     IMIPENEM 2 SENSITIVE Sensitive     PIP/TAZO 8 SENSITIVE Sensitive     CEFEPIME 2 SENSITIVE Sensitive     * RARE PSEUDOMONAS AERUGINOSA    Radiology Reports Dg Chest 2 View  Result Date: 03/05/2017 CLINICAL DATA:  Bilateral foot wounds, hypotension and elevated lactic acid level. EXAM: CHEST  2 VIEW COMPARISON:  02/11/2017 FINDINGS: Stable moderate cardiac enlargement and radiographic appearance of a biventricular pacing/ICD device. There is no evidence of pulmonary edema, consolidation, pneumothorax, nodule or pleural fluid. The bony thorax is unremarkable. IMPRESSION: Stable cardiomegaly.  No acute findings in the chest. Electronically Signed   By: Aletta Edouard M.D.   On: 03/05/2017 18:20   Dg Ankle Complete Left  Result Date: 03/05/2017 CLINICAL DATA:  Possible infection.  Soft tissue swelling. EXAM: LEFT ANKLE COMPLETE - 3+ VIEW COMPARISON:  None. FINDINGS: The bones  are demineralized. There is no acute fracture dislocations involving the left ankle. Dorsal calcaneal enthesophyte is noted. The ankle and subtalar joints are maintained. Vascular calcifications are noted crossing the ankle joint which can be seen with diabetes. Diffuse soft tissue swelling and thickening is noted which may reflect chronic venous insufficiency, third spacing of fluid or possibly stigmata of a cellulitis. No bone destruction is noted. IMPRESSION: 1. Nonspecific diffuse soft tissue swelling of the included left leg and ankle possibly representing chronic venous insufficiency, third spacing of fluid or possibly cellulitis. 2. No evidence of osteomyelitis. 3. Osteopenia. Electronically Signed   By: Ashley Royalty M.D.   On: 03/05/2017 20:13   Dg Foot Complete Left  Result Date: 03/05/2017 CLINICAL DATA:  Possible infection EXAM: LEFT FOOT - COMPLETE 3+ VIEW COMPARISON:  None. FINDINGS: Diffuse soft tissue swelling is noted, nonspecific but possibly representing stigmata of cellulitis, third spacing of fluid or possibly from chronic venous insufficiency. Bones are demineralized in appearance without frank  bone destruction. Mild degenerative joint space narrowing is seen of the toes. No acute fracture joint dislocations. Dorsal calcaneal enthesophyte is seen. Ankle mortise and subtalar articulations are maintained as are the midfoot joints. IMPRESSION: 1. Osteopenic appearance of the left foot without fracture or frank bone destruction. 2. Soft tissue swelling of the included leg, ankle and foot. Electronically Signed   By: Ashley Royalty M.D.   On: 03/05/2017 20:16   Dg Foot Complete Right  Result Date: 03/05/2017 CLINICAL DATA:  Possible infection. EXAM: RIGHT FOOT COMPLETE - 3+ VIEW COMPARISON:  None. FINDINGS: Diffuse soft tissue swelling of the included leg, ankle and foot especially over the dorsum of the forefoot. No underlying bone destruction or fracture. No joint dislocations. Bones are  demineralized in appearance. Dorsal calcaneal enthesophyte is seen. IMPRESSION: 1. Nonspecific soft tissue swelling of the included leg, ankle and foot. Findings could be due to third spacing of fluid, cellulitis or possibly chronic venous insufficiency. 2. No underlying fracture. 3. No evidence of osteomyelitis. Electronically Signed   By: Ashley Royalty M.D.   On: 03/05/2017 20:19    Lab Data:  CBC:  Recent Labs Lab 03/13/17 0840 03/14/17 0505 03/14/17 1832 03/15/17 0815 03/17/17 0943  WBC 11.2* 12.1* 13.1* 12.7* 14.0*  HGB 12.5* 12.2* 11.6* 10.7* 10.7*  HCT 39.6 39.6 38.3* 34.4* 34.5*  MCV 85.7 86.8 88.2 87.3 88.7  PLT 300 223 205 187 382   Basic Metabolic Panel:  Recent Labs Lab 03/13/17 0840 03/14/17 0505 03/14/17 1832 03/15/17 0815 03/17/17 0943  NA 132* 136 138 136 135  K 4.5 4.0 4.4 3.9 4.2  CL 94* 96* 98* 97* 96*  CO2 _0 GLUCOSE 112* 105* 110* 106* 122*  BUN 49* 32* 36* 44* 39*  CREATININE 7.37* 6.08* 6.39* 7.22* 6.80*  CALCIUM 8.5* 8.7* 8.5* 8.4* 8.6*  PHOS 5.0*  --  4.5 4.8* 3.9   GFR: Estimated Creatinine Clearance: 10.8 mL/min (A) (by C-G formula based on SCr of 6.8 mg/dL (H)). Liver Function Tests:  Recent Labs Lab 03/13/17 0840 03/14/17 1832 03/15/17 0815 03/17/17 0943  ALBUMIN 2.3* 2.4* 2.4* 2.4*   No results for input(s): LIPASE, AMYLASE in the last 168 hours. No results for input(s): AMMONIA in the last 168 hours. Coagulation Profile:  Recent Labs Lab 03/14/17 0505  INR 1.34   Cardiac Enzymes: No results for input(s): CKTOTAL, CKMB, CKMBINDEX, TROPONINI in the last 168 hours. BNP (last 3 results) No results for input(s): PROBNP in the last 8760 hours. HbA1C: No results for input(s): HGBA1C in the last 72 hours. CBG: No results for input(s): GLUCAP in the last 168 hours. Lipid Profile: No results for input(s): CHOL, HDL, LDLCALC, TRIG, CHOLHDL, LDLDIRECT in the last 72 hours. Thyroid Function Tests: No results for  input(s): TSH, T4TOTAL, FREET4, T3FREE, THYROIDAB in the last 72 hours. Anemia Panel: No results for input(s): VITAMINB12, FOLATE, FERRITIN, TIBC, IRON, RETICCTPCT in the last 72 hours. Urine analysis: No results found for: COLORURINE, APPEARANCEUR, LABSPEC, PHURINE, GLUCOSEU, HGBUR, BILIRUBINUR, KETONESUR, PROTEINUR, UROBILINOGEN, NITRITE, LEUKOCYTESUR   Ripudeep Rai M.D. [facility] 03/17/2017, 2:44 PM  Pager: [phone] Between 7am to 7pm - call Pager - [phone]  After 7pm go to [email or web address] - password [email or web address]  Call night coverage person covering after 7pm

## 2017-03-17 NOTE — Progress Notes (Signed)
Dialysis treatment completed.  1500 mL ultrafiltrated and net fluid removal 1000 mL.    Patient status unchanged. Lung sounds diminished to ausculation in all fields. BLE non pitting edema. Cardiac: NSr.  Disconnected lines and removed needles.  Pressure held for 10 minutes and band aid/gauze dressing applied.  Report given to bedside RN, Albin Felling.

## 2017-03-17 NOTE — Progress Notes (Addendum)
Progress Note  SUBJECTIVE:    Patient seen in HD  OBJECTIVE:   Vitals:   03/16/17 2042 03/17/17 0510  BP: 120/76 113/73  Pulse: 88 87  Resp: 18 18  Temp: 98 F (36.7 C) 98.6 F (37 C)  SpO2: 100% 100%    Intake/Output Summary (Last 24 hours) at 03/17/17 0910 Last data filed at 03/17/17 0510  Gross per 24 hour  Intake              290 ml  Output                0 ml  Net              290 ml   Left groin with mild serous drainage.  Left thigh and distal leg open saphenectomy sites clean. Palpable left subcutaneous graft pulse.  Dry gangrene left toes  ASSESSMENT/PLAN:   66 y.o. male is s/p: Debridement of Incision and opening and debridement of proximal thigh incision and removal of staples and debridement of left groin incision 3 Days Post-Op   Would cultures form 8/15 showing pseudomonas, same as prior culture on 8/8. Currently on fortaz. Could switch to oral cipro at d/c.   Wounds are clean. Continue dry gauze dressing to left groin. Serous drainage is expected. Wet to dry dressings to open incisions left leg.  Bypass is patent.    Will follow up wounds as outpatient. Dry gangrene right foot is stable. Patient has repeatedly refused amputation. Dr. Arbie Cookey discussed that his toes will likely auto amputate.   Ok to d/c from vascular standpoint.   Raymond Gurney 03/17/2017 9:10 AM -- LABS:   CBC    Component Value Date/Time   WBC 12.7 (H) 03/15/2017 0815   HGB 10.7 (L) 03/15/2017 0815   HCT 34.4 (L) 03/15/2017 0815   PLT 187 03/15/2017 0815    BMET    Component Value Date/Time   NA 136 03/15/2017 0815   K 3.9 03/15/2017 0815   CL 97 (L) 03/15/2017 0815   CO2 25 03/15/2017 0815   GLUCOSE 106 (H) 03/15/2017 0815   BUN 44 (H) 03/15/2017 0815   CREATININE 7.22 (H) 03/15/2017 0815   CALCIUM 8.4 (L) 03/15/2017 0815   GFRNONAA 7 (L) 03/15/2017 0815   GFRAA 8 (L) 03/15/2017 0815    COAG Lab Results  Component Value Date   INR 1.34 03/14/2017   INR  1.29 03/07/2017   INR 1.36 03/05/2017   No results found for: PTT  ANTIBIOTICS:   Anti-infectives    Start     Dose/Rate Route Frequency Ordered Stop   03/16/17 1800  cefTAZidime (FORTAZ) 1 g in dextrose 5 % 50 mL IVPB     1 g 100 mL/hr over 30 Minutes Intravenous Every 24 hours 03/16/17 0411     03/09/17 1830  cefTAZidime (FORTAZ) 1 g in dextrose 5 % 50 mL IVPB  Status:  Discontinued     1 g 100 mL/hr over 30 Minutes Intravenous Every 24 hours 03/09/17 1807 03/16/17 0334   03/07/17 0600  ceFAZolin (ANCEF) IVPB 1 g/50 mL premix  Status:  Discontinued    Comments:  Send with pt to OR   1 g 100 mL/hr over 30 Minutes Intravenous To Surgery 03/06/17 1021 03/07/17 1118   03/06/17 2018  Vancomycin (VANCOCIN) 750-5 MG/150ML-% IVPB    Comments:  Kiang, Angelito   : cabinet override      03/06/17 2018 03/06/17 2130   03/06/17 1200  vancomycin (VANCOCIN) IVPB 750 mg/150 ml premix  Status:  Discontinued     750 mg 150 mL/hr over 60 Minutes Intravenous Every T-Th-Sa (Hemodialysis) 03/06/17 0130 03/09/17 1755   03/05/17 2345  vancomycin (VANCOCIN) 1,500 mg in sodium chloride 0.9 % 500 mL IVPB     1,500 mg 250 mL/hr over 120 Minutes Intravenous  Once 03/05/17 2342 03/06/17 0229       Maris Berger, PA-C [facility] Office: [phone] Pager: [phone] 03/17/2017 9:10 AM  Agree with the above.  Stable from vascular perspective.  OK to discharge from vascular perspective with ABX at HD  [facility]

## 2017-03-17 NOTE — Progress Notes (Signed)
Patient arrived to unit per bed.  Reviewed treatment plan and this RN agrees.  Report received from bedside RN, Albin Felling.  Consent verified.  Patient A & O X 4. Lung sounds diminished to ausculation in all fields. BLE non pitting edema. Cardiac: NSR.  Prepped LUAVF with alcohol and cannulated with two 15 gauge needles.  Pulsation of blood noted.  Flushed access well with saline per protocol.  Connected and secured lines and initiated tx at 0922.  UF goal of 1500 mL and net fluid removal of 1000 mL.  Will continue to monitor.

## 2017-03-17 NOTE — Procedures (Signed)
Pt seen on HD.  Wound culture growing pseudomonas as well and remains on ceftazidime.  Ap 170 Vp 210 BFR 400.  Not sure what his DC disposition is going to be.  He wants to see if he can ambulate after HD today and if can't then seems agreeable to SNF.  If goes to SNF then switching to po cipro would make sense.

## 2017-03-18 DIAGNOSIS — Z95810 Presence of automatic (implantable) cardiac defibrillator: Secondary | ICD-10-CM

## 2017-03-18 DIAGNOSIS — I70263 Atherosclerosis of native arteries of extremities with gangrene, bilateral legs: Secondary | ICD-10-CM

## 2017-03-18 DIAGNOSIS — E785 Hyperlipidemia, unspecified: Secondary | ICD-10-CM

## 2017-03-18 DIAGNOSIS — Y832 Surgical operation with anastomosis, bypass or graft as the cause of abnormal reaction of the patient, or of later complication, without mention of misadventure at the time of the procedure: Secondary | ICD-10-CM

## 2017-03-18 DIAGNOSIS — T827XXA Infection and inflammatory reaction due to other cardiac and vascular devices, implants and grafts, initial encounter: Secondary | ICD-10-CM

## 2017-03-18 NOTE — Progress Notes (Signed)
[facility] Progress Note   Subjective:  Still with LE pain He's not sure about SNF "I want to see it first"   Objective Vitals:   03/17/17 1310 03/17/17 1844 03/17/17 2125 03/18/17 0538  BP: 114/64 (!) 106/54 (!) 108/52 116/73  Pulse: 85 79 99 90  Resp:  16 19 20   Temp:  98.3 F (36.8 C) 98.7 F (37.1 C) 98.6 F (37 C)  TempSrc:  Oral Oral Oral  SpO2:  98% 90% 90%  Weight:   71.2 kg (156 lb 15.5 oz)   Height:       Physical Exam General: chronically ill appearing male in NAD Heart: S1,S2 2/6 systolic M Lungs: CTAB A/P Abdomen: +BS NTND, L groin incision with dsg   Extremities: Trace LE edema  Woody appearance BLE. Trace RLE edema with drsg R toes.  Dialysis Access: LFA AVF + bruit   Additional Objective Labs: Basic Metabolic Panel:  Recent Labs Lab 03/14/17 1832 03/15/17 0815 03/17/17 0943  NA 138 136 135  K 4.4 3.9 4.2  CL 98* 97* 96*  CO2 25 25 23   GLUCOSE 110* 106* 122*  BUN 36* 44* 39*  CREATININE 6.39* 7.22* 6.80*  CALCIUM 8.5* 8.4* 8.6*  PHOS 4.5 4.8* 3.9   Liver Function Tests:  Recent Labs Lab 03/14/17 1832 03/15/17 0815 03/17/17 0943  ALBUMIN 2.4* 2.4* 2.4*   No results for input(s): LIPASE, AMYLASE in the last 168 hours. CBC:  Recent Labs Lab 03/13/17 0840 03/14/17 0505 03/14/17 1832 03/15/17 0815 03/17/17 0943  WBC 11.2* 12.1* 13.1* 12.7* 14.0*  HGB 12.5* 12.2* 11.6* 10.7* 10.7*  HCT 39.6 39.6 38.3* 34.4* 34.5*  MCV 85.7 86.8 88.2 87.3 88.7  PLT 300 223 205 187 187   Blood Culture    Component Value Date/Time   SDES WOUND LEFT THIGH 03/14/2017 1132   SDES WOUND LEFT THIGH 03/14/2017 1132   SPECREQUEST VEIN HARVEST INCISION POF FORTAZ 03/14/2017 1132   SPECREQUEST VEIN HARVEST INCISION POF FORTAZ 03/14/2017 1132   CULT  03/14/2017 1132    NO ANAEROBES ISOLATED; CULTURE IN PROGRESS FOR 5 DAYS   CULT RARE PSEUDOMONAS AERUGINOSA 03/14/2017 1132   REPTSTATUS PENDING 03/14/2017 1132   REPTSTATUS 03/17/2017  FINAL 03/14/2017 1132    Cardiac Enzymes: No results for input(s): CKTOTAL, CKMB, CKMBINDEX, TROPONINI in the last 168 hours. CBG: No results for input(s): GLUCAP in the last 168 hours. Iron Studies: No results for input(s): IRON, TIBC, TRANSFERRIN, FERRITIN in the last 72 hours. @lablastinr3 @ Studies/Results: No results found. Medications: . cefTAZidime (FORTAZ)  IV Stopped (03/17/17 1748)   . calcitRIOL  1.25 mcg Oral Q T,Th,Sat-1800  . calcium acetate  2,001 mg Oral TID WC  . cinacalcet  90 mg Oral Q T,Th,Sat-1800  . enoxaparin (LOVENOX) injection  30 mg Subcutaneous Q24H  . feeding supplement (PRO-STAT SUGAR FREE 64)  30 mL Oral BID  . gabapentin  100 mg Oral BID  . midodrine  10 mg Oral TID WC  . multivitamin  1 tablet Oral QHS  . senna  1 tablet Oral BID  . sodium chloride flush  3 mL Intravenous Q12H   Dialysis Orders:  TTS at [facility] 3:45hr, BFR 400, DFR 800, EDW 72kg, 2K/2Ca bath, AVF, Heparin 1400 bolus - Calcitriol 1.51mcg PO q HD - Venofer 100mg  x 10 ordered (4 given so far) for tsat 20% on 7/26. - Was getting sensipar 180mg  PO q HD (held on 7/28 d/t low Ca)  Assessment/Plan: 1.  Recurrent L groin lymphocele: S/p I&D thigh incision debridement of L groin 8/15 ; Repeat cult + pseudomonas. On IV Fortaz 10-14 days  per primary 2. PAD/L foot gangrene: VVS following. Patient declining amputations.  2. ESRD -TTS Next HD Tuesday  3. Anemia - HGB 10.7  Follow trend  4. Secondary hyperparathyroidism -  Cont binders/VDRA 5. HTN/volume - BP controlled, on soft side. Cont midodrine. Post HD wt 71.4g . Now below EDW, lower at discharge  6. Nutrition - Renal diet/vitamins    Tomasa Blase PA-C [facility] Pager [phone] 03/18/2017,9:16 AM I have seen and examined this patient and agree with plan per Susann Givens.  Plan next HD tues if still here.Marland Kitchen Nabil Bubolz T,MD 03/18/2017 9:33 AM

## 2017-03-18 NOTE — Progress Notes (Signed)
Willie Andrade Kitchen           [facility]                                                                              Patient Demographics  Willie Andrade, is a 66 y.o. male, DOB - 09/22/1950, [id number]:[id number]  Admit date - 03/05/2017   Admitting Physician Willie Baker, MD  Outpatient Primary MD for the patient is Willie Lei, MD  Outpatient specialists:   LOS - 12  days   Medical records reviewed and are as summarized below:    Chief Complaint  Patient presents with  . Abscess  . 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 No worsening drainage from his left toes. In the ED, WBC 24K, high lactate and pro-calcitonin, clinical picture consistent with cellulitis, no osteomyelitis. He was seen by home health RN who felt that his wounds were worse and was advised to come to the ED. Empirically started on IV vancomycin. Vascular surgery and nephrology were consulted.    Assessment & Plan     Recurrent left groin/thigh lymphocele - Status post excision of the lymphocele on 8/8 - Vascular surgery following, operative cultures grew pseudomonas and patient was placed on IV Fortaz - Vascular surgery following, patient underwent debridement of the incision, opening and debridement of proximal thigh incision and removal of staples, debridement of left groin incision again on 8/15 - cultures 8/8 showed Pseudomonas, recent cultures from 8/15 showing pseudomonas again, Currently on IV Fortaz -  Per vascular surgery, continue wet-to-dry dressings to open incisions - Continue pain control - Regarding antibiotics, requested ID consult (d/w Willie Andrade): PO vs IV abx duration, will also help with determination of disposition    PAD severe with left foot gangrene/purulent drainage from the left leg saphenectomy site - Vascular surgery following closely, left leg bypass is patent - Status post debridement of incision and opening and debridement of proximal thigh incision and removal of staples and debridement of left groin incision  - Severe PAD with left foot gangrene, patient has been declining amputations, has chronic and severe pain    ESRD (end stage renal disease) ([place]) - Nephrology consulted - on hemodialysis TTS    Pacemaker, Chronic systolic heart failure ([place]), 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  - Blood cultures 2 negative. Vancomycin has been discontinued.  - Secondary to lower extremity wound infections, #1 and #2    Severe protein-calorie malnutrition ([place]) - Nutrition consulted  Code Status: Full  code DVT Prophylaxis:  Lovenox  Family Communication: Discussed in detail with the patient, all imaging results, lab results explained to the patient   Disposition Plan: Patient wants private room in the skilled nursing facility or he will go home to his sister after his sister returns on Wednesday. She is out of town per patient.    Time Spent in minutes 15 minutes  Procedures:    Consultants:   Nephrology   vascular surgery  Antimicrobials:      Medications  Scheduled Meds: . calcitRIOL  1.25 mcg Oral Q T,Th,Sat-1800  . calcium acetate  2,001 mg Oral TID WC  . cinacalcet  90 mg Oral Q T,Th,Sat-1800  . enoxaparin (LOVENOX)  injection  30 mg Subcutaneous Q24H  . feeding supplement (PRO-STAT SUGAR FREE 64)  30 mL Oral BID  . gabapentin  100 mg Oral BID  . midodrine  10 mg Oral TID WC  . multivitamin  1 tablet Oral QHS  . senna  1 tablet Oral BID  . sodium chloride flush  3 mL Intravenous Q12H   Continuous Infusions: . cefTAZidime (FORTAZ)  IV Stopped (03/17/17 1748)   PRN Meds:.acetaminophen **OR** acetaminophen, HYDROmorphone (DILAUDID) injection, ondansetron **OR** ondansetron (ZOFRAN) IV, oxyCODONE-acetaminophen   Antibiotics   Anti-infectives    Start     Dose/Rate Route Frequency Ordered Stop   03/16/17 1800  cefTAZidime (FORTAZ) 1 g in dextrose 5 % 50 mL IVPB     1 g 100 mL/hr over 30 Minutes Intravenous Every 24 hours 03/16/17 0411     03/09/17 1830  cefTAZidime (FORTAZ) 1 g in dextrose 5 % 50 mL IVPB  Status:  Discontinued     1 g 100 mL/hr over 30 Minutes Intravenous Every 24 hours 03/09/17 1807 03/16/17 0334   03/07/17 0600  ceFAZolin (ANCEF) IVPB 1 g/50 mL premix  Status:  Discontinued    Comments:  Send with pt to OR   1 g 100 mL/hr over 30 Minutes Intravenous To Surgery 03/06/17 1021 03/07/17 1118   03/06/17 2018  Vancomycin (VANCOCIN) 750-5 MG/150ML-% IVPB    Comments:  Willie Andrade   : cabinet override      03/06/17 2018 03/06/17 2130   03/06/17 1200  vancomycin (VANCOCIN) IVPB 750 mg/150 ml premix  Status:  Discontinued     750 mg 150 mL/hr over 60 Minutes Intravenous Every T-Th-Sa (Hemodialysis) 03/06/17 0130 03/09/17 1755   03/05/17 2345  vancomycin (VANCOCIN) 1,500 mg in sodium chloride 0.9 % 500 mL IVPB     1,500 mg 250 mL/hr over 120 Minutes Intravenous  Once 03/05/17 2342 03/06/17 0229        Subjective:   Willie Andrade was seen and examined today. No complaints except wants private room in the skilled nursing facility or he will go home to his sister. No fevers or chills. States he still feels pain in his toes. Denies denies dizziness, chest pain, shortness of  breath, abdominal pain, N/V/D/C.  Objective:   Vitals:   03/17/17 1844 03/17/17 2125 03/18/17 0538 03/18/17 1000  BP: (!) 106/54 (!) 108/52 116/73 (!) 92/51  Pulse: 79 99 90 86  Resp: '16 19 20 18  '$ Temp: 98.3 F (36.8 C) 98.7 F (37.1 C) 98.6 F (37 C) 98.9 F (37.2 C)  TempSrc: Oral Oral Oral Oral  SpO2: 98% 90% 90% 98%  Weight:  71.2 kg (156 lb 15.5 oz)    Height:        Intake/Output Summary (Last 24 hours) at 03/18/17  [place] filed at 03/18/17 6811  Gross per 24 hour  Intake              240 ml  Output             1000 ml  Net             -760 ml     Wt Readings from Last 3 Encounters:  03/17/17 71.2 kg (156 lb 15.5 oz)  02/27/17 72.6 kg (160 lb)  02/13/17 74.4 kg (164 lb 0.4 oz)     Exam  Physical Exam  General: Alert and oriented x 3  Eyes:   HEENT:   Cardiovascular: S1 S2 auscultated, no rubs, murmurs or gallops. Regular rate and rhythm. No pedal edema b/l  Respiratory: Clear to auscultation bilaterally, no wheezing, rales or rhonchi  Gastrointestinal: Soft, nontender, nondistended, + bowel sounds  Ext: dry gangrenous changes of the left shows, left thigh dressing intact  Neuro: no new FND  Musculoskeletal: No digital cyanosis, clubbing  Skin: Dry gangrenous changes of the left toes  Psych: Normal affect and demeanor, alert and oriented x3    Data Reviewed:  I have personally reviewed following labs and imaging studies  Micro Results Recent Results (from the past 240 hour(s))  Anaerobic culture     Status: None (Preliminary result)   Collection Time: 03/14/17 11:32 AM  Result Value Ref Range Status   Specimen Description WOUND LEFT THIGH  Final   Special Requests VEIN HARVEST INCISION POF FORTAZ  Final   Culture   Final    NO ANAEROBES ISOLATED; CULTURE IN PROGRESS FOR 5 DAYS   Report Status PENDING  Incomplete  Aerobic Culture (superficial specimen)     Status: None   Collection Time: 03/14/17 11:32 AM  Result Value Ref Range  Status   Specimen Description WOUND LEFT THIGH  Final   Special Requests VEIN HARVEST INCISION POF FORTAZ  Final   Gram Stain   Final    RARE WBC PRESENT,BOTH PMN AND MONONUCLEAR NO ORGANISMS SEEN    Culture RARE PSEUDOMONAS AERUGINOSA  Final   Report Status 03/17/2017 FINAL  Final   Organism ID, Bacteria PSEUDOMONAS AERUGINOSA  Final      Susceptibility   Pseudomonas aeruginosa - MIC*    CEFTAZIDIME 4 SENSITIVE Sensitive     CIPROFLOXACIN <=0.25 SENSITIVE Sensitive     GENTAMICIN 2 SENSITIVE Sensitive     IMIPENEM 2 SENSITIVE Sensitive     PIP/TAZO 8 SENSITIVE Sensitive     CEFEPIME 2 SENSITIVE Sensitive     * RARE PSEUDOMONAS AERUGINOSA    Radiology Reports Dg Chest 2 View  Result Date: 03/05/2017 CLINICAL DATA:  Bilateral foot wounds, hypotension and elevated lactic acid level. EXAM: CHEST  2 VIEW COMPARISON:  02/11/2017 FINDINGS: Stable moderate cardiac enlargement and radiographic appearance of a biventricular pacing/ICD device. There is no evidence of pulmonary edema, consolidation, pneumothorax, nodule or pleural fluid. The bony thorax is unremarkable. IMPRESSION: Stable cardiomegaly.  No acute findings in the chest. Electronically Signed   By: Aletta Edouard M.D.   On: 03/05/2017 18:20   Dg Ankle Complete Left  Result Date: 03/05/2017 CLINICAL DATA:  Possible infection.  Soft tissue swelling. EXAM: LEFT ANKLE COMPLETE - 3+ VIEW COMPARISON:  None. FINDINGS: The bones are demineralized. There is no acute fracture dislocations involving the left ankle. Dorsal calcaneal enthesophyte is noted. The ankle and subtalar joints are maintained. Vascular calcifications are noted crossing the ankle joint which can be  seen with diabetes. Diffuse soft tissue swelling and thickening is noted which may reflect chronic venous insufficiency, third spacing of fluid or possibly stigmata of a cellulitis. No bone destruction is noted. IMPRESSION: 1. Nonspecific diffuse soft tissue swelling of the  included left leg and ankle possibly representing chronic venous insufficiency, third spacing of fluid or possibly cellulitis. 2. No evidence of osteomyelitis. 3. Osteopenia. Electronically Signed   By: Ashley Royalty M.D.   On: 03/05/2017 20:13   Dg Foot Complete Left  Result Date: 03/05/2017 CLINICAL DATA:  Possible infection EXAM: LEFT FOOT - COMPLETE 3+ VIEW COMPARISON:  None. FINDINGS: Diffuse soft tissue swelling is noted, nonspecific but possibly representing stigmata of cellulitis, third spacing of fluid or possibly from chronic venous insufficiency. Bones are demineralized in appearance without frank bone destruction. Mild degenerative joint space narrowing is seen of the toes. No acute fracture joint dislocations. Dorsal calcaneal enthesophyte is seen. Ankle mortise and subtalar articulations are maintained as are the midfoot joints. IMPRESSION: 1. Osteopenic appearance of the left foot without fracture or frank bone destruction. 2. Soft tissue swelling of the included leg, ankle and foot. Electronically Signed   By: Ashley Royalty M.D.   On: 03/05/2017 20:16   Dg Foot Complete Right  Result Date: 03/05/2017 CLINICAL DATA:  Possible infection. EXAM: RIGHT FOOT COMPLETE - 3+ VIEW COMPARISON:  None. FINDINGS: Diffuse soft tissue swelling of the included leg, ankle and foot especially over the dorsum of the forefoot. No underlying bone destruction or fracture. No joint dislocations. Bones are demineralized in appearance. Dorsal calcaneal enthesophyte is seen. IMPRESSION: 1. Nonspecific soft tissue swelling of the included leg, ankle and foot. Findings could be due to third spacing of fluid, cellulitis or possibly chronic venous insufficiency. 2. No underlying fracture. 3. No evidence of osteomyelitis. Electronically Signed   By: Ashley Royalty M.D.   On: 03/05/2017 20:19    Lab Data:  CBC:  Recent Labs Lab 03/13/17 0840 03/14/17 0505 03/14/17 1832 03/15/17 0815 03/17/17 0943  WBC 11.2* 12.1* 13.1*  12.7* 14.0*  HGB 12.5* 12.2* 11.6* 10.7* 10.7*  HCT 39.6 39.6 38.3* 34.4* 34.5*  MCV 85.7 86.8 88.2 87.3 88.7  PLT 300 223 205 187 034   Basic Metabolic Panel:  Recent Labs Lab 03/13/17 0840 03/14/17 0505 03/14/17 1832 03/15/17 0815 03/17/17 0943  NA 132* 136 138 136 135  K 4.5 4.0 4.4 3.9 4.2  CL 94* 96* 98* 97* 96*  CO2 '22 28 25 25 23  '$ GLUCOSE 112* 105* 110* 106* 122*  BUN 49* 32* 36* 44* 39*  CREATININE 7.37* 6.08* 6.39* 7.22* 6.80*  CALCIUM 8.5* 8.7* 8.5* 8.4* 8.6*  PHOS 5.0*  --  4.5 4.8* 3.9   GFR: Estimated Creatinine Clearance: 10.8 mL/min (A) (by C-G formula based on SCr of 6.8 mg/dL (H)). Liver Function Tests:  Recent Labs Lab 03/13/17 0840 03/14/17 1832 03/15/17 0815 03/17/17 0943  ALBUMIN 2.3* 2.4* 2.4* 2.4*   No results for input(s): LIPASE, AMYLASE in the last 168 hours. No results for input(s): AMMONIA in the last 168 hours. Coagulation Profile:  Recent Labs Lab 03/14/17 0505  INR 1.34   Cardiac Enzymes: No results for input(s): CKTOTAL, CKMB, CKMBINDEX, TROPONINI in the last 168 hours. BNP (last 3 results) No results for input(s): PROBNP in the last 8760 hours. HbA1C: No results for input(s): HGBA1C in the last 72 hours. CBG: No results for input(s): GLUCAP in the last 168 hours. Lipid Profile: No results for input(s): CHOL, HDL, LDLCALC,  TRIG, CHOLHDL, LDLDIRECT in the last 72 hours. Thyroid Function Tests: No results for input(s): TSH, T4TOTAL, FREET4, T3FREE, THYROIDAB in the last 72 hours. Anemia Panel: No results for input(s): VITAMINB12, FOLATE, FERRITIN, TIBC, IRON, RETICCTPCT in the last 72 hours. Urine analysis: No results found for: COLORURINE, APPEARANCEUR, LABSPEC, PHURINE, GLUCOSEU, HGBUR, BILIRUBINUR, KETONESUR, PROTEINUR, UROBILINOGEN, NITRITE, LEUKOCYTESUR   Chatara Lucente M.D. [facility] 03/18/2017, 12:21 PM  Pager: [phone] Between 7am to 7pm - call Pager - [phone]  After 7pm go to [email or web address] -  password [email or web address]  Call night coverage person covering after 7pm  .           [facility]                                                                              Patient Demographics  Willie Andrade, is a 66 y.o. male, DOB - [birth year], [id number]  Admit date - 03/05/2017   Admitting Physician Willie Baker, MD  Outpatient Primary MD for the patient is Willie Lei, MD  Outpatient specialists:   LOS - 12  days   Medical records reviewed and are as summarized below:    Chief Complaint  Patient presents with  . Abscess  . 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 No worsening drainage from his left toes. In the ED, WBC 24K, high lactate and pro-calcitonin, clinical picture consistent with cellulitis, no osteomyelitis. He was seen by home health RN who felt that his wounds were worse and was advised to come to the ED. Empirically started on IV vancomycin. Vascular surgery and nephrology were consulted.    Assessment & Plan     Recurrent left groin lymphocele - Status post excision of the lymphocele on 8/8 and drain in place which is still draining copious amounts - Vascular surgery following, operative cultures grew pseudomonas and patient placed on IV Fortaz, 10-14 days or per vascular surgery    PAD severe with left foot gangrene/purulent drainage from the left leg saphenectomy site - Vascular surgery following closely, left leg bypass is patent - Status  post debridement of incision and opening and debridement of proximal thigh incision and removal of staples and debridement of left groin incision today    ESRD (end stage renal disease) ([place]) - Nephrology consulted - on hemodialysis TTS    Pacemaker, Chronic systolic heart failure ([place]), 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 Blood cultures 2 negative. Vancomycin has been discontinued.  - Secondary to lower extremity wound infections, #  1 and #2    Severe protein-calorie malnutrition ([place]) - Nutrition consulted  Code Status: Full code DVT Prophylaxis:  Lovenox  Family Communication: Discussed in detail with the patient, all imaging results, lab results explained to the patient   Disposition Plan:   Time Spent in minutes 25 minutes  Procedures:    Consultants:   Nephrology   vascular surgery  Antimicrobials:      Medications  Scheduled Meds: . calcitRIOL  1.25 mcg Oral Q T,Th,Sat-1800  . calcium acetate  2,001 mg Oral TID WC  . cinacalcet  90 mg Oral Q T,Th,Sat-1800  . enoxaparin (LOVENOX) injection  30 mg Subcutaneous Q24H  . feeding supplement (PRO-STAT SUGAR FREE 64)  30 mL Oral BID  . gabapentin  100 mg Oral BID  . midodrine  10 mg Oral TID WC  . multivitamin  1 tablet Oral QHS  . senna  1 tablet Oral BID  . sodium chloride flush  3 mL Intravenous Q12H   Continuous Infusions: . cefTAZidime (FORTAZ)  IV Stopped (03/17/17 1748)   PRN Meds:.acetaminophen **OR** acetaminophen, HYDROmorphone (DILAUDID) injection, ondansetron **OR** ondansetron (ZOFRAN) IV, oxyCODONE-acetaminophen   Antibiotics   Anti-infectives    Start     Dose/Rate Route Frequency Ordered Stop   03/16/17 1800  cefTAZidime (FORTAZ) 1 g in dextrose 5 % 50 mL IVPB      1 g 100 mL/hr over 30 Minutes Intravenous Every 24 hours 03/16/17 0411     03/09/17 1830  cefTAZidime (FORTAZ) 1 g in dextrose 5 % 50 mL IVPB  Status:  Discontinued     1 g 100 mL/hr over 30 Minutes Intravenous Every 24 hours 03/09/17 1807 03/16/17 0334   03/07/17 0600  ceFAZolin (ANCEF) IVPB 1 g/50 mL premix  Status:  Discontinued    Comments:  Send with pt to OR   1 g 100 mL/hr over 30 Minutes Intravenous To Surgery 03/06/17 1021 03/07/17 1118   03/06/17 2018  Vancomycin (VANCOCIN) 750-5 MG/150ML-% IVPB    Comments:  Willie Andrade   : cabinet override      03/06/17 2018 03/06/17 2130   03/06/17 1200  vancomycin (VANCOCIN) IVPB 750 mg/150 ml premix  Status:  Discontinued     750 mg 150 mL/hr over 60 Minutes Intravenous Every T-Th-Sa (Hemodialysis) 03/06/17 0130 03/09/17 1755   03/05/17 2345  vancomycin (VANCOCIN) 1,500 mg in sodium chloride 0.9 % 500 mL IVPB     1,500 mg 250 mL/hr over 120 Minutes Intravenous  Once 03/05/17 2342 03/06/17 0229        Subjective:   Clary Baik was seen and examined today. Seen after the surgery. Patient denies dizziness, chest pain, shortness of breath, abdominal pain, N/V/D/C, new weakness, numbess, tingling. No acute events overnight.    Objective:   Vitals:   03/17/17 1844 03/17/17 2125 03/18/17 0538 03/18/17 1000  BP: (!) 106/54 (!) 108/52 116/73 (!) 92/51  Pulse: 79 99 90 86  Resp: '16 19 20 18  '$ Temp: 98.3 F (36.8 C) 98.7 F (37.1 C) 98.6 F (37 C) 98.9 F (37.2 C)  TempSrc: Oral Oral Oral Oral  SpO2: 98% 90% 90% 98%  Weight:  71.2 kg (156 lb 15.5 oz)    Height:        Intake/Output Summary (Last 24 hours) at 03/18/17 1221 Last data filed at 03/18/17 0623  Gross per 24 hour  Intake              240 ml  Output  1000 ml  Net             -760 ml     Wt Readings from Last 3 Encounters:  03/17/17 71.2 kg (156 lb 15.5 oz)  02/27/17 72.6 kg (160 lb)  02/13/17 74.4 kg (164 lb 0.4 oz)     Exam  General: Alert  and oriented, NAD, appears comfortable   Eyes:   HEENT:    Cardiovascular: S1 S2 auscultated, no rubs, murmurs or gallops. Regular rate and rhythm.  Respiratory: Clear to auscultation bilaterally, no wheezing, rales or rhonchi  Gastrointestinal: Soft, nontender, nondistended, + bowel sounds  Ext: chronic dry changes in left toe and foot  Neuro:  No FND  Musculoskeletal: No digital cyanosis, clubbing  Skin: No rashes  Psych: alert and comfortable    Data Reviewed:  I have personally reviewed following labs and imaging studies  Micro Results Recent Results (from the past 240 hour(s))  Anaerobic culture     Status: None (Preliminary result)   Collection Time: 03/14/17 11:32 AM  Result Value Ref Range Status   Specimen Description WOUND LEFT THIGH  Final   Special Requests VEIN HARVEST INCISION POF FORTAZ  Final   Culture   Final    NO ANAEROBES ISOLATED; CULTURE IN PROGRESS FOR 5 DAYS   Report Status PENDING  Incomplete  Aerobic Culture (superficial specimen)     Status: None   Collection Time: 03/14/17 11:32 AM  Result Value Ref Range Status   Specimen Description WOUND LEFT THIGH  Final   Special Requests VEIN HARVEST INCISION POF FORTAZ  Final   Gram Stain   Final    RARE WBC PRESENT,BOTH PMN AND MONONUCLEAR NO ORGANISMS SEEN    Culture RARE PSEUDOMONAS AERUGINOSA  Final   Report Status 03/17/2017 FINAL  Final   Organism ID, Bacteria PSEUDOMONAS AERUGINOSA  Final      Susceptibility   Pseudomonas aeruginosa - MIC*    CEFTAZIDIME 4 SENSITIVE Sensitive     CIPROFLOXACIN <=0.25 SENSITIVE Sensitive     GENTAMICIN 2 SENSITIVE Sensitive     IMIPENEM 2 SENSITIVE Sensitive     PIP/TAZO 8 SENSITIVE Sensitive     CEFEPIME 2 SENSITIVE Sensitive     * RARE PSEUDOMONAS AERUGINOSA    Radiology Reports Dg Chest 2 View  Result Date: 03/05/2017 CLINICAL DATA:  Bilateral foot wounds, hypotension and elevated lactic acid level. EXAM: CHEST  2 VIEW COMPARISON:  02/11/2017  FINDINGS: Stable moderate cardiac enlargement and radiographic appearance of a biventricular pacing/ICD device. There is no evidence of pulmonary edema, consolidation, pneumothorax, nodule or pleural fluid. The bony thorax is unremarkable. IMPRESSION: Stable cardiomegaly.  No acute findings in the chest. Electronically Signed   By: Aletta Edouard M.D.   On: 03/05/2017 18:20   Dg Ankle Complete Left  Result Date: 03/05/2017 CLINICAL DATA:  Possible infection.  Soft tissue swelling. EXAM: LEFT ANKLE COMPLETE - 3+ VIEW COMPARISON:  None. FINDINGS: The bones are demineralized. There is no acute fracture dislocations involving the left ankle. Dorsal calcaneal enthesophyte is noted. The ankle and subtalar joints are maintained. Vascular calcifications are noted crossing the ankle joint which can be seen with diabetes. Diffuse soft tissue swelling and thickening is noted which may reflect chronic venous insufficiency, third spacing of fluid or possibly stigmata of a cellulitis. No bone destruction is noted. IMPRESSION: 1. Nonspecific diffuse soft tissue swelling of the included left leg and ankle possibly representing chronic venous insufficiency, third spacing of fluid or possibly cellulitis. 2.  No evidence of osteomyelitis. 3. Osteopenia. Electronically Signed   By: Ashley Royalty M.D.   On: 03/05/2017 20:13   Dg Foot Complete Left  Result Date: 03/05/2017 CLINICAL DATA:  Possible infection EXAM: LEFT FOOT - COMPLETE 3+ VIEW COMPARISON:  None. FINDINGS: Diffuse soft tissue swelling is noted, nonspecific but possibly representing stigmata of cellulitis, third spacing of fluid or possibly from chronic venous insufficiency. Bones are demineralized in appearance without frank bone destruction. Mild degenerative joint space narrowing is seen of the toes. No acute fracture joint dislocations. Dorsal calcaneal enthesophyte is seen. Ankle mortise and subtalar articulations are maintained as are the midfoot joints.  IMPRESSION: 1. Osteopenic appearance of the left foot without fracture or frank bone destruction. 2. Soft tissue swelling of the included leg, ankle and foot. Electronically Signed   By: Ashley Royalty M.D.   On: 03/05/2017 20:16   Dg Foot Complete Right  Result Date: 03/05/2017 CLINICAL DATA:  Possible infection. EXAM: RIGHT FOOT COMPLETE - 3+ VIEW COMPARISON:  None. FINDINGS: Diffuse soft tissue swelling of the included leg, ankle and foot especially over the dorsum of the forefoot. No underlying bone destruction or fracture. No joint dislocations. Bones are demineralized in appearance. Dorsal calcaneal enthesophyte is seen. IMPRESSION: 1. Nonspecific soft tissue swelling of the included leg, ankle and foot. Findings could be due to third spacing of fluid, cellulitis or possibly chronic venous insufficiency. 2. No underlying fracture. 3. No evidence of osteomyelitis. Electronically Signed   By: Ashley Royalty M.D.   On: 03/05/2017 20:19    Lab Data:  CBC:  Recent Labs Lab 03/13/17 0840 03/14/17 0505 03/14/17 1832 03/15/17 0815 03/17/17 0943  WBC 11.2* 12.1* 13.1* 12.7* 14.0*  HGB 12.5* 12.2* 11.6* 10.7* 10.7*  HCT 39.6 39.6 38.3* 34.4* 34.5*  MCV 85.7 86.8 88.2 87.3 88.7  PLT 300 223 205 187 740   Basic Metabolic Panel:  Recent Labs Lab 03/13/17 0840 03/14/17 0505 03/14/17 1832 03/15/17 0815 03/17/17 0943  NA 132* 136 138 136 135  K 4.5 4.0 4.4 3.9 4.2  CL 94* 96* 98* 97* 96*  CO2 '22 28 25 25 23  '$ GLUCOSE 112* 105* 110* 106* 122*  BUN 49* 32* 36* 44* 39*  CREATININE 7.37* 6.08* 6.39* 7.22* 6.80*  CALCIUM 8.5* 8.7* 8.5* 8.4* 8.6*  PHOS 5.0*  --  4.5 4.8* 3.9   GFR: Estimated Creatinine Clearance: 10.8 mL/min (A) (by C-G formula based on SCr of 6.8 mg/dL (H)). Liver Function Tests:  Recent Labs Lab 03/13/17 0840 03/14/17 1832 03/15/17 0815 03/17/17 0943  ALBUMIN 2.3* 2.4* 2.4* 2.4*   No results for input(s): LIPASE, AMYLASE in the last 168 hours. No results for  input(s): AMMONIA in the last 168 hours. Coagulation Profile:  Recent Labs Lab 03/14/17 0505  INR 1.34   Cardiac Enzymes: No results for input(s): CKTOTAL, CKMB, CKMBINDEX, TROPONINI in the last 168 hours. BNP (last 3 results) No results for input(s): PROBNP in the last 8760 hours. HbA1C: No results for input(s): HGBA1C in the last 72 hours. CBG: No results for input(s): GLUCAP in the last 168 hours. Lipid Profile: No results for input(s): CHOL, HDL, LDLCALC, TRIG, CHOLHDL, LDLDIRECT in the last 72 hours. Thyroid Function Tests: No results for input(s): TSH, T4TOTAL, FREET4, T3FREE, THYROIDAB in the last 72 hours. Anemia Panel: No results for input(s): VITAMINB12, FOLATE, FERRITIN, TIBC, IRON, RETICCTPCT in the last 72 hours. Urine analysis: No results found for: COLORURINE, APPEARANCEUR, LABSPEC, [place], GLUCOSEU, HGBUR, BILIRUBINUR, KETONESUR, PROTEINUR, UROBILINOGEN, NITRITE,  Corliss Skains M.D. [facility] 03/18/2017, 12:21 PM  Pager: [phone] Between 7am to 7pm - call Pager - [phone]  After 7pm go to [email or web address] - password [email or web address]  Call night coverage person covering after 7pm

## 2017-03-18 NOTE — Consult Note (Signed)
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  Needs placement for wound care most likely (he refuses SNF saying it is full of drug addicts. I assured him this was not the case).  appreciate vascular care of pt.   Available as needed.   Thank you so much for this interesting consult,   Bobby Rumpf (pager) [phone] [email or web address]  Willie Andrade is an 67 y.o. male.  HPI: 66 yo M with hx of ESRD, systolic CHF, cardiomyopathy (EF 15-20%), AICD, hypotension on midodrine.  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  He has also had gangrenous changes of his L foot.  He was seen by his home health aide on 8-7 and advised to come to ED for worsening of his wounds. WBC 24.3.  He was taken to OR on 8-8 and underwent excision of his L groin lymphocele. Turbid fluid was found around the site and was sent for Cx Pseudomonas (pan-sens). He was also noted to have drainage at his LLE saphenectomy site. He was started on ceftaz with HD.  He returned to OR on 8-15 and had debridement of LLE fem-pop site. Cx pseudomonas (pan-sens).    Past Medical History:  Diagnosis Date  . Chronic systolic CHF (congestive heart failure), NYHA class 2 ([place])   . ESRD (end stage renal disease) on dialysis ([place])   . Hepatitis B   . HTN (hypertension)   . Hyperlipidemia   . 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 Peripheral vascular disease ([place])   . Protein-calorie malnutrition, severe ([place])   . Tobacco abuse     Past Surgical History:    Procedure Laterality Date  . ABDOMINAL AORTOGRAM W/LOWER EXTREMITY N/A 01/22/2017   Procedure: Abdominal Aortogram w/Lower Extremity;  Surgeon: Rosetta Posner, MD;  Location: [facility];  Service: Cardiovascular;  Laterality: N/A;  . BIV ICD GENERATOR CHANGEOUT N/A 11/28/2016   Procedure: BiV ICD [employer]; St Jude Surgeon: Will Meredith Leeds, MD;  Location: [facility];  Service: Cardiovascular;  Laterality: N/A;  . BYPASS GRAFT FEMORAL-PERONEAL Left 01/24/2017   Procedure: Left Leg  FEMORAL-PERONEAL Bypass Graft;  Surgeon: Rosetta Posner, MD;  Location: [facility];  Service: Vascular;  Laterality: Left;  . DRAINAGE AND CLOSURE OF LYMPHOCELE Left 03/07/2017   Procedure: EXCISION AND CLOSURE OF LEFT GROIN LYMPHOCELE;  Surgeon: Conrad Troy, MD;  Location: [facility] OR;  Service: Vascular;  Laterality: Left;  . I&D EXTREMITY Left 03/14/2017   Procedure: IRRIGATION AND DEBRIDEMENT LEFT GROIN AND THIGH INCISIONS and LOWER LEG INCISION;  Surgeon: Rosetta Posner, MD;  Location: [facility];  Service: Vascular;  Laterality: Left;     Allergies  Allergen Reactions  . No Known Allergies     Medications:  Scheduled: . calcitRIOL  1.25 mcg Oral Q T,Th,Sat-1800  . calcium acetate  2,001 mg Oral TID WC  . cinacalcet  90 mg Oral Q T,Th,Sat-1800  . enoxaparin (LOVENOX) injection  30 mg Subcutaneous Q24H  . feeding supplement (PRO-STAT SUGAR FREE 64)  30 mL Oral  BID  . gabapentin  100 mg Oral BID  . midodrine  10 mg Oral TID WC  . multivitamin  1 tablet Oral QHS  . senna  1 tablet Oral BID  . sodium chloride flush  3 mL Intravenous Q12H    Abtx:  Anti-infectives    Start     Dose/Rate Route Frequency Ordered Stop   03/16/17 1800  cefTAZidime (FORTAZ) 1 g in dextrose 5 % 50 mL IVPB     1 g 100 mL/hr over 30 Minutes Intravenous Every 24 hours 03/16/17 0411     03/09/17 1830  cefTAZidime (FORTAZ) 1 g in dextrose 5 % 50 mL IVPB  Status:  Discontinued     1 g 100 mL/hr over 30 Minutes  Intravenous Every 24 hours 03/09/17 1807 03/16/17 0334   03/07/17 0600  ceFAZolin (ANCEF) IVPB 1 g/50 mL premix  Status:  Discontinued    Comments:  Send with pt to OR   1 g 100 mL/hr over 30 Minutes Intravenous To Surgery 03/06/17 1021 03/07/17 1118   03/06/17 2018  Vancomycin (VANCOCIN) 750-5 MG/150ML-% IVPB    Comments:  Kiang, Angelito   : cabinet override      03/06/17 2018 03/06/17 2130   03/06/17 1200  vancomycin (VANCOCIN) IVPB 750 mg/150 ml premix  Status:  Discontinued     750 mg 150 mL/hr over 60 Minutes Intravenous Every T-Th-Sa (Hemodialysis) 03/06/17 0130 03/09/17 1755   03/05/17 2345  vancomycin (VANCOCIN) 1,500 mg in sodium chloride 0.9 % 500 mL IVPB     1,500 mg 250 mL/hr over 120 Minutes Intravenous  Once 03/05/17 2342 03/06/17 0229      Total days of antibiotics: 12 (ceftaz day 9)          Social History:  reports that he has been smoking Cigarettes.  He has a 40.00 pack-year smoking history. He has never used smokeless tobacco. He reports that he does not drink alcohol or use drugs.  Family History  Problem Relation Age of Onset  . Diabetes Mother   . Gout Mother   . Stroke Father   . Gout Maternal Grandmother   . Gout Sister   . Asthma Brother     History obtained from chart review and the patient General ROS: eating well. no urine. norma BM. no problems with HD fistula. continued pain in LE. denies f/c. see HPI.   Blood pressure (!) 92/51, pulse 86, temperature 98.9 F (37.2 C), temperature source Oral, resp. rate 18, height '6\' 2"'$  (1.88 m), weight 71.2 kg (156 lb 15.5 oz), SpO2 98 %. General appearance: alert, cooperative and no distress Eyes: negative findings: conjunctivae and sclerae normal, pupils equal, round, reactive to light and accomodation and B arcus senilus Throat: normal findings: oropharynx pink & moist without lesions or evidence of thrush Neck: no adenopathy and supple, symmetrical, trachea midline Lungs: clear to auscultation  bilaterally Heart: regular rate and rhythm Abdomen: normal findings: bowel sounds normal and soft, non-tender Extremities: LUE fistla- clean, +bruit. L goin wound clean, tender. no d/c. L thigh wound clean, no d/c, non-tender. L foot- dry gangrene of toes, tender. R foot- dry gangrene of toes, tender. wound on sole of foot. wound between 1st and 2nd toes.    Results for orders placed or performed during the hospital encounter of 03/05/17 (from the past 48 hour(s))  CBC     Status: Abnormal   Collection Time: 03/17/17  9:43 AM  Result Value Ref Range   WBC 14.0 (  H) 4.0 - 10.5 K/uL   RBC 3.89 (L) 4.22 - 5.81 MIL/uL   Hemoglobin 10.7 (L) 13.0 - 17.0 g/dL   HCT 34.5 (L) 39.0 - 52.0 %   MCV 88.7 78.0 - 100.0 fL   MCH 27.5 26.0 - 34.0 pg   MCHC 31.0 30.0 - 36.0 g/dL   RDW 20.6 (H) 11.5 - 15.5 %   Platelets 187 150 - 400 K/uL  Renal function panel     Status: Abnormal   Collection Time: 03/17/17  9:43 AM  Result Value Ref Range   Sodium 135 135 - 145 mmol/L   Potassium 4.2 3.5 - 5.1 mmol/L   Chloride 96 (L) 101 - 111 mmol/L   CO2 23 22 - 32 mmol/L   Glucose, Bld 122 (H) 65 - 99 mg/dL   BUN 39 (H) 6 - 20 mg/dL   Creatinine, Ser 6.80 (H) 0.61 - 1.24 mg/dL   Calcium 8.6 (L) 8.9 - 10.3 mg/dL   Phosphorus 3.9 2.5 - 4.6 mg/dL   Albumin 2.4 (L) 3.5 - 5.0 g/dL   GFR calc non Af Amer 8 (L) >60 mL/min   GFR calc Af Amer 9 (L) >60 mL/min    Comment: (NOTE) The eGFR has been calculated using the CKD EPI equation. This calculation has not been validated in all clinical situations. eGFR's persistently <60 mL/min signify possible Chronic Kidney Disease.    Anion gap 16 (H) 5 - 15      Component Value Date/Time   SDES WOUND LEFT THIGH 03/14/2017 1132   SDES WOUND LEFT THIGH 03/14/2017 1132   SPECREQUEST VEIN HARVEST INCISION POF FORTAZ 03/14/2017 1132   SPECREQUEST VEIN HARVEST INCISION POF FORTAZ 03/14/2017 1132   CULT  03/14/2017 1132    NO ANAEROBES ISOLATED; CULTURE IN PROGRESS FOR 5  DAYS   CULT RARE PSEUDOMONAS AERUGINOSA 03/14/2017 1132   REPTSTATUS PENDING 03/14/2017 1132   REPTSTATUS 03/17/2017 FINAL 03/14/2017 1132   No results found. Recent Results (from the past 240 hour(s))  Anaerobic culture     Status: None (Preliminary result)   Collection Time: 03/14/17 11:32 AM  Result Value Ref Range Status   Specimen Description WOUND LEFT THIGH  Final   Special Requests VEIN HARVEST INCISION POF FORTAZ  Final   Culture   Final    NO ANAEROBES ISOLATED; CULTURE IN PROGRESS FOR 5 DAYS   Report Status PENDING  Incomplete  Aerobic Culture (superficial specimen)     Status: None   Collection Time: 03/14/17 11:32 AM  Result Value Ref Range Status   Specimen Description WOUND LEFT THIGH  Final   Special Requests VEIN HARVEST INCISION POF FORTAZ  Final   Gram Stain   Final    RARE WBC PRESENT,BOTH PMN AND MONONUCLEAR NO ORGANISMS SEEN    Culture RARE PSEUDOMONAS AERUGINOSA  Final   Report Status 03/17/2017 FINAL  Final   Organism ID, Bacteria PSEUDOMONAS AERUGINOSA  Final      Susceptibility   Pseudomonas aeruginosa - MIC*    CEFTAZIDIME 4 SENSITIVE Sensitive     CIPROFLOXACIN <=0.25 SENSITIVE Sensitive     GENTAMICIN 2 SENSITIVE Sensitive     IMIPENEM 2 SENSITIVE Sensitive     PIP/TAZO 8 SENSITIVE Sensitive     CEFEPIME 2 SENSITIVE Sensitive     * RARE PSEUDOMONAS AERUGINOSA      03/18/2017, 1:49 PM     LOS: 12 days    Records and images were personally reviewed where available.  Bobby Rumpf, MD  [facility] [phone] 03/18/2017, 1:49 PM

## 2017-03-19 LAB — ANAEROBIC CULTURE

## 2017-03-19 MED ORDER — DEXTROSE 5 % IV SOLN
2.0000 g | INTRAVENOUS | Status: DC
  Administered 2017-03-20: 2 g via INTRAVENOUS
  Filled 2017-03-19 (×2): qty 2

## 2017-03-19 NOTE — Progress Notes (Signed)
Dressing changed per order. No complications. Will continue to monitor.  

## 2017-03-19 NOTE — Care Management Important Message (Signed)
Important Message  Patient Details  Name: Willie Andrade MRN: [id number] Date of Birth: June 29, 1951   Medicare Important Message Given:  Yes    Willie Andrade [name] 03/19/2017, 11:13 AM

## 2017-03-19 NOTE — Progress Notes (Signed)
PHARMACY NOTE:  ANTIMICROBIAL RENAL DOSAGE ADJUSTMENT  Current antimicrobial dosage:  Ceftazidime 1g IV q24h as patient was off HD schedule  Indication: wound infection  Renal Function: Estimated Creatinine Clearance: 10.8 mL/min (A) (by C-G formula based on SCr of 6.8 mg/dL (H)). [x]      On intermittent HD, scheduled: TTSat []      On CRRT    Antimicrobial dosage has been changed to:  Ceftazidime 2g IV qTTSat @ 1800  Additional comments: per ID recommendations, patient to complete 3 total weeks of ceftazidime. A stop date has been entered- last dose of ceftazidime due 03/29/2017.   Thank you for allowing pharmacy to be a part of this patient's care.  Genaro Bekker D. Sherrika Weakland, PharmD, BCPS Clinical Pharmacist Pager: [phone] Clinical Phone for 03/19/2017 until 3:30pm: [phone] If after 3:30pm, please call main pharmacy at [phone] 03/19/2017 11:34 AM

## 2017-03-19 NOTE — Progress Notes (Signed)
Progress Note  SUBJECTIVE:    Complaining that he doesn't get enough rest in the hospital.  OBJECTIVE:   Vitals:   03/19/17 0533 03/19/17 0754  BP: 125/69 116/72  Pulse: 87 91  Resp: 20 18  Temp: 98.3 F (36.8 C) 98.5 F (36.9 C)  SpO2: 100% 93%    Intake/Output Summary (Last 24 hours) at 03/19/17 5427 Last data filed at 03/19/17 0617  Gross per 24 hour  Intake                0 ml  Output                0 ml  Net                0 ml   Left groin incision clean with nylon sutures intact. No drainage seen. Left below knee incision clean with fibrinous exudate.  Palpable left subcutaneous graft pulse.  Dry gangrene left toes.   ASSESSMENT/PLAN:   66 y.o. male is s/p: Debridement of Incision and opening and debridement of proximal thigh incision and removal of staples and debridement of left groin incision 5 Days Post-Op   Wounds are healing. Will need continued wound care. ID was consulted recommending 3 additional weeks of fortaz. Ok from vascular standpoint for d/c.   Raymond Gurney 03/19/2017 8:08 AM -- LABS:   CBC    Component Value Date/Time   WBC 14.0 (H) 03/17/2017 0943   HGB 10.7 (L) 03/17/2017 0943   HCT 34.5 (L) 03/17/2017 0943   PLT 187 03/17/2017 0943    BMET    Component Value Date/Time   NA 135 03/17/2017 0943   K 4.2 03/17/2017 0943   CL 96 (L) 03/17/2017 0943   CO2 23 03/17/2017 0943   GLUCOSE 122 (H) 03/17/2017 0943   BUN 39 (H) 03/17/2017 0943   CREATININE 6.80 (H) 03/17/2017 0943   CALCIUM 8.6 (L) 03/17/2017 0943   GFRNONAA 8 (L) 03/17/2017 0943   GFRAA 9 (L) 03/17/2017 0943    COAG Lab Results  Component Value Date   INR 1.34 03/14/2017   INR 1.29 03/07/2017   INR 1.36 03/05/2017   No results found for: PTT  ANTIBIOTICS:   Anti-infectives    Start     Dose/Rate Route Frequency Ordered Stop   03/16/17 1800  cefTAZidime (FORTAZ) 1 g in dextrose 5 % 50 mL IVPB     1 g 100 mL/hr over 30 Minutes Intravenous Every 24  hours 03/16/17 0411     03/09/17 1830  cefTAZidime (FORTAZ) 1 g in dextrose 5 % 50 mL IVPB  Status:  Discontinued     1 g 100 mL/hr over 30 Minutes Intravenous Every 24 hours 03/09/17 1807 03/16/17 0334   03/07/17 0600  ceFAZolin (ANCEF) IVPB 1 g/50 mL premix  Status:  Discontinued    Comments:  Send with pt to OR   1 g 100 mL/hr over 30 Minutes Intravenous To Surgery 03/06/17 1021 03/07/17 1118   03/06/17 2018  Vancomycin (VANCOCIN) 750-5 MG/150ML-% IVPB    Comments:  Kiang, Angelito   : cabinet override      03/06/17 2018 03/06/17 2130   03/06/17 1200  vancomycin (VANCOCIN) IVPB 750 mg/150 ml premix  Status:  Discontinued     750 mg 150 mL/hr over 60 Minutes Intravenous Every T-Th-Sa (Hemodialysis) 03/06/17 0130 03/09/17 1755   03/05/17 2345  vancomycin (VANCOCIN) 1,500 mg in sodium chloride 0.9 % 500 mL IVPB  1,500 mg 250 mL/hr over 120 Minutes Intravenous  Once 03/05/17 2342 03/06/17 0229       Maris Berger, PA-C [facility] Office: [phone] Pager: [phone] 03/19/2017 8:08 AM

## 2017-03-19 NOTE — Progress Notes (Signed)
Patient lost IV access. IV team to place one. Night shift RN notified about administration of IV antibiotic.

## 2017-03-19 NOTE — Progress Notes (Signed)
SW states that patient needs PT consult. MD notified. Orders placed. Will continue to monitor.

## 2017-03-19 NOTE — Progress Notes (Signed)
[facility] Progress Note   Subjective: Says he needs to call "pacemaker doctor" because he missed appt D/T being in hospital. No other complaints. Possible DC today. If so will have HD at [facility] tomorrow.   Objective Vitals:   03/18/17 2051 03/19/17 0533 03/19/17 0754 03/19/17 0938  BP: (!) 117/52 125/69 116/72 120/75  Pulse: 82 87 91 89  Resp: 19 20 18 18   Temp: 98.3 F (36.8 C) 98.3 F (36.8 C) 98.5 F (36.9 C) 98 F (36.7 C)  TempSrc: Oral Oral Oral Oral  SpO2: 100% 100% 93% 98%  Weight:      Height:       Physical Exam General: chronically ill appearing male in NAD Heart: S1,S2, 2/6 systolic M Lungs: CTAB A/P Abdomen: Active BS. L groin incision with drsg CDI.  Extremities: woody appearance LE. Drsgs L leg CDI. Has pedal edema L > R.  Dialysis Access: LFA AVF + bruit   Additional Objective Labs: Basic Metabolic Panel:  Recent Labs Lab 03/14/17 1832 03/15/17 0815 03/17/17 0943  NA 138 136 135  K 4.4 3.9 4.2  CL 98* 97* 96*  CO2 25 25 23   GLUCOSE 110* 106* 122*  BUN 36* 44* 39*  CREATININE 6.39* 7.22* 6.80*  CALCIUM 8.5* 8.4* 8.6*  PHOS 4.5 4.8* 3.9   Liver Function Tests:  Recent Labs Lab 03/14/17 1832 03/15/17 0815 03/17/17 0943  ALBUMIN 2.4* 2.4* 2.4*   No results for input(s): LIPASE, AMYLASE in the last 168 hours. CBC:  Recent Labs Lab 03/13/17 0840 03/14/17 0505 03/14/17 1832 03/15/17 0815 03/17/17 0943  WBC 11.2* 12.1* 13.1* 12.7* 14.0*  HGB 12.5* 12.2* 11.6* 10.7* 10.7*  HCT 39.6 39.6 38.3* 34.4* 34.5*  MCV 85.7 86.8 88.2 87.3 88.7  PLT 300 223 205 187 187   Blood Culture    Component Value Date/Time   SDES WOUND LEFT THIGH 03/14/2017 1132   SDES WOUND LEFT THIGH 03/14/2017 1132   SPECREQUEST VEIN HARVEST INCISION POF FORTAZ 03/14/2017 1132   SPECREQUEST VEIN HARVEST INCISION POF FORTAZ 03/14/2017 1132   CULT  03/14/2017 1132    NO ANAEROBES ISOLATED; CULTURE IN PROGRESS FOR 5 DAYS   CULT RARE  PSEUDOMONAS AERUGINOSA 03/14/2017 1132   REPTSTATUS PENDING 03/14/2017 1132   REPTSTATUS 03/17/2017 FINAL 03/14/2017 1132    Cardiac Enzymes: No results for input(s): CKTOTAL, CKMB, CKMBINDEX, TROPONINI in the last 168 hours. CBG: No results for input(s): GLUCAP in the last 168 hours. Iron Studies: No results for input(s): IRON, TIBC, TRANSFERRIN, FERRITIN in the last 72 hours. @lablastinr3 @ Studies/Results: No results found. Medications: . cefTAZidime (FORTAZ)  IV Stopped (03/18/17 1730)   . calcitRIOL  1.25 mcg Oral Q T,Th,Sat-1800  . calcium acetate  2,001 mg Oral TID WC  . cinacalcet  90 mg Oral Q T,Th,Sat-1800  . enoxaparin (LOVENOX) injection  30 mg Subcutaneous Q24H  . feeding supplement (PRO-STAT SUGAR FREE 64)  30 mL Oral BID  . gabapentin  100 mg Oral BID  . midodrine  10 mg Oral TID WC  . multivitamin  1 tablet Oral QHS  . senna  1 tablet Oral BID  . sodium chloride flush  3 mL Intravenous Q12H     Dialysis Orders:  TTS at [facility] 3:45hr, BFR 400, DFR 800, EDW 72kg, 2K/2Ca bath, AVF, Heparin 1400 bolus - Calcitriol 1.53mcg PO q HD - Venofer 100mg  x 10 ordered (4 given so far) for tsat 20% on 7/26. - Was getting sensipar 180mg  PO  q HD (held on 7/28 d/t low Ca)  Assessment/Plan: 1. Sepsis 2/2 LLE and R foot wound infection/cellulitis: Now on IV Fortaz  wound cult L groin + pseudomonas. Will need to continue for 3 weeks Elita Quick to be completed on Sept 1st.  2. PAD/L foot gangrene: VVS following. Patient declining amputations. Stable for DC from vascular prospective 3. Recurrent L groin lymphocele: S/p I&D thigh incision debridement of L groin 8/15 ; Repeat cult pending  2. ESRD -T,Th,S. Next HD tomorrow. K+ 4.2 3. Anemia - HGB 10.7  Follow HGB  4. Secondary hyperparathyroidism -  Cont binders/VDRA 5. HTN/volume - BP controlled, on soft side. Cont midodrine.  HD 03/17/17 Net UF 1.0 liter post wt 71.4 kg. Still has pedal edema. Lower EDW on DC.  6.  Nutrition - Renal diet/vitamins   Rita H. Brown NP-C 03/19/2017, 10:29 AM  [facility] [phone]  Pt seen, examined and agree w A/P as above.  Vinson Moselle MD [employer] pager [phone]   03/19/2017, 1:14 PM

## 2017-03-19 NOTE — [place] FL2 (Signed)
[place] MEDICAID FL2 LEVEL OF CARE SCREENING TOOL     IDENTIFICATION  Patient Name: Willie Andrade Birthdate: [birth year] Sex: male Admission Date (Current Location): 03/05/2017  [facility] and [place] Number:  Producer, television/film/video and Address:  [facility], [address] [place], [place] [address]      Provider Number: [id number]  Attending Physician Name and Address:  Cathren Harsh, MD  Relative Name and Phone Number:  Jujhar Everett - sister    Current Level of Care: Hospital Recommended Level of Care: Skilled Nursing Facility Prior Approval Number:    Date Approved/Denied:   PASRR Number: [id number] (eff. 03/19/17)  Discharge Plan: SNF    Current Diagnoses: Patient Active Problem List   Diagnosis Date Noted  . ESRD (end stage renal disease) on dialysis (HCC) 03/16/2017  . PVD (peripheral vascular disease) (HCC) 03/05/2017  . Wound infection 03/05/2017  . Narcotic overdose 02/11/2017  . Sepsis (HCC) 02/11/2017  . Severe protein-calorie malnutrition (HCC) 02/11/2017  . General weakness 01/26/2017  . Wound dehiscence 01/26/2017  . Hypotension   . Cellulitis of left foot 01/17/2017  . Cellulitis 01/17/2017  . Gangrene of toe of left foot (HCC)   . Pre-syncope 12/21/2016  . Chronic systolic heart failure (HCC) 10/11/2016  . 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 Pacemaker 10/10/2016  . ESRD (end stage renal disease) (HCC) 02/14/2016    Orientation RESPIRATION BLADDER Height & Weight     Self, Time, Situation, Place  Normal   Weight: 156 lb 15.5 oz (71.2 kg) Height:  6\' 2"  (188 cm)  BEHAVIORAL SYMPTOMS/MOOD NEUROLOGICAL BOWEL NUTRITION STATUS      Continent Diet (Renal with 1200 mL fluid restriction)  AMBULATORY STATUS COMMUNICATION OF NEEDS Skin    (Not evaluated by PT yet.) Verbally  (Gangrene of left foot. Toes with black eschar. Left calf with chronic full  thickness wound. Left anterior ankle with partial thickness wound. Lymphocele in groin. Incision in groin and leg)                       Personal Care Assistance Level of Assistance  Bathing, Feeding, Dressing   Feeding assistance: Independent       Functional Limitations Info  Sight, Hearing, Speech Sight Info: Adequate Hearing Info: Adequate Speech Info: Adequate    SPECIAL CARE FACTORS FREQUENCY  PT (By licensed PT) (Has not been evaluated by PT as of 8/20)                    Contractures Contractures Info: Not present    Additional Factors Info  Code Status, Allergies Code Status Info: Full Allergies Info: No known allergies           Current Medications (03/19/2017):  This is the current hospital active medication list Current Facility-Administered Medications  Medication Dose Route Frequency Provider Last Rate Last Dose  . acetaminophen (TYLENOL) tablet 650 mg  650 mg Oral Q6H PRN Rai, Ripudeep K, MD   650 mg at 03/17/17 1034   Or  . acetaminophen (TYLENOL) suppository 650 mg  650 mg Rectal Q6H PRN Rai, Ripudeep K, MD      . calcitRIOL (ROCALTROL) capsule 1.25 mcg  1.25 mcg Oral Q T,Th,Sat-1800 Rai, Ripudeep K, MD   1.25 mcg at 03/17/17 1800  . calcium acetate (PHOSLO) capsule 2,001 mg  2,001 mg Oral TID WC Rai, Delene Ruffini, MD  2,001 mg at 03/19/17 1730  . cefTAZidime (FORTAZ) 1 g in dextrose 5 % 50 mL IVPB  1 g Intravenous Q24H Bajbus, Lauren D, RPH   Stopped at 03/18/17 1730  . [START ON 03/20/2017] cefTAZidime (FORTAZ) 2 g in dextrose 5 % 50 mL IVPB  2 g Intravenous Q T,Th,Sat-1800 Bajbus, Lauren D, RPH      . cinacalcet (SENSIPAR) tablet 90 mg  90 mg Oral Q T,Th,Sat-1800 Rai, Ripudeep K, MD   90 mg at 03/17/17 1801  . enoxaparin (LOVENOX) injection 30 mg  30 mg Subcutaneous Q24H Rai, Ripudeep K, MD   30 mg at 03/19/17 1011  . feeding supplement (PRO-STAT SUGAR FREE 64) liquid 30 mL  30 mL Oral BID Rhetta Mura, MD   30 mL at 03/19/17 1731  .  gabapentin (NEURONTIN) capsule 100 mg  100 mg Oral BID Rai, Ripudeep K, MD   100 mg at 03/19/17 1012  . HYDROmorphone (DILAUDID) injection 1 mg  1 mg Intravenous Q4H PRN Rai, Ripudeep K, MD   1 mg at 03/17/17 1718  . midodrine (PROAMATINE) tablet 10 mg  10 mg Oral TID WC Rai, Ripudeep K, MD   10 mg at 03/19/17 1729  . multivitamin (RENA-VIT) tablet 1 tablet  1 tablet Oral QHS Rai, Ripudeep K, MD   1 tablet at 03/18/17 2310  . ondansetron (ZOFRAN) tablet 4 mg  4 mg Oral Q6H PRN Rai, Ripudeep K, MD       Or  . ondansetron (ZOFRAN) injection 4 mg  4 mg Intravenous Q6H PRN Rai, Ripudeep K, MD      . oxyCODONE-acetaminophen (PERCOCET/ROXICET) 5-325 MG per tablet 1-2 tablet  1-2 tablet Oral Q6H PRN Rai, Delene Ruffini, MD   2 tablet at 03/19/17 0616  . senna (SENOKOT) tablet 8.6 mg  1 tablet Oral BID Rai, Ripudeep K, MD   8.6 mg at 03/19/17 1013  . sodium chloride flush (NS) 0.9 % injection 3 mL  3 mL Intravenous Q12H Rai, Ripudeep K, MD   3 mL at 03/19/17 1014     Discharge Medications: Please see discharge summary for a list of discharge medications.  Relevant Imaging Results:  Relevant Lab Results:   Additional Information ss#[id number].  Dialysis patient TTS at [facility]  [place], [place], [place]

## 2017-03-19 NOTE — Progress Notes (Signed)
Marland Kitchen           [facility]                                                                              Patient Demographics  Willie Andrade, is a 66 y.o. male, DOB - [birth year], ZOX:[id number]  Admit date - 03/05/2017   Admitting Physician Toy Baker, MD  Outpatient Primary MD for the patient is Lucianne Lei, MD  Outpatient specialists:   LOS - 13  days   Medical records reviewed and are as summarized below:    Chief Complaint  Patient presents with  . Abscess  . 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 No worsening drainage from his left toes. In the ED, WBC 24K, high lactate and pro-calcitonin, clinical picture consistent with cellulitis, no osteomyelitis. He was seen by home health RN who felt that his wounds were worse and was advised to come to the ED. Empirically started on IV vancomycin. Vascular surgery and nephrology were consulted.    Assessment & Plan     Recurrent left groin/thigh lymphocele - Status post excision of the lymphocele on 8/8 - Vascular surgery following, operative cultures grew pseudomonas and patient was placed on IV Fortaz - Vascular surgery following, patient underwent debridement of the incision, opening and debridement of proximal thigh incision and removal of staples, debridement of left groin incision again on 8/15 - cultures 8/8 showed Pseudomonas, recent cultures from 8/15 showing pseudomonas again, Currently on IV Fortaz -  Per vascular surgery, continue wet-to-dry dressings to open incisions - Continue pain control - Per ID, Dr. Johnnye Sima continue ceftaz with HD for 3 weeks total     PAD severe with left foot gangrene/purulent drainage from the left leg saphenectomy site - Vascular surgery following closely, left leg bypass is patent - Status post debridement of incision and opening and debridement of proximal thigh incision and removal of staples and debridement of left groin incision  - Severe PAD with left foot gangrene, patient has been declining amputations, has chronic and severe pain    ESRD (end stage renal disease) ([place]) - Nephrology consulted - on hemodialysis TTS    Pacemaker, Chronic systolic heart failure ([place]), 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  - Blood cultures 2 negative. Vancomycin has been discontinued.  - Secondary to lower extremity wound infections, #1 and #2 - Continue Fortaz with hemodialysis    Severe protein-calorie malnutrition ([place]) - Nutrition consulted  Code Status: Full code DVT  Prophylaxis:  Lovenox  Family Communication: Discussed in detail with the patient, all imaging results, lab results explained to the patient   Disposition Plan: Discussed in detail with the patient regarding disposition, he is now agreeable for skilled nursing facility, social work consult placed.   Time Spent in minutes 15 minutes  Procedures:    Consultants:   Nephrology   vascular surgery  Antimicrobials:      Medications  Scheduled Meds: . calcitRIOL  1.25 mcg Oral Q T,Th,Sat-1800  . calcium acetate  2,001 mg Oral TID WC  . cinacalcet  90 mg Oral Q T,Th,Sat-1800  . enoxaparin (LOVENOX) injection  30 mg Subcutaneous Q24H  . feeding supplement  (PRO-STAT SUGAR FREE 64)  30 mL Oral BID  . gabapentin  100 mg Oral BID  . midodrine  10 mg Oral TID WC  . multivitamin  1 tablet Oral QHS  . senna  1 tablet Oral BID  . sodium chloride flush  3 mL Intravenous Q12H   Continuous Infusions: . cefTAZidime (FORTAZ)  IV Stopped (03/18/17 1730)  . [START ON 03/20/2017] cefTAZidime (FORTAZ)  IV     PRN Meds:.acetaminophen **OR** acetaminophen, HYDROmorphone (DILAUDID) injection, ondansetron **OR** ondansetron (ZOFRAN) IV, oxyCODONE-acetaminophen   Antibiotics   Anti-infectives    Start     Dose/Rate Route Frequency Ordered Stop   03/20/17 1800  cefTAZidime (FORTAZ) 2 g in dextrose 5 % 50 mL IVPB     2 g 100 mL/hr over 30 Minutes Intravenous Every T-Th-Sa (1800) 03/19/17 1133 03/31/17 1759   03/16/17 1800  cefTAZidime (FORTAZ) 1 g in dextrose 5 % 50 mL IVPB     1 g 100 mL/hr over 30 Minutes Intravenous Every 24 hours 03/16/17 0411 03/19/17 2359   03/09/17 1830  cefTAZidime (FORTAZ) 1 g in dextrose 5 % 50 mL IVPB  Status:  Discontinued     1 g 100 mL/hr over 30 Minutes Intravenous Every 24 hours 03/09/17 1807 03/16/17 0334   03/07/17 0600  ceFAZolin (ANCEF) IVPB 1 g/50 mL premix  Status:  Discontinued    Comments:  Send with pt to OR   1 g 100 mL/hr over 30 Minutes Intravenous To Surgery 03/06/17 1021 03/07/17 1118   03/06/17 2018  Vancomycin (VANCOCIN) 750-5 MG/150ML-% IVPB    Comments:  Kiang, Angelito   : cabinet override      03/06/17 2018 03/06/17 2130   03/06/17 1200  vancomycin (VANCOCIN) IVPB 750 mg/150 ml premix  Status:  Discontinued     750 mg 150 mL/hr over 60 Minutes Intravenous Every T-Th-Sa (Hemodialysis) 03/06/17 0130 03/09/17 1755   03/05/17 2345  vancomycin (VANCOCIN) 1,500 mg in sodium chloride 0.9 % 500 mL IVPB     1,500 mg 250 mL/hr over 120 Minutes Intravenous  Once 03/05/17 2342 03/06/17 0229        Subjective:   [name] was seen and examined today. No complaints asking about the rehab place. Does  not like the hospital food, irritable. Denies denies dizziness, chest pain, shortness of breath, abdominal pain, N/V/D/C.  Objective:   Vitals:   03/18/17 2051 03/19/17 0533 03/19/17 0754 03/19/17 0938  BP: (!) 117/52 125/69 116/72 120/75  Pulse: 82 87 91 89  Resp: '19 20 18 18  '$ Temp: 98.3 F (36.8 C) 98.3 F (36.8 C) 98.5 F (36.9 C) 98 F (36.7 C)  TempSrc: Oral Oral Oral Oral  SpO2: 100% 100% 93% 98%  Weight:      Height:  Intake/Output Summary (Last 24 hours) at 03/19/17 1246 Last data filed at 03/19/17 1014  Gross per 24 hour  Intake              363 ml  Output                0 ml  Net              363 ml     Wt Readings from Last 3 Encounters:  03/17/17 71.2 kg (156 lb 15.5 oz)  02/27/17 72.6 kg (160 lb)  02/13/17 74.4 kg (164 lb 0.4 oz)     Exam  General: Alert and oriented x 3, NAD Eyes:  HEENT:  Cardiovascular: S1 S2 auscultated, no rubs, murmurs or gallops. Regular rate and rhythm. Respiratory: Clear to auscultation bilaterally, no wheezing, rales or rhonchi Gastrointestinal: Soft, nontender, nondistended, + bowel sounds Ext: dry gangrenous changes of the toes, left thigh dressing intact, no pedal edema Neuro: no new deficits Musculoskeletal: Dry gangrenous changes of the left toes Skin: No rashes Psych: Normal affect and demeanor, alert and oriented x3      Data Reviewed:  I have personally reviewed following labs and imaging studies  Micro Results Recent Results (from the past 240 hour(s))  Anaerobic culture     Status: None (Preliminary result)   Collection Time: 03/14/17 11:32 AM  Result Value Ref Range Status   Specimen Description WOUND LEFT THIGH  Final   Special Requests VEIN HARVEST INCISION POF FORTAZ  Final   Culture   Final    NO ANAEROBES ISOLATED; CULTURE IN PROGRESS FOR 5 DAYS   Report Status PENDING  Incomplete  Aerobic Culture (superficial specimen)     Status: None   Collection Time: 03/14/17 11:32 AM  Result Value  Ref Range Status   Specimen Description WOUND LEFT THIGH  Final   Special Requests VEIN HARVEST INCISION POF FORTAZ  Final   Gram Stain   Final    RARE WBC PRESENT,BOTH PMN AND MONONUCLEAR NO ORGANISMS SEEN    Culture RARE PSEUDOMONAS AERUGINOSA  Final   Report Status 03/17/2017 FINAL  Final   Organism ID, Bacteria PSEUDOMONAS AERUGINOSA  Final      Susceptibility   Pseudomonas aeruginosa - MIC*    CEFTAZIDIME 4 SENSITIVE Sensitive     CIPROFLOXACIN <=0.25 SENSITIVE Sensitive     GENTAMICIN 2 SENSITIVE Sensitive     IMIPENEM 2 SENSITIVE Sensitive     PIP/TAZO 8 SENSITIVE Sensitive     CEFEPIME 2 SENSITIVE Sensitive     * RARE PSEUDOMONAS AERUGINOSA    Radiology Reports Dg Chest 2 View  Result Date: 03/05/2017 CLINICAL DATA:  Bilateral foot wounds, hypotension and elevated lactic acid level. EXAM: CHEST  2 VIEW COMPARISON:  02/11/2017 FINDINGS: Stable moderate cardiac enlargement and radiographic appearance of a biventricular pacing/ICD device. There is no evidence of pulmonary edema, consolidation, pneumothorax, nodule or pleural fluid. The bony thorax is unremarkable. IMPRESSION: Stable cardiomegaly.  No acute findings in the chest. Electronically Signed   By: Aletta Edouard M.D.   On: 03/05/2017 18:20   Dg Ankle Complete Left  Result Date: 03/05/2017 CLINICAL DATA:  Possible infection.  Soft tissue swelling. EXAM: LEFT ANKLE COMPLETE - 3+ VIEW COMPARISON:  None. FINDINGS: The bones are demineralized. There is no acute fracture dislocations involving the left ankle. Dorsal calcaneal enthesophyte is noted. The ankle and subtalar joints are maintained. Vascular calcifications are noted crossing the ankle joint which can be seen with diabetes.  Diffuse soft tissue swelling and thickening is noted which may reflect chronic venous insufficiency, third spacing of fluid or possibly stigmata of a cellulitis. No bone destruction is noted. IMPRESSION: 1. Nonspecific diffuse soft tissue swelling of  the included left leg and ankle possibly representing chronic venous insufficiency, third spacing of fluid or possibly cellulitis. 2. No evidence of osteomyelitis. 3. Osteopenia. Electronically Signed   By: Ashley Royalty M.D.   On: 03/05/2017 20:13   Dg Foot Complete Left  Result Date: 03/05/2017 CLINICAL DATA:  Possible infection EXAM: LEFT FOOT - COMPLETE 3+ VIEW COMPARISON:  None. FINDINGS: Diffuse soft tissue swelling is noted, nonspecific but possibly representing stigmata of cellulitis, third spacing of fluid or possibly from chronic venous insufficiency. Bones are demineralized in appearance without frank bone destruction. Mild degenerative joint space narrowing is seen of the toes. No acute fracture joint dislocations. Dorsal calcaneal enthesophyte is seen. Ankle mortise and subtalar articulations are maintained as are the midfoot joints. IMPRESSION: 1. Osteopenic appearance of the left foot without fracture or frank bone destruction. 2. Soft tissue swelling of the included leg, ankle and foot. Electronically Signed   By: Ashley Royalty M.D.   On: 03/05/2017 20:16   Dg Foot Complete Right  Result Date: 03/05/2017 CLINICAL DATA:  Possible infection. EXAM: RIGHT FOOT COMPLETE - 3+ VIEW COMPARISON:  None. FINDINGS: Diffuse soft tissue swelling of the included leg, ankle and foot especially over the dorsum of the forefoot. No underlying bone destruction or fracture. No joint dislocations. Bones are demineralized in appearance. Dorsal calcaneal enthesophyte is seen. IMPRESSION: 1. Nonspecific soft tissue swelling of the included leg, ankle and foot. Findings could be due to third spacing of fluid, cellulitis or possibly chronic venous insufficiency. 2. No underlying fracture. 3. No evidence of osteomyelitis. Electronically Signed   By: Ashley Royalty M.D.   On: 03/05/2017 20:19    Lab Data:  CBC:  Recent Labs Lab 03/13/17 0840 03/14/17 0505 03/14/17 1832 03/15/17 0815 03/17/17 0943  WBC 11.2* 12.1*  13.1* 12.7* 14.0*  HGB 12.5* 12.2* 11.6* 10.7* 10.7*  HCT 39.6 39.6 38.3* 34.4* 34.5*  MCV 85.7 86.8 88.2 87.3 88.7  PLT 300 223 205 187 157   Basic Metabolic Panel:  Recent Labs Lab 03/13/17 0840 03/14/17 0505 03/14/17 1832 03/15/17 0815 03/17/17 0943  NA 132* 136 138 136 135  K 4.5 4.0 4.4 3.9 4.2  CL 94* 96* 98* 97* 96*  CO2 '22 28 25 25 23  '$ GLUCOSE 112* 105* 110* 106* 122*  BUN 49* 32* 36* 44* 39*  CREATININE 7.37* 6.08* 6.39* 7.22* 6.80*  CALCIUM 8.5* 8.7* 8.5* 8.4* 8.6*  PHOS 5.0*  --  4.5 4.8* 3.9   GFR: Estimated Creatinine Clearance: 10.8 mL/min (A) (by C-G formula based on SCr of 6.8 mg/dL (H)). Liver Function Tests:  Recent Labs Lab 03/13/17 0840 03/14/17 1832 03/15/17 0815 03/17/17 0943  ALBUMIN 2.3* 2.4* 2.4* 2.4*   No results for input(s): LIPASE, AMYLASE in the last 168 hours. No results for input(s): AMMONIA in the last 168 hours. Coagulation Profile:  Recent Labs Lab 03/14/17 0505  INR 1.34   Cardiac Enzymes: No results for input(s): CKTOTAL, CKMB, CKMBINDEX, TROPONINI in the last 168 hours. BNP (last 3 results) No results for input(s): PROBNP in the last 8760 hours. HbA1C: No results for input(s): HGBA1C in the last 72 hours. CBG: No results for input(s): GLUCAP in the last 168 hours. Lipid Profile: No results for input(s): CHOL, HDL, LDLCALC, TRIG, CHOLHDL, LDLDIRECT  in the last 72 hours. Thyroid Function Tests: No results for input(s): TSH, T4TOTAL, FREET4, T3FREE, THYROIDAB in the last 72 hours. Anemia Panel: No results for input(s): VITAMINB12, FOLATE, FERRITIN, TIBC, IRON, RETICCTPCT in the last 72 hours. Urine analysis: No results found for: COLORURINE, APPEARANCEUR, LABSPEC, PHURINE, GLUCOSEU, HGBUR, BILIRUBINUR, KETONESUR, PROTEINUR, UROBILINOGEN, NITRITE, LEUKOCYTESUR   Ripudeep Rai M.D. [facility] 03/19/2017, 12:46 PM  Pager: [phone] Between 7am to 7pm - call Pager - [phone]  After 7pm go to [email or web address] -  password [email or web address]  Call night coverage person covering after 7pm  .

## 2017-03-19 NOTE — Clinical Social Work Note (Signed)
Clinical Social Work Assessment  Patient Details  Name: Willie Andrade MRN: [id number] Date of Birth: 04-Mar-1951  Date of referral:  03/19/17               Reason for consult:  Facility Placement                Permission sought to share information with:  Family Supports Permission granted to share information::  Yes, Verbal Permission Granted  Name::     [employer]  Agency::     Relationship::     Contact Information:   (Patient could not fine sister's phone number in his phone)  Housing/Transportation Living arrangements for the past 2 months:  Single Family Home Source of Information:  Patient Patient Interpreter Needed:  None Criminal Activity/Legal Involvement Pertinent to Current Situation/Hospitalization:  No - Comment as needed Significant Relationships:  Adult Children, Siblings Lives with:  Siblings ([name], sister) Do you feel safe going back to the place where you live?  Yes (Patient is fine going home, but agreed to short-term rehab once explained.) Need for family participation in patient care:  No (Coment)  Care giving concerns:  Patient did not express any concerns regarding getting the care he needs at home. Once ST rehab was explained, he appears willing to go and allowed CSW to explain the process and accepted that skilled facility list that was provided to him.  Social Worker assessment / plan:  CSW talked with patient at the bedside regarding ST rehab consult. Patient did not understand and CSW explained what short-term rehab is and where he would go for the rehab. Mr. Sobecki reported that he lives with his sister and they share the expenses. He gave CSW permission to contact her regarding his discharge plan, but could not find her phone number in his phone. He also reported that his grandson also lives in the home. Mr. Kuhnle reported that his son [name] lives in [place] near the [place] on [address].   CSW a little later visited with  patient regarding working with PT and informed him of the number of times he has declined working with them and stressed the importance of working with PT in the hospital and also the importance of working with therapy in the nursing facility. Mr. Speas indicated that the information I provided him (the number of times he refused to work with PT) was not correct. CSW advised patient that PT would need to evaluate him before leaving the hospital and he expressed understanding.   Employment status:  Retired Database administrator PT Recommendations:  Not assessed at this time (PT has attempted 8 times to work with patient and he declined. They have signed off. CSW talked with nurse to have MD place anaother PT eval order) Information / Referral to community resources:  Skilled Nursing Facility (Patient given list of rehab facilities)  Patient/Family's Response to care: No concerns expressed by patient regarding care during hospitalization.  Patient/Family's Understanding of and Emotional Response to Diagnosis, Current Treatment, and Prognosis:  Not discussed.  Emotional Assessment Appearance:  Appears older than stated age Attitude/Demeanor/Rapport:   Bobette Mo, but talked with CSW) Affect (typically observed):  Calm Orientation:  Oriented to Self, Oriented to Place, Oriented to  Time, Oriented to Situation Alcohol / Substance use:  Tobacco Use, Alcohol Use, Illicit Drugs (Patient smokes and does not reports that she does not drink or use illicit drugs) Psych involvement (Current and /or in the community):  No (Comment)  Discharge Needs  Concerns to be addressed:  Discharge Planning Concerns Readmission within the last 30 days:  Yes Current discharge risk:  None Barriers to Discharge:  No Barriers Identified   Cristobal Goldmann, LCSW 03/19/2017, 5:52 PM

## 2017-03-20 LAB — RENAL FUNCTION PANEL
ALBUMIN: 2.5 g/dL — AB (ref 3.5–5.0)
Anion gap: 16 — ABNORMAL HIGH (ref 5–15)
BUN: 43 mg/dL — AB (ref 6–20)
CHLORIDE: 91 mmol/L — AB (ref 101–111)
CO2: 22 mmol/L (ref 22–32)
CREATININE: 7.23 mg/dL — AB (ref 0.61–1.24)
Calcium: 9.4 mg/dL (ref 8.9–10.3)
GFR calc Af Amer: 8 mL/min — ABNORMAL LOW (ref >60)
GFR, EST NON AFRICAN AMERICAN: 7 mL/min — AB (ref >60)
GLUCOSE: 97 mg/dL (ref 65–99)
PHOSPHORUS: 4.6 mg/dL (ref 2.5–4.6)
POTASSIUM: 5.4 mmol/L — AB (ref 3.5–5.1)
Sodium: 129 mmol/L — ABNORMAL LOW (ref 135–145)

## 2017-03-20 LAB — CBC
HCT: 36.4 % — ABNORMAL LOW (ref 39.0–52.0)
Hemoglobin: 11.5 g/dL — ABNORMAL LOW (ref 13.0–17.0)
MCH: 27.7 pg (ref 26.0–34.0)
MCHC: 31.6 g/dL (ref 30.0–36.0)
MCV: 87.7 fL (ref 78.0–100.0)
PLATELETS: 183 10*3/uL (ref 150–400)
RBC: 4.15 MIL/uL — ABNORMAL LOW (ref 4.22–5.81)
RDW: 20.9 % — AB (ref 11.5–15.5)
WBC: 15.6 10*3/uL — AB (ref 4.0–10.5)

## 2017-03-20 MED ORDER — PENTAFLUOROPROP-TETRAFLUOROETH EX AERO: 1.0000 "application " | INHALATION_SPRAY | CUTANEOUS | Status: DC | PRN

## 2017-03-20 MED ORDER — LIDOCAINE-PRILOCAINE 2.5-2.5 % EX CREA: 1.0000 "application " | TOPICAL_CREAM | CUTANEOUS | Status: DC | PRN

## 2017-03-20 MED ORDER — HEPARIN SODIUM (PORCINE) 1000 UNIT/ML DIALYSIS: 1400.0000 [IU] | Freq: Once | INTRAMUSCULAR | Status: DC

## 2017-03-20 MED ORDER — ALTEPLASE 2 MG IJ SOLR: 2.0000 mg | Freq: Once | INTRAMUSCULAR | Status: DC | PRN

## 2017-03-20 MED ORDER — SODIUM CHLORIDE 0.9 % IV SOLN: 100.0000 mL | INTRAVENOUS | Status: DC | PRN

## 2017-03-20 MED ORDER — NEPRO/CARBSTEADY PO LIQD
237.0000 mL | Freq: Two times a day (BID) | ORAL | Status: DC
  Administered 2017-03-20 – 2017-03-22 (×4): 237 mL via ORAL
  Filled 2017-03-20 (×7): qty 237

## 2017-03-20 MED ORDER — LIDOCAINE HCL (PF) 1 % IJ SOLN: 5.0000 mL | INTRAMUSCULAR | Status: DC | PRN

## 2017-03-20 MED ORDER — OXYCODONE-ACETAMINOPHEN 5-325 MG PO TABS
ORAL_TABLET | ORAL | Status: AC
  Filled 2017-03-20: qty 2

## 2017-03-20 NOTE — Progress Notes (Signed)
Nutrition Follow-up  DOCUMENTATION CODES:   Severe malnutrition in context of chronic illness  INTERVENTION:   -Nepro Shake po BID, each supplement provides 425 kcal and 19 grams protein  -Continue Pro-Stat BID  NUTRITION DIAGNOSIS:   Malnutrition (Severe) related to chronic illness (ESRD on HD, CHF) as evidenced by severe depletion of muscle mass, severe depletion of body fat.  Continues but being addressed via supplements  GOAL:   Patient will meet greater than or equal to 90% of their needs  Progessing  MONITOR:   PO intake, Supplement acceptance, Labs, Weight trends  REASON FOR ASSESSMENT:   Consult Assessment of nutrition requirement/status  ASSESSMENT:    66 yo male admitted with sepsis likely due to chronic wound infection; pt with LLE and R foot cellulitis/dry gangrene of L toes. Pt with ESRD on HD, PVD,gangeren on left foot, CHF  Recorded po intake mostly 0-20% over the last several days  Noted plan to decrease EDW as pt with pedal edema but under dry weight  Labs: sodium 129, potassium 5.4 Meds: phoslo, calcitriol, sensipar, rena-vit  Diet Order:  Diet renal with fluid restriction Fluid restriction: 1200 mL Fluid; Room service appropriate? Yes; Fluid consistency: Thin  Skin:  Wound (see comment) (gangerene on toes, cellulitis)  Last BM:  8/20  Height:   Ht Readings from Last 1 Encounters:  03/14/17 6\' 2"  (1.88 m)    Weight:   Wt Readings from Last 1 Encounters:  03/20/17 153 lb 14.1 oz (69.8 kg)    Ideal Body Weight:     BMI:  Body mass index is 19.76 kg/m.  Estimated Nutritional Needs:   Kcal:  2190-2555 kcals  Protein:  110-128 g  Fluid:  1000 mL plus UOP  EDUCATION NEEDS:   Education needs addressed  Romelle Starcher MS, RD, LDN [phone] Pager  [phone] Weekend/On-Call Pager

## 2017-03-20 NOTE — Progress Notes (Signed)
Pr refused IV team to insert IV. Dr. Isidoro Donning made aware.

## 2017-03-20 NOTE — Progress Notes (Signed)
Marland Kitchen           [facility]                                                                              Patient Demographics  Willie Andrade, is a 66 y.o. male, DOB - 28-Aug-1950, OMV:[id number]  Admit date - 03/05/2017   Admitting Physician Toy Baker, MD  Outpatient Primary MD for the patient is Lucianne Lei, MD  Outpatient specialists:   LOS - 14  days   Medical records reviewed and are as summarized below:    Chief Complaint  Patient presents with  . Abscess  . 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 No worsening drainage from his left toes. In the ED, WBC 24K, high lactate and pro-calcitonin, clinical picture consistent with cellulitis, no osteomyelitis. He was seen by home health RN who felt that his wounds were worse and was advised to come to the ED. Empirically started on IV vancomycin. Vascular surgery and nephrology were consulted.    Assessment & Plan     Recurrent left groin/thigh lymphocele - Status post excision of the lymphocele on 8/8 - Vascular surgery following, patient underwent debridement of the incision, opening and debridement of proximal thigh incision and removal of staples, debridement of left groin incision again on 8/15 - cultures 8/8 showed Pseudomonas, recent cultures from 8/15 showed pseudomonas again, Currently on IV Fortaz - Per vascular surgery, continue wet-to-dry dressings to open incisions - Continue pain control - Per ID,  Dr. Johnnye Sima continue ceftaz with HD for 3 weeks total (stop date 9/1)    PAD severe with left foot gangrene/purulent drainage from the left leg saphenectomy site - Vascular surgery following closely, left leg bypass is patent - Status post debridement of incision and opening and debridement of proximal thigh incision and removal of staples and debridement of left groin incision  - Severe PAD with left foot gangrene, patient has been declining amputations, has chronic pain - Continues to refuse PT, also called psych consult for capacity eval.      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  - Blood cultures 2 negative. Vancomycin has been discontinued.  - Secondary to lower extremity wound infections, #1 and #2 - Continue Fortaz with hemodialysis    Severe protein-calorie malnutrition ([place]) - Nutrition consulted  Code Status: Full code DVT Prophylaxis:  Lovenox  Family Communication: Discussed in detail with the patient, all imaging results, lab results explained to the patient   Disposition Plan: Discussed in detail with the patient regarding disposition, he is now agreeable for skilled nursing facility, social work consult placed.  Continues to refuse PT, also called psych consult for capacity eval.    Time Spent in minutes 25 minutes  Procedures:    Consultants:   Nephrology   vascular surgery  Antimicrobials:      Medications  Scheduled Meds: . calcitRIOL  1.25 mcg Oral Q T,Th,Sat-1800  . calcium acetate  2,001 mg Oral TID WC  . cinacalcet  90 mg Oral Q T,Th,Sat-1800  . enoxaparin (LOVENOX) injection  30 mg  Subcutaneous Q24H  . feeding supplement (PRO-STAT SUGAR FREE 64)  30 mL Oral BID  . gabapentin  100 mg Oral BID  . midodrine  10 mg Oral TID WC  . multivitamin  1 tablet Oral QHS  . oxyCODONE-acetaminophen      . senna  1 tablet Oral BID  . sodium chloride flush  3 mL Intravenous Q12H   Continuous Infusions: . cefTAZidime (FORTAZ)  IV     PRN Meds:.acetaminophen **OR** acetaminophen, HYDROmorphone (DILAUDID) injection, ondansetron **OR** ondansetron (ZOFRAN) IV, oxyCODONE-acetaminophen   Antibiotics   Anti-infectives    Start     Dose/Rate Route Frequency Ordered Stop   03/20/17 1800  cefTAZidime (FORTAZ) 2 g in dextrose 5 % 50 mL IVPB     2 g 100 mL/hr over 30 Minutes Intravenous Every T-Th-Sa (1800) 03/19/17 1133 03/31/17 1759   03/16/17 1800  cefTAZidime (FORTAZ) 1 g in dextrose 5 % 50 mL IVPB     1 g 100 mL/hr over 30 Minutes Intravenous Every 24 hours 03/16/17 0411 03/19/17 2128   03/09/17 1830  cefTAZidime (FORTAZ) 1 g in dextrose 5 % 50 mL IVPB  Status:  Discontinued     1 g 100 mL/hr over 30 Minutes Intravenous Every 24 hours 03/09/17 1807 03/16/17 0334   03/07/17 0600  ceFAZolin (ANCEF) IVPB 1 g/50 mL premix  Status:  Discontinued    Comments:  Send with pt to OR   1 g 100 mL/hr over 30 Minutes Intravenous To Surgery 03/06/17 1021 03/07/17 1118   03/06/17 2018  Vancomycin (VANCOCIN) 750-5 MG/150ML-% IVPB    Comments:  Kiang, Angelito   : cabinet override      03/06/17 2018 03/06/17 2130   03/06/17 1200  vancomycin (VANCOCIN) IVPB 750 mg/150 ml premix  Status:  Discontinued     750 mg 150 mL/hr over 60 Minutes Intravenous Every T-Th-Sa (Hemodialysis) 03/06/17 0130 03/09/17 1755   03/05/17 2345  vancomycin (VANCOCIN) 1,500 mg in sodium chloride 0.9 % 500 mL IVPB     1,500 mg 250 mL/hr over 120 Minutes Intravenous  Once 03/05/17 2342 03/06/17 0229        Subjective:   Willie Andrade was seen and examined today in HD, upset that he has not been seen by social  worker asking about the rehabilitation place. He however refuses to work with physical therapy. Irritable.  Denies dizziness, chest pain, shortness of breath, abdominal pain, N/V/D/C.  Objective:   Vitals:   03/20/17 0945 03/20/17 1000 03/20/17 1030 03/20/17 1108  BP: 95/61 124/72 109/74 105/70  Pulse: 99 85 75 98  Resp:    18  Temp:    97.8 F (36.6 C)  TempSrc:    Oral  SpO2:    98%  Weight:      69.8 kg (153 lb 14.1 oz)  Height:        Intake/Output Summary (Last 24 hours) at 03/20/17 1241 Last data filed at 03/20/17 1108  Gross per 24 hour  Intake              360 ml  Output             2001 ml  Net            -1641 ml     Wt Readings from Last 3 Encounters:  03/20/17 69.8 kg (153 lb 14.1 oz)  02/27/17 72.6 kg (160 lb)  02/13/17 74.4 kg (164 lb 0.4 oz)     Exam   General: Alert and oriented x 3, NAD, irritable   Eyes:  HEENT:    Cardiovascular: S1 S2 auscultated, Regular rate and rhythm. No pedal edema b/l  Respiratory: Clear to auscultation bilaterally, no wheezing, rales or rhonchi  Gastrointestinal: Soft, nontender, nondistended, + bowel sounds  Ext: dry gangrenous changes of the toes, left thigh in dressing intact  Neuro: no new deficits  Musculoskeletal: No digital cyanosis, clubbing  Skin: Gangrenous changes of the bilateral toes, left worse than right  Psych: Normal affect and demeanor, alert and oriented x3    Data Reviewed:  I have personally reviewed following labs and imaging studies  Micro Results Recent Results (from the past 240 hour(s))  Anaerobic culture     Status: None   Collection Time: 03/14/17 11:32 AM  Result Value Ref Range Status   Specimen Description WOUND LEFT THIGH  Final   Special Requests VEIN HARVEST INCISION POF FORTAZ  Final   Culture NO ANAEROBES ISOLATED  Final   Report Status 03/19/2017 FINAL  Final  Aerobic Culture (superficial specimen)     Status: None   Collection Time: 03/14/17 11:32 AM  Result Value  Ref Range Status   Specimen Description WOUND LEFT THIGH  Final   Special Requests VEIN HARVEST INCISION POF FORTAZ  Final   Gram Stain   Final    RARE WBC PRESENT,BOTH PMN AND MONONUCLEAR NO ORGANISMS SEEN    Culture RARE PSEUDOMONAS AERUGINOSA  Final   Report Status 03/17/2017 FINAL  Final   Organism ID, Bacteria PSEUDOMONAS AERUGINOSA  Final      Susceptibility   Pseudomonas aeruginosa - MIC*    CEFTAZIDIME 4 SENSITIVE Sensitive     CIPROFLOXACIN <=0.25 SENSITIVE Sensitive     GENTAMICIN 2 SENSITIVE Sensitive     IMIPENEM 2 SENSITIVE Sensitive     PIP/TAZO 8 SENSITIVE Sensitive     CEFEPIME 2 SENSITIVE Sensitive     * RARE PSEUDOMONAS AERUGINOSA    Radiology Reports Dg Chest 2 View  Result Date: 03/05/2017 CLINICAL DATA:  Bilateral foot wounds, hypotension and elevated lactic acid level. EXAM: CHEST  2 VIEW COMPARISON:  02/11/2017 FINDINGS: Stable moderate cardiac enlargement and radiographic appearance of a biventricular pacing/ICD device. There is no evidence of pulmonary edema, consolidation, pneumothorax, nodule or pleural fluid. The bony thorax is unremarkable. IMPRESSION: Stable cardiomegaly.  No acute findings in the chest. Electronically Signed   By: Glenn  Yamagata M.D.   On: 03/05/2017 18:20   Dg Ankle Complete Left  Result Date: 03/05/2017 CLINICAL DATA:  Possible infection.  Soft tissue swelling. EXAM: LEFT ANKLE COMPLETE - 3+ VIEW COMPARISON:  None. FINDINGS: The bones are demineralized. There is no acute fracture dislocations involving the left ankle. Dorsal calcaneal enthesophyte is noted. The ankle and subtalar joints are maintained. Vascular calcifications   are noted crossing the ankle joint which can be seen with diabetes. Diffuse soft tissue swelling and thickening is noted which may reflect chronic venous insufficiency, third spacing of fluid or possibly stigmata of a cellulitis. No bone destruction is noted. IMPRESSION: 1. Nonspecific diffuse soft tissue swelling of  the included left leg and ankle possibly representing chronic venous insufficiency, third spacing of fluid or possibly cellulitis. 2. No evidence of osteomyelitis. 3. Osteopenia. Electronically Signed   By: David  Kwon M.D.   On: 03/05/2017 20:13   Dg Foot Complete Left  Result Date: 03/05/2017 CLINICAL DATA:  Possible infection EXAM: LEFT FOOT - COMPLETE 3+ VIEW COMPARISON:  None. FINDINGS: Diffuse soft tissue swelling is noted, nonspecific but possibly representing stigmata of cellulitis, third spacing of fluid or possibly from chronic venous insufficiency. Bones are demineralized in appearance without frank bone destruction. Mild degenerative joint space narrowing is seen of the toes. No acute fracture joint dislocations. Dorsal calcaneal enthesophyte is seen. Ankle mortise and subtalar articulations are maintained as are the midfoot joints. IMPRESSION: 1. Osteopenic appearance of the left foot without fracture or frank bone destruction. 2. Soft tissue swelling of the included leg, ankle and foot. Electronically Signed   By: David  Kwon M.D.   On: 03/05/2017 20:16   Dg Foot Complete Right  Result Date: 03/05/2017 CLINICAL DATA:  Possible infection. EXAM: RIGHT FOOT COMPLETE - 3+ VIEW COMPARISON:  None. FINDINGS: Diffuse soft tissue swelling of the included leg, ankle and foot especially over the dorsum of the forefoot. No underlying bone destruction or fracture. No joint dislocations. Bones are demineralized in appearance. Dorsal calcaneal enthesophyte is seen. IMPRESSION: 1. Nonspecific soft tissue swelling of the included leg, ankle and foot. Findings could be due to third spacing of fluid, cellulitis or possibly chronic venous insufficiency. 2. No underlying fracture. 3. No evidence of osteomyelitis. Electronically Signed   By: David  Kwon M.D.   On: 03/05/2017 20:19    Lab Data:  CBC:  Recent Labs Lab 03/14/17 0505 03/14/17 1832 03/15/17 0815 03/17/17 0943 03/20/17 0724  WBC 12.1* 13.1*  12.7* 14.0* 15.6*  HGB 12.2* 11.6* 10.7* 10.7* 11.5*  HCT 39.6 38.3* 34.4* 34.5* 36.4*  MCV 86.8 88.2 87.3 88.7 87.7  PLT 223 205 187 187 183   Basic Metabolic Panel:  Recent Labs Lab 03/14/17 0505 03/14/17 1832 03/15/17 0815 03/17/17 0943 03/20/17 0725  NA 136 138 136 135 129*  K 4.0 4.4 3.9 4.2 5.4*  CL 96* 98* 97* 96* 91*  CO2 28 25 25 23 22  GLUCOSE 105* 110* 106* 122* 97  BUN 32* 36* 44* 39* 43*  CREATININE 6.08* 6.39* 7.22* 6.80* 7.23*  CALCIUM 8.7* 8.5* 8.4* 8.6* 9.4  PHOS  --  4.5 4.8* 3.9 4.6   GFR: Estimated Creatinine Clearance: 9.9 mL/min (A) (by C-G formula based on SCr of 7.23 mg/dL (H)). Liver Function Tests:  Recent Labs Lab 03/14/17 1832 03/15/17 0815 03/17/17 0943 03/20/17 0725  ALBUMIN 2.4* 2.4* 2.4* 2.5*   No results for input(s): LIPASE, AMYLASE in the last 168 hours. No results for input(s): AMMONIA in the last 168 hours. Coagulation Profile:  Recent Labs Lab 03/14/17 0505  INR 1.34   Cardiac Enzymes: No results for input(s): CKTOTAL, CKMB, CKMBINDEX, TROPONINI in the last 168 hours. BNP (last 3 results) No results for input(s): PROBNP in the last 8760 hours. HbA1C: No results for input(s): HGBA1C in the last 72 hours. CBG: No results for input(s): GLUCAP in the last 168 hours.   Lipid Profile: No results for input(s): CHOL, HDL, LDLCALC, TRIG, CHOLHDL, LDLDIRECT in the last 72 hours. Thyroid Function Tests: No results for input(s): TSH, T4TOTAL, FREET4, T3FREE, THYROIDAB in the last 72 hours. Anemia Panel: No results for input(s): VITAMINB12, FOLATE, FERRITIN, TIBC, IRON, RETICCTPCT in the last 72 hours. Urine analysis: No results found for: COLORURINE, APPEARANCEUR, LABSPEC, PHURINE, GLUCOSEU, HGBUR, BILIRUBINUR, KETONESUR, PROTEINUR, UROBILINOGEN, NITRITE, LEUKOCYTESUR   Kurt Hoffmeier M.D. [facility] 03/20/2017, 12:41 PM  Pager: [phone] Between 7am to 7pm - call Pager - [phone]  After 7pm go to [email or web address] -  password [email or web address]  Call night coverage person covering after 7pm  .

## 2017-03-20 NOTE — Progress Notes (Signed)
[facility] Progress Note   Subjective: continues to refuse PT.  Plan is for SNF.  Also per primary needs pscyh eval for competency.   Objective Vitals:   03/20/17 0945 03/20/17 1000 03/20/17 1030 03/20/17 1108  BP: 95/61 124/72 109/74 105/70  Pulse: 99 85 75 98  Resp:    18  Temp:    97.8 F (36.6 C)  TempSrc:    Oral  SpO2:    98%  Weight:    69.8 kg (153 lb 14.1 oz)  Height:       Physical Exam General: chronically ill appearing male in NAD Heart: S1,S2, 2/6 systolic M Lungs: CTAB A/P Abdomen: Active BS. L groin incision with drsg CDI.  Extremities: woody appearance LE. Drsgs L leg CDI. Has pedal edema L > R.  Dialysis Access: LFA AVF + bruit   Additional Objective Labs: Basic Metabolic Panel:  Recent Labs Lab 03/15/17 0815 03/17/17 0943 03/20/17 0725  NA 136 135 129*  K 3.9 4.2 5.4*  CL 97* 96* 91*  CO2 25 23 22   GLUCOSE 106* 122* 97  BUN 44* 39* 43*  CREATININE 7.22* 6.80* 7.23*  CALCIUM 8.4* 8.6* 9.4  PHOS 4.8* 3.9 4.6   Liver Function Tests:  Recent Labs Lab 03/15/17 0815 03/17/17 0943 03/20/17 0725  ALBUMIN 2.4* 2.4* 2.5*   No results for input(s): LIPASE, AMYLASE in the last 168 hours. CBC:  Recent Labs Lab 03/14/17 0505 03/14/17 1832 03/15/17 0815 03/17/17 0943 03/20/17 0724  WBC 12.1* 13.1* 12.7* 14.0* 15.6*  HGB 12.2* 11.6* 10.7* 10.7* 11.5*  HCT 39.6 38.3* 34.4* 34.5* 36.4*  MCV 86.8 88.2 87.3 88.7 87.7  PLT 223 205 187 187 183   Blood Culture    Component Value Date/Time   SDES WOUND LEFT THIGH 03/14/2017 1132   SDES WOUND LEFT THIGH 03/14/2017 1132   SPECREQUEST VEIN HARVEST INCISION POF FORTAZ 03/14/2017 1132   SPECREQUEST VEIN HARVEST INCISION POF FORTAZ 03/14/2017 1132   CULT NO ANAEROBES ISOLATED 03/14/2017 1132   CULT RARE PSEUDOMONAS AERUGINOSA 03/14/2017 1132   REPTSTATUS 03/19/2017 FINAL 03/14/2017 1132   REPTSTATUS 03/17/2017 FINAL 03/14/2017 1132    Cardiac Enzymes: No results for input(s):  CKTOTAL, CKMB, CKMBINDEX, TROPONINI in the last 168 hours. CBG: No results for input(s): GLUCAP in the last 168 hours. Iron Studies: No results for input(s): IRON, TIBC, TRANSFERRIN, FERRITIN in the last 72 hours. @lablastinr3 @ Studies/Results: No results found. Medications: . cefTAZidime (FORTAZ)  IV     . calcitRIOL  1.25 mcg Oral Q T,Th,Sat-1800  . calcium acetate  2,001 mg Oral TID WC  . cinacalcet  90 mg Oral Q T,Th,Sat-1800  . enoxaparin (LOVENOX) injection  30 mg Subcutaneous Q24H  . feeding supplement (PRO-STAT SUGAR FREE 64)  30 mL Oral BID  . gabapentin  100 mg Oral BID  . midodrine  10 mg Oral TID WC  . multivitamin  1 tablet Oral QHS  . oxyCODONE-acetaminophen      . senna  1 tablet Oral BID  . sodium chloride flush  3 mL Intravenous Q12H     Dialysis Orders:  TTS at [facility] 3:45hr, BFR 400, DFR 800, EDW 72kg, 2K/2Ca bath, AVF, Heparin 1400 bolus - Calcitriol 1.49mcg PO q HD - Venofer 100mg  x 10 ordered (4 given so far) for tsat 20% on 7/26. - Was getting sensipar 180mg  PO q HD (held on 7/28 d/t low Ca)  Assessment/Plan: 1. Sepsis / infected wounds (L groin and L thigh):  growing P aeruginosa. Now on IV Fortaz per ID to be completed on Sept 1st (= 3 wks Rx).  2. PAD/L foot gangrene: VVS following. Patient declining amputations. Stable for DC from vascular prospective 3. Recurrent L groin lymphocele: S/p excision lymphocele on 8/8; sp incision debridement of L groin 4. 4. ESRD -T,Th,S. Next HD tomorrow. K+ 4.2 5. Anemia - HGB 10.7  Follow HGB  6. Secondary hyperparathyroidism -  Cont binders/VDRA 7. HTN/volume - BP controlled, on soft side. Cont midodrine.  HD 03/17/17 Net UF 1.0 liter post wt 71.4 kg. Still has pedal edema. Lower EDW on DC.  8.  Nutrition - Renal diet/vitamins  9.  Debility - for SNFP 10.  Psych - for compentency eval due to refusing treatments   Vinson Moselle MD [facility] pgr [phone]   03/20/2017, 1:35  PM

## 2017-03-20 NOTE — Progress Notes (Signed)
PT Cancellation Note  Patient Details Name: Willie Andrade MRN: [id number] DOB: Dec 08, 1950   Cancelled Treatment:    Reason Eval/Treat Not Completed: Patient at procedure or test/unavailable   Currently in HD;  Will make my best effort to work with Willie Andrade after HD in coordination with pain meds; I've noted that SW has emphasized to Willie Andrade the need to work with PT (Thank you!);   Dr. Isidoro Donning: will you please emphasize the need to work with PT as well?  Will follow up later today as time allows;  Otherwise, will follow up for PT tomorrow;   Thank you,  Van Clines, PT  Acute Rehabilitation Services Pager [phone] Office [phone]     Willie Andrade 03/20/2017, 8:05 AM

## 2017-03-20 NOTE — Progress Notes (Addendum)
PT Cancellation Note  Patient Details Name: Willie Andrade MRN: [id number] DOB: 04-23-1951   Cancelled Treatment:    Reason Eval/Treat Not Completed: Other (comment)    Attempted PT eval within an hour of pt receiving pain meds in HD;   Declining PT eval, stating he just returned from HD and that his feet hurt too much;   I explained that I timed PT eval to be within an hour of pain meds, and that now is likely the best time to work with respect to pain medication effectiveness;   Continued to decline;   We briefly discussed plan for SNF for rehab (pt brought it up), and I told him I agree that SNF for rehab will likely be good for him and that he MUST participate in therapies both here acutely and at SNF;   Will reattempt tomorrow;  If pt refuses PT consistently, will DC PT services per departmental policy.  Discussed with Case Mgr; worth considering: does the pt have capacity?  Thank you,  Van Clines, PT  Acute Rehabilitation Services Pager [phone] Office [phone]    Levi Aland 03/20/2017, 12:02 PM

## 2017-03-21 DIAGNOSIS — I739 Peripheral vascular disease, unspecified: Secondary | ICD-10-CM

## 2017-03-21 DIAGNOSIS — F17210 Nicotine dependence, cigarettes, uncomplicated: Secondary | ICD-10-CM

## 2017-03-21 DIAGNOSIS — I132 Hypertensive heart and chronic kidney disease with heart failure and with stage 5 chronic kidney disease, or end stage renal disease: Secondary | ICD-10-CM

## 2017-03-21 DIAGNOSIS — I998 Other disorder of circulatory system: Secondary | ICD-10-CM

## 2017-03-21 DIAGNOSIS — N186 End stage renal disease: Secondary | ICD-10-CM

## 2017-03-21 DIAGNOSIS — L089 Local infection of the skin and subcutaneous tissue, unspecified: Secondary | ICD-10-CM

## 2017-03-21 DIAGNOSIS — T148XXA Other injury of unspecified body region, initial encounter: Secondary | ICD-10-CM

## 2017-03-21 DIAGNOSIS — I5022 Chronic systolic (congestive) heart failure: Secondary | ICD-10-CM

## 2017-03-21 DIAGNOSIS — F4320 Adjustment disorder, unspecified: Secondary | ICD-10-CM

## 2017-03-21 DIAGNOSIS — I96 Gangrene, not elsewhere classified: Secondary | ICD-10-CM

## 2017-03-21 DIAGNOSIS — L03116 Cellulitis of left lower limb: Secondary | ICD-10-CM

## 2017-03-21 DIAGNOSIS — E43 Unspecified severe protein-calorie malnutrition: Secondary | ICD-10-CM

## 2017-03-21 MED ORDER — KIDNEY FAILURE BOOK
Freq: Once | Status: AC
  Administered 2017-03-21: 18:00:00
  Filled 2017-03-21: qty 1

## 2017-03-21 NOTE — Evaluation (Signed)
Physical Therapy Evaluation Patient Details Name: Willie Andrade MRN: [id number] DOB: [birth year] Today's Date: 03/21/2017   History of Present Illness  Willie Mitchellis a 66 y.o.malewith medical history significant of ESRD (HD MWF) , peripheral vascular disease, gangrene of left foot, left groin lymphocele, chronic systolic CHF s/p AICD, hypotension on midodrine, narcotic abuse, anemia; Presented to ED with worsening wounds, s/p I&D L groin wound 8/15  Clinical Impression   Pt admitted with above diagnosis, and s/p above surgery. Pt currently with functional limitations due to the deficits listed below (see PT Problem List). Walking independently without assistive device prior to last admission; Currently heavily dependent on RW for balance; He has had difficulty participating with PT acutely; At SNF for post-acute rehab, they will be able to have a more consistent schedule for PT and OT, which will work in Mr. Willie Andrade's favor;  Pt will benefit from skilled PT to increase their independence and safety with mobility to allow discharge to the venue listed below.       Follow Up Recommendations SNF    Equipment Recommendations  3in1 (PT)    Recommendations for Other Services       Precautions / Restrictions Precautions Precautions: Fall      Mobility  Bed Mobility Overal bed mobility: Modified Independent             General bed mobility comments: slow moving, but no assist needed  Transfers Overall transfer level: Needs assistance Equipment used: Rolling walker (2 wheeled) Transfers: Sit to/from Stand Sit to Stand: Min guard         General transfer comment: Dependent on UE push/support  Ambulation/Gait Ambulation/Gait assistance: Min guard   Assistive device: Rolling walker (2 wheeled) Gait Pattern/deviations: Decreased step length - right;Decreased step length - left;Decreased stance time - right;Decreased stance time - left;Decreased stride length;Trunk  flexed Gait velocity: slow Gait velocity interpretation: Below normal speed for age/gender General Gait Details: Walked hallways with RW and minguard assist for safety; Tends to keep RW far in front of him despite cues for RW proximity for better support, closer RW will help him unweigh painful feet while walking; heavy dependence on UE support for balance  Stairs            Wheelchair Mobility    Modified Rankin (Stroke Patients Only)       Balance Overall balance assessment: History of Falls (per pt)                                           Pertinent Vitals/Pain Pain Assessment: Faces Faces Pain Scale: Hurts little more Pain Location: Bil feet; declined to give pain severity rating when asked Pain Descriptors / Indicators: Discomfort Pain Intervention(s): Premedicated before session;Monitored during session    Home Living Family/patient expects to be discharged to:: Skilled nursing facility Living Arrangements: Other relatives               Additional Comments: Lives with sister and grandson    Prior Function Level of Independence: Independent         Comments: takes SCAT to HD     Hand Dominance        Extremity/Trunk Assessment   Upper Extremity Assessment Upper Extremity Assessment: Overall WFL for tasks assessed (for simple mobility tasks)    Lower Extremity Assessment Lower Extremity Assessment: Generalized weakness       Communication  Communication: No difficulties  Cognition Arousal/Alertness: Awake/alert Behavior During Therapy: Restless Overall Cognitive Status: Difficult to assess                                 General Comments: It is worth trying to assess memory more; PT (myself included) has made multiple attempts to work with Mr. Prosch, and he at one point told the SW these were lies -- I question memory deficits?      General Comments General comments (skin integrity, edema, etc.):  Helpful to be present during administration of pain meds to get better participation    Exercises     Assessment/Plan    PT Assessment Patient needs continued PT services  PT Problem List Decreased strength;Decreased range of motion;Decreased activity tolerance;Decreased balance;Decreased mobility;Decreased coordination;Decreased cognition;Decreased knowledge of use of DME;Decreased safety awareness;Decreased knowledge of precautions;Pain       PT Treatment Interventions DME instruction;Gait training;Stair training;Functional mobility training;Therapeutic activities;Therapeutic exercise;Balance training;Cognitive remediation;Patient/family education    PT Goals (Current goals can be found in the Care Plan section)  Acute Rehab PT Goals Patient Stated Goal: Wants to walk normally PT Goal Formulation: Patient unable to participate in goal setting Time For Goal Achievement: 04/04/17 Potential to Achieve Goals: Good    Frequency Min 2X/week   Barriers to discharge        Co-evaluation               AM-PAC PT "6 Clicks" Daily Activity  Outcome Measure Difficulty turning over in bed (including adjusting bedclothes, sheets and blankets)?: None Difficulty moving from lying on back to sitting on the side of the bed? : None Difficulty sitting down on and standing up from a chair with arms (e.g., wheelchair, bedside commode, etc,.)?: A Little Help needed moving to and from a bed to chair (including a wheelchair)?: A Little Help needed walking in hospital room?: A Little Help needed climbing 3-5 steps with a railing? : A Lot 6 Click Score: 19    End of Session Equipment Utilized During Treatment: Gait belt;Other (comment) (pt's own belt in his jeans) Activity Tolerance: Patient tolerated treatment well Patient left: in bed;with call bell/phone within reach Nurse Communication: Mobility status PT Visit Diagnosis: Unsteadiness on feet (R26.81);Other abnormalities of gait and  mobility (R26.89);Pain Pain - Right/Left:  (Bilateral) Pain - part of body:  (Feet)    Time: 1610-9604 PT Time Calculation (min) (ACUTE ONLY): 16 min   Charges:   PT Evaluation $PT Eval Moderate Complexity: 1 Mod     PT G Codes:        Van Clines, PT  Acute Rehabilitation Services Pager [phone] Office [phone]   Levi Aland 03/21/2017, 9:43 AM

## 2017-03-21 NOTE — Consult Note (Signed)
[facility] Consult   Reason for Consult:  Capacity evaluation Referring Physician:  Dr. Rockne Menghini Patient Identification: Willie Andrade MRN:  [id number] Principal Diagnosis: <principal problem not specified> Diagnosis:   Patient Active Problem List   Diagnosis Date Noted  . ESRD (end stage renal disease) on dialysis ([place]) [N18.6, Z99.2] 03/16/2017  . PVD (peripheral vascular disease) ([place]) [I73.9] 03/05/2017  . Wound infection [T14.8XXA, L08.9] 03/05/2017  . Narcotic overdose [T40.601A] 02/11/2017  . Sepsis ([place]) [A41.9] 02/11/2017  . Severe protein-calorie malnutrition ([place]) [E43] 02/11/2017  . General weakness [R53.1] 01/26/2017  . Wound dehiscence [T81.30XA] 01/26/2017  . Hypotension [I95.9]   . Cellulitis of left foot [L03.116] 01/17/2017  . Cellulitis [L03.90] 01/17/2017  . Gangrene of toe of left foot ([place]) [I96]   . Pre-syncope [R55] 12/21/2016  . Chronic systolic heart failure ([place]) [I50.22] 10/11/2016  . 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 Pacemaker [Z95.0] 10/10/2016  . ESRD (end stage renal disease) ([place]) [N18.6] 02/14/2016    Total Time spent with patient: 1 hour  Subjective:   Willie Andrade is a 66 y.o. male patient admitted with gangrene left foot toes.  HPI:  Willie Andrade is a 66 y.o. male, seen, chart reviewed for this face-to-face psychiatry consultation and evaluation of capacity evaluation for making his own medical decisions. Patient is awake, alert, oriented to his first name, last name, date of birth and no he has been in hospital for his gangrene of left foot toes. Patient is able to tell me today's day of the week, date, month, year but looking at calendar and his mobile phone. Patient reportedly staying with his sister for the last 6 months before that used to live in [place], [place] and [place]. Patient has a multiple family members including  children and grandchildren. Patient refused to participate in questions related to concentration saying that he would prefer to have a and a discussion sometime later without giving any specifics. Patient stated that he has to checked with his cardiologist about his pacemaker and thanked for reminding him when asking about health problems. Staff RN reported that patient has been refusing some of the treatments including amputation which was discussed with him by his surgeons.  Past Psychiatric History: Patient has no history of mental illness.  Risk to Self: Is patient at risk for suicide?: No Risk to Others:   Prior Inpatient Therapy:   Prior Outpatient Therapy:    Past Medical History:  Past Medical History:  Diagnosis Date  . Chronic systolic CHF (congestive heart failure), NYHA class 2 ([place])   . ESRD (end stage renal disease) on dialysis ([place])   . Hepatitis B   . HTN (hypertension)   . Hyperlipidemia   . 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 Peripheral vascular disease ([place])   . Protein-calorie malnutrition, severe ([place])   . Tobacco abuse     Past Surgical History:  Procedure Laterality Date  . ABDOMINAL AORTOGRAM W/LOWER EXTREMITY N/A 01/22/2017   Procedure: Abdominal Aortogram w/Lower Extremity;  Surgeon: Rosetta Posner, MD;  Location: [facility];  Service: Cardiovascular;  Laterality: N/A;  . BIV ICD GENERATOR CHANGEOUT N/A 11/28/2016   Procedure: BiV ICD [employer]; St Jude Surgeon: Will Meredith Leeds, MD;  Location: [facility];  Service: Cardiovascular;  Laterality: N/A;  . BYPASS GRAFT FEMORAL-PERONEAL Left 01/24/2017   Procedure: Left Leg  FEMORAL-PERONEAL Bypass Graft;  Surgeon: Rosetta Posner, MD;  Location: [facility] OR;  Service: Vascular;  Laterality: Left;  . DRAINAGE AND CLOSURE OF LYMPHOCELE Left 03/07/2017   Procedure: EXCISION AND CLOSURE OF LEFT GROIN LYMPHOCELE;  Surgeon: Conrad Lake Lafayette, MD;   Location: [facility] OR;  Service: Vascular;  Laterality: Left;  . I&D EXTREMITY Left 03/14/2017   Procedure: IRRIGATION AND DEBRIDEMENT LEFT GROIN AND THIGH INCISIONS and LOWER LEG INCISION;  Surgeon: Rosetta Posner, MD;  Location: [facility] OR;  Service: Vascular;  Laterality: Left;   Family History:  Family History  Problem Relation Age of Onset  . Diabetes Mother   . Gout Mother   . Stroke Father   . Gout Maternal Grandmother   . Gout Sister   . Asthma Brother    Family Psychiatric  History: unknown. Social History:  History  Alcohol Use No     History  Drug Use No    Social History   Social History  . Marital status: Legally Separated    Spouse name: N/A  . Number of children: N/A  . Years of education: N/A   Social History Main Topics  . Smoking status: Current Every Day Smoker    Packs/day: 1.00    Years: 40.00    Types: Cigarettes  . Smokeless tobacco: Never Used  . Alcohol use No  . Drug use: No  . Sexual activity: Not Asked   Other Topics Concern  . None   Social History Narrative  . None   Additional Social History:    Allergies:   Allergies  Allergen Reactions  . No Known Allergies     Labs:  Results for orders placed or performed during the hospital encounter of 03/05/17 (from the past 48 hour(s))  CBC     Status: Abnormal   Collection Time: 03/20/17  7:24 AM  Result Value Ref Range   WBC 15.6 (H) 4.0 - 10.5 K/uL   RBC 4.15 (L) 4.22 - 5.81 MIL/uL   Hemoglobin 11.5 (L) 13.0 - 17.0 g/dL   HCT 36.4 (L) 39.0 - 52.0 %   MCV 87.7 78.0 - 100.0 fL   MCH 27.7 26.0 - 34.0 pg   MCHC 31.6 30.0 - 36.0 g/dL   RDW 20.9 (H) 11.5 - 15.5 %   Platelets 183 150 - 400 K/uL  Renal function panel     Status: Abnormal   Collection Time: 03/20/17  7:25 AM  Result Value Ref Range   Sodium 129 (L) 135 - 145 mmol/L   Potassium 5.4 (H) 3.5 - 5.1 mmol/L   Chloride 91 (L) 101 - 111 mmol/L   CO2 22 22 - 32 mmol/L   Glucose, Bld 97 65 - 99 mg/dL   BUN 43 (H) 6 - 20 mg/dL    Creatinine, Ser 7.23 (H) 0.61 - 1.24 mg/dL   Calcium 9.4 8.9 - 10.3 mg/dL   Phosphorus 4.6 2.5 - 4.6 mg/dL   Albumin 2.5 (L) 3.5 - 5.0 g/dL   GFR calc non Af Amer 7 (L) >60 mL/min   GFR calc Af Amer 8 (L) >60 mL/min    Comment: (NOTE) The eGFR has been calculated using the CKD EPI equation. This calculation has not been validated in all clinical situations. eGFR's persistently <60 mL/min signify possible Chronic Kidney Disease.    Anion gap 16 (H) 5 - 15    Current Facility-Administered Medications  Medication Dose Route Frequency Provider Last Rate Last Dose  . acetaminophen (TYLENOL) tablet 650  mg  650 mg Oral Q6H PRN Rai, Ripudeep K, MD   650 mg at 03/17/17 1034   Or  . acetaminophen (TYLENOL) suppository 650 mg  650 mg Rectal Q6H PRN Rai, Ripudeep K, MD      . calcitRIOL (ROCALTROL) capsule 1.25 mcg  1.25 mcg Oral Q T,Th,Sat-1800 Rai, Ripudeep K, MD   1.25 mcg at 03/20/17 1706  . calcium acetate (PHOSLO) capsule 2,001 mg  2,001 mg Oral TID WC Rai, Ripudeep K, MD   2,001 mg at 03/21/17 0856  . cefTAZidime (FORTAZ) 2 g in dextrose 5 % 50 mL IVPB  2 g Intravenous Q T,Th,Sat-1800 Bajbus, Lauren D, RPH   Stopped at 03/20/17 1737  . cinacalcet (SENSIPAR) tablet 90 mg  90 mg Oral Q T,Th,Sat-1800 Rai, Ripudeep K, MD   90 mg at 03/20/17 1706  . enoxaparin (LOVENOX) injection 30 mg  30 mg Subcutaneous Q24H Rai, Ripudeep K, MD   30 mg at 03/19/17 1011  . feeding supplement (NEPRO CARB STEADY) liquid 237 mL  237 mL Oral BID BM Rai, Ripudeep K, MD   237 mL at 03/21/17 1000  . feeding supplement (PRO-STAT SUGAR FREE 64) liquid 30 mL  30 mL Oral BID Nita Sells, MD   30 mL at 03/21/17 0857  . gabapentin (NEURONTIN) capsule 100 mg  100 mg Oral BID Rai, Ripudeep K, MD   100 mg at 03/21/17 0856  . HYDROmorphone (DILAUDID) injection 1 mg  1 mg Intravenous Q4H PRN Rai, Ripudeep K, MD   1 mg at 03/20/17 2232  . midodrine (PROAMATINE) tablet 10 mg  10 mg Oral TID WC Rai, Ripudeep K, MD   10 mg at  03/21/17 0852  . multivitamin (RENA-VIT) tablet 1 tablet  1 tablet Oral QHS Rai, Ripudeep K, MD   1 tablet at 03/20/17 2216  . ondansetron (ZOFRAN) tablet 4 mg  4 mg Oral Q6H PRN Rai, Ripudeep K, MD       Or  . ondansetron (ZOFRAN) injection 4 mg  4 mg Intravenous Q6H PRN Rai, Ripudeep K, MD      . oxyCODONE-acetaminophen (PERCOCET/ROXICET) 5-325 MG per tablet 1-2 tablet  1-2 tablet Oral Q6H PRN Rai, Vernelle Emerald, MD   2 tablet at 03/21/17 0853  . senna (SENOKOT) tablet 8.6 mg  1 tablet Oral BID Rai, Ripudeep K, MD   8.6 mg at 03/21/17 0856  . sodium chloride flush (NS) 0.9 % injection 3 mL  3 mL Intravenous Q12H Rai, Ripudeep K, MD   3 mL at 03/21/17 0857    Musculoskeletal: Strength & Muscle Tone: within normal limits Gait & Station: unable to stand Patient leans: N/A  Psychiatric Specialty Exam: Physical Exam as per history and physical   ROS  No Fever-chills, No Headache, No changes with Vision or hearing, reports vertigo No problems swallowing food or Liquids, No Chest pain, Cough or Shortness of Breath, No Abdominal pain, No Nausea or Vommitting, Bowel movements are regular, No Blood in stool or Urine, No dysuria, No new skin rashes or bruises, No new joints pains-aches,  No new weakness, tingling, numbness in any extremity, No recent weight gain or loss, No polyuria, polydypsia or polyphagia,  A full 10 point Review of Systems was done, except as stated above, all other Review of Systems were negative.  Blood pressure 110/89, pulse 80, temperature 97.9 F (36.6 C), temperature source Oral, resp. rate 18, height '6\' 2"'$  (1.88 m), weight 71.7 kg (158 lb), SpO2 99 %.Body mass index  is 20.29 kg/m.  General Appearance: Casual  Eye Contact:  Good  Speech:  Clear and Coherent  Volume:  Normal  Mood:  Anxious  Affect:  Appropriate and Congruent  Thought Process:  Coherent and Goal Directed  Orientation:  Full (Time, Place, and Person)  Thought Content:  WDL  Suicidal Thoughts:   No  Homicidal Thoughts:  No  Memory:  Immediate;   Good Recent;   Fair Remote;   Fair  Judgement:  Impaired  Insight:  Fair  Psychomotor Activity:  Normal  Concentration:  Concentration: Fair and Attention Span: Fair  Recall:  Good  Fund of Knowledge:  Good  Language:  Good  Akathisia:  Negative  Handed:  Right  AIMS (if indicated):     Assets:  Communication Skills Desire for Improvement Financial Resources/Insurance Housing Leisure Time Resilience Transportation  ADL's:  Intact  Cognition:  WNL  Sleep:        Treatment Plan Summary: This is a 66 years old male with end-stage renal disease, chronic systolic heart failure with the pacemaker, severe protein calorie malnutrition, peripheral vascular disease presented with gangrene of toe off left foot. Reportedly patient discussed with the treatment options including amputation and then refused.  Based on my evaluation patient has fine understanding about his health problems and health needs and meet criteria for capacity to make his own medical decisions. Patient seems to be making poor medical decision by refusing amputation.   Patient also cooperative initially first 20 minutes and then become noncooperative when asked questions about concentration.  Appreciate psychiatric consultation and we sign off as of today Please contact [phone] or [phone] if needs further assistance   Disposition: Supportive therapy provided about ongoing stressors.  Ambrose Finland, MD 03/21/2017 1:26 PM

## 2017-03-21 NOTE — Progress Notes (Signed)
Patient ID: Willie Andrade, male   DOB: 06-27-1951, 66 y.o.   MRN: [id number] Left groin incision healed with no further drainage. 3+ femoral to peroneal bypass. No change in grafting in his toes with dry gangrene toes on the left foot. Okay for discharge to skilled nursing facility from my standpoint. Will follow-up in the office

## 2017-03-21 NOTE — Clinical Social Work Note (Addendum)
CSW talked with patient on Tuesday, 8/21 and 8/22 regarding SNF placement for short-term rehab. CSW explained to patient why the needed short-term rehab and how CSW would assist in finding a place for him to go on 8/21 and skilled facility list given to him. On 8/22 Willie Andrade was provided with facility responses and he indicated that he would be going to his sister's house.   CSW talked with patient's sister and confirmed that he has a walker at home but does not use it. Patient informed of conversation with sister and confirmed that he does not always use his walker but knows that he should use it to lessen the likelihood of falls. CSW will advise MD of patient's discharge plan and f/u with patient on Thursday.  Genelle Bal, MSW, LCSW Licensed Clinical Social Worker Clinical Social Work Department [employer] [phone]

## 2017-03-21 NOTE — Progress Notes (Signed)
[facility] Progress Note   Subjective: Upset because he says he doesn't want to go to SNF unless he can see facility first. Says he can go home and get help from his sister. C/O bilateral feet pain but did agree to have PT eval, has been walking in hallway.   Objective Vitals:   03/20/17 1722 03/20/17 2207 03/21/17 0314 03/21/17 0939  BP: 110/84 103/69 111/69 110/89  Pulse: 97 78 77 80  Resp: 18 18 18 18   Temp: 98.2 F (36.8 C) (!) 97.4 F (36.3 C) 97.8 F (36.6 C) 97.9 F (36.6 C)  TempSrc: Oral Oral Oral Oral  SpO2: 97% 100% 99% 99%  Weight:  71.7 kg (158 lb)    Height:  6\' 2"  (1.88 m)     Physical Exam General: chronically ill appearing male NAD Heart: RRR 2/6 systolic M Lungs: CTAB A/P Abdomen: Active BS Extremities: has bipedal edema. Woody appearance LE. Drsg L groin, L thigh needs changed.  Dialysis Access: LFA AVF + bruit   Additional Objective Labs: Basic Metabolic Panel:  Recent Labs Lab 03/15/17 0815 03/17/17 0943 03/20/17 0725  NA 136 135 129*  K 3.9 4.2 5.4*  CL 97* 96* 91*  CO2 25 23 22   GLUCOSE 106* 122* 97  BUN 44* 39* 43*  CREATININE 7.22* 6.80* 7.23*  CALCIUM 8.4* 8.6* 9.4  PHOS 4.8* 3.9 4.6   Liver Function Tests:  Recent Labs Lab 03/15/17 0815 03/17/17 0943 03/20/17 0725  ALBUMIN 2.4* 2.4* 2.5*   No results for input(s): LIPASE, AMYLASE in the last 168 hours. CBC:  Recent Labs Lab 03/14/17 1832 03/15/17 0815 03/17/17 0943 03/20/17 0724  WBC 13.1* 12.7* 14.0* 15.6*  HGB 11.6* 10.7* 10.7* 11.5*  HCT 38.3* 34.4* 34.5* 36.4*  MCV 88.2 87.3 88.7 87.7  PLT 205 187 187 183   Blood Culture    Component Value Date/Time   SDES WOUND LEFT THIGH 03/14/2017 1132   SDES WOUND LEFT THIGH 03/14/2017 1132   SPECREQUEST VEIN HARVEST INCISION POF FORTAZ 03/14/2017 1132   SPECREQUEST VEIN HARVEST INCISION POF FORTAZ 03/14/2017 1132   CULT NO ANAEROBES ISOLATED 03/14/2017 1132   CULT RARE PSEUDOMONAS AERUGINOSA  03/14/2017 1132   REPTSTATUS 03/19/2017 FINAL 03/14/2017 1132   REPTSTATUS 03/17/2017 FINAL 03/14/2017 1132    Cardiac Enzymes: No results for input(s): CKTOTAL, CKMB, CKMBINDEX, TROPONINI in the last 168 hours. CBG: No results for input(s): GLUCAP in the last 168 hours. Iron Studies: No results for input(s): IRON, TIBC, TRANSFERRIN, FERRITIN in the last 72 hours. @lablastinr3 @ Studies/Results: No results found. Medications: . cefTAZidime (FORTAZ)  IV Stopped (03/20/17 1737)   . calcitRIOL  1.25 mcg Oral Q T,Th,Sat-1800  . calcium acetate  2,001 mg Oral TID WC  . cinacalcet  90 mg Oral Q T,Th,Sat-1800  . enoxaparin (LOVENOX) injection  30 mg Subcutaneous Q24H  . feeding supplement (NEPRO CARB STEADY)  237 mL Oral BID BM  . feeding supplement (PRO-STAT SUGAR FREE 64)  30 mL Oral BID  . gabapentin  100 mg Oral BID  . midodrine  10 mg Oral TID WC  . multivitamin  1 tablet Oral QHS  . senna  1 tablet Oral BID  . sodium chloride flush  3 mL Intravenous Q12H     Dialysis Orders:  TTS at [facility] 3:45hr, BFR 400, DFR 800, EDW 72kg, 2K/2Ca bath, AVF, Heparin 1400 bolus - Calcitriol 1.68mcg PO q HD - Venofer 100mg  x 10 ordered (4 given so far) for tsat  20% on 7/26. - Was getting sensipar 180mg  PO q HD (held on 7/28 d/t low Ca)  Assessment/Plan: 1. Sepsis / infected wounds (L groin and L thigh): growing P aeruginosa. Now on IV Fortaz per ID to be completed on Sept 1st (= 3 wks Rx).  2. PAD/L foot gangrene: VVS following. Patient declining amputations. Stable for DC from vascular prospective 3. Recurrent L groin lymphocele: S/p excision lymphocele on 8/8; sp incision debridement of L groin 4. 4. ESRD -T,Th,S.Unsure of disposition so will enter orders for tomorrow. K+ 5.4 2.0 K bath.  5. Anemia - HGB 11.5 No ESA needed.Follow HGB  6. Secondary hyperparathyroidism - Cont binders/VDRA 7. HTN/volume - BP controlled, on soft side. Cont midodrine.  HD 03/20/17 Pre wt 72.2 kg   Net UF 2.0 liters  Post wt 69.8 kg. Lower EDW to 69.5 kg on DC.  8.  Nutrition - Renal diet/vitamins  9.  Debility - for SNF, however now refusing unless he can see facility first.  10.  Psych - for compentency eval due to refusing treatments  Rita H. Brown NP-C 03/21/2017, 10:53 AM  [facility] [phone]  Pt seen, examined and agree w A/P as above.  Vinson Moselle MD [employer] pager [phone]   03/21/2017, 3:10 PM

## 2017-03-21 NOTE — Progress Notes (Signed)
Progress Note    Willie Andrade  ONG:[id number] DOB: September 20, 1950  DOA: 03/05/2017 PCP: Lucianne Lei, MD    Brief Narrative:   Chief complaint: Follow-up lymphocele, PVD with gangrene  Medical records reviewed and are as summarized below:  Willie Andrade is an 65 y.o. 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 Upon initial evaluation, WBC was 24,000 with high lactate and elevated pro-calcitonin.  Assessment/Plan:   Principal problems:  Sepsis ([place]) Met sepsis criteria on admission, was empirically started on IV vancomycin/Fortaz. Blood cultures negative and vancomycin ultimately discontinued. Source of sepsis thought to be from his chronic lower extremity wounds. He remains on [place] with HD treatments. Sepsis physiology has resolved.  Recurrent left groin/thigh lymphocele Underwent excision/drainage of lymphocele on 03/07/17 by vascular surgery. Taken back to surgery 03/14/17 where he underwent debridement of the incision, opening and debridement of proximal thigh incision and removal of staples as well as debridement of the left groin incision. Cultures from 03/07/17 grew Pseudomonas, and repeat cultures from 03/14/17 also grew Pseudomonas. He continues on IV Fortaz for this. Wound management is with wet-to-dry dressings to open incisional areas. Patient continues to report significant pain. ID has evaluated the patient and recommends Tressie Ellis with HD for a total course of 3 weeks with a stop date of 03/31/17.  Active problems:  PAD severe with  left foot gangrene/purulent drainage from the left leg saphenectomy site Left leg bypass is patent per vascular surgery. Patient does have left foot dry gangrene but declines amputation. He has been refusing physical therapy and disposition planning to SNF.   ESRD (end stage renal disease) ([place]) Continue HD per nephrology.  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 Volume management per HD.  Severe protein-calorie malnutrition (HCC) Body mass index is 20.29 kg/m. Dietitian consultation performed.  Behavioral issues Patient is described as "difficult" and exhibits an angry affect with oppositional behaviors. Refuses disposition planning. Psychiatric evaluation subsequently requested. Has capacity for medical decision making.  Family Communication/Anticipated D/C date and plan/Code Status   DVT prophylaxis: Lovenox ordered. Code Status: Full Code.  Family Communication: No family currently at the bedside. Disposition Plan: Unclear, refuses SNF.   Medical Consultants:    Nephrology  Vascular Surgery  Psychiatry   Anti-Infectives:    None  Subjective:   Very angry and oppositional, refusing to answer questions clearly, cut me off and advanced into what I can do for him. Has a lot of lower extremity pain. Says that he has not been given any pain occasions to treat it.  Objective:    Vitals:   03/20/17 1722 03/20/17 2207 03/21/17 0314 03/21/17 0939  BP: 110/84 103/69 111/69 110/89  Pulse: 97 78 77 80  Resp: _0 Temp: 98.2 F (36.8 C) (!) 97.4 F (36.3 C) 97.8 F (36.6 C) 97.9 F (36.6 C)  TempSrc: Oral Oral Oral Oral  SpO2: 97% 100% 99% 99%  Weight:  71.7 kg (158 lb)  Height:  _0  (1.88 m)      Intake/Output Summary (Last 24 hours) at 03/21/17 1727 Last data filed at 03/21/17  1449  Gross per 24 hour  Intake             1250 ml  Output                0 ml  Net             1250 ml   Filed Weights   03/20/17 0715 03/20/17 1108 03/20/17 2207  Weight: 72.2 kg (159 lb 2.8 oz) 69.8 kg (153 lb 14.1 oz) 71.7 kg (158 lb)    Exam: General: No acute distress. Cardiovascular: Heart sounds show a regular rate, and rhythm. No gallops or rubs. No murmurs. No JVD. Lungs: Clear to auscultation bilaterally with good air movement. No rales, rhonchi or wheezes. Abdomen: Soft, nontender, nondistended with normal active bowel sounds. No masses. No hepatosplenomegaly. Neurological: Alert and oriented 3. Moves all extremities 4 with equal strength. Cranial nerves II through XII grossly intact. Skin: Left groin incision healed. Extremities: Dry gangrene to toes of left foot and foot darker in color than the right. Psychiatric: Mood and affect are angry/irritable. Insight and judgment are poor.   Data Reviewed:   I have personally reviewed following labs and imaging studies:  Labs: Labs show the following: No labs resulted for today. Was noted to be hyponatremic and hyperkalemic on labs yesterday. Will order labs for the morning.  Microbiology: 03/14/17: Left thigh wound: Pseudomonas 03/07/17: Left groin wound: Pseudomonas. 03/07/17: MRSA PCR screen negative.  Procedures and diagnostic studies: 03/05/17: Chest x-ray: Stable cardiomegaly. 03/05/17: Left ankle films: Nonspecific diffuse soft tissue swelling of the included left leg and ankle. No osteomyelitis. 03/05/17: Left foot films: Osteopenia. No fracture or frank bone destruction. Soft tissue swelling. 03/05/17: Right foot films: Nonspecific soft tissue swelling. No underlying fracture. No osteomyelitis.  Medications:   . calcitRIOL  1.25 mcg Oral Q T,Th,Sat-1800  . calcium acetate  2,001 mg Oral TID WC  . cinacalcet  90 mg Oral Q T,Th,Sat-1800  . enoxaparin (LOVENOX) injection  30 mg Subcutaneous Q24H  . feeding supplement  (NEPRO CARB STEADY)  237 mL Oral BID BM  . feeding supplement (PRO-STAT SUGAR FREE 64)  30 mL Oral BID  . gabapentin  100 mg Oral BID  . midodrine  10 mg Oral TID WC  . multivitamin  1 tablet Oral QHS  . senna  1 tablet Oral BID  . sodium chloride flush  3 mL Intravenous Q12H   Continuous Infusions: . cefTAZidime (FORTAZ)  IV Stopped (03/20/17 1737)   Level II visit   LOS: 15 days   Benny Deutschman  [facility] Pager [phone]. If unable to reach me by pager, please call my cell phone at [phone].  *Please refer to [email or web address], password [email or web address] to get updated schedule on who will round on this patient, as hospitalists switch teams weekly. If 7PM-7AM, please contact night-coverage at [email or web address], password [email or web address] for any overnight needs.  03/21/2017, 5:27 PM

## 2017-03-22 DIAGNOSIS — Z992 Dependence on renal dialysis: Secondary | ICD-10-CM

## 2017-03-22 DIAGNOSIS — Z950 Presence of cardiac pacemaker: Secondary | ICD-10-CM

## 2017-03-22 DIAGNOSIS — A419 Sepsis, unspecified organism: Principal | ICD-10-CM

## 2017-03-22 LAB — RENAL FUNCTION PANEL
Albumin: 2.4 g/dL — ABNORMAL LOW (ref 3.5–5.0)
Anion gap: 14 (ref 5–15)
BUN: 43 mg/dL — ABNORMAL HIGH (ref 6–20)
CO2: 27 mmol/L (ref 22–32)
Calcium: 9.6 mg/dL (ref 8.9–10.3)
Chloride: 94 mmol/L — ABNORMAL LOW (ref 101–111)
Creatinine, Ser: 7.32 mg/dL — ABNORMAL HIGH (ref 0.61–1.24)
GFR calc Af Amer: 8 mL/min — ABNORMAL LOW (ref >60)
GFR calc non Af Amer: 7 mL/min — ABNORMAL LOW (ref >60)
Glucose, Bld: 110 mg/dL — ABNORMAL HIGH (ref 65–99)
Phosphorus: 4.2 mg/dL (ref 2.5–4.6)
Potassium: 4.3 mmol/L (ref 3.5–5.1)
Sodium: 135 mmol/L (ref 135–145)

## 2017-03-22 LAB — CBC
HCT: 36.2 % — ABNORMAL LOW (ref 39.0–52.0)
Hemoglobin: 10.9 g/dL — ABNORMAL LOW (ref 13.0–17.0)
MCH: 26.9 pg (ref 26.0–34.0)
MCHC: 30.1 g/dL (ref 30.0–36.0)
MCV: 89.4 fL (ref 78.0–100.0)
Platelets: 195 K/uL (ref 150–400)
RBC: 4.05 MIL/uL — ABNORMAL LOW (ref 4.22–5.81)
RDW: 22.2 % — ABNORMAL HIGH (ref 11.5–15.5)
WBC: 11.9 10*3/uL — ABNORMAL HIGH (ref 4.0–10.5)

## 2017-03-22 MED ORDER — CINACALCET HCL 30 MG PO TABS: 90.0000 mg | ORAL_TABLET | ORAL | 0 refills | Status: AC

## 2017-03-22 MED ORDER — OXYCODONE-ACETAMINOPHEN 5-325 MG PO TABS
ORAL_TABLET | ORAL | Status: AC
  Administered 2017-03-22: 2 via ORAL
  Filled 2017-03-22: qty 2

## 2017-03-22 MED ORDER — CALCIUM ACETATE (PHOS BINDER) 667 MG PO CAPS: 2001.0000 mg | ORAL_CAPSULE | Freq: Three times a day (TID) | ORAL | 0 refills | Status: AC

## 2017-03-22 MED ORDER — MIDODRINE HCL 5 MG PO TABS
ORAL_TABLET | ORAL | Status: AC
  Administered 2017-03-22: 10 mg via ORAL
  Filled 2017-03-22: qty 2

## 2017-03-22 MED ORDER — PENTAFLUOROPROP-TETRAFLUOROETH EX AERO: 1.0000 "application " | INHALATION_SPRAY | CUTANEOUS | Status: DC | PRN

## 2017-03-22 MED ORDER — SODIUM CHLORIDE 0.9 % IV SOLN: 100.0000 mL | INTRAVENOUS | Status: DC | PRN

## 2017-03-22 MED ORDER — HEPARIN SODIUM (PORCINE) 1000 UNIT/ML DIALYSIS: 20.0000 [IU]/kg | INTRAMUSCULAR | Status: DC | PRN

## 2017-03-22 MED ORDER — HEPARIN SODIUM (PORCINE) 1000 UNIT/ML DIALYSIS: 1000.0000 [IU] | INTRAMUSCULAR | Status: DC | PRN

## 2017-03-22 MED ORDER — CALCITRIOL 0.5 MCG PO CAPS
ORAL_CAPSULE | ORAL | Status: AC
  Filled 2017-03-22: qty 2

## 2017-03-22 MED ORDER — ALTEPLASE 2 MG IJ SOLR: 2.0000 mg | Freq: Once | INTRAMUSCULAR | Status: DC | PRN

## 2017-03-22 MED ORDER — ACETAMINOPHEN 325 MG PO TABS
ORAL_TABLET | ORAL | Status: AC
  Filled 2017-03-22: qty 2

## 2017-03-22 MED ORDER — LIDOCAINE HCL (PF) 1 % IJ SOLN: 5.0000 mL | INTRAMUSCULAR | Status: DC | PRN

## 2017-03-22 MED ORDER — CALCITRIOL 0.25 MCG PO CAPS
ORAL_CAPSULE | ORAL | Status: AC
Start: 2017-03-22 — End: 2017-03-22
  Filled 2017-03-22: qty 1

## 2017-03-22 MED ORDER — LIDOCAINE-PRILOCAINE 2.5-2.5 % EX CREA: 1.0000 "application " | TOPICAL_CREAM | CUTANEOUS | Status: DC | PRN

## 2017-03-22 MED ORDER — GABAPENTIN 100 MG PO CAPS: 100.0000 mg | ORAL_CAPSULE | Freq: Two times a day (BID) | ORAL | 0 refills | Status: AC

## 2017-03-22 MED ORDER — CALCITRIOL 0.25 MCG PO CAPS: 1.2500 ug | ORAL_CAPSULE | ORAL | 0 refills | Status: AC

## 2017-03-22 MED ORDER — NEPRO/CARBSTEADY PO LIQD: 237.0000 mL | Freq: Two times a day (BID) | ORAL | 6 refills | Status: AC

## 2017-03-22 MED ORDER — DEXTROSE 5 % IV SOLN: 2.0000 g | INTRAVENOUS | Status: AC

## 2017-03-22 NOTE — Clinical Social Work Note (Signed)
Patient medically stable for discharge home (after dialysis) and cab voucher provided to nurse. Voucher versus bus pass provided as patient is a fall risk. CSW signing off as Willie Andrade is discharging home this evening after his dialysis treatment.  Genelle Bal, MSW, LCSW Licensed Clinical Social Worker Clinical Social Work Department [employer] [phone]

## 2017-03-22 NOTE — Progress Notes (Signed)
Patient notably guards in his possession a bag of various outpatient medications, including multiple bottles of oxycodone. He was leary about my checking the bag, and is not willing to turn in his medications.

## 2017-03-22 NOTE — Progress Notes (Signed)
Pt being discharged home via cab voucher. Pt alert and oriented x4. VSS. Pt c/o no pain at this time. No signs of respiratory distress. Education complete and care plans resolved. IV removed with catheter intact and pt tolerated well. No further issues at this time. Pt to follow up with PCP. Neaveh Belanger R, RN 

## 2017-03-22 NOTE — Discharge Summary (Signed)
Physician Discharge Summary  Willie Andrade GGY:[id number] DOB: 03-Aug-1950 DOA: 03/05/2017  PCP: Lucianne Lei, MD  Admit date: 03/05/2017 Discharge date: 03/22/2017  Admitted From: Home Discharge disposition: Home   Recommendations for Outpatient Follow-Up:   1. Refused SNF placement. Found to have capacity to make medical decisions on his own behalf per psychiatrist.   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  Diet recommendation: Low sodium, heart healthy.    Wound care: Dry 4 x 4 gauze to left groin with hypafix tape to wick moisture.  Moist 2 x 2 gauze with NS to left distal thigh medial calf open incisions. Place dry gauze on top. Hypafix tape on top.    History of Present Illness:   Willie Andrade is an 66 y.o. 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 Upon initial evaluation, WBC was 24,000 with high lactate and elevated pro-calcitonin.   Hospital Course by Problem:   Principal problems:  Sepsis ([place]) Met sepsis criteria on admission, was empirically started on IV vancomycin/Fortaz. Blood cultures negative and  vancomycin ultimately discontinued. Source of sepsis thought to be from his chronic lower extremity wounds. He remains on [place] with HD treatments. Sepsis physiology has resolved.  Recurrent left groin/thigh lymphocele Underwent excision/drainage of lymphocele on 03/07/17 by vascular surgery. Taken back to surgery 03/14/17 where he underwent debridement of the incision, opening and debridement of proximal thigh incision and removal of staples as well as debridement of the left groin incision. Cultures from 03/07/17 grew Pseudomonas, and repeat cultures from 03/14/17 also grew Pseudomonas. He continues on IV Fortaz for this. Wound management is with wet-to-dry dressings to open incisional areas. Patient continues to report significant pain. ID has evaluated the patient and recommends Tressie Ellis with HD for a total course of 3 weeks with a stop date of 03/31/17.  Active problems:  PAD severe with left foot gangrene/purulent drainage from the left leg saphenectomy site Left leg bypass is patent per vascular surgery. Patient does have left foot dry gangrene but declines amputation. He has been refusing physical therapy and disposition planning to SNF.  ESRD (end stage renal disease) ([place]) Continue HD per nephrology.  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 Volume management per HD.  Severe protein-calorie malnutrition (HCC) Body mass index is 20.27 kg/m. Dietitian consultation performed.  Behavioral issues Patient is described as "difficult" and exhibits an angry affect with oppositional behaviors. Refuses disposition planning. Psychiatric evaluation subsequently requested. Has capacity for medical decision making.   Medical Consultants:    Nephrology  Vascular Surgery  Psychiatry  Discharge Exam:   Vitals:    03/22/17 1733 03/22/17 1828  BP: (!) 110/59 120/78  Pulse: 88 97  Resp:  18  Temp:  98 F (36.7 C)  SpO2:  97%   Vitals:   03/22/17 1700 03/22/17 1730 03/22/17 1733 03/22/17 1828  BP: 115/74 110/74 (!) 110/59 120/78  Pulse: 79 79 88 97  Resp:  12  18  Temp:  98 F (36.7 C)  98 F (36.7 C)  TempSrc:    Oral  SpO2:    97%  Weight:  69.5 kg (153 lb 3.5 oz)    Height:        General exam: Appears angry and irritable. Respiratory system: Clear to auscultation. Respiratory effort normal. Cardiovascular system: S1 & S2 heard, RRR. No JVD,  rubs, gallops or clicks. No murmurs. Gastrointestinal system: Abdomen is nondistended, soft and nontender. No organomegaly or masses felt. Normal bowel sounds heard. Central nervous system: Alert and oriented. No focal neurological deficits. Extremities: Dry gangrene to toes of left foot and foot dark in color. Skin: No rashes, lesions or ulcers. Psychiatry: Judgement and insight appear normal. Mood & affect appropriate.    The results of significant diagnostics from this hospitalization (including imaging, microbiology, ancillary and laboratory) are listed below for reference.     Procedures and Diagnostic Studies:   Dg Chest 2 View  Result Date: 03/05/2017 CLINICAL DATA:  Bilateral foot wounds, hypotension and elevated lactic acid level. EXAM: CHEST  2 VIEW COMPARISON:  02/11/2017 FINDINGS: Stable moderate cardiac enlargement and radiographic appearance of a biventricular pacing/ICD device. There is no evidence of pulmonary edema, consolidation, pneumothorax, nodule or pleural fluid. The bony thorax is unremarkable. IMPRESSION: Stable cardiomegaly.  No acute findings in the chest. Electronically Signed   By: Aletta Edouard M.D.   On: 03/05/2017 18:20   Dg Ankle Complete Left  Result Date: 03/05/2017 CLINICAL DATA:  Possible infection.  Soft tissue swelling. EXAM: LEFT ANKLE COMPLETE - 3+ VIEW COMPARISON:  None. FINDINGS: The bones are  demineralized. There is no acute fracture dislocations involving the left ankle. Dorsal calcaneal enthesophyte is noted. The ankle and subtalar joints are maintained. Vascular calcifications are noted crossing the ankle joint which can be seen with diabetes. Diffuse soft tissue swelling and thickening is noted which may reflect chronic venous insufficiency, third spacing of fluid or possibly stigmata of a cellulitis. No bone destruction is noted. IMPRESSION: 1. Nonspecific diffuse soft tissue swelling of the included left leg and ankle possibly representing chronic venous insufficiency, third spacing of fluid or possibly cellulitis. 2. No evidence of osteomyelitis. 3. Osteopenia. Electronically Signed   By: Ashley Royalty M.D.   On: 03/05/2017 20:13   Dg Foot Complete Left  Result Date: 03/05/2017 CLINICAL DATA:  Possible infection EXAM: LEFT FOOT - COMPLETE 3+ VIEW COMPARISON:  None. FINDINGS: Diffuse soft tissue swelling is noted, nonspecific but possibly representing stigmata of cellulitis, third spacing of fluid or possibly from chronic venous insufficiency. Bones are demineralized in appearance without frank bone destruction. Mild degenerative joint space narrowing is seen of the toes. No acute fracture joint dislocations. Dorsal calcaneal enthesophyte is seen. Ankle mortise and subtalar articulations are maintained as are the midfoot joints. IMPRESSION: 1. Osteopenic appearance of the left foot without fracture or frank bone destruction. 2. Soft tissue swelling of the included leg, ankle and foot. Electronically Signed   By: Ashley Royalty M.D.   On: 03/05/2017 20:16   Dg Foot Complete Right  Result Date: 03/05/2017 CLINICAL  DATA:  Possible infection. EXAM: RIGHT FOOT COMPLETE - 3+ VIEW COMPARISON:  None. FINDINGS: Diffuse soft tissue swelling of the included leg, ankle and foot especially over the dorsum of the forefoot. No underlying bone destruction or fracture. No joint dislocations. Bones are  demineralized in appearance. Dorsal calcaneal enthesophyte is seen. IMPRESSION: 1. Nonspecific soft tissue swelling of the included leg, ankle and foot. Findings could be due to third spacing of fluid, cellulitis or possibly chronic venous insufficiency. 2. No underlying fracture. 3. No evidence of osteomyelitis. Electronically Signed   By: Ashley Royalty M.D.   On: 03/05/2017 20:19     Labs:   Basic Metabolic Panel:  Recent Labs Lab 03/17/17 0943 03/20/17 0725 03/22/17 1350  NA 135 129* 135  K 4.2 5.4* 4.3  CL 96* 91* 94*  CO2 _0 GLUCOSE 122* 97 110*  BUN 39* 43* 43*  CREATININE 6.80* 7.23* 7.32*  CALCIUM 8.6* 9.4 9.6  PHOS 3.9 4.6 4.2   GFR Estimated Creatinine Clearance: 9.8 mL/min (A) (by C-G formula based on SCr of 7.32 mg/dL (H)). Liver Function Tests:  Recent Labs Lab 03/17/17 0943 03/20/17 0725 03/22/17 1350  ALBUMIN 2.4* 2.5* 2.4*   No results for input(s): LIPASE, AMYLASE in the last 168 hours. No results for input(s): AMMONIA in the last 168 hours. Coagulation profile No results for input(s): INR, PROTIME in the last 168 hours.  CBC:  Recent Labs Lab 03/17/17 0943 03/20/17 0724 03/22/17 1351  WBC 14.0* 15.6* 11.9*  HGB 10.7* 11.5* 10.9*  HCT 34.5* 36.4* 36.2*  MCV 88.7 87.7 89.4  PLT 187 183 195   Cardiac Enzymes: No results for input(s): CKTOTAL, CKMB, CKMBINDEX, TROPONINI in the last 168 hours. BNP: Invalid input(s): POCBNP CBG: No results for input(s): GLUCAP in the last 168 hours. D-Dimer No results for input(s): DDIMER in the last 72 hours. Hgb A1c No results for input(s): HGBA1C in the last 72 hours. Lipid Profile No results for input(s): CHOL, HDL, LDLCALC, TRIG, CHOLHDL, LDLDIRECT in the last 72 hours. Thyroid function studies No results for input(s): TSH, T4TOTAL, T3FREE, THYROIDAB in the last 72 hours.  Invalid input(s): FREET3 Anemia work up No results for input(s): VITAMINB12, FOLATE, FERRITIN, TIBC, IRON, RETICCTPCT in  the last 72 hours. Microbiology Recent Results (from the past 240 hour(s))  Anaerobic culture     Status: None   Collection Time: 03/14/17 11:32 AM  Result Value Ref Range Status   Specimen Description WOUND LEFT THIGH  Final   Special Requests VEIN HARVEST INCISION POF FORTAZ  Final   Culture NO ANAEROBES ISOLATED  Final   Report Status 03/19/2017 FINAL  Final  Aerobic Culture (superficial specimen)     Status: None   Collection Time: 03/14/17 11:32 AM  Result Value Ref Range Status   Specimen Description WOUND LEFT THIGH  Final   Special Requests VEIN HARVEST INCISION POF FORTAZ  Final   Gram Stain   Final    RARE WBC PRESENT,BOTH PMN AND MONONUCLEAR NO ORGANISMS SEEN    Culture RARE PSEUDOMONAS AERUGINOSA  Final   Report Status 03/17/2017 FINAL  Final   Organism ID, Bacteria PSEUDOMONAS AERUGINOSA  Final      Susceptibility   Pseudomonas aeruginosa - MIC*    CEFTAZIDIME 4 SENSITIVE Sensitive     CIPROFLOXACIN <=0.25 SENSITIVE Sensitive     GENTAMICIN 2 SENSITIVE Sensitive     IMIPENEM 2 SENSITIVE Sensitive     PIP/TAZO 8 SENSITIVE Sensitive  CEFEPIME 2 SENSITIVE Sensitive     * RARE PSEUDOMONAS AERUGINOSA     Discharge Instructions:   Discharge Instructions    Call MD for:  extreme fatigue    Complete by:  As directed    Call MD for:  persistant dizziness or light-headedness    Complete by:  As directed    Call MD for:  persistant nausea and vomiting    Complete by:  As directed    Call MD for:  severe uncontrolled pain    Complete by:  As directed    Call MD for:  temperature >100.4    Complete by:  As directed    Diet - low sodium heart healthy    Complete by:  As directed    Increase activity slowly    Complete by:  As directed      Allergies as of 03/22/2017      Reactions   No Known Allergies       Medication List    TAKE these medications   calcitRIOL 0.25 MCG capsule Commonly known as:  ROCALTROL Take 5 capsules (1.25 mcg total) by mouth  every Tuesday, Thursday, and Saturday at 6 PM.   calcium acetate 667 MG capsule Commonly known as:  PHOSLO Take 3 capsules (2,001 mg total) by mouth 3 (three) times daily with meals.   cefTAZidime 2 g in dextrose 5 % 50 mL Inject 2 g into the vein every Tuesday, Thursday, and Saturday at 6 PM.   cinacalcet 30 MG tablet Commonly known as:  SENSIPAR Take 3 tablets (90 mg total) by mouth every Tuesday, Thursday, and Saturday at 6 PM.   feeding supplement (NEPRO CARB STEADY) Liqd Take 237 mLs by mouth 2 (two) times daily between meals.   gabapentin 100 MG capsule Commonly known as:  NEURONTIN Take 1 capsule (100 mg total) by mouth 2 (two) times daily.   midodrine 5 MG tablet Commonly known as:  PROAMATINE Take 2 tablets (10 mg total) by mouth 3 (three) times daily with meals.   oxyCODONE-acetaminophen 5-325 MG tablet Commonly known as:  PERCOCET/ROXICET Take 1-2 tablets by mouth every 6 (six) hours as needed for severe pain.   polyethylene glycol packet Commonly known as:  MIRALAX / GLYCOLAX Take 17 g by mouth daily as needed for mild constipation.            Discharge Care Instructions        Start     Ordered   03/22/17 0000  gabapentin (NEURONTIN) 100 MG capsule  2 times daily     03/22/17 1050   03/22/17 0000  Nutritional Supplements (FEEDING SUPPLEMENT, NEPRO CARB STEADY,) LIQD  2 times daily between meals     03/22/17 1050   03/22/17 0000  calcium acetate (PHOSLO) 667 MG capsule  3 times daily with meals     03/22/17 1050   03/22/17 0000  cinacalcet (SENSIPAR) 30 MG tablet  Every T-Th-Sa (1800)     03/22/17 1050   03/22/17 0000  calcitRIOL (ROCALTROL) 0.25 MCG capsule  Every T-Th-Sa (1800)     03/22/17 1050   03/22/17 0000  cefTAZidime 2 g in dextrose 5 % 50 mL  Every T-Th-Sa (1800)     03/22/17 1050   03/22/17 0000  Increase activity slowly     03/22/17 1050   03/22/17 0000  Diet - low sodium heart healthy     03/22/17 1050   03/22/17 0000  Call MD for:   persistant nausea  and vomiting     03/22/17 1050   03/22/17 0000  Call MD for:  severe uncontrolled pain     03/22/17 1050   03/22/17 0000  Call MD for:  persistant dizziness or light-headedness     03/22/17 1050   03/22/17 0000  Call MD for:  extreme fatigue     03/22/17 1050   03/22/17 0000  Call MD for:  temperature >100.4     03/22/17 1050     Follow-up Information    Early, Arvilla Meres, MD Follow up in 3 week(s).   Specialties:  Vascular Surgery, Cardiology Why:  Our office will call you to arrange an appointment  Contact information: [place] [address] [phone]        [facility] Follow up.   Why:  For HD per your usual schedule. Contact information: [place] [address] [phone]            Time coordinating discharge: 35 minutes.  Signed:  Kalley Nicholl  Pager [phone] [facility] 03/22/2017, 10:02 PM

## 2017-03-22 NOTE — Progress Notes (Signed)
Patient arrived to unit per bed.  Reviewed treatment plan and this RN agrees.  Report received from bedside RN, Herbert Seta.  Consent verified.  Patient A & o X 4, forgetful at times. Lung sounds diminished to ausculation in all fields. BLE 1+ edema. Cardiac: NSR.  Prepped LLAVF with alcohol and cannulated with two 15 gauge needles.  Pulsation of blood noted.  Flushed access well with saline per protocol.  Connected and secured lines and initiated tx at 1345.  UF goal of 1100 mL and net fluid removal of 600 mL.  Will continue to monitor.

## 2017-03-22 NOTE — Care Management Note (Signed)
Case Management Note  Patient Details  Name: Willie Andrade MRN: [id number] Date of Birth: January 18, 1951  Subjective/Objective:       CM following for progression and d/c planning.              Action/Plan: 03/22/2017 Met with pt who is active with [facility] , this CM entered orders to resume HHRN and HHPT services. [facility] notified for plan to resume services per pt wishes.   Expected Discharge Date:     03/22/2017             Expected Discharge Plan:  [place]  In-House Referral:  NA  Discharge planning Services  CM Consult  Post Acute Care Choice:  Home Health Choice offered to:  Patient  DME Arranged:  N/A DME Agency:  NA  HH Arranged:  RN, PT [place] Agency:  [facility]  Status of Service:  Completed, signed off  If discussed at [place] of Stay Meetings, dates discussed:    Additional Comments:  Adron Bene, RN 03/22/2017, 10:37 AM

## 2017-03-22 NOTE — Discharge Instructions (Signed)
Antibiotic Medicine, Adult  Antibiotic medicines treat infections caused by a type of germ called bacteria. They work by killing the bacteria that make you sick.  When do I need to take antibiotics?  You often need these medicines to treat bacterial infections, such as:  · A urinary tract infection (UTI).  · Strep throat.  · Meningitis. This affects the spinal cord and brain.  · A bad lung infection.    You may start the medicines while your doctor waits for tests to come back. When the tests come back, your doctor may change or stop your medicine.  When are antibiotics not needed?  You do not need these medicines for most common illnesses, such as:  · A cold.  · The flu.  · A sore throat.    Antibiotics are not always needed for all infections caused by bacteria. Do not ask for these medicines, or take them, when they are not needed.  What are the risks of taking antibiotics?  Most antibiotics can cause an infection called Clostridium difficile.This causes watery poop (diarrhea). Let your doctor know right away if:  · You have watery poop while taking an antibiotic.  · You have watery poop after you stop taking an antibiotic. The illness can happen weeks after you stop the medicine.    You also have a risk of getting an infection in the future that antibiotics cannot treat (antibiotic-resistant infection). This type of infection can be dangerous.  What else should I know about taking antibiotics?  · You need to take the entire prescription.  ? Take the medicine for as long as told by your doctor.  ? Do not stop taking it even if you start to feel better.  · Try not to miss any doses. If you miss a dose, call your doctor.  · Birth control pills may not work. If you take birth control pills:  ? Keep on taking them.  ? Use a second form of birth control, such as a condom. Do this for as long as told by your doctor.  · Ask your doctor:  ? How long to wait in between doses.  ? If you should take the medicine with  food.  ? If there is anything you should stay away from while taking the antibiotic, such as:  ? Food.  ? Drinks.  ? Medicines.  ? If there are any side effects you should watch for.  · Only take the medicines that your doctor told you to take. Do not take medicines that were given to someone else.  · Drink a large glass of water with the medicine.  · Ask the pharmacist for a tool to measure the medicine, such as:  ? A syringe.  ? A cup.  ? A spoon.  · Throw away any extra medicine.  Contact a doctor if:  · You get worse.  · You have new joint pain or muscle aches after starting the medicine.  · You have side effects from the medicine, such as:  ? Stomach pain.  ? Watery poop.  ? Feeling sick to your stomach (nausea).  Get help right away if:  · You have signs of a very bad allergic reaction. If this happens, stop taking the medicine right away. Signs may include:  ? Hives. These are raised, itchy, red bumps on the skin.  ? Skin rash.  ? Trouble breathing.  ? Wheezing.  ? Swelling.  ? Feeling dizzy.  ? Throwing up (  vomiting).  · Your pee (urine) is dark, or is the color of blood.  · Your skin turns yellow.  · You bruise easily.  · You bleed easily.  · You have very bad watery poop and cramps in your belly.  · You have a very bad headache.  Summary  · Antibiotics are often used to treat infections caused by bacteria.  · Only take these medicines when needed.  · Let your doctor know if you have watery poop while taking an antibiotic.  · You need to take the entire prescription.  This information is not intended to replace advice given to you by your health care provider. Make sure you discuss any questions you have with your health care provider.  Document Released: 04/25/2008 Document Revised: 07/19/2016 Document Reviewed: 07/19/2016  Elsevier Interactive Patient Education © 2017 Elsevier Inc.

## 2017-03-22 NOTE — Progress Notes (Signed)
[facility] Progress Note   Subjective: Complaining about not getting HD. Has been confrontation to DC planners. Denies this. Refusing SNF. Deemed to have capacity per pscyh. Hopefully DC home today.   Objective Vitals:   03/21/17 1802 03/21/17 2132 03/22/17 0509 03/22/17 0700  BP: 110/81 118/76 126/69 112/69  Pulse: 80 74 68 92  Resp: 17 18 19 18   Temp: 97.7 F (36.5 C) 98.6 F (37 C) 98.1 F (36.7 C) 98.1 F (36.7 C)  TempSrc: Oral Oral Oral Oral  SpO2: 100% 100% 100% 95%  Weight:  71.6 kg (157 lb 13.6 oz)    Height:       Physical Exam General: Chronically ill appearing looks older than stated age, NAD Heart: RRR 2/6 systolic M Lungs: CTAB  Abdomen: Soft, nontender Extremities:  has bipedal edema. Woody appearance LE. Drsg L groin, L thigh.  Dialysis Access: LFA AVF + bruit   Additional Objective Labs: Basic Metabolic Panel:  Recent Labs Lab 03/17/17 0943 03/20/17 0725  NA 135 129*  K 4.2 5.4*  CL 96* 91*  CO2 23 22  GLUCOSE 122* 97  BUN 39* 43*  CREATININE 6.80* 7.23*  CALCIUM 8.6* 9.4  PHOS 3.9 4.6   Liver Function Tests:  Recent Labs Lab 03/17/17 0943 03/20/17 0725  ALBUMIN 2.4* 2.5*   No results for input(s): LIPASE, AMYLASE in the last 168 hours. CBC:  Recent Labs Lab 03/17/17 0943 03/20/17 0724  WBC 14.0* 15.6*  HGB 10.7* 11.5*  HCT 34.5* 36.4*  MCV 88.7 87.7  PLT 187 183   Blood Culture    Component Value Date/Time   SDES WOUND LEFT THIGH 03/14/2017 1132   SDES WOUND LEFT THIGH 03/14/2017 1132   SPECREQUEST VEIN HARVEST INCISION POF FORTAZ 03/14/2017 1132   SPECREQUEST VEIN HARVEST INCISION POF FORTAZ 03/14/2017 1132   CULT NO ANAEROBES ISOLATED 03/14/2017 1132   CULT RARE PSEUDOMONAS AERUGINOSA 03/14/2017 1132   REPTSTATUS 03/19/2017 FINAL 03/14/2017 1132   REPTSTATUS 03/17/2017 FINAL 03/14/2017 1132    Cardiac Enzymes: No results for input(s): CKTOTAL, CKMB, CKMBINDEX, TROPONINI in the last 168  hours. CBG: No results for input(s): GLUCAP in the last 168 hours. Iron Studies: No results for input(s): IRON, TIBC, TRANSFERRIN, FERRITIN in the last 72 hours. @lablastinr3 @ Studies/Results: No results found. Medications: . cefTAZidime (FORTAZ)  IV Stopped (03/20/17 1737)   . calcitRIOL  1.25 mcg Oral Q T,Th,Sat-1800  . calcium acetate  2,001 mg Oral TID WC  . cinacalcet  90 mg Oral Q T,Th,Sat-1800  . enoxaparin (LOVENOX) injection  30 mg Subcutaneous Q24H  . feeding supplement (NEPRO CARB STEADY)  237 mL Oral BID BM  . feeding supplement (PRO-STAT SUGAR FREE 64)  30 mL Oral BID  . gabapentin  100 mg Oral BID  . midodrine  10 mg Oral TID WC  . multivitamin  1 tablet Oral QHS  . senna  1 tablet Oral BID  . sodium chloride flush  3 mL Intravenous Q12H    Dialysis Orders:  TTS at [facility] 3:45hr, BFR 400, DFR 800, EDW 72kg, 2K/2Ca bath, AVF, Heparin 1400 bolus - Calcitriol 1.6mcg PO q HD - Venofer 100mg  x 10 ordered (4 given so far) for tsat 20% on 7/26. - Was getting sensipar 180mg  PO q HD (held on 7/28 d/t low Ca)  Assessment/Plan: 1. Sepsis / infected wounds (L groin and L thigh): growing P aeruginosa.Now on IV Fortazper ID to be completed on Sept 1st (= 3 wks Rx).  2. PAD/L foot gangrene: VVS following. Patient declining amputations. Stable for DC from vascular prospective 3. Recurrent L groin lymphocele: S/p excision lymphocele on 8/8; spincision debridement of L groin  4. ESRD -T,Th,S HD today K+ 5.4 2.0 K bath.  5. Anemia - HGB 11.5 No ESA needed.Follow HGB  6. Secondary hyperparathyroidism - Cont binders/VDRA 7.HTN/volume - BP controlled, on soft side. Cont midodrine. HD 03/20/17 Pre wt 72.2 kg  Net UF 2.0 liters  Post wt 69.8 kg. Lower EDW to 69.5 kg on DC.  8. Nutrition - Renal diet/vitamins  9. Debility - for SNF, however now refusing unless he can see facility first.  10. Psych -Had compentency eval due to refusing treatments. Deemed to have  capacity.  Disposition: DC home today per primary.    Rita H. Brown NP-C 03/22/2017, 10:32 AM  [facility] [phone]  Pt seen, examined and agree w A/P as above.  Vinson Moselle MD [employer] pager [phone]   03/22/2017, 12:11 PM

## 2017-03-22 NOTE — Progress Notes (Signed)
Dialysis treatment completed.  1100 mL ultrafiltrated and net fluid removal 600 mL.    Patient status unchanged. Lung sounds diminished to ausculation in all fields. BLE  edema. Cardiac: NSR.  Disconnected lines and removed needles.  Pressure held for 10 minutes and band aid/gauze dressing applied.  Report given to bedside RN, Herbert Seta.

## 2017-03-27 ENCOUNTER — Telehealth: Payer: Self-pay

## 2017-03-27 NOTE — Telephone Encounter (Signed)
Alexis [facility]/Nurse called to request orders to be changed from daily wet to dry dressings R medial calf to hydrogel dressing three times per week for two weeks. Reason being she explained was that she is concerned about the cleanliness and the technique that the pt is using to do dressing changes in between the days that they are not coming out. Gave verbal order for hydrogel dressing three times per week for two weeks then twice weekly for two weeks. Jon Gills also stated she was concerned about the area between his Great and second toe breaking down and draining. Gave verbal to clean that area and apply dry gauze.   Jon Gills will call back on Wednesday to discuss moving pt's 9/11 appt up to 8:30 due to dialysis. I told her to ask to speak with me Doyne Keel)

## 2017-03-30 ENCOUNTER — Telehealth: Payer: Self-pay

## 2017-03-30 NOTE — Telephone Encounter (Signed)
Pt called in LVM "Im hurting so bad, I need percocet". Returned call to pt and he stated that his feet hurt so bad he needs some pain medication to be called in to [facility]. I informed Mr. Montford that I had spoke with Jon Gills his nurse with [facility] about moving his 9/11 appt up from 1:45 to 8:30. I told him that I called him yesterday to make him aware that it was done. And that she did not mention anything about pain.  Joyce Gross RN, ran a narcotic report and it was noted that he was getting pain medication from Dr. Parke Simmers as well as Dr. Arbie Cookey and we would not be able to give him any additional pain medication, he would need to contact Dr. Tedra Senegal office. In the process of me relaying the information to Mr. Stokes he stated " I can not believe this" and hung up the phone. Called pt back to make sure he understood what I was trying to tell him and he stated he did and hung up.

## 2017-04-04 ENCOUNTER — Telehealth: Payer: Self-pay | Admitting: Cardiology

## 2017-04-04 NOTE — Telephone Encounter (Signed)
Will route to Dr. Katrinka Blazing for review and advisement on fluid restrictions.  Pt's EF on echo from 11/2016 was 15-20%.

## 2017-04-04 NOTE — Telephone Encounter (Signed)
New Message  Alexis from [facility] would like to know if pt is on any fluid restrictions. Please call back to discuss

## 2017-04-05 ENCOUNTER — Encounter (HOSPITAL_COMMUNITY): Payer: Self-pay | Admitting: *Deleted

## 2017-04-05 ENCOUNTER — Emergency Department (HOSPITAL_COMMUNITY)
Admission: EM | Admit: 2017-04-05 | Discharge: 2017-04-06 | Disposition: A | Discharge status: Home / Self Care | Payer: Medicare Other | Attending: Emergency Medicine | Admitting: Emergency Medicine

## 2017-04-05 DIAGNOSIS — Z950 Presence of cardiac pacemaker: Secondary | ICD-10-CM | POA: Insufficient documentation

## 2017-04-05 DIAGNOSIS — I5022 Chronic systolic (congestive) heart failure: Secondary | ICD-10-CM | POA: Diagnosis not present

## 2017-04-05 DIAGNOSIS — N186 End stage renal disease: Secondary | ICD-10-CM

## 2017-04-05 DIAGNOSIS — I132 Hypertensive heart and chronic kidney disease with heart failure and with stage 5 chronic kidney disease, or end stage renal disease: Secondary | ICD-10-CM | POA: Diagnosis not present

## 2017-04-05 DIAGNOSIS — M79604 Pain in right leg: Secondary | ICD-10-CM | POA: Insufficient documentation

## 2017-04-05 DIAGNOSIS — I96 Gangrene, not elsewhere classified: Secondary | ICD-10-CM | POA: Diagnosis present

## 2017-04-05 DIAGNOSIS — F17210 Nicotine dependence, cigarettes, uncomplicated: Secondary | ICD-10-CM | POA: Insufficient documentation

## 2017-04-05 DIAGNOSIS — M79605 Pain in left leg: Secondary | ICD-10-CM | POA: Diagnosis not present

## 2017-04-05 DIAGNOSIS — Z992 Dependence on renal dialysis: Secondary | ICD-10-CM | POA: Insufficient documentation

## 2017-04-05 LAB — COMPREHENSIVE METABOLIC PANEL
ALT: 12 U/L — ABNORMAL LOW (ref 17–63)
ANION GAP: 15 (ref 5–15)
AST: 31 U/L (ref 15–41)
Albumin: 2.7 g/dL — ABNORMAL LOW (ref 3.5–5.0)
Alkaline Phosphatase: 170 U/L — ABNORMAL HIGH (ref 38–126)
BILIRUBIN TOTAL: 0.7 mg/dL (ref 0.3–1.2)
BUN: 18 mg/dL (ref 6–20)
CO2: 29 mmol/L (ref 22–32)
Calcium: 9.9 mg/dL (ref 8.9–10.3)
Chloride: 98 mmol/L — ABNORMAL LOW (ref 101–111)
Creatinine, Ser: 5.96 mg/dL — ABNORMAL HIGH (ref 0.61–1.24)
GFR, EST AFRICAN AMERICAN: 10 mL/min — AB (ref >60)
GFR, EST NON AFRICAN AMERICAN: 9 mL/min — AB (ref >60)
Glucose, Bld: 138 mg/dL — ABNORMAL HIGH (ref 65–99)
Potassium: 2.9 mmol/L — ABNORMAL LOW (ref 3.5–5.1)
Sodium: 142 mmol/L (ref 135–145)
Total Protein: 6.5 g/dL (ref 6.5–8.1)

## 2017-04-05 LAB — CBC WITH DIFFERENTIAL/PLATELET
BASOS ABS: 0 10*3/uL (ref 0.0–0.1)
BASOS PCT: 0 %
EOS ABS: 0.1 10*3/uL (ref 0.0–0.7)
Eosinophils Relative: 1 %
HEMATOCRIT: 36.4 % — AB (ref 39.0–52.0)
HEMOGLOBIN: 11 g/dL — AB (ref 13.0–17.0)
LYMPHS ABS: 1.3 10*3/uL (ref 0.7–4.0)
Lymphocytes Relative: 19 %
MCH: 28.2 pg (ref 26.0–34.0)
MCHC: 30.2 g/dL (ref 30.0–36.0)
MCV: 93.3 fL (ref 78.0–100.0)
Monocytes Absolute: 0.7 10*3/uL (ref 0.1–1.0)
Monocytes Relative: 10 %
Neutro Abs: 4.9 10*3/uL (ref 1.7–7.7)
Neutrophils Relative %: 70 %
Platelets: 190 10*3/uL (ref 150–400)
RBC: 3.9 MIL/uL — ABNORMAL LOW (ref 4.22–5.81)
RDW: 22.7 % — AB (ref 11.5–15.5)
WBC: 7 10*3/uL (ref 4.0–10.5)

## 2017-04-05 LAB — I-STAT CG4 LACTIC ACID, ED
LACTIC ACID, VENOUS: 1.21 mmol/L (ref 0.5–1.9)
Lactic Acid, Venous: 1.66 mmol/L (ref 0.5–1.9)

## 2017-04-05 MED ORDER — OXYCODONE-ACETAMINOPHEN 5-325 MG PO TABS
1.0000 | ORAL_TABLET | Freq: Once | ORAL | Status: AC
  Administered 2017-04-05: 1 via ORAL
  Filled 2017-04-05: qty 1

## 2017-04-05 NOTE — Telephone Encounter (Signed)
2 g sodium diet. 2 L per day fluid restriction.

## 2017-04-05 NOTE — ED Notes (Signed)
Pt calling out for "dope"; pt states he is in a lot of pain and needs something for it now; RN informed him that the MD resident stepped away for an emergency and will return or put orders in for pain relief and pt states "I am an emergency!" RN replied, "yes I understand that and we will address your pain as soon as an order is placed"

## 2017-04-05 NOTE — ED Provider Notes (Signed)
[facility]-EMERGENCY DEPT Provider Note   CSN: [id number] Arrival date & time: 04/05/17  2047     History   Chief Complaint Chief Complaint  Patient presents with  . Foot Pain    HPI Willie Andrade is a 66 y.o. male.  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  Patient denies any fevers, night sweats, chills, dyspnea, chest pain.  Patient request pain control at this time.  He denies any increased purulent drainage from his wound sites.    The history is provided by the patient and medical records.    Past Medical History:  Diagnosis Date  . Chronic systolic CHF (congestive heart failure), NYHA class 2 (HCC)   . ESRD (end stage renal disease) on dialysis (HCC)   . Hepatitis B   . HTN (hypertension)   . Hyperlipidemia   . 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 Peripheral vascular disease (HCC)   . Protein-calorie malnutrition, severe (HCC)   . Tobacco abuse     Patient Active Problem List   Diagnosis Date Noted  . ESRD (end stage renal disease) on dialysis (HCC) 03/16/2017  . PVD (peripheral vascular disease) (HCC) 03/05/2017  . Wound infection 03/05/2017  . Narcotic overdose 02/11/2017  . Sepsis (HCC) 02/11/2017  . Severe protein-calorie malnutrition (HCC) 02/11/2017  . General weakness 01/26/2017  . Wound dehiscence 01/26/2017  . Hypotension   . Cellulitis of left foot 01/17/2017  . Cellulitis 01/17/2017  . Gangrene of toe of left foot (HCC)   . Pre-syncope 12/21/2016  . Chronic systolic heart failure (HCC) 10/11/2016  .  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 Pacemaker 10/10/2016  . ESRD (end stage renal disease) (HCC) 02/14/2016    Past Surgical History:  Procedure Laterality Date  . ABDOMINAL AORTOGRAM W/LOWER EXTREMITY N/A 01/22/2017   Procedure: Abdominal Aortogram w/Lower Extremity;  Surgeon: [name], MD;  Location: [facility] INVASIVE CV LAB;  Service: Cardiovascular;  Laterality: N/A;  . BIV ICD GENERATOR CHANGEOUT N/A 11/28/2016   Procedure: BiV ICD [employer]; St Jude Surgeon: Will Jorja Loa, MD;  Location: [facility] INVASIVE CV LAB;  Service: Cardiovascular;  Laterality: N/A;  . BYPASS GRAFT FEMORAL-PERONEAL Left 01/24/2017   Procedure: Left Leg  FEMORAL-PERONEAL Bypass Graft;  Surgeon: [name], MD;  Location: [facility] OR;  Service: Vascular;  Laterality: Left;  . DRAINAGE AND CLOSURE OF LYMPHOCELE Left 03/07/2017   Procedure: EXCISION AND CLOSURE OF LEFT GROIN LYMPHOCELE;  Surgeon: Fransisco Hertz, MD;  Location: [facility] OR;  Service: Vascular;  Laterality: Left;  . I&D EXTREMITY Left 03/14/2017   Procedure: IRRIGATION AND DEBRIDEMENT LEFT GROIN AND THIGH INCISIONS and LOWER LEG INCISION;  Surgeon: [name], MD;  Location: [facility] OR;  Service: Vascular;  Laterality: Left;       Home Medications    Prior to Admission medications   Medication Sig Start Date End Date Taking? Authorizing Provider  calcitRIOL (ROCALTROL) 0.25 MCG capsule Take 5 capsules (1.25 mcg total) by mouth every Tuesday, Thursday, and Saturday at 6 PM. 03/22/17  Yes Rama, Maryruth Bun, MD  cinacalcet (SENSIPAR) 30 MG tablet Take 3 tablets (90 mg total) by mouth every Tuesday, Thursday, and Saturday at 6 PM. 03/22/17  Yes Rama, Maryruth Bun, MD  midodrine (PROAMATINE) 5 MG tablet Take 2 tablets (10 mg total) by mouth 3 (three) times daily with meals. 01/28/17  Yes Osvaldo Shipper, MD  Nutritional Supplements (FEEDING SUPPLEMENT, NEPRO CARB STEADY,) LIQD Take 237 mLs by mouth  2 (two) times daily between meals. 03/22/17  Yes Rama, Maryruth Bun, MD  oxyCODONE-acetaminophen (PERCOCET/ROXICET) 5-325 MG tablet Take 1-2 tablets by mouth every 6 (six) hours as needed for severe pain. 02/27/17  Yes Early, Kristen Loader, MD  calcium acetate (PHOSLO) 667 MG capsule Take 3 capsules (2,001 mg total) by mouth 3 (three) times daily with meals. Patient not taking: Reported on 04/05/2017 03/22/17   Rama, Maryruth Bun, MD  gabapentin (NEURONTIN) 100 MG capsule Take 1 capsule (100 mg total) by mouth 2 (two) times daily. Patient not taking: Reported on 04/05/2017 03/22/17   Rama, Maryruth Bun, MD  polyethylene glycol (MIRALAX / GLYCOLAX) packet Take 17 g by mouth daily as needed for mild constipation. Patient not taking: Reported on 04/05/2017 01/28/17   Osvaldo Shipper, MD    Family History Family History  Problem Relation Age of Onset  . Diabetes Mother   . Gout Mother   . Stroke Father   . Gout Maternal Grandmother   . Gout Sister   . Asthma Brother     Social History Social History  Substance Use Topics  . Smoking status: Current Every Day Smoker    Packs/day: 1.00    Years: 40.00    Types: Cigarettes  . Smokeless tobacco: Never Used  . Alcohol use No     Allergies   No known allergies   Review of Systems Review of Systems  Constitutional: Negative for chills and fever.  HENT: Negative for ear pain and sore throat.   Eyes: Negative for pain and visual disturbance.  Respiratory: Negative for cough and shortness of breath.   Cardiovascular: Positive for leg swelling. Negative for chest pain and palpitations.  Gastrointestinal: Negative for abdominal pain and vomiting.  Genitourinary: Negative for dysuria and hematuria.  Musculoskeletal: Positive for gait problem. Negative for arthralgias and back pain.  Skin: Positive for wound. Negative for color change and rash.  Neurological: Positive for weakness and numbness. Negative for seizures and syncope.  All other systems reviewed  and are negative.    Physical Exam Updated Vital Signs BP 107/66   Pulse 91   Temp 98.7 F (37.1 C) (Oral)   Resp 14   SpO2 97%   Physical Exam  Constitutional: He appears cachectic. No distress.  HENT:  Head: Normocephalic and atraumatic.  Eyes: Conjunctivae are normal.  Neck: Neck supple.  Cardiovascular: Normal rate and regular rhythm.   No murmur heard. Pulmonary/Chest: Effort normal and breath sounds normal. No respiratory distress.  Abdominal: Soft. There is no tenderness.  Musculoskeletal: He exhibits edema.       Left hip: He exhibits no bony tenderness and no swelling.  Dry gangrene of LLE and RLE first digit. Clear lymphatic drainage present in L groin at surgical site from prior aspiration. Non-purulent.  Neurological: He is alert.  Skin: Skin is warm and dry. He is not diaphoretic.  Psychiatric: He has a normal mood and affect.  Nursing note and vitals reviewed.   ED Treatments / Results  Labs (all labs ordered are listed, but only abnormal  results are displayed) Labs Reviewed  COMPREHENSIVE METABOLIC PANEL - Abnormal; Notable for the following:       Result Value   Potassium 2.9 (*)    Chloride 98 (*)    Glucose, Bld 138 (*)    Creatinine, Ser 5.96 (*)    Albumin 2.7 (*)    ALT 12 (*)    Alkaline Phosphatase 170 (*)    GFR calc non Af Amer 9 (*)    GFR calc Af Amer 10 (*)    All other components within normal limits  CBC WITH DIFFERENTIAL/PLATELET - Abnormal; Notable for the following:    RBC 3.90 (*)    Hemoglobin 11.0 (*)    HCT 36.4 (*)    RDW 22.7 (*)    All other components within normal limits  I-STAT CG4 LACTIC ACID, ED  I-STAT CG4 LACTIC ACID, ED    EKG  EKG Interpretation None       Radiology No results found.  Procedures Procedures (including critical care time)  Medications Ordered in ED Medications  oxyCODONE-acetaminophen (PERCOCET/ROXICET) 5-325 MG per tablet 1 tablet (1 tablet Oral Given 04/05/17 2157)    oxyCODONE-acetaminophen (PERCOCET/ROXICET) 5-325 MG per tablet 1-2 tablet (2 tablets Oral Given 04/06/17 0010)     Initial Impression / Assessment and Plan / ED Course  I have reviewed the triage vital signs and the nursing notes.  Pertinent labs & imaging results that were available during my care of the patient were reviewed by me and considered in my medical decision making (see chart for details).     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   He has motor function intact in his lower extremities however cannot walk at baseline. He has dry gangrene consistent with prior examinations.  Patient had CBC, CMP, lactic acid performed and consistent with prior laboratory studies.  Patient was given p.o. pain control while in the ED.  He states this improved his pain.  Patient lives at home with his sister.  He has [facility] at [facility] comes to see the patient daily. In the past patient has had issues with following medical advice.  He was discharged from the hospital approximately 2 weeks prior and refused SNF placement.  He has capacity to make his own medical decisions on his own behalf per psychiatry.  At that time he was admitted for sepsis. Patient recently finished outpatient course of IV Fortaz 5 days prior.  Patient has refused amputation of LLE gangrenous site. He has also refused physical therapy. When asked again about PT the patient refused despite his complaints that he "wished to walk again".  Prior to discharge, patient asked for refill on his pain medications, Percocet.  I checked the  [place] controlled substance database which showed patient's last Percocet prescription was filled in February 27, 2017 for 3 day supply.  However upon reading case management notes from 7 days prior, it appears that patient has been having pain medication from Dr. Parke Simmers (PCP) and from Dr. Arbie Cookey (Vascular surgeon). He has an appointment scheduled on 9/11 at 8:30 according to documentation. I informed the patient I did not  comfortable prescribing pain medication at this time given chart documentation.  Return precautions given prior to discharge. All questions answered.  Final Clinical Impressions(s) / ED Diagnoses   Final diagnoses:  Pain in both lower extremities    New Prescriptions New Prescriptions   No medications on file     Shaune Pollack, MD 04/06/17 [id number]    Shaune Pollack, MD 04/06/17 0111

## 2017-04-05 NOTE — Telephone Encounter (Signed)
Spoke with Jon Gills and made her aware of recommendations per Dr. Katrinka Blazing.  Alexis verbalized understanding and was appreciative for call.

## 2017-04-05 NOTE — ED Triage Notes (Signed)
Pt c/o bilateral feet pain; has an appt to see Dr.Evans on the 11th. Bandage applied to R toes; l toes appear black with drainage and swelling. Pt also reports having surgery "next to be penis that feels like I'm peeing on myself"

## 2017-04-06 ENCOUNTER — Telehealth: Payer: Self-pay

## 2017-04-06 MED ORDER — OXYCODONE-ACETAMINOPHEN 5-325 MG PO TABS
1.0000 | ORAL_TABLET | Freq: Once | ORAL | Status: AC
  Administered 2017-04-06: 2 via ORAL
  Filled 2017-04-06: qty 2

## 2017-04-06 NOTE — ED Provider Notes (Signed)
I have personally seen and examined the patient. I have reviewed the documentation on PMH/FH/Soc Hx. I have discussed the plan of care with the resident and patient.  I have reviewed and agree with the resident's documentation. Please see associated encounter note.   EKG Interpretation None           Jakelin Taussig, [name], MD 04/06/17 0001

## 2017-04-06 NOTE — Telephone Encounter (Signed)
S/w Alexis/[facility] nurse concerning Mr. Dilmore message he left concerning his L groin drainage. Jon Gills reported that the pt denies having pain or fever/chills. Also states that the drainage is clear and is "just a slow leak" Pt was seen and evaluated in the ED on yesterday. Advised Alexis to let the pt know to keep the area clean and dry by changing gauze as needed. And if he begins to experience fever/chills to go to the ER. Also reconfirmed his appt on Monday with [facility].

## 2017-04-06 NOTE — ED Notes (Signed)
Pt. refused to sign his discharge documents .

## 2017-04-06 NOTE — ED Notes (Signed)
Discharge plan explained by EDP .

## 2017-04-09 ENCOUNTER — Encounter: Payer: Self-pay | Admitting: Vascular Surgery

## 2017-04-10 ENCOUNTER — Other Ambulatory Visit: Payer: Self-pay

## 2017-04-10 ENCOUNTER — Ambulatory Visit (INDEPENDENT_AMBULATORY_CARE_PROVIDER_SITE_OTHER): Payer: Self-pay | Admitting: Vascular Surgery

## 2017-04-10 ENCOUNTER — Encounter: Payer: Self-pay | Admitting: Vascular Surgery

## 2017-04-10 VITALS — BP 114/70 | HR 69 | Temp 97.1°F | Resp 18 | Ht 74.0 in | Wt 153.0 lb

## 2017-04-10 DIAGNOSIS — Z48812 Encounter for surgical aftercare following surgery on the circulatory system: Secondary | ICD-10-CM

## 2017-04-10 MED ORDER — OXYCODONE-ACETAMINOPHEN 5-325 MG PO TABS: 1.0000 | ORAL_TABLET | Freq: Four times a day (QID) | ORAL | 0 refills | Status: AC | PRN

## 2017-04-10 NOTE — Progress Notes (Signed)
Patient name: Willie Andrade MRN: [id number] DOB: 1951-07-29 Sex: male  REASON FOR VISIT: Here today for continued follow-up. Ports continued bilateral pain and continue status for more Percocet. Was apparently seen in the emergency department yesterday and was not written any refills. In the database search yesterday he had not had any narcotic pain pills since the end of July.  HPI: Willie Andrade is a 66 y.o. male here today for follow-up.  Current Outpatient Prescriptions  Medication Sig Dispense Refill  . calcitRIOL (ROCALTROL) 0.25 MCG capsule Take 5 capsules (1.25 mcg total) by mouth every Tuesday, Thursday, and Saturday at 6 PM. 30 capsule 0  . cinacalcet (SENSIPAR) 30 MG tablet Take 3 tablets (90 mg total) by mouth every Tuesday, Thursday, and Saturday at 6 PM. 60 tablet 0  . midodrine (PROAMATINE) 5 MG tablet Take 2 tablets (10 mg total) by mouth 3 (three) times daily with meals. 30 tablet 6  . Nutritional Supplements (FEEDING SUPPLEMENT, NEPRO CARB STEADY,) LIQD Take 237 mLs by mouth 2 (two) times daily between meals. 30 Can 6  . calcium acetate (PHOSLO) 667 MG capsule Take 3 capsules (2,001 mg total) by mouth 3 (three) times daily with meals. (Patient not taking: Reported on 04/05/2017) 90 capsule 0  . gabapentin (NEURONTIN) 100 MG capsule Take 1 capsule (100 mg total) by mouth 2 (two) times daily. (Patient not taking: Reported on 04/05/2017) 60 capsule 0  . oxyCODONE-acetaminophen (PERCOCET/ROXICET) 5-325 MG tablet Take 1-2 tablets by mouth every 6 (six) hours as needed for severe pain. 20 tablet 0  . polyethylene glycol (MIRALAX / GLYCOLAX) packet Take 17 g by mouth daily as needed for mild constipation. (Patient not taking: Reported on 04/05/2017) 14 each 0   No current facility-administered medications for this visit.      PHYSICAL EXAM: Vitals:   04/10/17 0846  BP: 114/70  Pulse: 69  Resp: 18  Temp: (!) 97.1 F (36.2 C)  TempSrc: Oral    SpO2: 99%  Weight: 153 lb (69.4 kg)  Height:  (1.88 m)    GENERAL: The patient is a well-nourished male, in no acute distress. The vital signs are documented above. Left groin incision is healed with nylon mattress sutures in place which were removed today. Does have some slight serous drainage. At had a lymphocele at this area. Easily palpable femoral to peroneal in situ graft pulse. His medial calf incision has a good granulation base and is contracted Dry gangrene of his fourth toe and the distal portion of his third and second toe on the left. Also has a ulceration between his great and second toe on the right.  MEDICAL ISSUES: Very difficult management situation. Continues to have pain which is chronic for him. Does appear to be demarcating his left fourth toe. Has extensive small vessel disease. I reviewed his preoperative arteriogram with the patient. He had prior superficial femoral artery stenting in order [place] which was patent with his arteriogram in June. He did have severe tibial disease on the left with complete occlusion and reconstitution of his peroneal artery on the left which is patent  On the right leg he had in-line flow from his popliteal via a diseased peroneal with no revascularization options.  I did explain that he is continued risk for a bilateral higher level of amputation. I have referred him to Dr. Aldean Baker regarding his left foot. Would appreciate his opinion regarding the marked rotation of his foot with distal amputation versus formal transmetatarsal amputation. We'll  see him again in one month for continued follow-up area was written a prescription for Percocet 12/01/2023 #20 with no refills   [name], MD [facility] Tel [phone] Pager [phone]

## 2017-04-18 ENCOUNTER — Encounter (INDEPENDENT_AMBULATORY_CARE_PROVIDER_SITE_OTHER): Payer: Self-pay | Admitting: Orthopedic Surgery

## 2017-04-18 ENCOUNTER — Ambulatory Visit (INDEPENDENT_AMBULATORY_CARE_PROVIDER_SITE_OTHER): Payer: Medicare Other | Admitting: Orthopedic Surgery

## 2017-04-18 DIAGNOSIS — E46 Unspecified protein-calorie malnutrition: Secondary | ICD-10-CM

## 2017-04-18 DIAGNOSIS — I96 Gangrene, not elsewhere classified: Secondary | ICD-10-CM

## 2017-04-18 MED ORDER — OXYCODONE-ACETAMINOPHEN 10-325 MG PO TABS: 1.0000 | ORAL_TABLET | Freq: Three times a day (TID) | ORAL | 0 refills | Status: DC | PRN

## 2017-04-18 NOTE — Addendum Note (Signed)
Addended by: Aldean Baker on: 04/18/2017 03:57 PM   Modules accepted: Orders

## 2017-04-18 NOTE — Progress Notes (Addendum)
Office Visit Note   Patient: Willie Andrade           Date of Birth: 1950-12-12           MRN: [id number] Visit Date: 04/18/2017              Requested by: [name], MD [address] [place], [place] [address] PCP: [name], MD  Chief Complaint  Patient presents with  . Right Foot - Pain  . Left Foot - Pain      HPI: Patient is a 14 with severe peripheral vascular disease hypertension end-stage renal disease on dialysis currently smoking who is undergone revascularization procedure to the left lower extremity. Patient presents with chronic painful gangrenous changes to both legs and feet.  Assessment & Plan: Visit Diagnoses:  1. Gangrene of foot (HCC)   2. Protein malnutrition (HCC)     Plan: Discussed that with the global gangrenous changes and pain with both feet a midfoot amputation would not heal and would not provide him any pain relief. Discussed that his only option would be to proceed with bilateral below the knee amputations and that he is at an increased risk of these not healing. Patient states he understands and agrees to proceed with surgery on Friday. Patient will need discharge to skilled nursing until he is able to be independent.  Follow-Up Instructions: Return in about 2 weeks (around 05/02/2017).   Ortho Exam  Patient is alert, oriented, no adenopathy, well-dressed, normal affect, normal respiratory effort. Examination patient is ambulating in a wheelchair. Both feet are cold he has a nonhealing wound from history vascular H procedure on the left mid calf region medially. Patient has thin atrophic skin in both lower extremities patient has exquisite tenderness to palpation with gangrene involving the forefoot midfoot and hindfoot on both lower extremities with gangrene extending of the ankle bilaterally. There is no cellulitis. There is no purulent drainage. Patient is thin and cachectic with severe protein malnutrition.  Imaging: No results found. No  images are attached to the encounter.  Labs: Lab Results  Component Value Date   ESRSEDRATE 26 (H) 01/17/2017   LABURIC 3.6 (L) 01/17/2017   REPTSTATUS 03/19/2017 FINAL 03/14/2017   REPTSTATUS 03/17/2017 FINAL 03/14/2017   GRAMSTAIN  03/14/2017    RARE WBC PRESENT,BOTH PMN AND MONONUCLEAR NO ORGANISMS SEEN    CULT NO ANAEROBES ISOLATED 03/14/2017   CULT RARE PSEUDOMONAS AERUGINOSA 03/14/2017   LABORGA PSEUDOMONAS AERUGINOSA 03/14/2017    Orders:  No orders of the defined types were placed in this encounter.  Meds ordered this encounter  Medications  . oxyCODONE-acetaminophen (PERCOCET) 10-325 MG tablet    Sig: Take 1 tablet by mouth every 8 (eight) hours as needed for pain.    Dispense:  30 tablet    Refill:  0     Procedures: No procedures performed  Clinical Data: No additional findings.  ROS:  All other systems negative, except as noted in the HPI. Review of Systems  Objective: Vital Signs: There were no vitals taken for this visit.  Specialty Comments:  No specialty comments available.  PMFS History: Patient Active Problem List   Diagnosis Date Noted  . ESRD (end stage renal disease) on dialysis (HCC) 03/16/2017  . PVD (peripheral vascular disease) (HCC) 03/05/2017  . Wound infection 03/05/2017  . Narcotic overdose 02/11/2017  . Sepsis (HCC) 02/11/2017  . Severe protein-calorie malnutrition (HCC) 02/11/2017  . General weakness 01/26/2017  . Wound dehiscence 01/26/2017  . Hypotension   .  Cellulitis of left foot 01/17/2017  . Cellulitis 01/17/2017  . Gangrene of toe of left foot (HCC)   . Pre-syncope 12/21/2016  . Chronic systolic heart failure (HCC) 10/11/2016  . 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 Pacemaker 10/10/2016  . ESRD (end stage renal disease) (HCC) 02/14/2016   Past Medical History:  Diagnosis Date  . Chronic systolic CHF (congestive heart failure), NYHA class 2 (HCC)    . ESRD (end stage renal disease) on dialysis (HCC)   . Hepatitis B   . HTN (hypertension)   . Hyperlipidemia   . 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 Peripheral vascular disease (HCC)   . Protein-calorie malnutrition, severe (HCC)   . Tobacco abuse     Family History  Problem Relation Age of Onset  . Diabetes Mother   . Gout Mother   . Stroke Father   . Gout Maternal Grandmother   . Gout Sister   . Asthma Brother     Past Surgical History:  Procedure Laterality Date  . ABDOMINAL AORTOGRAM W/LOWER EXTREMITY N/A 01/22/2017   Procedure: Abdominal Aortogram w/Lower Extremity;  Surgeon: [name], MD;  Location: [facility] INVASIVE CV LAB;  Service: Cardiovascular;  Laterality: N/A;  . BIV ICD GENERATOR CHANGEOUT N/A 11/28/2016   Procedure: BiV ICD QUALCOMM; St Jude Surgeon: Will Jorja Loa, MD;  Location: [facility] INVASIVE CV LAB;  Service: Cardiovascular;  Laterality: N/A;  . BYPASS GRAFT FEMORAL-PERONEAL Left 01/24/2017   Procedure: Left Leg  FEMORAL-PERONEAL Bypass Graft;  Surgeon: [name], MD;  Location: [facility] OR;  Service: Vascular;  Laterality: Left;  . DRAINAGE AND CLOSURE OF LYMPHOCELE Left 03/07/2017   Procedure: EXCISION AND CLOSURE OF LEFT GROIN LYMPHOCELE;  Surgeon: Fransisco Hertz, MD;  Location: [facility] OR;  Service: Vascular;  Laterality: Left;  . I&D EXTREMITY Left 03/14/2017   Procedure: IRRIGATION AND DEBRIDEMENT LEFT GROIN AND THIGH INCISIONS and LOWER LEG INCISION;  Surgeon: [name], MD;  Location: [facility] OR;  Service: Vascular;  Laterality: Left;   Social History   Occupational History  . Not on file.   Social History Main Topics  . Smoking status: Current Every Day Smoker    Packs/day: 1.00    Years: 40.00    Types: Cigarettes  . Smokeless tobacco: Never Used  . Alcohol use No  . Drug use: No  . Sexual activity: Not on file

## 2017-04-19 ENCOUNTER — Other Ambulatory Visit (INDEPENDENT_AMBULATORY_CARE_PROVIDER_SITE_OTHER): Payer: Self-pay | Admitting: Family

## 2017-04-19 NOTE — Progress Notes (Signed)
I was unable to reach patient by phone.  I left  A message on voice mail.  I instructed the patient to arrive at [facility] Main entrance at 8:45 , nothing to eat or drink after midnight.   I instructed the patient to take the following medications in the am with just enough water to get them down:Midordrine; if needed oxycodone.  I asked patient to not wear any lotions, powders, cologne, jewelry, piercing, make-up or nail polish.  I asked the patient to call [phone], in the am if there were any questions or problems.

## 2017-04-20 ENCOUNTER — Inpatient Hospital Stay (HOSPITAL_COMMUNITY)
Admission: RE | Admit: 2017-04-20 | Discharge: 2017-04-30 | DRG: 239 | Disposition: E | Payer: Medicare Other | Source: Ambulatory Visit | Attending: Nephrology | Admitting: Nephrology

## 2017-04-20 ENCOUNTER — Inpatient Hospital Stay (HOSPITAL_COMMUNITY): Payer: Medicare Other | Admitting: Anesthesiology

## 2017-04-20 ENCOUNTER — Encounter (HOSPITAL_COMMUNITY): Payer: Self-pay | Admitting: General Practice

## 2017-04-20 ENCOUNTER — Inpatient Hospital Stay (HOSPITAL_COMMUNITY): Admission: RE | Disposition: E | Payer: Self-pay | Source: Ambulatory Visit | Attending: Orthopedic Surgery

## 2017-04-20 DIAGNOSIS — I462 Cardiac arrest due to underlying cardiac condition: Principal | ICD-10-CM | POA: Diagnosis present

## 2017-04-20 DIAGNOSIS — Z823 Family history of stroke: Secondary | ICD-10-CM

## 2017-04-20 DIAGNOSIS — R011 Cardiac murmur, unspecified: Secondary | ICD-10-CM | POA: Diagnosis present

## 2017-04-20 DIAGNOSIS — I959 Hypotension, unspecified: Secondary | ICD-10-CM | POA: Diagnosis present

## 2017-04-20 DIAGNOSIS — I132 Hypertensive heart and chronic kidney disease with heart failure and with stage 5 chronic kidney disease, or end stage renal disease: Secondary | ICD-10-CM | POA: Diagnosis present

## 2017-04-20 DIAGNOSIS — Y92230 Patient room in hospital as the place of occurrence of the external cause: Secondary | ICD-10-CM | POA: Diagnosis not present

## 2017-04-20 DIAGNOSIS — F17210 Nicotine dependence, cigarettes, uncomplicated: Secondary | ICD-10-CM | POA: Diagnosis present

## 2017-04-20 DIAGNOSIS — I420 Dilated cardiomyopathy: Secondary | ICD-10-CM | POA: Diagnosis present

## 2017-04-20 DIAGNOSIS — I469 Cardiac arrest, cause unspecified: Secondary | ICD-10-CM | POA: Diagnosis not present

## 2017-04-20 DIAGNOSIS — Z89512 Acquired absence of left leg below knee: Secondary | ICD-10-CM | POA: Diagnosis not present

## 2017-04-20 DIAGNOSIS — Z681 Body mass index (BMI) 19 or less, adult: Secondary | ICD-10-CM | POA: Diagnosis not present

## 2017-04-20 DIAGNOSIS — Z950 Presence of cardiac pacemaker: Secondary | ICD-10-CM | POA: Diagnosis not present

## 2017-04-20 DIAGNOSIS — Z01818 Encounter for other preprocedural examination: Secondary | ICD-10-CM

## 2017-04-20 DIAGNOSIS — Z89511 Acquired absence of right leg below knee: Secondary | ICD-10-CM

## 2017-04-20 DIAGNOSIS — Z992 Dependence on renal dialysis: Secondary | ICD-10-CM

## 2017-04-20 DIAGNOSIS — B1910 Unspecified viral hepatitis B without hepatic coma: Secondary | ICD-10-CM | POA: Diagnosis present

## 2017-04-20 DIAGNOSIS — Z9114 Patient's other noncompliance with medication regimen: Secondary | ICD-10-CM

## 2017-04-20 DIAGNOSIS — I739 Peripheral vascular disease, unspecified: Secondary | ICD-10-CM | POA: Diagnosis present

## 2017-04-20 DIAGNOSIS — E162 Hypoglycemia, unspecified: Secondary | ICD-10-CM | POA: Diagnosis not present

## 2017-04-20 DIAGNOSIS — T3995XA Adverse effect of unspecified nonopioid analgesic, antipyretic and antirheumatic, initial encounter: Secondary | ICD-10-CM | POA: Diagnosis not present

## 2017-04-20 DIAGNOSIS — R579 Shock, unspecified: Secondary | ICD-10-CM | POA: Diagnosis not present

## 2017-04-20 DIAGNOSIS — D696 Thrombocytopenia, unspecified: Secondary | ICD-10-CM | POA: Diagnosis present

## 2017-04-20 DIAGNOSIS — I96 Gangrene, not elsewhere classified: Secondary | ICD-10-CM | POA: Diagnosis present

## 2017-04-20 DIAGNOSIS — N186 End stage renal disease: Secondary | ICD-10-CM | POA: Diagnosis present

## 2017-04-20 DIAGNOSIS — Z825 Family history of asthma and other chronic lower respiratory diseases: Secondary | ICD-10-CM

## 2017-04-20 DIAGNOSIS — G9340 Encephalopathy, unspecified: Secondary | ICD-10-CM

## 2017-04-20 DIAGNOSIS — I472 Ventricular tachycardia: Secondary | ICD-10-CM | POA: Diagnosis not present

## 2017-04-20 DIAGNOSIS — D631 Anemia in chronic kidney disease: Secondary | ICD-10-CM | POA: Diagnosis present

## 2017-04-20 DIAGNOSIS — R410 Disorientation, unspecified: Secondary | ICD-10-CM | POA: Diagnosis not present

## 2017-04-20 DIAGNOSIS — I5042 Chronic combined systolic (congestive) and diastolic (congestive) heart failure: Secondary | ICD-10-CM | POA: Diagnosis present

## 2017-04-20 DIAGNOSIS — G92 Toxic encephalopathy: Secondary | ICD-10-CM | POA: Diagnosis not present

## 2017-04-20 DIAGNOSIS — Z66 Do not resuscitate: Secondary | ICD-10-CM | POA: Diagnosis not present

## 2017-04-20 DIAGNOSIS — Z833 Family history of diabetes mellitus: Secondary | ICD-10-CM | POA: Diagnosis not present

## 2017-04-20 DIAGNOSIS — Y828 Other medical devices associated with adverse incidents: Secondary | ICD-10-CM | POA: Diagnosis not present

## 2017-04-20 DIAGNOSIS — D72829 Elevated white blood cell count, unspecified: Secondary | ICD-10-CM | POA: Diagnosis not present

## 2017-04-20 DIAGNOSIS — R571 Hypovolemic shock: Secondary | ICD-10-CM | POA: Diagnosis not present

## 2017-04-20 DIAGNOSIS — R0603 Acute respiratory distress: Secondary | ICD-10-CM | POA: Diagnosis not present

## 2017-04-20 DIAGNOSIS — T380X5A Adverse effect of glucocorticoids and synthetic analogues, initial encounter: Secondary | ICD-10-CM | POA: Diagnosis not present

## 2017-04-20 DIAGNOSIS — R748 Abnormal levels of other serum enzymes: Secondary | ICD-10-CM | POA: Diagnosis not present

## 2017-04-20 DIAGNOSIS — R0902 Hypoxemia: Secondary | ICD-10-CM | POA: Diagnosis present

## 2017-04-20 DIAGNOSIS — N2581 Secondary hyperparathyroidism of renal origin: Secondary | ICD-10-CM | POA: Diagnosis present

## 2017-04-20 DIAGNOSIS — E875 Hyperkalemia: Secondary | ICD-10-CM | POA: Diagnosis not present

## 2017-04-20 DIAGNOSIS — I4901 Ventricular fibrillation: Secondary | ICD-10-CM | POA: Diagnosis not present

## 2017-04-20 DIAGNOSIS — I9788 Other intraoperative complications of the circulatory system, not elsewhere classified: Secondary | ICD-10-CM | POA: Diagnosis not present

## 2017-04-20 DIAGNOSIS — E1152 Type 2 diabetes mellitus with diabetic peripheral angiopathy with gangrene: Secondary | ICD-10-CM | POA: Diagnosis present

## 2017-04-20 DIAGNOSIS — I5022 Chronic systolic (congestive) heart failure: Secondary | ICD-10-CM | POA: Diagnosis not present

## 2017-04-20 DIAGNOSIS — Z79899 Other long term (current) drug therapy: Secondary | ICD-10-CM

## 2017-04-20 DIAGNOSIS — E43 Unspecified severe protein-calorie malnutrition: Secondary | ICD-10-CM | POA: Diagnosis present

## 2017-04-20 DIAGNOSIS — Y838 Other surgical procedures as the cause of abnormal reaction of the patient, or of later complication, without mention of misadventure at the time of the procedure: Secondary | ICD-10-CM | POA: Diagnosis not present

## 2017-04-20 DIAGNOSIS — Z79891 Long term (current) use of opiate analgesic: Secondary | ICD-10-CM

## 2017-04-20 DIAGNOSIS — R079 Chest pain, unspecified: Secondary | ICD-10-CM | POA: Diagnosis not present

## 2017-04-20 DIAGNOSIS — E785 Hyperlipidemia, unspecified: Secondary | ICD-10-CM | POA: Diagnosis present

## 2017-04-20 DIAGNOSIS — I9581 Postprocedural hypotension: Secondary | ICD-10-CM | POA: Diagnosis not present

## 2017-04-20 DIAGNOSIS — E8889 Other specified metabolic disorders: Secondary | ICD-10-CM | POA: Diagnosis present

## 2017-04-20 DIAGNOSIS — I9589 Other hypotension: Secondary | ICD-10-CM | POA: Diagnosis present

## 2017-04-20 DIAGNOSIS — I480 Paroxysmal atrial fibrillation: Secondary | ICD-10-CM | POA: Diagnosis present

## 2017-04-20 DIAGNOSIS — I361 Nonrheumatic tricuspid (valve) insufficiency: Secondary | ICD-10-CM | POA: Diagnosis not present

## 2017-04-20 HISTORY — PX: AMPUTATION: SHX166

## 2017-04-20 HISTORY — PX: APPLICATION OF WOUND VAC: SHX5189

## 2017-04-20 LAB — BASIC METABOLIC PANEL
Anion gap: 19 — ABNORMAL HIGH (ref 5–15)
BUN: 38 mg/dL — ABNORMAL HIGH (ref 6–20)
CHLORIDE: 97 mmol/L — AB (ref 101–111)
CO2: 23 mmol/L (ref 22–32)
Calcium: 10.3 mg/dL (ref 8.9–10.3)
Creatinine, Ser: 6.94 mg/dL — ABNORMAL HIGH (ref 0.61–1.24)
GFR calc Af Amer: 9 mL/min — ABNORMAL LOW (ref >60)
GFR calc non Af Amer: 7 mL/min — ABNORMAL LOW (ref >60)
GLUCOSE: 82 mg/dL (ref 65–99)
POTASSIUM: 4 mmol/L (ref 3.5–5.1)
SODIUM: 139 mmol/L (ref 135–145)

## 2017-04-20 LAB — TROPONIN I: Troponin I: 0.83 ng/mL (ref <0.03)

## 2017-04-20 LAB — CBC WITH DIFFERENTIAL/PLATELET
BASOS ABS: 0 10*3/uL (ref 0.0–0.1)
Basophils Relative: 0 %
EOS ABS: 0 10*3/uL (ref 0.0–0.7)
Eosinophils Relative: 0 %
HCT: 32.3 % — ABNORMAL LOW (ref 39.0–52.0)
Hemoglobin: 10.2 g/dL — ABNORMAL LOW (ref 13.0–17.0)
LYMPHS ABS: 0.9 10*3/uL (ref 0.7–4.0)
Lymphocytes Relative: 3 %
MCH: 30.1 pg (ref 26.0–34.0)
MCHC: 31.6 g/dL (ref 30.0–36.0)
MCV: 95.3 fL (ref 78.0–100.0)
MONO ABS: 1.1 10*3/uL — AB (ref 0.1–1.0)
Monocytes Relative: 4 %
NEUTROS ABS: 26.5 10*3/uL — AB (ref 1.7–7.7)
Neutrophils Relative %: 93 %
Platelets: 146 10*3/uL — ABNORMAL LOW (ref 150–400)
RBC: 3.39 MIL/uL — AB (ref 4.22–5.81)
RDW: 22.3 % — AB (ref 11.5–15.5)
WBC: 28.5 10*3/uL — AB (ref 4.0–10.5)

## 2017-04-20 LAB — POCT I-STAT 4, (NA,K, GLUC, HGB,HCT)
Glucose, Bld: 93 mg/dL (ref 65–99)
HEMATOCRIT: 34 % — AB (ref 39.0–52.0)
HEMOGLOBIN: 11.6 g/dL — AB (ref 13.0–17.0)
Potassium: 3.8 mmol/L (ref 3.5–5.1)
SODIUM: 138 mmol/L (ref 135–145)

## 2017-04-20 LAB — GLUCOSE, CAPILLARY
GLUCOSE-CAPILLARY: 93 mg/dL (ref 65–99)
Glucose-Capillary: 81 mg/dL (ref 65–99)

## 2017-04-20 LAB — MRSA PCR SCREENING: MRSA by PCR: POSITIVE — AB

## 2017-04-20 LAB — PROTIME-INR
INR: 1.87
PROTHROMBIN TIME: 21.3 s — AB (ref 11.4–15.2)

## 2017-04-20 LAB — LACTIC ACID, PLASMA: Lactic Acid, Venous: 2.5 mmol/L (ref 0.5–1.9)

## 2017-04-20 SURGERY — AMPUTATION BELOW KNEE
Anesthesia: General | Site: Leg Lower | Laterality: Bilateral

## 2017-04-20 MED ORDER — ONDANSETRON HCL 4 MG PO TABS: 4.0000 mg | ORAL_TABLET | Freq: Four times a day (QID) | ORAL | Status: DC | PRN

## 2017-04-20 MED ORDER — KETOROLAC TROMETHAMINE 15 MG/ML IJ SOLN
7.5000 mg | Freq: Four times a day (QID) | INTRAMUSCULAR | Status: AC
  Administered 2017-04-20 – 2017-04-21 (×4): 7.5 mg via INTRAVENOUS
  Filled 2017-04-20 (×3): qty 1

## 2017-04-20 MED ORDER — ONDANSETRON HCL 4 MG/2ML IJ SOLN
4.0000 mg | Freq: Four times a day (QID) | INTRAMUSCULAR | Status: DC | PRN
  Administered 2017-04-22: 4 mg via INTRAVENOUS
  Filled 2017-04-20: qty 2

## 2017-04-20 MED ORDER — ARTIFICIAL TEARS OPHTHALMIC OINT
TOPICAL_OINTMENT | OPHTHALMIC | Status: DC | PRN
  Administered 2017-04-20: 1 via OPHTHALMIC

## 2017-04-20 MED ORDER — SODIUM CHLORIDE 0.9 % IV BOLUS (SEPSIS)
1000.0000 mL | Freq: Once | INTRAVENOUS | Status: DC
Start: 2017-04-20 — End: 2017-04-20

## 2017-04-20 MED ORDER — SODIUM CHLORIDE 0.9 % IV SOLN
INTRAVENOUS | Status: DC
  Administered 2017-04-20: 14:00:00 via INTRAVENOUS

## 2017-04-20 MED ORDER — ALBUMIN HUMAN 5 % IV SOLN
12.5000 g | Freq: Once | INTRAVENOUS | Status: AC
  Administered 2017-04-20: 12.5 g via INTRAVENOUS

## 2017-04-20 MED ORDER — METOCLOPRAMIDE HCL 10 MG PO TABS: 5.0000 mg | ORAL_TABLET | Freq: Three times a day (TID) | ORAL | Status: DC | PRN

## 2017-04-20 MED ORDER — FENTANYL CITRATE (PF) 250 MCG/5ML IJ SOLN
INTRAMUSCULAR | Status: AC
  Filled 2017-04-20: qty 5

## 2017-04-20 MED ORDER — PROPOFOL 10 MG/ML IV BOLUS
INTRAVENOUS | Status: AC
  Filled 2017-04-20: qty 20

## 2017-04-20 MED ORDER — GLYCOPYRROLATE 0.2 MG/ML IJ SOLN
INTRAMUSCULAR | Status: DC | PRN
  Administered 2017-04-20: 0.2 mg via INTRAVENOUS

## 2017-04-20 MED ORDER — CEFAZOLIN SODIUM-DEXTROSE 1-4 GM/50ML-% IV SOLN: 1.0000 g | Freq: Four times a day (QID) | INTRAVENOUS | Status: DC

## 2017-04-20 MED ORDER — BISACODYL 10 MG RE SUPP: 10.0000 mg | Freq: Every day | RECTAL | Status: DC | PRN

## 2017-04-20 MED ORDER — KETAMINE HCL 10 MG/ML IJ SOLN
INTRAMUSCULAR | Status: DC | PRN
  Administered 2017-04-20: 20 mg via INTRAVENOUS
  Administered 2017-04-20: 100 mg via INTRAVENOUS

## 2017-04-20 MED ORDER — KETAMINE HCL-SODIUM CHLORIDE 100-0.9 MG/10ML-% IV SOSY
PREFILLED_SYRINGE | INTRAVENOUS | Status: AC
  Filled 2017-04-20: qty 20

## 2017-04-20 MED ORDER — ONDANSETRON HCL 4 MG/2ML IJ SOLN
INTRAMUSCULAR | Status: DC | PRN
  Administered 2017-04-20: 4 mg via INTRAVENOUS

## 2017-04-20 MED ORDER — DOCUSATE SODIUM 100 MG PO CAPS
100.0000 mg | ORAL_CAPSULE | Freq: Two times a day (BID) | ORAL | Status: DC
  Administered 2017-04-21 – 2017-04-22 (×3): 100 mg via ORAL
  Filled 2017-04-20 (×7): qty 1

## 2017-04-20 MED ORDER — ALBUMIN HUMAN 5 % IV SOLN
12.5000 g | Freq: Once | INTRAVENOUS | Status: AC
  Administered 2017-04-20: 12.5 g via INTRAVENOUS
  Filled 2017-04-20: qty 250

## 2017-04-20 MED ORDER — MIDAZOLAM HCL 2 MG/2ML IJ SOLN
INTRAMUSCULAR | Status: AC
  Filled 2017-04-20: qty 2

## 2017-04-20 MED ORDER — SUCCINYLCHOLINE CHLORIDE 20 MG/ML IJ SOLN
INTRAMUSCULAR | Status: DC | PRN
  Administered 2017-04-20: 100 mg via INTRAVENOUS

## 2017-04-20 MED ORDER — HYDROMORPHONE HCL 1 MG/ML IJ SOLN: 1.0000 mg | INTRAMUSCULAR | Status: DC | PRN

## 2017-04-20 MED ORDER — ARTIFICIAL TEARS OPHTHALMIC OINT
TOPICAL_OINTMENT | OPHTHALMIC | Status: AC
  Filled 2017-04-20: qty 3.5

## 2017-04-20 MED ORDER — CALCITRIOL 0.5 MCG PO CAPS
1.7500 ug | ORAL_CAPSULE | ORAL | Status: DC
  Filled 2017-04-20: qty 1

## 2017-04-20 MED ORDER — PHENYLEPHRINE HCL 10 MG/ML IJ SOLN
INTRAVENOUS | Status: DC | PRN
  Administered 2017-04-20: 50 ug/min via INTRAVENOUS

## 2017-04-20 MED ORDER — OXYCODONE HCL 5 MG PO TABS
5.0000 mg | ORAL_TABLET | ORAL | Status: DC | PRN
  Administered 2017-04-20 – 2017-04-22 (×4): 5 mg via ORAL
  Filled 2017-04-20 (×4): qty 1

## 2017-04-20 MED ORDER — 0.9 % SODIUM CHLORIDE (POUR BTL) OPTIME
TOPICAL | Status: DC | PRN
  Administered 2017-04-20: 1000 mL

## 2017-04-20 MED ORDER — PHENYLEPHRINE HCL 10 MG/ML IJ SOLN
0.0000 ug/min | INTRAMUSCULAR | Status: DC
  Administered 2017-04-20: 35 ug/min via INTRAVENOUS
  Filled 2017-04-20: qty 1

## 2017-04-20 MED ORDER — METOCLOPRAMIDE HCL 5 MG/ML IJ SOLN
5.0000 mg | Freq: Three times a day (TID) | INTRAMUSCULAR | Status: DC | PRN
  Filled 2017-04-20: qty 2

## 2017-04-20 MED ORDER — LIDOCAINE 2% (20 MG/ML) 5 ML SYRINGE
INTRAMUSCULAR | Status: AC
  Filled 2017-04-20: qty 5

## 2017-04-20 MED ORDER — MIDAZOLAM HCL 2 MG/2ML IJ SOLN
INTRAMUSCULAR | Status: DC | PRN
  Administered 2017-04-20: 1 mg via INTRAVENOUS

## 2017-04-20 MED ORDER — DARBEPOETIN ALFA 100 MCG/0.5ML IJ SOSY
100.0000 ug | PREFILLED_SYRINGE | INTRAMUSCULAR | Status: DC
  Administered 2017-04-21: 100 ug via INTRAVENOUS
  Filled 2017-04-20: qty 0.5

## 2017-04-20 MED ORDER — ACETAMINOPHEN 325 MG PO TABS
650.0000 mg | ORAL_TABLET | Freq: Four times a day (QID) | ORAL | Status: DC | PRN
  Administered 2017-04-22: 650 mg via ORAL
  Filled 2017-04-20: qty 2

## 2017-04-20 MED ORDER — HYDROMORPHONE HCL 1 MG/ML IJ SOLN: 0.2500 mg | INTRAMUSCULAR | Status: DC | PRN

## 2017-04-20 MED ORDER — ONDANSETRON HCL 4 MG/2ML IJ SOLN
INTRAMUSCULAR | Status: AC
  Filled 2017-04-20: qty 2

## 2017-04-20 MED ORDER — METHOCARBAMOL 1000 MG/10ML IJ SOLN
500.0000 mg | Freq: Four times a day (QID) | INTRAVENOUS | Status: DC | PRN
  Filled 2017-04-20: qty 5

## 2017-04-20 MED ORDER — SODIUM CHLORIDE 0.9 % IV SOLN: INTRAVENOUS | Status: DC

## 2017-04-20 MED ORDER — MAGNESIUM CITRATE PO SOLN
1.0000 | Freq: Once | ORAL | Status: DC | PRN
  Filled 2017-04-20: qty 296

## 2017-04-20 MED ORDER — ALBUMIN HUMAN 5 % IV SOLN
INTRAVENOUS | Status: AC
Start: 2017-04-20 — End: 2017-04-20
  Administered 2017-04-20: 12.5 g via INTRAVENOUS
  Filled 2017-04-20: qty 250

## 2017-04-20 MED ORDER — SODIUM CHLORIDE 0.9 % IV SOLN
0.0000 ug/min | INTRAVENOUS | Status: DC
  Administered 2017-04-20 – 2017-04-21 (×3): 25 ug/min via INTRAVENOUS
  Filled 2017-04-20 (×3): qty 1

## 2017-04-20 MED ORDER — CHLORHEXIDINE GLUCONATE 4 % EX LIQD: 60.0000 mL | Freq: Once | CUTANEOUS | Status: DC

## 2017-04-20 MED ORDER — CEFAZOLIN SODIUM-DEXTROSE 2-4 GM/100ML-% IV SOLN
2.0000 g | INTRAVENOUS | Status: AC
  Administered 2017-04-20: 2 g via INTRAVENOUS
  Filled 2017-04-20: qty 100

## 2017-04-20 MED ORDER — METHOCARBAMOL 500 MG PO TABS
ORAL_TABLET | ORAL | Status: AC
  Filled 2017-04-20: qty 1

## 2017-04-20 MED ORDER — METHOCARBAMOL 500 MG PO TABS
500.0000 mg | ORAL_TABLET | Freq: Four times a day (QID) | ORAL | Status: DC | PRN
  Administered 2017-04-20 – 2017-04-22 (×3): 500 mg via ORAL
  Filled 2017-04-20 (×5): qty 1

## 2017-04-20 MED ORDER — KETOROLAC TROMETHAMINE 15 MG/ML IJ SOLN
INTRAMUSCULAR | Status: AC
  Filled 2017-04-20: qty 1

## 2017-04-20 MED ORDER — ACETAMINOPHEN 650 MG RE SUPP: 650.0000 mg | Freq: Four times a day (QID) | RECTAL | Status: DC | PRN

## 2017-04-20 MED ORDER — POLYETHYLENE GLYCOL 3350 17 G PO PACK
17.0000 g | PACK | Freq: Every day | ORAL | Status: DC | PRN
  Filled 2017-04-20: qty 1

## 2017-04-20 SURGICAL SUPPLY — 38 items
BLADE SAW RECIP 87.9 MT (BLADE) ×4 IMPLANT
BLADE SURG 21 STRL SS (BLADE) ×8 IMPLANT
BNDG COHESIVE 6X5 TAN STRL LF (GAUZE/BANDAGES/DRESSINGS) ×8 IMPLANT
BNDG GAUZE ELAST 4 BULKY (GAUZE/BANDAGES/DRESSINGS) ×8 IMPLANT
CANISTER PREVENA 45 (CANNISTER) ×4 IMPLANT
COVER SURGICAL LIGHT HANDLE (MISCELLANEOUS) ×4 IMPLANT
CUFF TOURNIQUET SINGLE 34IN LL (TOURNIQUET CUFF) ×8 IMPLANT
CUFF TOURNIQUET SINGLE 44IN (TOURNIQUET CUFF) IMPLANT
DRAPE INCISE IOBAN 66X45 STRL (DRAPES) ×16 IMPLANT
DRAPE U-SHAPE 47X51 STRL (DRAPES) ×4 IMPLANT
DRESSING PREVENA PLUS CUSTOM (GAUZE/BANDAGES/DRESSINGS) ×2 IMPLANT
DRSG PREVENA PLUS CUSTOM (GAUZE/BANDAGES/DRESSINGS) ×4
DRSG VAC ATS MED SENSATRAC (GAUZE/BANDAGES/DRESSINGS) ×4 IMPLANT
ELECT REM PT RETURN 9FT ADLT (ELECTROSURGICAL) ×4
ELECTRODE REM PT RTRN 9FT ADLT (ELECTROSURGICAL) ×2 IMPLANT
GLOVE BIOGEL PI IND STRL 9 (GLOVE) ×2 IMPLANT
GLOVE BIOGEL PI INDICATOR 9 (GLOVE) ×2
GLOVE SURG ORTHO 9.0 STRL STRW (GLOVE) ×4 IMPLANT
GOWN STRL REUS W/ TWL XL LVL3 (GOWN DISPOSABLE) ×4 IMPLANT
GOWN STRL REUS W/TWL XL LVL3 (GOWN DISPOSABLE) ×4
KIT BASIN OR (CUSTOM PROCEDURE TRAY) ×4 IMPLANT
KIT ROOM TURNOVER OR (KITS) ×4 IMPLANT
MANIFOLD NEPTUNE II (INSTRUMENTS) ×4 IMPLANT
NS IRRIG 1000ML POUR BTL (IV SOLUTION) ×4 IMPLANT
PACK ORTHO EXTREMITY (CUSTOM PROCEDURE TRAY) ×4 IMPLANT
PAD ARMBOARD 7.5X6 YLW CONV (MISCELLANEOUS) ×4 IMPLANT
PAD NEG PRESSURE SENSATRAC (MISCELLANEOUS) ×4 IMPLANT
SPONGE LAP 18X18 X RAY DECT (DISPOSABLE) IMPLANT
STAPLER VISISTAT 35W (STAPLE) ×4 IMPLANT
STOCKINETTE IMPERVIOUS LG (DRAPES) ×4 IMPLANT
SUT SILK 2 0 (SUTURE) ×2
SUT SILK 2-0 18XBRD TIE 12 (SUTURE) ×2 IMPLANT
SUT VIC AB 1 CTX 27 (SUTURE) ×16 IMPLANT
TOWEL OR 17X26 10 PK STRL BLUE (TOWEL DISPOSABLE) ×4 IMPLANT
TUBE CONNECTING 12'X1/4 (SUCTIONS) ×1
TUBE CONNECTING 12X1/4 (SUCTIONS) ×3 IMPLANT
WND VAC CANISTER 500ML (MISCELLANEOUS) ×8 IMPLANT
YANKAUER SUCT BULB TIP NO VENT (SUCTIONS) ×4 IMPLANT

## 2017-04-20 NOTE — Consult Note (Signed)
[facility] Renal Consultation Note    Indication for Consultation:  Management of ESRD/hemodialysis, anemia, hypertension/volume, and secondary hyperparathyroidism. PCP:  HPI: Willie Andrade is a 66 y.o. male  with ESRD, HTN, systolic HF, and PVD who was admitted s/p scheduled bilateral BKA.  Seen in PACU s/p surgery. Still sedated, slightly hypotensive. Looks comfortable.  Dialyzes TTS at [facility]. He is due for HD tomorrow. Using L AVF without recent issues.   Past Medical History:  Diagnosis Date  . Chronic systolic CHF (congestive heart failure), NYHA class 2 (HCC)   . ESRD (end stage renal disease) on dialysis (HCC)   . Hepatitis B   . HTN (hypertension)   . Hyperlipidemia   . 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 Peripheral vascular disease (HCC)   . Protein-calorie malnutrition, severe (HCC)   . Tobacco abuse    Past Surgical History:  Procedure Laterality Date  . ABDOMINAL AORTOGRAM W/LOWER EXTREMITY N/A 01/22/2017   Procedure: Abdominal Aortogram w/Lower Extremity;  Surgeon: [name], MD;  [facility]: [facility];  Service: Cardiovascular;  Laterality: N/A;  . BIV ICD GENERATOR CHANGEOUT N/A 11/28/2016   Procedure: BiV ICD QUALCOMM; St Jude Surgeon: Will Jorja Loa, MD;  Location: [facility] INVAS[facility] CV LAB;  Service: Cardiovascular;  Laterality: N/A;  . BYPASS GRAFT FEMORAL-PERONEAL Left 01/24/2017   Procedure: Left Leg  FEMORAL-PERONEAL Bypass Graft;  Surgeon: [name], MD;  Location: [facility] OR;  Service: Vascular;  Laterality: Left;  . DRAINAGE AND CLOSURE OF LYMPHOCELE Left 03/07/2017   Procedure: EXCISION AND CLOSURE OF LEFT GROIN LYMPHOCELE;  Surgeon: Fransisco Hertz, MD;  Location: [facility] OR;  Service: Vascular;  Laterality: Left;  . I&D EXTREMITY Left 03/14/2017   Procedure: IRRIGATION AND DEBRIDEMENT LEFT GROIN AND THIGH INCISIONS and LOWER LEG INCISION;  Surgeon: [name], MD;  Location: [facility] OR;  Service: Vascular;  Laterality: Left;   Family History  Problem Relation Age of Onset  . Diabetes Mother   . Gout Mother   . Stroke Father   . Gout Maternal Grandmother   . Gout Sister   . Asthma Brother    Social History:  reports that he has been smoking Cigarettes.  He has a 40.00 pack-year smoking history. He has never used smokeless tobacco. He reports that he does not drink alcohol or use drugs.  ROS: Unable to obtain at this time due to sedation.  Physical Exam: Vitals:   04/14/2017 1336 04/05/2017 1455 04/17/2017 1500 04/25/2017 1505  BP: (!) 86/46 (!) 79/50 (!) 86/56   Pulse: 89  68   Resp: (!) 22 16 (!) 22 18  Temp:  98.1 F (36.7 C)    TempSrc:      SpO2: 95%  (!) 89% 95%  Weight:      Height:         General: Well developed, well nourished, in no acute distress. Head: Normocephalic, atraumatic, sclera non-icteric, mucus membranes are moist. Neck: Supple without lymphadenopathy/masses.  Lungs: Clear bilaterally to auscultation anteriorly without wheezing. CV: RRR; 3/6 systolic murmur. Musculoskeletal:  Strength and tone appear normal for age. Lower extremities: Bilateral BKA in stump shrinkers with wound vacs in place. Neuro: Unable to obtain at this time. Dialysis Access: L forearm AVF + bruit/thrill  No Known Allergies Prior to Admission medications   Medication Sig Start Date End Date Taking? Authorizing Provider  oxyCODONE-acetaminophen (PERCOCET/ROXICET) 5-325 MG tablet Take  1-2 tablets by mouth every 6 (six) hours as needed for severe pain. 04/10/17  Yes Early, Kristen Loader, MD  calcitRIOL (ROCALTROL) 0.25 MCG capsule Take 5 capsules (1.25 mcg total) by mouth every Tuesday, Thursday, and Saturday at 6 PM. Patient not taking: Reported on 03/31/2017 03/22/17   Rama, Maryruth Bun, MD  calcium acetate (PHOSLO) 667 MG capsule Take 3 capsules (2,001 mg total) by mouth 3 (three) times daily with meals. Patient not taking: Reported on 04/05/2017 03/22/17    Rama, Maryruth Bun, MD  cinacalcet (SENSIPAR) 30 MG tablet Take 3 tablets (90 mg total) by mouth every Tuesday, Thursday, and Saturday at 6 PM. Patient not taking: Reported on 04/03/2017 03/22/17   Rama, Maryruth Bun, MD  gabapentin (NEURONTIN) 100 MG capsule Take 1 capsule (100 mg total) by mouth 2 (two) times daily. Patient not taking: Reported on 04/05/2017 03/22/17   Rama, Maryruth Bun, MD  midodrine (PROAMATINE) 5 MG tablet Take 2 tablets (10 mg total) by mouth 3 (three) times daily with meals. Patient not taking: Reported on 04/18/2017 01/28/17   Osvaldo Shipper, MD  Nutritional Supplements (FEEDING SUPPLEMENT, NEPRO CARB STEADY,) LIQD Take 237 mLs by mouth 2 (two) times daily between meals. Patient not taking: Reported on 04/04/2017 03/22/17   Rama, Maryruth Bun, MD  polyethylene glycol (MIRALAX / GLYCOLAX) packet Take 17 g by mouth daily as needed for mild constipation. Patient not taking: Reported on 04/05/2017 01/28/17   Osvaldo Shipper, MD   Current Facility-Administered Medications  Medication Dose Route Frequency Provider Last Rate Last Dose  . 0.9 %  sodium chloride infusion   Intravenous Continuous Gaynelle Adu, MD      . chlorhexidine (HIBICLENS) 4 % liquid 4 application  60 mL Topical Once Adonis Huguenin, NP       Facility-Administered Medications Ordered in Other Encounters  Medication Dose Route Frequency Provider Last Rate Last Dose  . artificial tears (LACRILUBE) ophthalmic ointment    Anesthesia Intra-op Wall, Norm Salt, CRNA   1 application at 04/23/2017 1400  . glycopyrrolate (ROBINUL) injection    Anesthesia Intra-op Wall, Norm Salt, CRNA   0.2 mg at 04/23/2017 1332  . ketamine (KETALAR) injection    Anesthesia Intra-op Wall, Norm Salt, CRNA   20 mg at 04/16/2017 1402  . midazolam (VERSED) injection    Anesthesia Intra-op Wall, Norm Salt, CRNA   1 mg at 04/01/2017 1456  . ondansetron [facility]) injection    Anesthesia Intra-op Wall, Norm Salt, CRNA   4 mg at 04/16/2017  1332  . phenylephrine (NEO-SYNEPHRINE) 0.04 mg/mL in dextrose 5 % 250 mL infusion    Continuous PRN Wall, Norm Salt, CRNA 75 mL/hr at 04/28/2017 1434 50 mcg/min at 04/07/2017 1434  . succinylcholine (ANECTINE) injection    Anesthesia Intra-op Wall, Norm Salt, CRNA   100 mg at 04/02/2017 1353   Labs: Basic Metabolic Panel:  Recent Labs Lab 04/10/2017 1224  NA 138  K 3.8  GLUCOSE 93   CBC:  Recent Labs Lab 04/19/2017 1224  HGB 11.6*  HCT 34.0*   CBG:  Recent Labs Lab 04/17/2017 1457  GLUCAP 93   Dialysis Orders:  TTS at [facility] 3:45h, BFR 400, DFR 800, EDW 71kg, 2K/2Ca, AVF, Heparin 1400 bolus - Mircera IV q 2 weeks (last 9/4) - Calcitriol 1.41mcg PO q HD  Assessment/Plan: 1.  Chronic bilateral LE gangrene, s/p bilateral BKA, 9/21 today: Per ortho. Wound vacs in place. 2.  ESRD: Continue HD per TTS schedule, next HD  9/22. No heparin. 3.  BP/volume: Hypotensive s/p surgery. Per primary team. 4.  Anemia: Expect post-surgical Hgb drop. Will redose Aranesp tomorrow with HD. 5.  Metabolic bone disease: Labs pending. Resume binders once eating. Resume VDRA 9/22. 6.  Systolic HF: Per primary.  Ozzie Hoyle, PA-C 04/26/2017, 3:08 PM  [facility] Pager: [phone]  Pt seen, examined and agree w A/P as above.  Vinson Moselle MD [employer] pager [phone]   04/13/2017, 4:32 PM

## 2017-04-20 NOTE — Progress Notes (Addendum)
Pt has multiple wounds to the left groin, left lower leg, and left foot. Pt sts he has a home health nurse that changes his dressings daily, but she did not change them today. Left groin wound cleansed with 0.9% NS and a dry gauzed placed with medipore tape by Lindaann Pascal, RN.

## 2017-04-20 NOTE — Op Note (Signed)
   Date of Surgery: 04/26/2017  INDICATIONS: Mr. Perault is a 66 y.o.-year-old male who with peripheral vascular disease with gangrenous changes both lower extremities he is status post revascularization to the left lower extremity.Marland Kitchen  PREOPERATIVE DIAGNOSIS: Gangrene bilateral lower extremities  POSTOPERATIVE DIAGNOSIS: Same.  PROCEDURE: Transtibial amputation bilateral lower extremities Application of Prevena wound VAC bilateral lower extremities  SURGEON: Lajoyce Corners, M.D.  ANESTHESIA:  general  IV FLUIDS AND URINE: See anesthesia.  ESTIMATED BLOOD LOSS: min mL.  COMPLICATIONS: None.  DESCRIPTION OF PROCEDURE: The patient was brought to the operating room and underwent a general anesthetic. After adequate levels of anesthesia were obtained patient's lower extremities were  prepped using DuraPrep draped into a sterile field. A timeout was called. The feet were draped out of the sterile field with impervious stockinette. A transverse incision was made 11 cm distal to the tibial tubercle. This curved proximally and a large posterior flap was created. The tibia was transected 1 cm proximal to the skin incision. The fibula was transected just proximal to the tibial incision. The tibia was beveled anteriorly. A large posterior flap was created. The sciatic nerve was pulled cut and allowed to retract. The vascular bundles were suture ligated with 2-0 silk. The deep and superficial fascial layers were closed using #1 Vicryl. The skin was closed using staples and 2-0 nylon. The wound was covered with a Prevena wound VAC. There was a good suction fit. A prosthetic shrinker was applied. Patient was extubated taken to the PACU in stable condition. This was performed for both lower extremities.  Aldean Baker, MD [facility] 2:58 PM

## 2017-04-20 NOTE — Consult Note (Addendum)
PULMONARY / CRITICAL CARE MEDICINE   Name: Willie Andrade MRN: [id number] DOB: 03-10-1951    ADMISSION DATE:  04/21/2017 CONSULTATION DATE:  9/21  REFERRING MD:  Dr. Lajoyce Corners  CHIEF COMPLAINT:  hypotension  HISTORY OF PRESENT ILLNESS:   66 year old male with PMH as below, which is significant for systolic CHF, ESRD on HD, hepatitis B, PVD, and severe protein calorie malnutrition. He is a current smoker. 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 Lajoyce Corners. Intraoperatively he required phenylephrine for BP support, and continued to need this in the post-operative setting. PCCM asked to eval for ICU management.   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  PAST SURGICAL HISTORY: He  has a past surgical history that includes BiV ICD Generator Changeout (N/A, 11/28/2016); ABDOMINAL AORTOGRAM W/LOWER EXTREMITY (N/A, 01/22/2017); Bypass graft femoral-peroneal (Left, 01/24/2017); Drainage and closure of lymphocele (Left, 03/07/2017); and I&D extremity (Left, 03/14/2017).  No Known Allergies  No current facility-administered medications on file prior to encounter.    Current Outpatient Prescriptions on File Prior to Encounter  Medication Sig  . oxyCODONE-acetaminophen (PERCOCET/ROXICET) 5-325 MG tablet Take 1-2 tablets by mouth every 6 (six) hours as needed for severe pain.  . calcitRIOL (ROCALTROL) 0.25 MCG capsule Take 5 capsules (1.25 mcg total) by mouth every Tuesday, Thursday, and Saturday at 6 PM. (Patient not taking: Reported on 04/26/2017)  . calcium acetate (PHOSLO) 667 MG  capsule Take 3 capsules (2,001 mg total) by mouth 3 (three) times daily with meals. (Patient not taking: Reported on 04/05/2017)  . cinacalcet (SENSIPAR) 30 MG tablet Take 3 tablets (90 mg total) by mouth every Tuesday, Thursday, and Saturday at 6 PM. (Patient not taking: Reported on 04/09/2017)  . gabapentin (NEURONTIN) 100 MG capsule Take 1 capsule (100 mg total) by mouth 2 (two) times daily. (Patient not taking: Reported on 04/05/2017)  . midodrine (PROAMATINE) 5 MG tablet Take 2 tablets (10 mg total) by mouth 3 (three) times daily with meals. (Patient not taking: Reported on 04/02/2017)  . Nutritional Supplements (FEEDING SUPPLEMENT, NEPRO CARB STEADY,) LIQD Take 237 mLs by mouth 2 (two) times daily between meals. (Patient not taking: Reported on 04/12/2017)  . polyethylene glycol (MIRALAX / GLYCOLAX) packet Take 17 g by mouth daily as needed for mild constipation. (Patient not taking: Reported on 04/05/2017)    FAMILY HISTORY:  His indicated that his mother is deceased. He indicated that his father is deceased. He indicated that his sister is alive. He indicated that his brother is alive. He indicated that his maternal grandmother is deceased. He indicated that his maternal grandfather is deceased. He indicated that his paternal grandmother is deceased. He indicated that his paternal grandfather is deceased.    SOCIAL HISTORY: He  reports that he has been smoking Cigarettes.  He has a 40.00 pack-year smoking history. He has never used smokeless tobacco. He reports that he does not drink alcohol or use drugs.  REVIEW OF SYSTEMS:   Bolds are positive  Constitutional: weight loss, gain, night sweats, Fevers, chills, fatigue .  HEENT: headaches, Sore throat, sneezing, nasal congestion, post nasal drip, Difficulty swallowing, Tooth/dental problems, visual complaints  visual changes, ear ache CV:  chest pain,worse with deep breathing and palpation radiates:,Orthopnea, PND, swelling in lower extremities  dizziness, palpitations, syncope.  GI  heartburn, indigestion, abdominal pain, nausea, vomiting, diarrhea, change in bowel habits, loss of appetite, bloody stools.  Resp: cough, productive:, hemoptysis, dyspnea, chest pain, pleuritic.  Skin: rash or itching or icterus GU: dysuria, change in color of urine, urgency or frequency. flank pain, hematuria  MS: leg pain or swelling. decreased range of motion  Psych: change in mood or affect. depression or anxiety.  Neuro: difficulty with speech, weakness, numbness, ataxia    SUBJECTIVE:    VITAL SIGNS: BP (!) 97/57   Pulse 87   Temp 98.1 F (36.7 C)   Resp 19   Ht  (1.88 m)   Wt 69.4 kg (153 lb)   SpO2 100%   BMI 19.64 kg/m   HEMODYNAMICS:    VENTILATOR SETTINGS:    INTAKE / OUTPUT: No intake/output data recorded.  PHYSICAL EXAMINATION: General:  Chronically ill appearing black male in NAD Neuro:  Alert, oriented, non-focal HEENT:  [place]/AT, PERRL, no JVD Cardiovascular: RRR, no MRG Lungs:  Clear Abdomen:  Soft, generalized tenderness, non-distended Musculoskeletal:  No acute deformity. Wound vac in place to bilateral stump.  Skin: Grossly intact.  LABS:  BMET  Recent Labs Lab 05-12-17 1224  NA 138  K 3.8  GLUCOSE 93    Electrolytes No results for input(s): CALCIUM, MG, PHOS in the last 168 hours.  CBC  Recent Labs Lab 05/12/17 1224  HGB 11.6*  HCT 34.0*    Coag's No results for input(s): APTT, INR in the last 168 hours.  Sepsis Markers No results for input(s): LATICACIDVEN, PROCALCITON, O2SATVEN in the last 168 hours.  ABG No results for input(s): PHART, PCO2ART, PO2ART in the last 168 hours.  Liver Enzymes No results for input(s): AST, ALT, ALKPHOS, BILITOT, ALBUMIN in the last 168 hours.  Cardiac Enzymes No results for input(s): TROPONINI, PROBNP in the last 168 hours.  Glucose  Recent Labs Lab 2017/05/12 1457  GLUCAP 93    Imaging No results found.   STUDIES:     CULTURES:   ANTIBIOTICS: Ancef 9/21 periop  SIGNIFICANT EVENTS: 9/21 admit for bilateral BKA, hypotensive post-op  LINES/TUBES:   DISCUSSION: 66 year old male with complex medical history taken for bilateral BKA and remained hypotensive post-operatively. To ICU on phenylephrine.   ASSESSMENT / PLAN:  PULMONARY A: Tobacco abuse  P:   monitor  CARDIOVASCULAR A:  Hypotension/shock, likely hypovolemic vs medication related. Also some chronic hypotension. Chronic systolic CHF Atrial fibrillation Chronic hypotension (on midodrine, reported not taking)  P:  Telemetry monitoring 1L bolused, will continue gentle IVF Resume midodrine when more awake and able to take PO Currently off pressors with MAP>65, but remains borderline Will transfer to ICU for monitoring EKG troponin Lactic  RENAL A:   ESRD on HD  P:   Nephrology following.  Repeat BMP  GASTROINTESTINAL A:   No acute issues  P:   NPO for now protonix  HEMATOLOGIC A:   Anemia of chronic illness- additionally, EBL during surgery 200 cc  P:  Repeat CBC post op Coags Transfuse for Hgb < 7  INFECTIOUS A:   No acute issues  P:   PPX ABX per ortho  ENDOCRINE A:   No acute issues    P:   Follow BMP for glucose  NEUROLOGIC A:   Acute encephalopathy secondary to pain medication.  P:   RASS  goal: 0 Conservative use of narcotic medications.     FAMILY  - Updates: patient updated in PACU  - Inter-disciplinary family meet or Palliative Care meeting due by:  9/28  Joneen Roach, AGACNP-BC [place] [facility] Pager [phone] or [phone]  04/26/2017 5:26 PM  STAFF NOTE  I, Dr Newell Coral have personally reviewed patient's available data, including medical history, events of note, physical examination and test results as part of my evaluation. I have discussed with ACNP Mikey Bussing and other care providers such as pharmacist, RN and RRT.  I agree with the  assessment and plan stated above. In addition,  I personally evaluated patient.   66 yr old male with a PMHx of ESRD on HD TTS schedule and chronic hypotension non compliant with his midodrine, hep B, PVD, BiVICDwho presented on 9/21 with chronic gangrenous bilateral lower extremities. And is now s/p Transtibial amputation bilateral lower extremities with Application of Prevena wound VAC to bilateral lower extremities. Was hypotensive post surgery requiring management within a critical care setting. Was started on Phenylephrine ggt.  Hypotension in the setting of End stage renal disease. Pt restarted on his midodrine 10 mg TID wean off Phenylephrine with MAP goal>60 , Anemia of chronic disease, will resume Aranesp with his HD on 9/22 per nephrology's recommendations. No signs of active infection. On prophylactic antibiotics per Orthopedics recs.    The patient is critically ill with multiple organ systems failure and requires high complexity decision making for assessment and support, frequent evaluation and titration of therapies, application of advanced monitoring technologies and extensive interpretation of multiple databases.  Critical Care Time devoted to patient care services described in this note is 33 Minutes. This time reflects time of care of this signee Dr Newell Coral. This critical care time does not reflect procedure time, or teaching time or supervisory time of PA/NP/Med student/Med Resident etc but could involve care discussion time    Dr. Newell Coral Pulmonary Critical Care Medicine Locums  04/12/2017 9:21 PM

## 2017-04-20 NOTE — Anesthesia Postprocedure Evaluation (Signed)
Anesthesia Post Note  Patient: Minard Borjas  Procedure(s) Performed: Procedure(s) (LRB): BILATERAL BELOW KNEE AMPUTATION (Bilateral) APPLICATION OF WOUND VAC, bilateral below knee amputation stumps (Bilateral)     Patient location during evaluation: PACU Anesthesia Type: General Level of consciousness: awake and alert Pain management: pain level controlled Vital Signs Assessment: post-procedure vital signs reviewed and stable Respiratory status: spontaneous breathing, nonlabored ventilation, respiratory function stable and patient connected to nasal cannula oxygen Cardiovascular status: blood pressure returned to baseline and stable Postop Assessment: no apparent nausea or vomiting Anesthetic complications: no    Last Vitals:  Vitals:   04/28/2017 1730 04/27/2017 1745  BP: (!) 93/55 94/60  Pulse: 87 90  Resp: 20 (!) 21  Temp:    SpO2: 97% 100%    Last Pain:  Vitals:   04/29/2017 1745  TempSrc:   PainSc: Asleep                 Carmellia Kreisler,W. EDMOND

## 2017-04-20 NOTE — Progress Notes (Addendum)
Received from OR with Phenylephrine at 70mcg/min. running.

## 2017-04-20 NOTE — Progress Notes (Signed)
eLink Physician-Brief Progress Note Patient Name: Willie Andrade DOB: 1951/03/25 MRN: [id number]   Date of Service  04/15/2017  HPI/Events of Note  Admitted post BKA with gangrene. Extubated. Borderline BP but chronically on Midodrine. Has been seen by NP. Camera shows patient with nurse at bedside. No distress.   eICU Interventions  Intensivist to assess at bedside.      Intervention Category Evaluation Type: New Patient Evaluation  [name] 04/12/2017, 7:08 PM

## 2017-04-20 NOTE — Progress Notes (Signed)
PHARMACY NOTE:  ANTIMICROBIAL RENAL DOSAGE ADJUSTMENT  Current antimicrobial regimen includes a mismatch between antimicrobial dosage and estimated renal function.  As per policy approved by the Pharmacy & Therapeutics and Medical Executive Committees, the antimicrobial dosage will be adjusted accordingly.  Current antimicrobial dosage:  Ancef 1gm IV q6h x3  Indication: post-op prophylaxis  Renal Function:  Estimated Creatinine Clearance: 11.3 mL/min (A) (by C-G formula based on SCr of 5.96 mg/dL (H)). [x]      On intermittent HD, scheduled:TTS []      On CRRT    Antimicrobial dosage note:  Patient received pre-op ancef 2 gm x1 at ~2PM.  With patient being on HD with next session scheduled for tomorrow, No additional post-op dose is needed as pre-op dose will last for 24 hrs. - will d/c post-op ancef x3 order  Thank you for allowing pharmacy to be a part of this patient's care.  Lucia Gaskins, [facility] 04/22/17 7:06 PM

## 2017-04-20 NOTE — Transfer of Care (Signed)
Immediate Anesthesia Transfer of Care Note  Patient: Willie Andrade  Procedure(s) Performed: Procedure(s): BILATERAL BELOW KNEE AMPUTATION (Bilateral) APPLICATION OF WOUND VAC, bilateral below knee amputation stumps (Bilateral)  Patient Location: PACU  Anesthesia Type:General  Level of Consciousness: sedated  Airway & Oxygen Therapy: Patient Spontanous Breathing and Patient connected to face mask oxygen  Post-op Assessment: Report given to RN and Post -op Vital signs reviewed and unstable, Anesthesiologist notified  Post vital signs: Reviewed and unstable  Last Vitals:  Vitals:   05-02-2017 1525 05/02/17 1530  BP: 101/68 98/69  Pulse:    Resp: 18 17  Temp:    SpO2: 96% 100%    Last Pain:  Vitals:   May 02, 2017 1525  TempSrc:   PainSc: 0-No pain      Patients Stated Pain Goal: 4 (05-02-2017 1321)  Complications: No apparent anesthesia complications

## 2017-04-20 NOTE — Anesthesia Preprocedure Evaluation (Addendum)
Anesthesia Evaluation  Patient identified by MRN, date of birth, ID band Patient awake    Reviewed: Allergy & Precautions, H&P , NPO status , Patient's Chart, lab work & pertinent test results  Airway Mallampati: III  TM Distance: >3 FB Neck ROM: Full    Dental no notable dental hx. (+) Partial Lower, Partial Upper, Dental Advisory Given   Pulmonary Current Smoker,    Pulmonary exam normal breath sounds clear to auscultation       Cardiovascular hypertension, + Peripheral Vascular Disease and +CHF  + pacemaker  Rhythm:Regular Rate:Normal     Neuro/Psych negative neurological ROS  negative psych ROS   GI/Hepatic negative GI ROS, (+) Hepatitis -, B  Endo/Other  negative endocrine ROS  Renal/GU ESRF and DialysisRenal disease  negative genitourinary   Musculoskeletal   Abdominal   Peds  Hematology negative hematology ROS (+)   Anesthesia Other Findings   Reproductive/Obstetrics negative OB ROS                             Anesthesia Physical Anesthesia Plan  ASA: IV  Anesthesia Plan: General   Post-op Pain Management:    Induction: Intravenous  PONV Risk Score and Plan: 2 and Ondansetron, Dexamethasone and Midazolam  Airway Management Planned: Oral ETT  Additional Equipment:   Intra-op Plan:   Post-operative Plan: Extubation in OR  Informed Consent: I have reviewed the patients History and Physical, chart, labs and discussed the procedure including the risks, benefits and alternatives for the proposed anesthesia with the patient or authorized representative who has indicated his/her understanding and acceptance.   Dental advisory given  Plan Discussed with: CRNA  Anesthesia Plan Comments:         Anesthesia Quick Evaluation

## 2017-04-20 NOTE — Anesthesia Procedure Notes (Signed)
Procedure Name: Intubation Date/Time: 04/09/2017 1:56 PM Performed by: Roderic Palau Pre-anesthesia Checklist: Patient identified, Emergency Drugs available, Suction available and Patient being monitored Patient Re-evaluated:Patient Re-evaluated prior to induction Oxygen Delivery Method: Circle system utilized Preoxygenation: Pre-oxygenation with 100% oxygen Induction Type: IV induction Ventilation: Mask ventilation without difficulty Laryngoscope Size: Mac and 3 Grade View: Grade I Tube type: Oral Tube size: 7.0 mm Number of attempts: 1 Placement Confirmation: ETT inserted through vocal cords under direct vision,  positive ETCO2 and breath sounds checked- equal and bilateral Secured at: 22 cm Tube secured with: Tape

## 2017-04-20 NOTE — H&P (Signed)
Willie Andrade is an 66 y.o. male.   Chief Complaint: Gangrenous changes bilateral lower extremities HPI: Patient is a 81 with severe peripheral vascular disease hypertension end-stage renal disease on dialysis currently smoking who is undergone revascularization procedure to the left lower extremity. Patient presents with chronic painful gangrenous changes to both legs and feet.  Past Medical History:  Diagnosis Date  . Chronic systolic CHF (congestive heart failure), NYHA class 2 (HCC)   . ESRD (end stage renal disease) on dialysis (HCC)   . Hepatitis B   . HTN (hypertension)   . Hyperlipidemia   . 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 Peripheral vascular disease (HCC)   . Protein-calorie malnutrition, severe (HCC)   . Tobacco abuse     Past Surgical History:  Procedure Laterality Date  . ABDOMINAL AORTOGRAM W/LOWER EXTREMITY N/A 01/22/2017   Procedure: Abdominal Aortogram w/Lower Extremity;  Surgeon: [name], MD;  Location: [facility] INVASIVE CV LAB;  Service: Cardiovascular;  Laterality: N/A;  . BIV ICD GENERATOR CHANGEOUT N/A 11/28/2016   Procedure: BiV ICD [employer]; St Jude Surgeon: Will Jorja Loa, MD;  Location: [facility] INVASIVE CV LAB;  Service: Cardiovascular;  Laterality: N/A;  . BYPASS GRAFT FEMORAL-PERONEAL Left 01/24/2017   Procedure: Left Leg  FEMORAL-PERONEAL Bypass Graft;  Surgeon: [name], MD;  Location: [facility] OR;  Service: Vascular;  Laterality: Left;  . DRAINAGE AND CLOSURE OF LYMPHOCELE Left 03/07/2017   Procedure: EXCISION AND CLOSURE OF LEFT GROIN LYMPHOCELE;  Surgeon: Fransisco Hertz, MD;  Location: [facility] OR;  Service: Vascular;  Laterality: Left;  . I&D EXTREMITY Left 03/14/2017   Procedure: IRRIGATION AND DEBRIDEMENT LEFT GROIN AND THIGH INCISIONS and LOWER LEG INCISION;  Surgeon: [name], MD;  Location: [facility] OR;  Service: Vascular;  Laterality: Left;    Family History  Problem Relation Age of  Onset  . Diabetes Mother   . Gout Mother   . Stroke Father   . Gout Maternal Grandmother   . Gout Sister   . Asthma Brother    Social History:  reports that he has been smoking Cigarettes.  He has a 40.00 pack-year smoking history. He has never used smokeless tobacco. He reports that he does not drink alcohol or use drugs.  Allergies: No Active Allergies  No prescriptions prior to admission.    No results found for this or any previous visit (from the past 48 hour(s)). No results found.  Review of Systems  All other systems reviewed and are negative.   There were no vitals taken for this visit. Physical Exam  Patient is alert, oriented, no adenopathy, well-dressed, normal affect, normal respiratory effort. Examination patient is ambulating in a wheelchair. Both feet are cold he has a nonhealing wound from history vascular H procedure on the left mid calf region medially. Patient has thin atrophic skin in both lower extremities patient has exquisite tenderness to palpation with gangrene involving the forefoot midfoot and hindfoot on both lower extremities with gangrene extending of the ankle bilaterally. There is no cellulitis. There is no purulent drainage. Patient is thin and cachectic with severe protein malnutrition. Assessment/Plan 1. Gangrene of foot (HCC)   2. Protein malnutrition (HCC)     Plan: Discussed that with the global gangrenous changes and pain with both feet a midfoot amputation would not heal and would not provide him any pain relief. Discussed that his only option would be to proceed with bilateral below  the knee amputations and that he is at an increased risk of these not healing. Patient states he understands and agrees to proceed with surgery on Friday. Patient will need discharge to skilled nursing until he is able to be independent.   Willie Mustard, MD 2017/04/23, 6:45 AM

## 2017-04-21 ENCOUNTER — Inpatient Hospital Stay (HOSPITAL_COMMUNITY): Payer: Medicare Other

## 2017-04-21 ENCOUNTER — Encounter (HOSPITAL_COMMUNITY): Payer: Self-pay | Admitting: Orthopedic Surgery

## 2017-04-21 DIAGNOSIS — I96 Gangrene, not elsewhere classified: Secondary | ICD-10-CM

## 2017-04-21 DIAGNOSIS — Z992 Dependence on renal dialysis: Secondary | ICD-10-CM

## 2017-04-21 DIAGNOSIS — R579 Shock, unspecified: Secondary | ICD-10-CM

## 2017-04-21 DIAGNOSIS — N186 End stage renal disease: Secondary | ICD-10-CM

## 2017-04-21 LAB — RENAL FUNCTION PANEL
ALBUMIN: 1.9 g/dL — AB (ref 3.5–5.0)
ANION GAP: 15 (ref 5–15)
BUN: 45 mg/dL — AB (ref 6–20)
CALCIUM: 10 mg/dL (ref 8.9–10.3)
CO2: 24 mmol/L (ref 22–32)
CREATININE: 7.24 mg/dL — AB (ref 0.61–1.24)
Chloride: 99 mmol/L — ABNORMAL LOW (ref 101–111)
GFR calc Af Amer: 8 mL/min — ABNORMAL LOW (ref >60)
GFR calc non Af Amer: 7 mL/min — ABNORMAL LOW (ref >60)
GLUCOSE: 74 mg/dL (ref 65–99)
PHOSPHORUS: 9.3 mg/dL — AB (ref 2.5–4.6)
Potassium: 4.4 mmol/L (ref 3.5–5.1)
SODIUM: 138 mmol/L (ref 135–145)

## 2017-04-21 LAB — CBC
HCT: 28.9 % — ABNORMAL LOW (ref 39.0–52.0)
HEMOGLOBIN: 9.1 g/dL — AB (ref 13.0–17.0)
MCH: 29.5 pg (ref 26.0–34.0)
MCHC: 31.5 g/dL (ref 30.0–36.0)
MCV: 93.8 fL (ref 78.0–100.0)
PLATELETS: 141 10*3/uL — AB (ref 150–400)
RBC: 3.08 MIL/uL — ABNORMAL LOW (ref 4.22–5.81)
RDW: 21.6 % — AB (ref 11.5–15.5)
WBC: 17.3 10*3/uL — ABNORMAL HIGH (ref 4.0–10.5)

## 2017-04-21 LAB — LACTIC ACID, PLASMA
Lactic Acid, Venous: 2.5 mmol/L (ref 0.5–1.9)
Lactic Acid, Venous: 1.7 mmol/L (ref 0.5–1.9)

## 2017-04-21 LAB — TROPONIN I
TROPONIN I: 0.72 ng/mL — AB (ref <0.03)
TROPONIN I: 0.58 ng/mL — AB (ref <0.03)
Troponin I: 0.61 ng/mL (ref <0.03)

## 2017-04-21 LAB — CORTISOL: CORTISOL PLASMA: 26.1 ug/dL

## 2017-04-21 MED ORDER — LIDOCAINE HCL (PF) 1 % IJ SOLN: 5.0000 mL | INTRAMUSCULAR | Status: DC | PRN

## 2017-04-21 MED ORDER — SODIUM CHLORIDE 0.9 % IV SOLN: 100.0000 mL | INTRAVENOUS | Status: DC | PRN

## 2017-04-21 MED ORDER — MIDODRINE HCL 5 MG PO TABS
10.0000 mg | ORAL_TABLET | Freq: Three times a day (TID) | ORAL | Status: DC
  Administered 2017-04-21 – 2017-04-22 (×4): 10 mg via ORAL
  Filled 2017-04-21 (×9): qty 2

## 2017-04-21 MED ORDER — PENTAFLUOROPROP-TETRAFLUOROETH EX AERO: 1.0000 "application " | INHALATION_SPRAY | CUTANEOUS | Status: DC | PRN

## 2017-04-21 MED ORDER — HYDROCORTISONE NA SUCCINATE PF 100 MG IJ SOLR
50.0000 mg | Freq: Four times a day (QID) | INTRAMUSCULAR | Status: DC
  Administered 2017-04-21 – 2017-04-22 (×4): 50 mg via INTRAVENOUS
  Filled 2017-04-21: qty 1
  Filled 2017-04-21: qty 2
  Filled 2017-04-21 (×3): qty 1

## 2017-04-21 MED ORDER — DARBEPOETIN ALFA 100 MCG/0.5ML IJ SOSY
PREFILLED_SYRINGE | INTRAMUSCULAR | Status: AC
  Administered 2017-04-21: 100 ug via INTRAVENOUS
  Filled 2017-04-21: qty 0.5

## 2017-04-21 MED ORDER — SODIUM CHLORIDE 0.9 % IV BOLUS (SEPSIS)
500.0000 mL | Freq: Once | INTRAVENOUS | Status: AC
  Administered 2017-04-21: 500 mL via INTRAVENOUS

## 2017-04-21 MED ORDER — CALCITRIOL 0.5 MCG PO CAPS
1.7500 ug | ORAL_CAPSULE | ORAL | Status: DC
  Administered 2017-04-21: 1.75 ug via ORAL
  Filled 2017-04-21: qty 1

## 2017-04-21 NOTE — Progress Notes (Signed)
Subjective: 1 Day Post-Op Procedure(s) (LRB): BILATERAL BELOW KNEE AMPUTATION (Bilateral) APPLICATION OF WOUND VAC, bilateral below knee amputation stumps (Bilateral) Patient reports pain as severe.   Appears comfortable.  Objective: Vital signs in last 24 hours: Temp:  [97.1 F (36.2 C)-99 F (37.2 C)] 99 F (37.2 C) (09/22 0600) Pulse Rate:  [68-99] 78 (09/22 0700) Resp:  [7-28] 9 (09/22 0700) BP: (66-114)/(38-83) 88/60 (09/22 0700) SpO2:  [85 %-100 %] 92 % (09/22 0700) Weight:  [143 lb 15.4 oz (65.3 kg)-153 lb (69.4 kg)] 143 lb 15.4 oz (65.3 kg) (09/21 1851)  Intake/Output from previous day: 09/21 0701 - 09/22 0700 In: 2050 [P.O.:400; I.V.:1650] Out: 100 [Blood:100] Intake/Output this shift: No intake/output data recorded.   Recent Labs  May 07, 2017 1224 05/07/17 1811  HGB 11.6* 10.2*    Recent Labs  2017/05/07 1224 05-07-2017 1811  WBC  --  28.5*  RBC  --  3.39*  HCT 34.0* 32.3*  PLT  --  146*    Recent Labs  05/07/2017 1224 2017/05/07 1811  NA 138 139  K 3.8 4.0  CL  --  97*  CO2  --  23  BUN  --  38*  CREATININE  --  6.94*  GLUCOSE 93 82  CALCIUM  --  10.3    Recent Labs  May 07, 2017 1811  INR 1.87   Awake and alert Incision: dressing C/D/I Compartment soft Wound vac in place bilateral legs   Assessment/Plan: 1 Day Post-Op Procedure(s) (LRB): BILATERAL BELOW KNEE AMPUTATION (Bilateral) APPLICATION OF WOUND VAC, bilateral below knee amputation stumps (Bilateral) Continue wound vacs bilateral Monitor WBC and chronic anemia ( no symptoms of anemia)    GILBERT CLARK 04/21/2017, 8:28 AM

## 2017-04-21 NOTE — Progress Notes (Signed)
PT Cancellation Note  Patient Details Name: Willie Andrade MRN: [id number] DOB: 1950/08/03   Cancelled Treatment:    Reason Eval/Treat Not Completed: Patient at procedure or test/unavailable, pt receiving dialysis currently. Will check back tomorrow as time allows.    [place] L Tamsin Nader 04/21/2017, 2:41 PM

## 2017-04-21 NOTE — Progress Notes (Signed)
Pt irritated and agitated at this RN, pt refusing to allow arms and bilateral bka, pt pt denies discomfort, this rn unable to perform full nursing assessment d/t pt refusal and yelling "help"

## 2017-04-21 NOTE — Progress Notes (Addendum)
[facility] Progress Note  Subjective: BP's still low, 80's- 90's on pressors. Pt awake and alert, no c/o.   Vitals:   04/21/17 0800 04/21/17 0847 04/21/17 0900 04/21/17 1000  BP: (!) 92/58  (!) 82/54 (!) 83/60  Pulse: 77   80  Resp: 14  (!) 8 (!) 9  Temp:  98.2 F (36.8 C)    TempSrc:  Oral    SpO2: 94%   (!) 85%  Weight:      Height:        Inpatient medications: . calcitRIOL  1.75 mcg Oral Q T,Th,Sa-HD  . darbepoetin (ARANESP) injection - DIALYSIS  100 mcg Intravenous Q Sat-HD  . docusate sodium  100 mg Oral BID  . hydrocortisone sodium succinate  50 mg Intravenous Q6H  . midodrine  10 mg Oral TID WC   . sodium chloride 75 mL/hr (04/02/2017 1900)  . methocarbamol (ROBAXIN)  IV    . phenylephrine (NEO-SYNEPHRINE) Adult infusion 25 mcg/min (04/21/17 0731)   acetaminophen **OR** acetaminophen, bisacodyl, magnesium citrate, methocarbamol **OR** methocarbamol (ROBAXIN)  IV, metoCLOPramide **OR** metoCLOPramide (REGLAN) injection, ondansetron **OR** ondansetron (ZOFRAN) IV, oxyCODONE, polyethylene glycol  Exam: General: Well developed, in no acute distress. Lungs: Clear bilaterally to auscultation anteriorly CV: RRR; 3/6 systolic murmur. Musculoskeletal:  Strength and tone appear normal for age. Lower extremities: Bilateral BKA in stump shrinkers with wound vacs in place. Dialysis Access: L forearm AVF + bruit/thrill  Dialysis: [facility] 3h   71kg   AVF LFA   Hep 1400 - Mircera IV q 2 weeks (last 9/4) - Calcitriol 1.50mcg PO q HD  Assessment: 1. Bilat BKA 9/21 (for gangrene) - Per ortho. Wound vacs in place. 2. ESRD: TTS, HD today 3. BP/volume: Hypotensive s/p surgery. Per primary team. On pressors 4. Anemia: Expect post-surgical Hgb drop. Will redose Aranesp tomorrow with HD. 5. Metabolic bone disease: Labs pending. Resume binders once eating. Resume VDRA 9/22. 6. Systolic HF: Per primary. 7. Vol - no vol excess on exam   Plan - HD  today in ICU, no sig UF   Vinson Moselle MD [facility] pager [phone]   04/21/2017, 12:01 PM    Recent Labs Lab 04/13/2017 1224 04/21/2017 1811  NA 138 139  K 3.8 4.0  CL  --  97*  CO2  --  23  GLUCOSE 93 82  BUN  --  38*  CREATININE  --  6.94*  CALCIUM  --  10.3   No results for input(s): AST, ALT, ALKPHOS, BILITOT, PROT, ALBUMIN in the last 168 hours.  Recent Labs Lab 04/19/2017 1224 04/26/2017 1811  WBC  --  28.5*  NEUTROABS  --  26.5*  HGB 11.6* 10.2*  HCT 34.0* 32.3*  MCV  --  95.3  PLT  --  146*   Iron/TIBC/Ferritin/ %Sat No results found for: IRON, TIBC, FERRITIN, IRONPCTSAT

## 2017-04-21 NOTE — Consult Note (Signed)
PULMONARY / CRITICAL CARE MEDICINE   Name: Willie Andrade MRN: [id number] DOB: 1951/01/11    ADMISSION DATE:  04/12/2017 CONSULTATION DATE:  9/21  REFERRING MD:  Dr. Lajoyce Corners  CHIEF COMPLAINT:  hypotension  HISTORY OF PRESENT ILLNESS:   66 year old male with PMH as below, which is significant for systolic CHF, ESRD on HD, hepatitis B, PVD, and severe protein calorie malnutrition. He is a current smoker. 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 Lajoyce Corners. Intraoperatively he required phenylephrine for BP support, and continued to need this in the post-operative setting. PCCM asked to eval for ICU management.   SUBJECTIVE:  Awake and alert. On neo drip  VITAL SIGNS: BP (!) 88/60   Pulse 78   Temp 98.2 F (36.8 C) (Oral)   Resp (!) 9   Ht 6\' 2"  (1.88 m)   Wt 143 lb 15.4 oz (65.3 kg)   SpO2 92%   BMI 18.48 kg/m   HEMODYNAMICS:    VENTILATOR SETTINGS:    INTAKE / OUTPUT: I/O last 3 completed shifts: In: 2050 [P.O.:400; I.V.:1650] Out: 100 [Blood:100]  PHYSICAL EXAMINATION: General:  Thin male in nad HEENT: No jvd/lan VQM:GQQP affect Neuro: Intact CV: HSR RRR PULM: CTA YP:PJKD, non-tender, bsx4 active , eating Extremities: bilateral bka with dressings intact Skin: no rashes or lesions .  LABS:  BMET  Recent Labs Lab 04/03/2017 1224 04/19/2017 1811  NA 138 139  K 3.8 4.0  CL  --  97*  CO2  --  23  BUN  --  38*  CREATININE  --  6.94*  GLUCOSE 93 82    Electrolytes  Recent Labs Lab 04/18/2017 1811  CALCIUM 10.3    CBC  Recent Labs Lab 04/16/2017 1224 04/22/2017 1811  WBC  --  28.5*  HGB 11.6* 10.2*  HCT 34.0* 32.3*  PLT  --  146*    Coag's  Recent Labs Lab 04/06/2017 1811  INR 1.87    Sepsis Markers  Recent Labs Lab 04/03/2017 1811 04/12/2017 2315  LATICACIDVEN 2.5* 2.5*    ABG No results for input(s): PHART, PCO2ART, PO2ART in the last 168 hours.  Liver Enzymes No results  for input(s): AST, ALT, ALKPHOS, BILITOT, ALBUMIN in the last 168 hours.  Cardiac Enzymes  Recent Labs Lab 04/13/2017 1811 04/14/2017 2315 04/21/17 0538  TROPONINI 0.83* 0.72* 0.61*    Glucose  Recent Labs Lab 04/21/2017 1457 04/07/2017 2008  GLUCAP 93 81    Imaging No results found.   STUDIES:    CULTURES:   ANTIBIOTICS: Ancef 9/21 periop  SIGNIFICANT EVENTS: 9/21 admit for bilateral BKA, hypotensive post-op  LINES/TUBES:   DISCUSSION: 66 year old male with complex medical history taken for bilateral BKA and remained hypotensive post-operatively. To ICU on phenylephrine. Remains on neo, will start midodrine 9/22.    ASSESSMENT / PLAN:  PULMONARY A: Tobacco abuse  P:   monitor  CARDIOVASCULAR A:  Hypotension/shock, likely hypovolemic vs medication related. Also some chronic hypotension. Chronic systolic CHF Atrial fibrillation Chronic hypotension (on midodrine, reported not taking)  P:  Telemetry monitoring 1L bolused, will continue gentle IVF, repeat with 500 cc 9/22 Resume midodrine when more awake and able to take PO, ordered 9/22 Currently off pressors with MAP>65, but remains borderline Will transfer to ICU for monitoring EKG Troponin 0.61 Lactic 2.5  RENAL A:   ESRD on HD  P:   Nephrology following.  Repeat BMP  GASTROINTESTINAL A:  No acute issues  P:   On renal diet protonix  HEMATOLOGIC  Recent Labs  2017/04/27 1224 04-27-2017 1811  HGB 11.6* 10.2*    A:   Anemia of chronic illness- additionally, EBL during surgery 200 cc  P:  Repeat CBC post op Coags Transfuse for Hgb < 7  INFECTIOUS A:   No acute issues  P:   PPX ABX per ortho  ENDOCRINE A:   No acute issue    P:   Follow BMP for glucose  NEUROLOGIC A:   Acute encephalopathy secondary to pain medication.  9/22 awake and alert P:   RASS goal: 0 Conservative use of narcotic medications.     FAMILY  - Updates: patient updated in PACU. 9/22 pt  updated  - Inter-disciplinary family meet or Palliative Care meeting due by:  9/28  Brett Canales Minor ACNP Ad[facility] Pollack PCCM Pager [phone] till 3 pm If no answer page [phone] 04/21/2017, 10:06 AM   STAFF NOTE: I, Rory Percy, MD FACP have personally reviewed patient's available data, including medical history, events of note, physical examination and test results as part of my evaluation. I have discussed with resident/NP and other care providers such as pharmacist, RN and RRT. In addition, I personally evaluated patient and elicited key findings of: awake, alert, no distress, JVD wnl, lungs clear, abdo soft, left dressed BLA, pcxr none today, in setting shock would assess pcxr for volume status, neo to map goals, consider LOWER mAP goal 55 with good MS, on mitodrine, assess cortisol, then add empiric roids while we wait, no role florinef, I am concerned about stress ischemia, assess echo, reluctant to use heparin with recent post op, follow trop trend, allow pos balance, caution dilauded, use fentanly if needed, allow pos balance, I udpated pt in full The patient is critically ill with multiple organ systems failure and requires high complexity decision making for assessment and support, frequent evaluation and titration of therapies, application of advanced monitoring technologies and extensive interpretation of multiple databases.   Critical Care Time devoted to patient care services described in this note is 30 Minutes. This time reflects time of care of this signee: Rory Percy, MD FACP. This critical care time does not reflect procedure time, or teaching time or supervisory time of PA/NP/Med student/Med Resident etc but could involve care discussion time. Rest per NP/medical resident whose note is outlined above and that I agree with   Mcarthur Rossetti. Tyson Alias, MD, FACP Pgr: [phone] [facility] 04/21/2017 11:04 AM

## 2017-04-21 NOTE — Progress Notes (Signed)
Phlebotomy at bedside pt refusing lab draw explanation for lab draw given to pt, pt oriented to name, continues to refuse lab draw commenting "I did not have a heart attack".

## 2017-04-22 ENCOUNTER — Inpatient Hospital Stay (HOSPITAL_COMMUNITY): Payer: Medicare Other

## 2017-04-22 DIAGNOSIS — I361 Nonrheumatic tricuspid (valve) insufficiency: Secondary | ICD-10-CM

## 2017-04-22 LAB — BASIC METABOLIC PANEL
Anion gap: 16 — ABNORMAL HIGH (ref 5–15)
BUN: 33 mg/dL — AB (ref 6–20)
CO2: 22 mmol/L (ref 22–32)
CREATININE: 5.45 mg/dL — AB (ref 0.61–1.24)
Calcium: 10 mg/dL (ref 8.9–10.3)
Chloride: 100 mmol/L — ABNORMAL LOW (ref 101–111)
GFR calc Af Amer: 11 mL/min — ABNORMAL LOW (ref >60)
GFR, EST NON AFRICAN AMERICAN: 10 mL/min — AB (ref >60)
Glucose, Bld: 99 mg/dL (ref 65–99)
POTASSIUM: 4.3 mmol/L (ref 3.5–5.1)
SODIUM: 138 mmol/L (ref 135–145)

## 2017-04-22 LAB — ECHOCARDIOGRAM COMPLETE
AOASC: 33 cm
CHL CUP MV DEC (S): 148
CHL CUP TV REG PEAK VELOCITY: 357 cm/s
EWDT: 148 ms
FS: 30 % (ref 28–44)
HEIGHTINCHES: 74 in
IVS/LV PW RATIO, ED: 0.87
LA diam end sys: 41 mm
LA diam index: 2.22 cm/m2
LA vol: 76.5 mL
LASIZE: 41 mm
LAVOLA4C: 68.4 mL
LAVOLIN: 41.4 mL/m2
LVOT area: 3.46 cm2
LVOT diameter: 21 mm
MV Peak grad: 4 mmHg
MVPKAVEL: 73.7 m/s
MVPKEVEL: 96 m/s
PW: 10.3 mm — AB (ref 0.6–1.1)
TAPSE: 25.9 mm
TR max vel: 357 cm/s
Weight: 2349.22 oz

## 2017-04-22 LAB — HEPATITIS B SURFACE ANTIGEN: HEP B S AG: NEGATIVE

## 2017-04-22 LAB — CBC
HCT: 33.7 % — ABNORMAL LOW (ref 39.0–52.0)
HEMOGLOBIN: 10.3 g/dL — AB (ref 13.0–17.0)
MCH: 29 pg (ref 26.0–34.0)
MCHC: 30.6 g/dL (ref 30.0–36.0)
MCV: 94.9 fL (ref 78.0–100.0)
PLATELETS: 148 10*3/uL — AB (ref 150–400)
RBC: 3.55 MIL/uL — AB (ref 4.22–5.81)
RDW: 21.7 % — ABNORMAL HIGH (ref 11.5–15.5)
WBC: 23.9 10*3/uL — AB (ref 4.0–10.5)

## 2017-04-22 LAB — TROPONIN I: TROPONIN I: 0.34 ng/mL — AB (ref <0.03)

## 2017-04-22 MED ORDER — HEPARIN SODIUM (PORCINE) 5000 UNIT/ML IJ SOLN
5000.0000 [IU] | Freq: Three times a day (TID) | INTRAMUSCULAR | Status: DC
  Administered 2017-04-22 – 2017-04-23 (×5): 5000 [IU] via SUBCUTANEOUS
  Filled 2017-04-22 (×6): qty 1

## 2017-04-22 MED ORDER — CALCITRIOL 0.5 MCG PO CAPS: 0.5000 ug | ORAL_CAPSULE | ORAL | Status: DC

## 2017-04-22 MED ORDER — LORAZEPAM 0.5 MG PO TABS: 0.5000 mg | ORAL_TABLET | Freq: Four times a day (QID) | ORAL | Status: DC | PRN

## 2017-04-22 MED ORDER — LORAZEPAM 2 MG/ML IJ SOLN
0.5000 mg | Freq: Four times a day (QID) | INTRAMUSCULAR | Status: DC | PRN
  Administered 2017-04-22 – 2017-04-23 (×2): 0.5 mg via INTRAVENOUS
  Filled 2017-04-22 (×2): qty 1

## 2017-04-22 MED ORDER — ASPIRIN 81 MG PO CHEW
81.0000 mg | CHEWABLE_TABLET | Freq: Every day | ORAL | Status: DC
  Administered 2017-04-22: 81 mg via ORAL
  Filled 2017-04-22 (×2): qty 1

## 2017-04-22 NOTE — Progress Notes (Signed)
  Echocardiogram 2D Echocardiogram has been performed.  Willie Andrade 04/22/2017, 11:38 AM

## 2017-04-22 NOTE — Progress Notes (Signed)
 [facility] Progress Note  Subjective: complains about food, wants more to eat than liquids. Off pressors, had HD yest , no UF  Vitals:   04/22/17 0530 04/22/17 0600 04/22/17 0630 04/22/17 0700  BP: (!) 91/50 (!) 87/55 (!) 85/52   Pulse:  81    Resp: 15 12 12    Temp:    98.9 F (37.2 C)  TempSrc:    Oral  SpO2:  93%    Weight:      Height:        Inpatient medications: . calcitRIOL  1.75 mcg Oral Q T,Th,Sa-HD  . darbepoetin (ARANESP) injection - DIALYSIS  100 mcg Intravenous Q Sat-HD  . docusate sodium  100 mg Oral BID  . hydrocortisone sodium succinate  50 mg Intravenous Q6H  . midodrine  10 mg Oral TID WC   . sodium chloride    . sodium chloride    . methocarbamol (ROBAXIN)  IV    . phenylephrine (NEO-SYNEPHRINE) Adult infusion Stopped (04/21/17 2018)   sodium chloride, sodium chloride, acetaminophen **OR** acetaminophen, bisacodyl, lidocaine (PF), magnesium citrate, methocarbamol **OR** methocarbamol (ROBAXIN)  IV, metoCLOPramide **OR** metoCLOPramide (REGLAN) injection, ondansetron **OR** ondansetron (ZOFRAN) IV, oxyCODONE, pentafluoroprop-tetrafluoroeth, polyethylene glycol  Exam: General: Well developed, in no acute distress. Lungs: Clear bilaterally to auscultation anteriorly CV: RRR; 3/6 systolic murmur. Musculoskeletal:  Strength and tone appear normal for age. Lower extremities: Bilateral BKA in stump shrinkers with wound vacs in place. Dialysis Access: L forearm AVF + bruit/thrill  Dialysis: [facility]h   71kg   AVF LFA   Hep 1400 - Mircera IV q 2 weeks (last 9/4) - Calcitriol 1.42mcg PO q HD  Assessment: 1. Bilat BKA 9/21 (for gangrene) - per ortho 2. ESRD: TTS HD. Had HD yest w/o complication 3. Hypotension: resolved, off pressors 4. Anemia of CKD: no sig postop drop, stable Hb 100, got darbe 100 ug on 9/22 saturday 5. Metabolic bone disease: Ca is high, will decrease calcitriol to 0.5 ug tiw. 6. Systolic HF: Per  primary. 7. Volume: is 5kg below dry wt , as expected with bilat leg amp, euvolemic   Plan - no new suggestions, will follow   Vinson Moselle MD [facility] pager [phone]   04/22/2017, 8:59 AM    Recent Labs Lab 05/06/2017 1811 04/21/17 1500 04/22/17 0253  NA 139 138 138  K 4.0 4.4 4.3  CL 97* 99* 100*  CO2 23 24 22   GLUCOSE 82 74 99  BUN 38* 45* 33*  CREATININE 6.94* 7.24* 5.45*  CALCIUM 10.3 10.0 10.0  PHOS  --  9.3*  --     Recent Labs Lab 04/21/17 1500  ALBUMIN 1.9*    Recent Labs Lab May 06, 2017 1811 04/21/17 1448 04/22/17 0253  WBC 28.5* 17.3* 23.9*  NEUTROABS 26.5*  --   --   HGB 10.2* 9.1* 10.3*  HCT 32.3* 28.9* 33.7*  MCV 95.3 93.8 94.9  PLT 146* 141* 148*   Iron/TIBC/Ferritin/ %Sat No results found for: IRON, TIBC, FERRITIN, IRONPCTSAT

## 2017-04-22 NOTE — Progress Notes (Signed)
Pt has telemetry bed available, called 2W and gave report to [employer]. Transferred in a bed, pt is alert confused and talking. Poor oral intake, been refusing most of his meals and drinks. Family visiting at bed side. No other concerns.   Willie Andrade

## 2017-04-22 NOTE — Progress Notes (Signed)
Pt arrived in bed from 70M accompanied by RN, alert and oriented x1, RA. Telemetry box 29, CCMD notified. Noted bilateral BKA with wound vac in place running continuously at . IV R FA NSL.Call bell and personal belongings within reach. Bed alarm on. Orders have been reviewed and implemented. Will continue to monitor.

## 2017-04-22 NOTE — Progress Notes (Signed)
PULMONARY / CRITICAL CARE MEDICINE   Name: Willie Andrade MRN: [id number] DOB: [birth year]    ADMISSION DATE:  04/04/2017 CONSULTATION DATE:  9/21  REFERRING MD:  Dr. Lajoyce Corners  CHIEF COMPLAINT:  hypotension  HISTORY OF PRESENT ILLNESS:   66 year old male with PMH as below, which is significant for systolic CHF, ESRD on HD, hepatitis B, PVD, and severe protein calorie malnutrition. He is a current smoker. 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 Lajoyce Corners. Intraoperatively he required phenylephrine for BP support, and continued to need this in the post-operative setting. PCCM asked to eval for ICU management.   SUBJECTIVE:  Awake and alert. Off pressors  VITAL SIGNS: BP (!) 85/52   Pulse 81   Temp 98.9 F (37.2 C) (Oral)   Resp 12   Ht 6\' 2"  (1.88 m)   Wt 146 lb 13.2 oz (66.6 kg)   SpO2 93%   BMI 18.85 kg/m   HEMODYNAMICS:    VENTILATOR SETTINGS:    INTAKE / OUTPUT: I/O last 3 completed shifts: In: 3226.3 [P.O.:400; I.V.:2826.3] Out: 0   PHYSICAL EXAMINATION: General:  Frail male in NAD HEENT: No jvd/lan LGX:QJJH affect Neuro: intact  CV: hsr rrr PULM: cta ER:DEYC, non-tender, bsx4 active  Extremities: warm/dry, bilat bka dressing intact  Skin: no rashes or lesions   LABS:  BMET  Recent Labs Lab 04/05/2017 1811 04/21/17 1500 04/22/17 0253  NA 139 138 138  K 4.0 4.4 4.3  CL 97* 99* 100*  CO2 23 24 22   BUN 38* 45* 33*  CREATININE 6.94* 7.24* 5.45*  GLUCOSE 82 74 99    Electrolytes  Recent Labs Lab 04/26/2017 1811 04/21/17 1500 04/22/17 0253  CALCIUM 10.3 10.0 10.0  PHOS  --  9.3*  --     CBC  Recent Labs Lab 04/22/2017 1811 04/21/17 1448 04/22/17 0253  WBC 28.5* 17.3* 23.9*  HGB 10.2* 9.1* 10.3*  HCT 32.3* 28.9* 33.7*  PLT 146* 141* 148*    Coag's  Recent Labs Lab 04/25/2017 1811  INR 1.87    Sepsis Markers  Recent Labs Lab 04/14/2017 1811 04/08/2017 2315  04/21/17 1147  LATICACIDVEN 2.5* 2.5* 1.7    ABG No results for input(s): PHART, PCO2ART, PO2ART in the last 168 hours.  Liver Enzymes  Recent Labs Lab 04/21/17 1500  ALBUMIN 1.9*    Cardiac Enzymes  Recent Labs Lab 04/21/17 0538 04/21/17 1147 04/22/17 0356  TROPONINI 0.61* 0.58* 0.34*    Glucose  Recent Labs Lab 04/14/2017 1457 04/19/2017 2008  GLUCAP 93 81    Imaging Dg Chest Port 1 View  Result Date: 04/21/2017 CLINICAL DATA:  Shock. EXAM: PORTABLE CHEST 1 VIEW COMPARISON:  March 05, 2017 FINDINGS: Stable 3 lead AICD device. Cardiomegaly. The hila and mediastinum are normal. No pneumothorax. No pulmonary nodules or masses. No focal infiltrates. Mild haziness over the right base could represent a tiny layering effusion and atelectasis. IMPRESSION: Mild haziness over the right base could represent a tiny layering effusion and atelectasis. No other acute abnormalities. Electronically Signed   By: Gerome Sam III M.D   On: 04/21/2017 11:54     STUDIES:    CULTURES:   ANTIBIOTICS: Ancef 9/21 periop  SIGNIFICANT EVENTS: 9/21 admit for bilateral BKA, hypotensive post-op  LINES/TUBES:   DISCUSSION: 66 year old male with complex medical history taken for bilateral BKA and remained hypotensive post-operatively. To ICU on phenylephrine. Remains on neo, will start midodrine 9/22.  Remains stable. His bp is normally in 80's systolic. Now on solucortef which can weaned off ASSESSMENT / PLAN:  PULMONARY A: Tobacco abuse  P:   monitor  CARDIOVASCULAR A:  Hypotension/shock, likely hypovolemic vs medication related. Also some chronic hypotension. Resolved Chronic systolic CHF Atrial fibrillation Chronic hypotension (on midodrine, reported not taking)  P:  Telemetry monitoring 1L bolused, will continue gentle IVF, repeat with 500 cc 9/22 Resume midodrine when more awake and able to take PO, ordered 9/22 Currently off pressors with MAP>65, but remains  borderline Echo pending Troponin 0.61 Lactic 2.5->1.5 Stress steroids started 9/22 , start weaning to off RENAL A:   ESRD on HD  P:   Nephrology following.  Follow BMP  GASTROINTESTINAL A:   No acute issues  P:   On renal diet protonix  HEMATOLOGIC  Recent Labs  04/21/17 1448 04/22/17 0253  HGB 9.1* 10.3*    A:   Anemia of chronic illness- additionally, EBL during surgery 200 cc  P:  Repeat CBC post op Coags Transfuse for Hgb < 7  INFECTIOUS A:   No acute issues  P:   PPX ABX per ortho  ENDOCRINE ? Adrenal insuff Cortisol 26 A:   No acute issue    P:   Follow BMP for glucose  NEUROLOGIC A:   Acute encephalopathy secondary to pain medication.  9/22 awake and alert P:   RASS goal: 0 Conservative use of narcotic medications.     FAMILY  - Updates: patient updated in PACU. 9/22 pt updated  - Inter-disciplinary family meet or Palliative Care meeting due by:  9/28 May go out of ICU and PCCM will turn over medical management to [facility] as of 9/24  Brett Canales Minor ACNP [facility] Pollack PCCM Pager [phone] till 3 pm If no answer page [phone] 04/22/2017, 10:16 AM   STAFF NOTE: I, Rory Percy, MD FACP have personally reviewed patient's available data, including medical history, events of note, physical examination and test results as part of my evaluation. I have discussed with resident/NP and other care providers such as pharmacist, RN and RRT. In addition, I personally evaluated patient and elicited key findings of: awake, alert,  jvd wnl, lungs without crackles, abdo soft, dressed lowers post op, pcxr I reviewed shows mild edema, trop rise noted in setting renal, will need tele and cards assessment pre dc, consider asa, echo now assess wall motion, for HD likely in am   Mcarthur Rossetti. Tyson Alias, MD, FACP Pgr: [phone] [facility] 04/22/2017 11:24 AM

## 2017-04-22 NOTE — Evaluation (Signed)
Physical Therapy Evaluation Patient Details Name: Willie Andrade MRN: [id number] DOB: Jan 03, 1951 Today's Date: 04/22/2017   History of Present Illness  66 year old male with PMH as below, which is significant for systolic CHF, ESRD on HD, hepatitis B, PVD, and severe protein calorie malnutrition. He is a current smoker. 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 Lajoyce Corners. Intraoperatively he required phenylephrine for BP support, and continued to need this in the post-operative setting.  Clinical Impression   Patient is s/p above surgery resulting in functional limitations due to the deficits listed below (see PT Problem List). Prior to admission, able to walk household distances with RW; Now presents with dependencies in mobility s/p bil BKAs;  Patient will benefit from skilled PT to increase their independence and safety with mobility to allow discharge to the venue listed below.       Follow Up Recommendations SNF    Equipment Recommendations  Wheelchair (measurements PT);Wheelchair cushion (measurements PT)    Recommendations for Other Services       Precautions / Restrictions Precautions Precautions: Fall Restrictions RLE Weight Bearing: Non weight bearing LLE Weight Bearing: Non weight bearing      Mobility  Bed Mobility Overal bed mobility: Needs Assistance Bed Mobility: Supine to Sit;Sit to Supine;Rolling Rolling: Mod assist   Supine to sit: Mod assist Sit to supine: Mod assist   General bed mobility comments: Heavy mod assist to pull to sit, elevate trunk to sit; Needing cues, including hand-over-hand to reach for rail, with all bed mobility  Transfers                 General transfer comment: Pt declining OOB  Ambulation/Gait                Stairs            Wheelchair Mobility    Modified Rankin (Stroke Patients Only)       Balance Overall balance assessment: Needs  assistance Sitting-balance support: Bilateral upper extremity supported Sitting balance-Leahy Scale: Poor Sitting balance - Comments: Requires min to mod assist for support and sitting balance                                     Pertinent Vitals/Pain Pain Assessment: Faces Faces Pain Scale: Hurts even more Pain Location: Bil LEs with movement; pt also reported some phantom sensation Pain Descriptors / Indicators: Grimacing;Guarding Pain Intervention(s): Monitored during session;Premedicated before session    Home Living Family/patient expects to be discharged to:: Skilled nursing facility                 Additional Comments: Lives with sister and grandson    Prior Function Level of Independence: Independent with assistive device(s)         Comments: takes SCAT to HD; walked with RW after last admission     Hand Dominance        Extremity/Trunk Assessment   Upper Extremity Assessment Upper Extremity Assessment: Generalized weakness    Lower Extremity Assessment Lower Extremity Assessment: RLE deficits/detail;LLE deficits/detail RLE Deficits / Details: New BKA; painful with movement, but able to fully extend knee in supine; more difficulty with extending knee in upright sitting position LLE Deficits / Details: New BKA; painful with movement, but able to fully extend knee in supine; more difficulty with extending knee in upright sitting position  Communication   Communication: No difficulties  Cognition Arousal/Alertness: Awake/alert Behavior During Therapy: Restless Overall Cognitive Status: Impaired/Different from baseline Area of Impairment: Attention;Following commands;Safety/judgement                   Current Attention Level: Sustained (easily distractible)   Following Commands: Follows one step commands inconsistently;Follows one step commands with increased time Safety/Judgement: Decreased awareness of safety;Decreased  awareness of deficits     General Comments: near perseverating on applesauce on his tray most of session      General Comments General comments (skin integrity, edema, etc.): VSS throughout session; BPs soft, but no significant change between supine BP and sitting BP    Exercises     Assessment/Plan    PT Assessment Patient needs continued PT services  PT Problem List Decreased strength;Decreased range of motion;Decreased activity tolerance;Decreased balance;Decreased mobility;Decreased coordination;Decreased cognition;Decreased knowledge of use of DME;Decreased safety awareness;Decreased knowledge of precautions       PT Treatment Interventions DME instruction;Functional mobility training;Therapeutic activities;Therapeutic exercise;Balance training;Neuromuscular re-education;Cognitive remediation;Patient/family education;Wheelchair mobility training    PT Goals (Current goals can be found in the Care Plan section)  Acute Rehab PT Goals Patient Stated Goal: Did not state PT Goal Formulation: Patient unable to participate in goal setting Time For Goal Achievement: 05/06/17 Potential to Achieve Goals: Fair    Frequency Min 2X/week   Barriers to discharge        Co-evaluation               AM-PAC PT "6 Clicks" Daily Activity  Outcome Measure Difficulty turning over in bed (including adjusting bedclothes, sheets and blankets)?: Unable Difficulty moving from lying on back to sitting on the side of the bed? : Unable Difficulty sitting down on and standing up from a chair with arms (e.g., wheelchair, bedside commode, etc,.)?: Unable Help needed moving to and from a bed to chair (including a wheelchair)?: Total Help needed walking in hospital room?: Total Help needed climbing 3-5 steps with a railing? : Total 6 Click Score: 6    End of Session Equipment Utilized During Treatment:  (bed pad) Activity Tolerance: Patient tolerated treatment well Patient left: in bed;with  call bell/phone within reach;with bed alarm set Nurse Communication: Mobility status PT Visit Diagnosis: Muscle weakness (generalized) (M62.81);Other abnormalities of gait and mobility (R26.89)    Time: 5670-1410 PT Time Calculation (min) (ACUTE ONLY): 21 min   Charges:   PT Evaluation $PT Eval Moderate Complexity: 1 Mod     PT G Codes:        Van Clines, PT  Acute Rehabilitation Services Pager [phone] Office [phone]   Levi Aland 04/22/2017, 2:24 PM

## 2017-04-23 ENCOUNTER — Encounter: Payer: Medicare Other | Admitting: Cardiology

## 2017-04-23 DIAGNOSIS — G9340 Encephalopathy, unspecified: Secondary | ICD-10-CM

## 2017-04-23 DIAGNOSIS — Z89512 Acquired absence of left leg below knee: Secondary | ICD-10-CM

## 2017-04-23 DIAGNOSIS — I9581 Postprocedural hypotension: Secondary | ICD-10-CM

## 2017-04-23 DIAGNOSIS — R748 Abnormal levels of other serum enzymes: Secondary | ICD-10-CM

## 2017-04-23 DIAGNOSIS — I5022 Chronic systolic (congestive) heart failure: Secondary | ICD-10-CM

## 2017-04-23 DIAGNOSIS — Z89511 Acquired absence of right leg below knee: Secondary | ICD-10-CM

## 2017-04-23 DIAGNOSIS — I959 Hypotension, unspecified: Secondary | ICD-10-CM

## 2017-04-23 LAB — CBC
HCT: 33.3 % — ABNORMAL LOW (ref 39.0–52.0)
HEMOGLOBIN: 10.1 g/dL — AB (ref 13.0–17.0)
MCH: 28.9 pg (ref 26.0–34.0)
MCHC: 30.3 g/dL (ref 30.0–36.0)
MCV: 95.1 fL (ref 78.0–100.0)
Platelets: 132 10*3/uL — ABNORMAL LOW (ref 150–400)
RBC: 3.5 MIL/uL — AB (ref 4.22–5.81)
RDW: 21.7 % — ABNORMAL HIGH (ref 11.5–15.5)
WBC: 17.3 10*3/uL — ABNORMAL HIGH (ref 4.0–10.5)

## 2017-04-23 LAB — BLOOD GAS, VENOUS
ACID-BASE EXCESS: 0.1 mmol/L (ref 0.0–2.0)
Bicarbonate: 23.7 mmol/L (ref 20.0–28.0)
Drawn by: 274071
FIO2: 0.21
O2 Saturation: 53.3 %
Patient temperature: 98.6
pCO2, Ven: 34.6 mmHg — ABNORMAL LOW (ref 44.0–60.0)
pH, Ven: 7.449 — ABNORMAL HIGH (ref 7.250–7.430)
pO2, Ven: 33 mmHg (ref 32.0–45.0)

## 2017-04-23 LAB — AMMONIA: AMMONIA: 37 umol/L — AB (ref 9–35)

## 2017-04-23 LAB — PROTIME-INR
INR: 1.83
Prothrombin Time: 21 seconds — ABNORMAL HIGH (ref 11.4–15.2)

## 2017-04-23 LAB — BASIC METABOLIC PANEL
Anion gap: 19 — ABNORMAL HIGH (ref 5–15)
BUN: 44 mg/dL — AB (ref 6–20)
CHLORIDE: 100 mmol/L — AB (ref 101–111)
CO2: 21 mmol/L — AB (ref 22–32)
Calcium: 10.5 mg/dL — ABNORMAL HIGH (ref 8.9–10.3)
Creatinine, Ser: 6.24 mg/dL — ABNORMAL HIGH (ref 0.61–1.24)
GFR calc Af Amer: 10 mL/min — ABNORMAL LOW (ref >60)
GFR calc non Af Amer: 8 mL/min — ABNORMAL LOW (ref >60)
GLUCOSE: 74 mg/dL (ref 65–99)
POTASSIUM: 4.5 mmol/L (ref 3.5–5.1)
Sodium: 140 mmol/L (ref 135–145)

## 2017-04-23 LAB — APTT: aPTT: 38 seconds — ABNORMAL HIGH (ref 24–36)

## 2017-04-23 MED ORDER — CINACALCET HCL 30 MG PO TABS: 180.0000 mg | ORAL_TABLET | ORAL | Status: DC

## 2017-04-23 MED ORDER — NEPRO/CARBSTEADY PO LIQD
237.0000 mL | Freq: Two times a day (BID) | ORAL | Status: DC
  Filled 2017-04-23 (×4): qty 237

## 2017-04-23 MED ORDER — CALCITRIOL 0.25 MCG PO CAPS: 1.7500 ug | ORAL_CAPSULE | ORAL | Status: DC

## 2017-04-23 MED ORDER — CHLORHEXIDINE GLUCONATE CLOTH 2 % EX PADS: 6.0000 | MEDICATED_PAD | Freq: Every day | CUTANEOUS | Status: DC

## 2017-04-23 MED ORDER — PRO-STAT SUGAR FREE PO LIQD: 30.0000 mL | Freq: Two times a day (BID) | ORAL | Status: DC

## 2017-04-23 MED ORDER — RENA-VITE PO TABS: 1.0000 | ORAL_TABLET | Freq: Every day | ORAL | Status: DC

## 2017-04-23 MED ORDER — MUPIROCIN 2 % EX OINT
1.0000 "application " | TOPICAL_OINTMENT | Freq: Two times a day (BID) | CUTANEOUS | Status: DC
  Administered 2017-04-23 (×2): 1 via NASAL
  Filled 2017-04-23: qty 22

## 2017-04-23 MED ORDER — CALCITRIOL 0.5 MCG PO CAPS: 1.7500 ug | ORAL_CAPSULE | ORAL | Status: DC

## 2017-04-23 MED ORDER — SEVELAMER CARBONATE 800 MG PO TABS
3200.0000 mg | ORAL_TABLET | Freq: Three times a day (TID) | ORAL | Status: DC
  Filled 2017-04-23: qty 4

## 2017-04-23 NOTE — Assessment & Plan Note (Signed)
This is multifactorial and recently probably precipitated by ativan in setting of esrd, malnutitrion and ef 25%. Agree with dc ativan. He has hypoacive delirium and is protecting airway. Agree with check abg. Monitor. No indication right now for ICU transfer  PCCM avail as needed

## 2017-04-23 NOTE — Progress Notes (Signed)
Pt opens eyes to voice but no verbal communication. Pt is unable to swallow his night PO medications.

## 2017-04-23 NOTE — Progress Notes (Signed)
Initial Nutrition Assessment  DOCUMENTATION CODES:   Severe malnutrition in context of chronic illness  INTERVENTION:   -Continue Nepro Shake po BID, each supplement provides 425 kcal and 19 grams protein  -Add Pro-Stat 30 mL BID (100 kcals, 15 g of protein per 30 mL)  -Continue Rena-Vit  NUTRITION DIAGNOSIS:   Malnutrition (Severe) related to chronic illness (ESRD on HD, chronic wound, CHF) as evidenced by severe depletion of body fat, severe depletion of muscle mass.  GOAL:   Patient will meet greater than or equal to 90% of their needs  MONITOR:   Supplement acceptance, PO intake, Labs, Weight trends, Skin  REASON FOR ASSESSMENT:   Malnutrition Screening Tool    ASSESSMENT:    66 yo male admitted with gangrene of bilateral LE. Pt with hx of ESRD on HD, HTN, CHF, PVD, hepatitis B, HLD   9/21 S/p transtibial amputation bilateral LE with application of wound VAC  Pt lethargic on visit this AM, received ativan earlier for agitation. Currently wearing mittens. Unable to eat breakfast this AM due to poor mentation (not alert enough)  Noted per nsg notes, pt refusing most of meals/drinks yesterday  EDW 71 kg (pre-amputation), now below dry weight. Dry weight to be adjusted post amputations. Current wt 66.7 kg  Nutrition-Focused physical exam completed. Findings are mild/moderate to severefat depletion, moderate to severe muscle depletion, and mild edema.   Labs: phosphorus 9.3, corrected calcium 12.2, albumin 1.9, Creatinine 6.24 Meds: sensipar, calcitriol, Rena-Vit, renvela  Diet Order:  Diet renal with fluid restriction Fluid restriction: 1200 mL Fluid; Room service appropriate? Yes; Fluid consistency: Thin  Skin:  Wound (see comment) (wounds in groin, bilateral transtibial amputation this adm)  Last BM:  9/21  Height:   Ht Readings from Last 1 Encounters:  04/10/2017 6\' 2"  (1.88 m)    Weight:   Wt Readings from Last 1 Encounters:  04/22/17 147 lb (66.7 kg)     Ideal Body Weight:  75.9 kg  BMI:  Body mass index is 18.87 kg/m.  Estimated Nutritional Needs:   Kcal:  2010-2345 kcals  Protein:  101-134 g  Fluid:  1000 mL plus UOP  EDUCATION NEEDS:   Education needs no appropriate at this time  Romelle Starcher MS, RD, LDN [phone] Pager  [phone] Weekend/On-Call Pager

## 2017-04-23 NOTE — Plan of Care (Signed)
Problem: Safety: Goal: Ability to remain free from injury will improve Outcome: Not Progressing Pt continues to pull at mittens. Will continue to reorient.

## 2017-04-23 NOTE — Progress Notes (Signed)
Pt alert to voice. Not alert enough to swallow medication. BP 86/46. MD made aware. 100% on RA and HR 87

## 2017-04-23 NOTE — Assessment & Plan Note (Signed)
With esrd, ef 20% and hix of hypotension - I think sbp 80s with MAP > 55 is chronic baseline. This is where he is now. I do not think he needs resus for this bp. Goal set as sbp > 80 with MAP > 55 though even > 50 might be acceptable

## 2017-04-23 NOTE — Progress Notes (Signed)
[facility]  Patient Status: Active (receiving services up to time of hospitalization)  [facility] is providing the following services: RN  If patient discharges after hours, please call [phone].   Willie Andrade 04/23/2017, 10:22 AM

## 2017-04-23 NOTE — Progress Notes (Signed)
OT Cancellation Note  Patient Details Name: Willie Andrade MRN: [id number] DOB: 05/03/1951   Cancelled Treatment:    Reason Eval/Treat Not Completed: Fatigue/lethargy limiting ability to participate. Pt's RN reports that pt has been sleeping heavily all day and unable to eat meals or take meds  Galen Manila 04/23/2017, 1:29 PM

## 2017-04-23 NOTE — Progress Notes (Signed)
Patient ID: Willie Andrade, male   DOB: Sep 02, 1950, 67 y.o.   MRN: [id number] Patient resting comfortably this morning no agitation. Wound vacs are functioning well. Will plan for discharge to skilled nursing facility.

## 2017-04-23 NOTE — Progress Notes (Signed)
[facility] Progress Note   Dialysis Orders: TTS at [facility] 3:45h, BFR 400, DFR 800, EDW 71kg, 2K/2Ca, AVF, Heparin 1400 bolus - Mircera IV q 2 weeks (last 9/4) - Calcitriol 1.38mcg PO q HD Previously on sensipar 180 TIW  in July which really helped P but then apparently stopped when corr Ca wa 8.1 or when he was hospitalized   Assessment/Plan: 1. 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 ESRD - TTS - HD tomorrow K 4.5; prior history of noncompliance with HD --mostly staying on treatments 3. Anemia - Hgb 10.1- Hgb stable ; Aranesp 100 given 9/22 4. Secondary hyperparathyroidism - Ca 10.5 -last P high ( 9/22)- outpatient and inpatient Ca high OFF sensipar with HD - plan resume sensipar, 4 renvela (prior outpatient binder) and resume calcitriol Thursday after Ca comes down some; most recent ipTH was in the 900s 5. BP/volume - BP low; kept even on HD Saturday post wt 66.5 - down 4.5 from prior EDW; on midodrine for BP support; EF 20 - 25% 6. Nutrition- added renavites/nepro- intake very poor alb 2.7 7. + Tropinin 0.34 -? Stress in renal patient 9. Thrombocytopenia - mild - follow 8. Disp - for d/c to SNF  Sheffield Slider, PA-C [facility]per [phone] 04/23/2017,9:07 AM  LOS: 3 days   Subjective:  " Leave me alone"  Objective Vitals:   04/22/17 1600 04/22/17 1730 04/22/17 2141 04/23/17 0823  BP: (!) 85/47 (!) 88/56 (!) 94/50 (!) 100/56  Pulse: 84 80 86 82  Resp: (!) Temp:  98.5 F (36.9 C) 98 F (36.7 C) 98.1 F (36.7 C)  TempSrc:  Oral Oral Oral  SpO2: (!) 86% 92% 96% 96%  Weight:   66.7 kg (147 lb)   Height:       Physical Exam General: restless, confused no answering questions Heart: RRR 3/6 murmur Lungs: grossly clear Abdomen: soft NT Extremities:bilateral BKA with socks over VAC no overt edema - right hand in mitten Dialysis Access: left lower AVF + bruit  best distally   Additional Objective Labs: Basic Metabolic Panel:  Recent Labs Lab 04/21/17 1500 04/22/17 0253 04/23/17 0219  NA 138 138 140  K 4.4 4.3 4.5  CL 99* 100* 100*  CO2 24 22 21*  GLUCOSE 74 99 74  BUN 45* 33* 44*  CREATININE 7.24* 5.45* 6.24*  CALCIUM 10.0 10.0 10.5*  PHOS 9.3*  --   --    Liver Function Tests:  Recent Labs Lab 04/21/17 1500  ALBUMIN 1.9*   CBC:  Recent Labs Lab 04/25/2017 1811 04/21/17 1448 04/22/17 0253 04/23/17 0219  WBC 28.5* 17.3* 23.9* 17.3*  NEUTROABS 26.5*  --   --   --   HGB 10.2* 9.1* 10.3* 10.1*  HCT 32.3* 28.9* 33.7* 33.3*  MCV 95.3 93.8 94.9 95.1  PLT 146* 141* 148* 132*   Blood Culture    Component Value Date/Time   SDES WOUND LEFT THIGH 03/14/2017 1132   SDES WOUND LEFT THIGH 03/14/2017 1132   SPECREQUEST VEIN HARVEST INCISION POF FORTAZ 03/14/2017 1132   SPECREQUEST VEIN HARVEST INCISION POF FORTAZ 03/14/2017 1132   CULT NO ANAEROBES ISOLATED 03/14/2017 1132   CULT RARE PSEUDOMONAS AERUGINOSA 03/14/2017 1132   REPTSTATUS 03/19/2017 FINAL 03/14/2017 1132   REPTSTATUS 03/17/2017 FINAL 03/14/2017 1132    Cardiac Enzymes:  Recent Labs Lab 04/21/2017 1811 04/25/2017 2315 04/21/17 4540  04/21/17 1147 04/22/17 0356  TROPONINI 0.83* 0.72* 0.61* 0.58* 0.34*   CBG:  Recent Labs Lab 2017/05/08 1457 2017-05-08 2008  GLUCAP 93 81   Iron Studies: No results for input(s): IRON, TIBC, TRANSFERRIN, FERRITIN in the last 72 hours. Lab Results  Component Value Date   INR 1.83 04/23/2017   INR 1.87 05/08/17   INR 1.34 03/14/2017   Studies/Results: Dg Chest Port 1 View  Result Date: 04/21/2017 CLINICAL DATA:  Shock. EXAM: PORTABLE CHEST 1 VIEW COMPARISON:  March 05, 2017 FINDINGS: Stable 3 lead AICD device. Cardiomegaly. The hila and mediastinum are normal. No pneumothorax. No pulmonary nodules or masses. No focal infiltrates. Mild haziness over the right base could represent a tiny layering effusion and atelectasis.  IMPRESSION: Mild haziness over the right base could represent a tiny layering effusion and atelectasis. No other acute abnormalities. Electronically Signed   By: Gerome Sam III M.D   On: 04/21/2017 11:54   Medications: . sodium chloride    . sodium chloride    . methocarbamol (ROBAXIN)  IV     . aspirin  81 mg Oral Daily  . [START ON 04/27/2017] calcitRIOL  0.5 mcg Oral Q T,Th,Sa-HD  . darbepoetin (ARANESP) injection - DIALYSIS  100 mcg Intravenous Q Sat-HD  . docusate sodium  100 mg Oral BID  . heparin subcutaneous  5,000 Units Subcutaneous Q8H  . midodrine  10 mg Oral TID WC    I have seen and examined this patient and agree with plan and assessment in the above note with renal recommendations/intervention highlighted.  Very lethargic after receiving ativan.  Not eating or able to take p o.  Will hold off any vitamin D and calcium.  Will resume sensipar and follow Ca/Phos levels.  Jomarie Longs A Jeron Grahn,MD 04/23/2017 1:57 PM

## 2017-04-23 NOTE — Progress Notes (Signed)
PULMONARY / CRITICAL CARE MEDICINE   Name: Willie Andrade MRN: [id number] DOB: 1950-10-06 PCP [name], MD LOS 3 as of 04/23/2017     ADMISSION DATE:  May 01, 2017 CONSULTATION DATE:  9/22  BRIEF   66 year old male with PMH as below, which is significant for systolic CHF, ESRD on HD, hepatitis B, PVD, and severe protein calorie malnutrition. He is a current smoker. 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 Lajoyce Corners. Intraoperatively he required phenylephrine for BP support, and continued to need this in the post-operative setting. PCCM asked to eval for ICU management.    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  PAST SURGICAL HISTORY: He  has a past surgical history that includes BiV ICD Generator Changeout (N/A, 11/28/2016); ABDOMINAL AORTOGRAM W/LOWER EXTREMITY (N/A, 01/22/2017); Bypass graft femoral-peroneal (Left, 01/24/2017); Drainage and closure of lymphocele (Left, 03/07/2017); I&D extremity (Left, 03/14/2017); Amputation (Bilateral, 01-May-2017); and Application if wound vac (Bilateral, 2017/05/01).   EVENTS 9/23 - off pressors moved to floor. Echo ef 20%   SUBJECTIVE/OVERNIGHT/INTERVAL HX 9.24/18 - recalled due to mild lethargy and low BP. Son at bedside who says  Patient BP always low - does not know baseline. Chart review shows MAP > /= 60 I s acceptable. SOn says patient looks same since post op. Currently RT side MAP 57 with SBP 84 and left side MAP 60 with SBP 85.   VITAL SIGNS: BP (!) 86/43   Pulse 93   Temp 98.3 F (36.8 C) (Oral)   Resp  16   Ht 6\' 2"  (1.88 m)   Wt 66.7 kg (147 lb)   SpO2 100%   BMI 18.87 kg/m   HEMODYNAMICS:    VENTILATOR SETTINGS:    INTAKE / OUTPUT: I/O last 3 completed shifts: In: 1088.8 [P.O.:250; I.V.:838.8] Out: 0      EXAM  General Appearance:    Looks chronically unwell and deconditoned  Head:    Normocephalic, without obvious abnormality, atraumatic  Eyes:    PERRL - yes, conjunctiva/corneas - clear      Ears:    Normal external ear canals, both ears  Nose:   NG tube - no  Throat:  ETT TUBE - no , OG tube - no  Neck:   Supple,  No enlargement/tenderness/nodules     Lungs:     Clear to auscultation bilaterally,  Chest wall:    No deformity  Heart:    S1 and S2 normal, no murmur, CVP - no.  Pressors - no (on midodrine)  Abdomen:     Soft, no masses, no organomegaly  Genitalia:    Not done  Rectal:   not done  Extremities:   Extremities- bilateral BKA     Skin:   Intact in exposed areas .      Neurologic:   Lethargic but easoly arousable and localized pain and says "leave me alone"       LABS  PULMONARY No results for input(s): PHART, PCO2ART, PO2ART, HCO3, TCO2, O2SAT in the last 168 hours.  Invalid input(s): PCO2, PO2  CBC  Recent Labs Lab 04/21/17 1448 04/22/17 0253 04/23/17 4076  HGB 9.1* 10.3* 10.1*  HCT 28.9* 33.7* 33.3*  WBC 17.3* 23.9* 17.3*  PLT 141* 148* 132*    COAGULATION  Recent Labs Lab 04/07/2017 1811 04/23/17 0219  INR 1.87 1.83    CARDIAC   Recent Labs Lab 04/09/2017 1811 04/14/2017 2315 04/21/17 0538 04/21/17 1147 04/22/17 0356  TROPONINI 0.83* 0.72* 0.61* 0.58* 0.34*   No results for input(s): PROBNP in the last 168 hours.   CHEMISTRY  Recent Labs Lab 04/10/2017 1224 04/06/2017 1811 04/21/17 1500 04/22/17 0253 04/23/17 0219  NA 138 139 138 138 140  K 3.8 4.0 4.4 4.3 4.5  CL  --  97* 99* 100* 100*  CO2  --  21*  GLUCOSE 93 82 74 99 74  BUN  --  38* 45* 33* 44*  CREATININE  --  6.94* 7.24* 5.45* 6.24*   CALCIUM  --  10.3 10.0 10.0 10.5*  PHOS  --   --  9.3*  --   --    Estimated Creatinine Clearance: 11 mL/min (A) (by C-G formula based on SCr of 6.24 mg/dL (H)).   LIVER  Recent Labs Lab 04/23/2017 1811 04/21/17 1500 04/23/17 0219  ALBUMIN  --  1.9*  --   INR 1.87  --  1.83     INFECTIOUS  Recent Labs Lab 04/23/2017 1811 04/26/2017 2315 04/21/17 1147  LATICACIDVEN 2.5* 2.5* 1.7     ENDOCRINE CBG (last 3)   Recent Labs  04/06/2017 1457 04/01/2017 2008  GLUCAP 93 81         IMAGING x48h  - image(s) personally visualized  -   highlighted in bold No results found.     ASSESSMENT and PLAN  Hypotension With esrd, ef 20% and hix of hypotension - I think sbp 80s with MAP > 55 is chronic baseline. This is where he is now. I do not think he needs resus for this bp. Goal set as sbp > 80 with MAP > 55 though even > 50 might be acceptable  Encephalopathy acute This is multifactorial and recently probably precipitated by ativan in setting of esrd, malnutitrion and ef 25%. Agree with dc ativan. He has hypoacive delirium and is protecting airway. Agree with check abg. Monitor. No indication right now for ICU transfer  PCCM avail as needed      FAMILY  - Updates: 04/23/2017 --> son at bedside  - Inter-disciplinary family meet or Palliative Care meeting due by:  DAy 7. Current LOS is LOS 3 days  CODE STATUS    Code Status Orders        Start     Ordered   04/13/2017 1851  Full code  Continuous     04/26/2017 1850    Code Status History    Date Active Date Inactive Code Status Order ID Comments User Context   03/16/2017  4:02 AM 03/22/2017 10:15 PM Full Code [id number]  Mertha Finders, RN Inpatient   03/06/2017  2:06 AM 03/16/2017  4:02 AM Full Code [id number]  Therisa Doyne, MD Inpatient   02/11/2017  9:26 PM 02/13/2017  7:24 PM Full Code [id number]  Jonah Blue, MD Inpatient   01/26/2017  7:36 PM 01/28/2017  2:59 PM Full Code [id number]  Briscoe Deutscher, MD ED    01/17/2017  1:15 AM 01/26/2017  4:38 PM Full Code [id number]  Eduard Clos, MD ED   12/21/2016  3:57 PM 12/23/2016  6:23 PM Full Code [id number]  Marcos Eke, PA-C ED  11/28/2016  2:05 PM 11/28/2016  8:25 PM Full Code [id number]  Regan Lemming, MD Inpatient        DISPO Keep in 2w      Dr. Kalman Shan, M.D., F.C.C.P Pulmonary and Critical Care Medicine Staff Physician [facility] Pager: [phone], If no answer or between  15:00h - 7:00h: call [phone]  04/23/2017 2:42 PM

## 2017-04-23 NOTE — Progress Notes (Addendum)
Patient remained lethargic and not able to take po.  BP 86/44, not able to take pills including midodrine.  Holding IVF because of ESRD.  Patient was just  Transferred from PCCM.   Plan:  Check ABG Check blood cultures Check ammonia level DC ativan and oxycodone today. PCCM consulted and discussed with Ramaswamy. Patient will be seen. Continue to monitor.

## 2017-04-23 NOTE — Consult Note (Addendum)
The patient has been seen in conjunction with Willie Page, NP-C. All aspects of care have been considered and discussed. The patient has been personally interviewed, examined, and all clinical data has been reviewed.   No background cardiac history is available. Attempted to get info as OP.  Has severe dilated cardiomopathy with mildly elevated , flat Troponin I trend suggesting non-ACS origin. Suspect demand related elevation due to heart failure.  Severe systolic heart failure with Cheyne-Stokes respiratory. Respiratory pattern may be present due to recent sedation given prior to our exam. BP 90/60 mmHg. Has been hypotensive since admission.  Will return tomorrow when more alert.  Overall prognosis is poor. We need to discuss with patient when more alert.  Cardiology Consult    Patient ID: Willie Andrade MRN: [id number], DOB/AGE: 1951/01/01   Admit date: 04/01/2017 Date of Consult: 04/23/2017  Primary Physician: [name], MD Primary Cardiologist: Katrinka Blazing Requesting Provider: Ronalee Belts Reason for Consultation: Positive Troponin  Willie Andrade is a 66 y.o. male who is being seen today for the evaluation of positive troponin at the request of Dr. Ronalee Belts.   Patient Profile    66 yo male with PMH of ESRD on HD, HTN, PVD, HL, and Cardiomyopathy s/p PPM/ICD who presented for bilateral BKAs on 9/21 with Dr. Lajoyce Corners and developed hypotension.   Past Medical History   Past Medical History:  Diagnosis Date  . Chronic systolic CHF (congestive heart failure), NYHA class 2 (HCC)   . ESRD (end stage renal disease) on dialysis (HCC)   . Hepatitis B   . HTN (hypertension)   . Hyperlipidemia   . 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 Peripheral vascular disease (HCC)   . Protein-calorie malnutrition, severe (HCC)   . Tobacco abuse     Past Surgical History:  Procedure Laterality Date  . ABDOMINAL AORTOGRAM W/LOWER  EXTREMITY N/A 01/22/2017   Procedure: Abdominal Aortogram w/Lower Extremity;  Surgeon: [name], MD;  Location: [facility] IN[facility];  Service: Cardiovascular;  Laterality: N/A;  . AMPUTATION Bilateral 04/23/2017   Procedure: BILATERAL BELOW KNEE AMPUTATION;  Surgeon: [name], MD;  Location: [facility] OR;  Service: Orthopedics;  Laterality: Bilateral;  . APPLICATION OF WOUND VAC Bilateral 04/13/2017   Procedure: APPLICATION OF WOUND VAC, bilateral below knee amputation stumps;  Surgeon: [name], MD;  Location: [facility] OR;  Service: Orthopedics;  Laterality: Bilateral;  . BIV ICD GENERATOR CHANGEOUT N/A 11/28/2016   Procedure: BiV ICD [employer]; St Jude Surgeon: Will Jorja Loa, MD;  Location: [facility]VAS[facility] CV LAB;  Service: Cardiovascular;  Laterality: N/A;  . BYPASS GRAFT FEMORAL-PERONEAL Left 01/24/2017   Procedure: Left Leg  FEMORAL-PERONEAL Bypass Graft;  Surgeon: [name], MD;  Location: [facility] OR;  Service: Vascular;  Laterality: Left;  . DRAINAGE AND CLOSURE OF LYMPHOCELE Left 03/07/2017   Procedure: EXCISION AND CLOSURE OF LEFT GROIN LYMPHOCELE;  Surgeon: Fransisco Hertz, MD;  Location: [facility] OR;  Service: Vascular;  Laterality: Left;  . I&D EXTREMITY Left 03/14/2017   Procedure: IRRIGATION AND DEBRIDEMENT LEFT GROIN AND THIGH INCISIONS and LOWER LEG INCISION;  Surgeon: [name], MD;  Location: [facility] OR;  Service: Vascular;  Laterality: Left;     Allergies  No Known Allergies  History of Present Illness    Mr. Willie Andrade Is a 66 year old male with past medical history of ESRD on HD, HTN, PVD, HL, and Cardiomyopathy s/p PPM/ICD. He was referred by  Dr. Laurey Morale to cardiology and was seen by Dr. Katrinka Blazing on 3/18. Past medical history was unclear at that time, but noted history of "pacemaker, stent, dialysis, alcoholism and smoking". It was felt that his systolic heart failure was most likely related to uncontrolled hypertension over the years, but no records were obtained regarding  previous cardiac cath. Medical release forms were sent to [facility] health system, but no medical records were received. Given his history of a "pacemaker", he was referred to EP for further evaluation. On further evaluation it was noted his CRT-D had reached ERI, and he was referred for generator change on 5/18 for BIV ICD. He was followed by vascular surgery, as an outpatient and noted to have worsening gangrene to bilateral lower extremities.  Most recently was admitted on 04/23/2017 and underwent bilateral BKA's with Dr. Lajoyce Corners. Intraoperatively he required phenylephrine for blood pressure support, and PCC and was consult and for further management and transferred to ICU. Postoperatively placed on Ancef for postoperative prophylaxis. He was given gentle IV fluids, and weaned off of pressors on 04/21/17. Had postop surgical anemia. Also followed by nephrology for HD needs this admission. Apparently early on in admission postoperatively reported chest pain. Troponin cycled 0.83>> 0.72>>0.61>>0.34. EKG showed sinus rhythm, atrial sensed V paced. Echocardiogram showed known EF of 20%-25%, which is actually improved from previous, with diffuse hypokinesis, and grade 2 diastolic dysfunction. He was transferred from ICU to telemetry on 04/22/17. Appears that plans were to potentially discharge patient to SNF for further rehabilitation. Earlier this a.m. received a dose of Ativan, and has been lethargic and unable to take by mouth medication throughout the day. Blood pressures maintained in the 80s systolic, but he has been unable to take anything by mouth. At the time of assessment patient is lethargic, and does not respond properly to verbal stimuli. Primary aware currently undergoing workup.  Inpatient Medications    . aspirin  81 mg Oral Daily  . [START ON May 21, 2017] Chlorhexidine Gluconate Cloth  6 each Topical Q0600  . [START ON May 21, 2017] cinacalcet  180 mg Oral Q T,Th,Sa-HD  . darbepoetin (ARANESP)  injection - DIALYSIS  100 mcg Intravenous Q Sat-HD  . docusate sodium  100 mg Oral BID  . feeding supplement (NEPRO CARB STEADY)  237 mL Oral BID BM  . feeding supplement (PRO-STAT SUGAR FREE 64)  30 mL Oral BID  . heparin subcutaneous  5,000 Units Subcutaneous Q8H  . midodrine  10 mg Oral TID WC  . multivitamin  1 tablet Oral QHS  . mupirocin ointment  1 application Nasal BID  . sevelamer carbonate  3,200 mg Oral TID WC    Family History    Family History  Problem Relation Age of Onset  . Diabetes Mother   . Gout Mother   . Stroke Father   . Gout Maternal Grandmother   . Gout Sister   . Asthma Brother     Social History    Social History   Social History  . Marital status: Legally Separated    Spouse name: N/A  . Number of children: N/A  . Years of education: N/A   Occupational History  . Not on file.   Social History Main Topics  . Smoking status: Current Every Day Smoker    Packs/day: 1.00    Years: 40.00    Types: Cigarettes  . Smokeless tobacco: Never Used  . Alcohol use No  . Drug use: No  . Sexual activity: Not on file  Other Topics Concern  . Not on file   Social History Narrative  . No narrative on file     Review of Systems    See HPI  All other systems reviewed and are otherwise negative except as noted above.  Physical Exam    Blood pressure (!) 86/53, pulse 92, temperature 98.3 F (36.8 C), temperature source Oral, resp. rate 20, height  (1.88 m), weight 147 lb (66.7 kg), SpO2 100 %.  General: Obtunded older AA male, maintaining O2 sats on RA Neuro: Obtunded, wearing mitten restraints.  HEENT: Normal  Neck: Supple, mild JVD. Lungs:  Uneven and labored, diminished bilaterally . Heart: RRR + s3, s4, no murmurs. Abdomen: Soft, non-tender, non-distended, BS + x 4.  Extremities: No clubbing, cyanosis or edema. Bilateral BKAs  Labs    Troponin (Point of Care Test) No results for input(s): TROPIPOC in the last 72 hours.  Recent  Labs  04/28/2017 2315 04/21/17 0538 04/21/17 1147 04/22/17 0356  TROPONINI 0.72* 0.61* 0.58* 0.34*   Lab Results  Component Value Date   WBC 17.3 (H) 04/23/2017   HGB 10.1 (L) 04/23/2017   HCT 33.3 (L) 04/23/2017   MCV 95.1 04/23/2017   PLT 132 (L) 04/23/2017    Recent Labs Lab 04/23/17 0219  NA 140  K 4.5  CL 100*  CO2 21*  BUN 44*  CREATININE 6.24*  CALCIUM 10.5*  GLUCOSE 74   No results found for: CHOL, HDL, LDLCALC, TRIG No results found for: [facility]   Radiology Studies    Dg Chest Port 1 View  Result Date: 04/21/2017 CLINICAL DATA:  Shock. EXAM: PORTABLE CHEST 1 VIEW COMPARISON:  March 05, 2017 FINDINGS: Stable 3 lead AICD device. Cardiomegaly. The hila and mediastinum are normal. No pneumothorax. No pulmonary nodules or masses. No focal infiltrates. Mild haziness over the right base could represent a tiny layering effusion and atelectasis. IMPRESSION: Mild haziness over the right base could represent a tiny layering effusion and atelectasis. No other acute abnormalities. Electronically Signed   By: Gerome Sam III M.D   On: 04/21/2017 11:54    ECG & Cardiac Imaging    EKG: Vpaced  Echo: 04/22/17  Study Conclusions  - Left ventricle: The cavity size was severely dilated. Systolic   function was severely reduced. The estimated ejection fraction   was in the range of 20% to 25%. Diffuse hypokinesis. Features are   consistent with a pseudonormal left ventricular filling pattern,   with concomitant abnormal relaxation and increased filling   pressure (grade 2 diastolic dysfunction). - Ventricular septum: The contour showed diastolic flattening and   systolic flattening. - Mitral valve: There was mild regurgitation. - Left atrium: The atrium was moderately dilated. - Right ventricle: The cavity size was moderately dilated. - Right atrium: The atrium was mildly dilated. - Tricuspid valve: There was moderate regurgitation. - Pulmonary arteries: Systolic  pressure was moderately increased.   PA peak pressure: 59 mm Hg (S).  Assessment & Plan    66 yo male with PMH of ESRD on HD, HTN, PVD, HL, and Cardiomyopathy s/p PPM/ICD who presented for bilateral BKAs on 9/21 with Dr. Lajoyce Corners and developed hypotension.   1. Elevated Troponin: Appears that he reported chest pain earlier this admission, but he received ativan and has been obtunded this morning/afternoon. Unable to obtain hx now. Trop with mild flat trend in the setting of ESRD and known HF. EKG vpaced with no acute changes. Echo with known severely reduced EF. At  this time it is very difficult to attempt to optimized medical therapy with his blood pressures. Given his neurological status will not proceed forward with any invasive therapies. Currently on ASA, could consider statin therapy once past acute state.   2. Combined HF s/p BiV ICD: Known severely reduced EF of 20%. Suspect this is playing a significant role in respiratory status at this time. Nephrology following for HD, plans for tomorrow.   3. Hypotension: Developed perioperatively and has maintained a systolic pressure in the 80s. No room for adjustment of medical therapy at this time.   4. Acute encephalopathy: Question whether 2/2 to ativan. Has been unable to take anything oral most of the day. ABG, blood cultures, and ammonia ordered by primary  5. Leukocytosis: on stress dose steroids. Management per primary  Signed, Willie Page, NP-C Pager [phone] 04/23/2017, 3:30 PM

## 2017-04-23 NOTE — Progress Notes (Signed)
Pt was given his docusate sodium (colace) tablet with sips of apple juice but he refused to swallow the pill. After explaining to him the need to swallow the pill, he spit it back out across his bed and stated, "I don't want this, just leave me alone." Will continue to monitor.

## 2017-04-23 NOTE — Progress Notes (Addendum)
PROGRESS NOTE    Willie Andrade  CXK:[id number] DOB: 1951/05/28 DOA: 04/21/2017 PCP: [name], MD   Brief Narrative: 66 year old male with history of CHF, ESRD on hemodialysis, peripheral vascular disease, severe protein calorie malnutrition, ex-smoker admitted with bilateral lower extremities gangrene status post bilateral BKA on 9/21 under Dr. Lajoyce Corners. The patient became hypotensive intraoperatively required phenylephrine drip and ICU admission. Patient was transferred to [facility] on 9/24.  Assessment & Plan:   # Hypovolemic shock: Blood pressure is better improved. Currently on midodrine for her chronic hypotension. Continue to monitor blood pressure. -Currently off pressors and stress dose steroid  #Chronic systolic and diastolic congestive heart failure with elevated troponin: Patient with EF of 20-25% with diffuse hypokinesis and elevated troponin level. Patient is dialysis patient. -Continue aspirin -Cardiology consulted.  #History of paroxysmal atrial fibrillation: Patient has pacemaker.  #ESRD on hemodialysis: Nephrology following. Monitor BMP. Dialysis related treatment as per nephrologist.  #Anemia of chronic illness/chronic kidney disease: Hemoglobin is stable. Monitor CBC  #Acute encephalopathy likely due to pain medication: Discontinue Ativan. Currently on oxycodone for pain management after the surgery.  #Bilateral lower extremities Status Post BKA: Follow-Up Orthopedics. Patient Has Wound VAC.  #Leukocytosis likely due to stress dose steroid and stress from the surgery. Patient is afebrile. Monitor.  PT, OT evaluation. Likely discharge to a skilled facility. Discussed with Child psychotherapist.  DVT prophylaxis: Heparin subcutaneous Code Status: Full code Family Communication: No family at bedside Disposition Plan: Currently admitted    Consultants:   Admitted by pulmonary critical care and orthopedics  Cardiology  Procedures: Bilateral BKA Antimicrobials:  None  Subjective: Seen and examined at bedside. She was allowed awake but mostly somnolent. He just received Ativan. Review of systems Limited  Objective: Vitals:   04/22/17 1600 04/22/17 1730 04/22/17 2141 04/23/17 0823  BP: (!) 85/47 (!) 88/56 (!) 94/50 (!) 100/56  Pulse: 84 80 86 82  Resp: (!) 9  16 16   Temp:  98.5 F (36.9 C) 98 F (36.7 C) 98.1 F (36.7 C)  TempSrc:  Oral Oral Oral  SpO2: (!) 86% 92% 96% 96%  Weight:   66.7 kg (147 lb)   Height:        Intake/Output Summary (Last 24 hours) at 04/23/17 1109 Last data filed at 04/22/17 2143  Gross per 24 hour  Intake              250 ml  Output                0 ml  Net              250 ml   Filed Weights   04/21/17 1721 04/22/17 0500 04/22/17 2141  Weight: 66.5 kg (146 lb 9.7 oz) 66.6 kg (146 lb 13.2 oz) 66.7 kg (147 lb)    Examination:  General exam: Appears calm and comfortable  Respiratory system: Clear to auscultation. Respiratory effort normal. No wheezing or crackle Cardiovascular system: S1 & S2 heard, RRR.  No pedal edema. Gastrointestinal system: Abdomen is nondistended, soft and nontender. Normal bowel sounds heard. Central nervous system: Alert Awake and somnolent Extremities: Bilateral BKA with a wound VAC Skin: No rashes, lesions or ulcers     Data Reviewed: I have personally reviewed following labs and imaging studies  CBC:  Recent Labs Lab 04/07/2017 1224 04/17/2017 1811 04/21/17 1448 04/22/17 0253 04/23/17 0219  WBC  --  28.5* 17.3* 23.9* 17.3*  NEUTROABS  --  26.5*  --   --   --  HGB 11.6* 10.2* 9.1* 10.3* 10.1*  HCT 34.0* 32.3* 28.9* 33.7* 33.3*  MCV  --  95.3 93.8 94.9 95.1  PLT  --  146* 141* 148* 132*   Basic Metabolic Panel:  Recent Labs Lab 04/25/2017 1224 03/31/2017 1811 04/21/17 1500 04/22/17 0253 04/23/17 0219  NA 138 139 138 138 140  K 3.8 4.0 4.4 4.3 4.5  CL  --  97* 99* 100* 100*  CO2  --  21*  GLUCOSE 93 82 74 99 74  BUN  --  38* 45* 33* 44*  CREATININE   --  6.94* 7.24* 5.45* 6.24*  CALCIUM  --  10.3 10.0 10.0 10.5*  PHOS  --   --  9.3*  --   --    GFR: Estimated Creatinine Clearance: 11 mL/min (A) (by C-G formula based on SCr of 6.24 mg/dL (H)). Liver Function Tests:  Recent Labs Lab 04/21/17 1500  ALBUMIN 1.9*   No results for input(s): LIPASE, AMYLASE in the last 168 hours. No results for input(s): AMMONIA in the last 168 hours. Coagulation Profile:  Recent Labs Lab 04/06/2017 1811 04/23/17 0219  INR 1.87 1.83   Cardiac Enzymes:  Recent Labs Lab 03/31/2017 1811 04/27/2017 2315 04/21/17 0538 04/21/17 1147 04/22/17 0356  TROPONINI 0.83* 0.72* 0.61* 0.58* 0.34*   BNP (last 3 results) No results for input(s): PROBNP in the last 8760 hours. HbA1C: No results for input(s): HGBA1C in the last 72 hours. CBG:  Recent Labs Lab 04/08/2017 1457 04/04/2017 2008  GLUCAP 93 81   Lipid Profile: No results for input(s): CHOL, HDL, LDLCALC, TRIG, CHOLHDL, LDLDIRECT in the last 72 hours. Thyroid Function Tests: No results for input(s): TSH, T4TOTAL, FREET4, T3FREE, THYROIDAB in the last 72 hours. Anemia Panel: No results for input(s): VITAMINB12, FOLATE, FERRITIN, TIBC, IRON, RETICCTPCT in the last 72 hours. Sepsis Labs:  Recent Labs Lab 04/11/2017 1811 04/14/2017 2315 04/21/17 1147  LATICACIDVEN 2.5* 2.5* 1.7    Recent Results (from the past 240 hour(s))  MRSA PCR Screening     Status: Abnormal   Collection Time: 04/19/2017  6:57 PM  Result Value Ref Range Status   MRSA by PCR POSITIVE (A) NEGATIVE Final    Comment:        The GeneXpert MRSA Assay (FDA approved for NASAL specimens only), is one component of a comprehensive MRSA colonization surveillance program. It is not intended to diagnose MRSA infection nor to guide or monitor treatment for MRSA infections. RESULT CALLED TO, READ BACK BY AND VERIFIED WITHKathryne Hitch RN 1610 04/17/2017 A BROWNING          Radiology Studies: Dg Chest Port 1 View  Result Date:  04/21/2017 CLINICAL DATA:  Shock. EXAM: PORTABLE CHEST 1 VIEW COMPARISON:  March 05, 2017 FINDINGS: Stable 3 lead AICD device. Cardiomegaly. The hila and mediastinum are normal. No pneumothorax. No pulmonary nodules or masses. No focal infiltrates. Mild haziness over the right base could represent a tiny layering effusion and atelectasis. IMPRESSION: Mild haziness over the right base could represent a tiny layering effusion and atelectasis. No other acute abnormalities. Electronically Signed   By: Gerome Sam III M.D   On: 04/21/2017 11:54        Scheduled Meds: . aspirin  81 mg Oral Daily  . [START ON 04/26/2017] calcitRIOL  1.75 mcg Oral Q T,Th,Sa-HD  . [START ON 05-21-2017] cinacalcet  180 mg Oral Q T,Th,Sa-HD  . darbepoetin (ARANESP) injection - DIALYSIS  100 mcg Intravenous Q  Sat-HD  . docusate sodium  100 mg Oral BID  . feeding supplement (NEPRO CARB STEADY)  237 mL Oral BID BM  . feeding supplement (PRO-STAT SUGAR FREE 64)  30 mL Oral BID  . heparin subcutaneous  5,000 Units Subcutaneous Q8H  . midodrine  10 mg Oral TID WC  . multivitamin  1 tablet Oral QHS  . sevelamer carbonate  3,200 mg Oral TID WC   Continuous Infusions: . sodium chloride    . sodium chloride    . methocarbamol (ROBAXIN)  IV       LOS: 3 days    Dron Jaynie Collins, MD [facility] Pager [phone]  If 7PM-7AM, please contact night-coverage [email or web address] Password [email or web address] 04/23/2017, 11:09 AM

## 2017-04-23 NOTE — Progress Notes (Signed)
Pt was agitated and restless all evening, kept pulling off his EKG leads, wound vac dressing, and trying to get out of bed. He stated, "I know what I am doing, get me out of here." Mittens were placed bilaterally on hands, pt took them off a few minutes later and continued ripping off his leads and wound vac dressing. Called Dr.Blackman for anti-anxiety medication, verbal order with read-back was given over the phone for Ativan (see MAR).

## 2017-04-24 ENCOUNTER — Inpatient Hospital Stay (HOSPITAL_COMMUNITY): Payer: Medicare Other

## 2017-04-24 DIAGNOSIS — I469 Cardiac arrest, cause unspecified: Secondary | ICD-10-CM

## 2017-04-24 LAB — BASIC METABOLIC PANEL
Anion gap: 24 — ABNORMAL HIGH (ref 5–15)
BUN: 56 mg/dL — AB (ref 6–20)
CHLORIDE: 100 mmol/L — AB (ref 101–111)
CO2: 15 mmol/L — AB (ref 22–32)
Calcium: 10.5 mg/dL — ABNORMAL HIGH (ref 8.9–10.3)
Creatinine, Ser: 6.98 mg/dL — ABNORMAL HIGH (ref 0.61–1.24)
GFR calc Af Amer: 8 mL/min — ABNORMAL LOW (ref >60)
GFR calc non Af Amer: 7 mL/min — ABNORMAL LOW (ref >60)
POTASSIUM: 7.2 mmol/L — AB (ref 3.5–5.1)
Sodium: 139 mmol/L (ref 135–145)

## 2017-04-24 LAB — CBC
HEMATOCRIT: 33.1 % — AB (ref 39.0–52.0)
HEMOGLOBIN: 10 g/dL — AB (ref 13.0–17.0)
MCH: 29.1 pg (ref 26.0–34.0)
MCHC: 30.2 g/dL (ref 30.0–36.0)
MCV: 96.2 fL (ref 78.0–100.0)
Platelets: 92 10*3/uL — ABNORMAL LOW (ref 150–400)
RBC: 3.44 MIL/uL — AB (ref 4.22–5.81)
RDW: 22.3 % — ABNORMAL HIGH (ref 11.5–15.5)
WBC: 25.2 10*3/uL — ABNORMAL HIGH (ref 4.0–10.5)

## 2017-04-24 LAB — GLUCOSE, CAPILLARY
GLUCOSE-CAPILLARY: 137 mg/dL — AB (ref 65–99)
Glucose-Capillary: 54 mg/dL — ABNORMAL LOW (ref 65–99)
Glucose-Capillary: 88 mg/dL (ref 65–99)
Glucose-Capillary: <10 mg/dL — CL (ref 65–99)

## 2017-04-24 LAB — MAGNESIUM: Magnesium: 2.6 mg/dL — ABNORMAL HIGH (ref 1.7–2.4)

## 2017-04-24 LAB — POCT I-STAT 3, VENOUS BLOOD GAS (G3P V)
ACID-BASE DEFICIT: 18 mmol/L — AB (ref 0.0–2.0)
BICARBONATE: 14.9 mmol/L — AB (ref 20.0–28.0)
O2 SAT: 28 %
PO2 VEN: 31 mmHg — AB (ref 32.0–45.0)
TCO2: 17 mmol/L — ABNORMAL LOW (ref 22–32)
pCO2, Ven: 74.7 mmHg (ref 44.0–60.0)
pH, Ven: 6.909 — CL (ref 7.250–7.430)

## 2017-04-24 LAB — BLOOD GAS, ARTERIAL
ACID-BASE DEFICIT: 12.5 mmol/L — AB (ref 0.0–2.0)
BICARBONATE: 15 mmol/L — AB (ref 20.0–28.0)
Drawn by: 345601
FIO2: 100
O2 Content: 15 L/min
O2 Saturation: 97.6 %
PATIENT TEMPERATURE: 98.6
PCO2 ART: 47.7 mmHg (ref 32.0–48.0)
PH ART: 7.126 — AB (ref 7.350–7.450)
pO2, Arterial: 184 mmHg — ABNORMAL HIGH (ref 83.0–108.0)

## 2017-04-24 LAB — PHOSPHORUS: Phosphorus: 9.3 mg/dL — ABNORMAL HIGH (ref 2.5–4.6)

## 2017-04-24 LAB — TROPONIN I: Troponin I: 0.97 ng/mL (ref <0.03)

## 2017-04-24 MED ORDER — MAGNESIUM SULFATE 2 GM/50ML IV SOLN
INTRAVENOUS | Status: AC
  Filled 2017-04-24: qty 50

## 2017-04-24 MED ORDER — CALCIUM CHLORIDE 10 % IV SOLN
INTRAVENOUS | Status: AC
  Filled 2017-04-24: qty 10

## 2017-04-24 MED ORDER — EPINEPHRINE PF 1 MG/10ML IJ SOSY
PREFILLED_SYRINGE | INTRAMUSCULAR | Status: AC
  Filled 2017-04-24: qty 40

## 2017-04-24 MED ORDER — NOREPINEPHRINE BITARTRATE 1 MG/ML IV SOLN
0.0000 ug/min | INTRAVENOUS | Status: DC
  Filled 2017-04-24 (×2): qty 4

## 2017-04-24 MED ORDER — EPINEPHRINE PF 1 MG/ML IJ SOLN
0.5000 ug/min | INTRAVENOUS | Status: DC
  Filled 2017-04-24: qty 4

## 2017-04-24 MED ORDER — GLUCAGON HCL RDNA (DIAGNOSTIC) 1 MG IJ SOLR
INTRAMUSCULAR | Status: AC
  Filled 2017-04-24: qty 1

## 2017-04-24 MED ORDER — DEXTROSE 5 % IV SOLN: INTRAVENOUS | Status: DC

## 2017-04-24 MED ORDER — VASOPRESSIN 20 UNIT/ML IV SOLN
0.0300 [IU]/min | INTRAVENOUS | Status: DC
  Filled 2017-04-24: qty 2

## 2017-04-24 MED ORDER — SODIUM BICARBONATE 8.4 % IV SOLN
INTRAVENOUS | Status: AC
  Filled 2017-04-24: qty 150

## 2017-04-24 MED ORDER — DEXTROSE 50 % IV SOLN
INTRAVENOUS | Status: AC
  Filled 2017-04-24: qty 50

## 2017-04-24 MED ORDER — PANTOPRAZOLE SODIUM 40 MG IV SOLR: 40.0000 mg | Freq: Every day | INTRAVENOUS | Status: DC

## 2017-04-24 MED ORDER — SODIUM CHLORIDE 0.9 % IV SOLN: 250.0000 mL | INTRAVENOUS | Status: DC | PRN

## 2017-04-24 MED FILL — Medication: Qty: 1 | Status: AC

## 2017-04-25 LAB — BLOOD CULTURE ID PANEL (REFLEXED)
ACINETOBACTER BAUMANNII: NOT DETECTED
CANDIDA ALBICANS: NOT DETECTED
CANDIDA GLABRATA: NOT DETECTED
CANDIDA KRUSEI: NOT DETECTED
Candida parapsilosis: NOT DETECTED
Candida tropicalis: NOT DETECTED
ENTEROBACTER CLOACAE COMPLEX: NOT DETECTED
ENTEROBACTERIACEAE SPECIES: NOT DETECTED
ESCHERICHIA COLI: NOT DETECTED
Enterococcus species: NOT DETECTED
Haemophilus influenzae: NOT DETECTED
Klebsiella oxytoca: NOT DETECTED
Klebsiella pneumoniae: NOT DETECTED
Listeria monocytogenes: NOT DETECTED
NEISSERIA MENINGITIDIS: NOT DETECTED
PROTEUS SPECIES: NOT DETECTED
Pseudomonas aeruginosa: NOT DETECTED
STREPTOCOCCUS AGALACTIAE: NOT DETECTED
STREPTOCOCCUS PNEUMONIAE: NOT DETECTED
STREPTOCOCCUS PYOGENES: NOT DETECTED
Serratia marcescens: NOT DETECTED
Staphylococcus aureus (BCID): NOT DETECTED
Staphylococcus species: NOT DETECTED
Streptococcus species: NOT DETECTED

## 2017-04-28 LAB — CULTURE, BLOOD (ROUTINE X 2)
CULTURE: NO GROWTH
SPECIAL REQUESTS: ADEQUATE
SPECIAL REQUESTS: ADEQUATE

## 2017-04-30 NOTE — Progress Notes (Addendum)
After CPR event MD pronounced patient expired at 0240. Family, RN and MD at bedside. Patient belongings sent home with family.

## 2017-04-30 NOTE — Code Documentation (Signed)
  Patient Name: Willie Andrade   MRN: [id number]   Date of Birth/ Sex: 13-Feb-1951 , male      Admission Date: 04/29/2017  Attending Provider: Maxie Barb, MD  Primary Diagnosis: <principal problem not specified>   Indication: Pt was in his usual state of health until this PM, when he was noted to be in pea arrest. Code blue was subsequently called. At the time of arrival on scene, ACLS protocol was underway.         Technical Description:  - CPR performance duration:  18 minutes  - Was defibrillation or cardioversion used? Yes   - Was external pacer placed? No  - Was patient intubated pre/post CPR? yes   Medications Administered: Y = Yes; Blank = No Amiodarone    Atropine    Calcium    Epinephrine  y  Lidocaine    Magnesium    Norepinephrine    Phenylephrine    Sodium bicarbonate  y  Vasopressin     Post CPR evaluation:  - Final Status - Was patient successfully resuscitated ? Yes - What is current rhythm? sinus - What is current hemodynamic status? hypotensive  Miscellaneous Information:  - Labs sent, including: ABG, mg,bmp, cbmc, ekg, troponin, phosphorus   - Primary team notified?  Yes  - Family Notified? Yes  - Additional notes/ transfer status:      Lorenso Courier, MD  09-May-2017, 1:03 AM

## 2017-04-30 NOTE — Progress Notes (Signed)
   Name: Sheng Foxwell MRN: [id number] DOB: [birth year]    ADMISSION DATE:  04/06/2017 CONSULTATION DATE:  04/21/2017  REFERRING MD :  Dr. Ronalee Belts   CHIEF COMPLAINT:  Post-Arrest   BRIEF PATIENT DESCRIPTION:  66 year old male with PMH as below, which is significant for systolic CHF, ESRD on HD, hepatitis B, PVD, and severe protein calorie malnutrition. He is a current smoker. 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 Lajoyce Corners.   On 9/25 patient found pulseless and apneic. Intubated. VFib arrest for about 15 minutes. Glucose <10. Patient Transferred to 21M.   SIGNIFICANT EVENTS  9/21 > Bilateral BKA  9/25 > Cardiac Arrest > Intubated   STUDIES:  CXR 9/22 > Mild haziness over the right base could represent a tiny layering effusion and atelectasis. No other acute abnormalities. ECHO 9/23 > EF 20-25, PA pressure 59   SUBJECTIVE:   VITAL SIGNS: Temp:  [97.2 F (36.2 C)-98.3 F (36.8 C)] 97.2 F (36.2 C) (09/25 0109) Pulse Rate:  [80-114] 114 (09/25 0105) Resp:  [16-26] 26 (09/25 0105) BP: (80-100)/(43-59) 80/46 (09/24 2037) SpO2:  [96 %-100 %] 100 % (09/25 0105) FiO2 (%):  [100 %] 100 % (09/25 0105) Weight:  [66.2 kg (146 lb)] 66.2 kg (146 lb) (09/24 2037)  PHYSICAL EXAMINATION: General:  Chronically ill adult male  Neuro:  Non-responsive HEENT:  Intubated  Cardiovascular:  Paced, Irregular  Lungs:  Diminished breath sounds, no wheeze  Abdomen:  Distended, active bowel sounds  Musculoskeletal:  Bilateral BKA, swelling to upper extremities  Skin:  Dry    Recent Labs Lab 04/22/17 0253 04/23/17 0219 05/06/17 0100  NA 138 140 139  K 4.3 4.5 7.2*  CL 100* 100* 100*  CO2 22 21* 15*  BUN 33* 44* 56*  CREATININE 5.45* 6.24* 6.98*  GLUCOSE 99 74 <20*    Recent Labs Lab 04/22/17 0253 04/23/17 0219 05-06-2017 0100  HGB 10.3* 10.1* 10.0*  HCT 33.7* 33.3* 33.1*  WBC 23.9* 17.3* 25.2*  PLT 148* 132* 92*    No results found.  ASSESSMENT / PLAN:  Acute Hypoxic Respiratory Distress VF Cardiac Arrest  Chronic Systolic HF (EF 20-25) A.Fib  Chronic Hypotension  Hypoglycemia  Hyperkalemia  End Stage Renal Disease  Acute Encephalopathy   Patient VF/VT arrest from [id number]. Glucose <10. Given Dextrose, 3 EPI pushes, and Shocked x 3, Intubated by anesthesia . Arrived to 21M at 0100. VT/VF arrest 0118-0223 (brief periods of ROSC), started on EPI, Vaso, and levophed gtt. Aline and CVC placed. K 7.2, Glucose 137 (POC), VBG 6.9/74/31.   Multiple calls to patient sister and son. Sister answered and was updated on status, verbalized she would try and get in touch with patient son. Family arrived to bedside at 0230. Patient pronounced at 0240.     CCT: 120 minutes   Jovita Kussmaul, AGACNP-BC [facility]  Pgr: [phone]  PCCM Pgr: [phone]

## 2017-04-30 NOTE — Procedures (Signed)
Central Venous Catheter Insertion Procedure Note Willie Andrade [id number] 1950/09/02  Procedure: Insertion of Central Venous Catheter Indications: Drug and/or fluid administration and Frequent blood sampling  Procedure Details Consent: Unable to obtain consent because of emergent medical necessity. Time Out: Verified patient identification, verified procedure, site/side was marked, verified correct patient position, special equipment/implants available, medications/allergies/relevent history reviewed, required imaging and test results available.    Maximum sterile technique was used including antiseptics, cap, gloves, gown, hand hygiene, mask and performed in code situation during cardiac arrest. Skin prep: Chlorhexidine; local anesthetic administered A antimicrobial bonded/coated triple lumen catheter was placed in the right femoral vein due to emergent situation using the Seldinger technique.  Evaluation Blood flow good Complications: No apparent complications Patient did tolerate procedure well. Chest X-ray ordered to verify placement.  Willie Andrade 04/10/2017, 3:25 AM

## 2017-04-30 NOTE — Progress Notes (Addendum)
Patient admitted to ICU post code. RN and MD at bedside witnessed pulseless v-tach. CPR started. Code drugs given. See code sheet. Family notified.

## 2017-04-30 NOTE — Procedures (Signed)
Arterial Catheter Insertion Procedure Note Gurvinder Agosto [id number] [birth year]  Procedure: Insertion of Arterial Catheter  Indications: Blood pressure monitoring  Procedure Details Consent: Unable to obtain consent because of emergent medical necessity. Time Out: Verified patient identification, verified procedure, site/side was marked, verified correct patient position, special equipment/implants available, medications/allergies/relevent history reviewed, required imaging and test results available.    Maximum sterile technique was used including antiseptics, cap, gloves, gown, hand hygiene, mask and performed in code situation during cardiac arrest. Skin prep: Chlorhexidine; local anesthetic administered 20 gauge catheter was inserted into right femoral artery using the Seldinger technique.  Evaluation Blood flow good; BP tracing good. Complications: No apparent complications.   [name] 04/01/2017

## 2017-04-30 NOTE — Code Documentation (Signed)
Pt coded, see code sheet.  Pt transferred to 2M13 once pulse returned.

## 2017-04-30 NOTE — Progress Notes (Signed)
Critical Event Note  Time Noted: 0030  Event Type: Cardiac Arrest/Pulseless   Initial Assessment:  Notified by CCMD patient was asystole underneath V-pacing. Pt found unresponsive and pulseless.    Interventions:  CPR initiatied , Code Blue initiated, Pt intubated at bedside  And transfer to 2M13. Page to on-call Linton Flemings for notification. [place] to bedside.  Notification: Attempt to call patient's son Derald Macleod at both numbers listed with no answer. Second call to patient's sister Monroe Valone with answer and notification of patients transfer/status - Sister to come as soon as she can.  Burley Saver, BSN, RN-BC

## 2017-04-30 NOTE — Procedures (Signed)
During cardiac arrest, Intraosseus catheter placed into left humerus after skin prep with alcohol. Catheter remained upright after placement, BM aspiration noted and flushed easily.  [name], MD  Critical Care Medicine [facility] Pager: [phone]

## 2017-04-30 NOTE — Death Summary Note (Addendum)
Death Summary  Willie Andrade IOM:[id number] DOB: [birth year] DOA: May 08, 2017  PCP: [name], MD  Admit date: 2017-05-08 Date of Death: 05-12-17 Time of Death: 2:40 AM Notification: [name], MD notified of death of 05/12/17   History of present illness:  66 year old male with history of CHF, ESRD on hemodialysis, peripheral vascular disease, severe protein calorie malnutrition, ex-smoker admitted with bilateral lower extremities gangrene status post bilateral BKA on 05/09/23 under Dr. Lajoyce Corners. The patient became hypotensive intraoperatively required phenylephrine drip and ICU admission. The patient was transferred to tele floor by PCCM on 9/24 to [facility] service. Patient remained hypotensive, lethargic. PCCM was re-consulted and cardiology was consulted. ABG unremarkable. On the night of 9/24 patient had cardiac arrest and passed away on 13-May-2023, 2:40 AM in ICU.   Please see below for problem list: # Hypovolemic shock: #Chronic systolic and diastolic congestive heart failure with elevated troponin: Patient with EF of 20-25% with diffuse hypokinesis and elevated troponin level.  #History of paroxysmal atrial fibrillation: Patient has pacemaker. #ESRD on hemodialysis: #Anemia of chronic illness/chronic kidney disease: #Acute encephalopathy, etiology unknown #Bilateral lower extremities Status Post BKA: #Leukocytosis likely due to stress dose steroid and stress from the surgery. # acute respiratory distress.    The results of significant diagnostics from this hospitalization (including imaging, microbiology, ancillary and laboratory) are listed below for reference.    Significant Diagnostic Studies: Dg Chest Port 1 View  Result Date: 04/21/2017 CLINICAL DATA:  Shock. EXAM: PORTABLE CHEST 1 VIEW COMPARISON:  March 05, 2017 FINDINGS: Stable 3 lead AICD device. Cardiomegaly. The hila and mediastinum are normal. No pneumothorax. No pulmonary nodules or masses. No focal infiltrates. Mild  haziness over the right base could represent a tiny layering effusion and atelectasis. IMPRESSION: Mild haziness over the right base could represent a tiny layering effusion and atelectasis. No other acute abnormalities. Electronically Signed   By: Gerome Sam III M.D   On: 04/21/2017 11:54    Microbiology: Recent Results (from the past 240 hour(s))  MRSA PCR Screening     Status: Abnormal   Collection Time: 05-08-2017  6:57 PM  Result Value Ref Range Status   MRSA by PCR POSITIVE (A) NEGATIVE Final    Comment:        The GeneXpert MRSA Assay (FDA approved for NASAL specimens only), is one component of a comprehensive MRSA colonization surveillance program. It is not intended to diagnose MRSA infection nor to guide or monitor treatment for MRSA infections. RESULT CALLED TO, READ BACK BY AND VERIFIED WITH: B WOODSON RN 2157 2017/05/08 A BROWNING   Culture, blood (routine x 2)     Status: None (Preliminary result)   Collection Time: 04/23/17  3:34 PM  Result Value Ref Range Status   Specimen Description BLOOD RIGHT HAND  Final   Special Requests IN PEDIATRIC BOTTLE Blood Culture adequate volume  Final   Culture NO GROWTH < 24 HOURS  Final   Report Status PENDING  Incomplete     Labs: Basic Metabolic Panel:  Recent Labs Lab 05/08/17 1811 04/21/17 1500 04/22/17 0253 04/23/17 0219 05/12/2017 0100  NA 139 138 138 140 139  K 4.0 4.4 4.3 4.5 7.2*  CL 97* 99* 100* 100* 100*  CO2 23 24 22  21* 15*  GLUCOSE 82 74 99 74 <20*  BUN 38* 45* 33* 44* 56*  CREATININE 6.94* 7.24* 5.45* 6.24* 6.98*  CALCIUM 10.3 10.0 10.0 10.5* 10.5*  MG  --   --   --   --  2.6*  PHOS  --  9.3*  --   --  9.3*   Liver Function Tests:  Recent Labs Lab 04/21/17 1500  ALBUMIN 1.9*   No results for input(s): LIPASE, AMYLASE in the last 168 hours.  Recent Labs Lab 04/23/17 1534  AMMONIA 37*   CBC:  Recent Labs Lab 04/01/2017 1811 04/21/17 1448 04/22/17 0253 04/23/17 0219 05/04/2017 0100  WBC  28.5* 17.3* 23.9* 17.3* 25.2*  NEUTROABS 26.5*  --   --   --   --   HGB 10.2* 9.1* 10.3* 10.1* 10.0*  HCT 32.3* 28.9* 33.7* 33.3* 33.1*  MCV 95.3 93.8 94.9 95.1 96.2  PLT 146* 141* 148* 132* 92*   Cardiac Enzymes:  Recent Labs Lab 04/16/2017 2315 04/21/17 0538 04/21/17 1147 04/22/17 0356 2017/05/04 0100  TROPONINI 0.72* 0.61* 0.58* 0.34* 0.97*   D-Dimer No results for input(s): DDIMER in the last 72 hours. BNP: Invalid input(s): POCBNP CBG:  Recent Labs Lab 04/08/2017 2008 05-04-17 0043 2017/05/04 0050 05-04-17 0106 2017-05-04 0220  GLUCAP 81 <10* 54* 88 137*   Anemia work up No results for input(s): VITAMINB12, FOLATE, FERRITIN, TIBC, IRON, RETICCTPCT in the last 72 hours. Urinalysis No results found for: COLORURINE, APPEARANCEUR, LABSPEC, PHURINE, GLUCOSEU, HGBUR, BILIRUBINUR, KETONESUR, PROTEINUR, UROBILINOGEN, NITRITE, LEUKOCYTESUR Sepsis Labs Invalid input(s): PROCALCITONIN,  WBC,  LACTICIDVEN     SIGNED:  Maxie Barb, MD  [facility] 05/04/2017, 4:05 PM Pager   If 7PM-7AM, please contact night-coverage [email or web address] Password [email or web address]

## 2017-04-30 NOTE — Progress Notes (Signed)
This Rt heard code blue alarm for on 2w as walking by.  This RT entered to find pt unresponsive and RN/TECHS putting pads on pt.  RT started bagging pt while compressions were started.  Merlene Laughter, RRT joined this RT and placed ETT, 7.5 at 23 at the lips and secured.  This RT continued to bag pt during compressions until pulses returned and pt was transported to 5M13 without event.  5M RT had vent prepared and placed pt on vent on arrival.

## 2017-04-30 DEATH — deceased

## 2017-05-15 ENCOUNTER — Ambulatory Visit: Payer: Self-pay | Admitting: Vascular Surgery

## 2017-05-15 ENCOUNTER — Other Ambulatory Visit: Payer: Self-pay | Admitting: Cardiology

## 2018-06-22 IMAGING — CR DG FOOT COMPLETE 3+V*L*
3 series · 3 of 3 positions shown · non-contrast
Comparison: Left third and fourth toe radiographs from 01/16/2017

CLINICAL DATA: Bilateral foot pain and swelling with open wounds of
the left foot x1 day.

EXAM:
LEFT FOOT - COMPLETE 3+ VIEW

[foot ap]
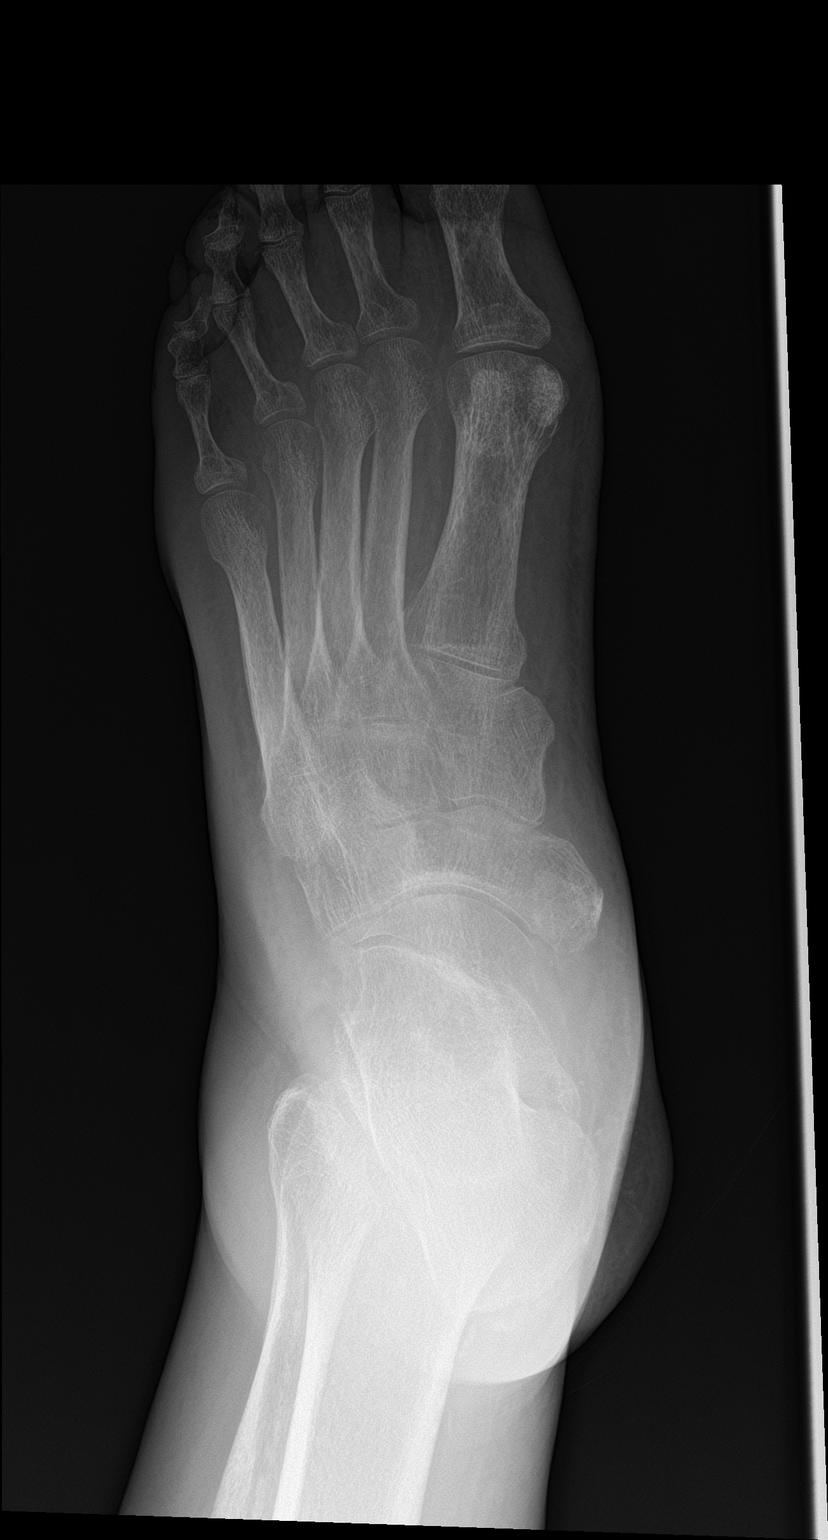

[foot obl]
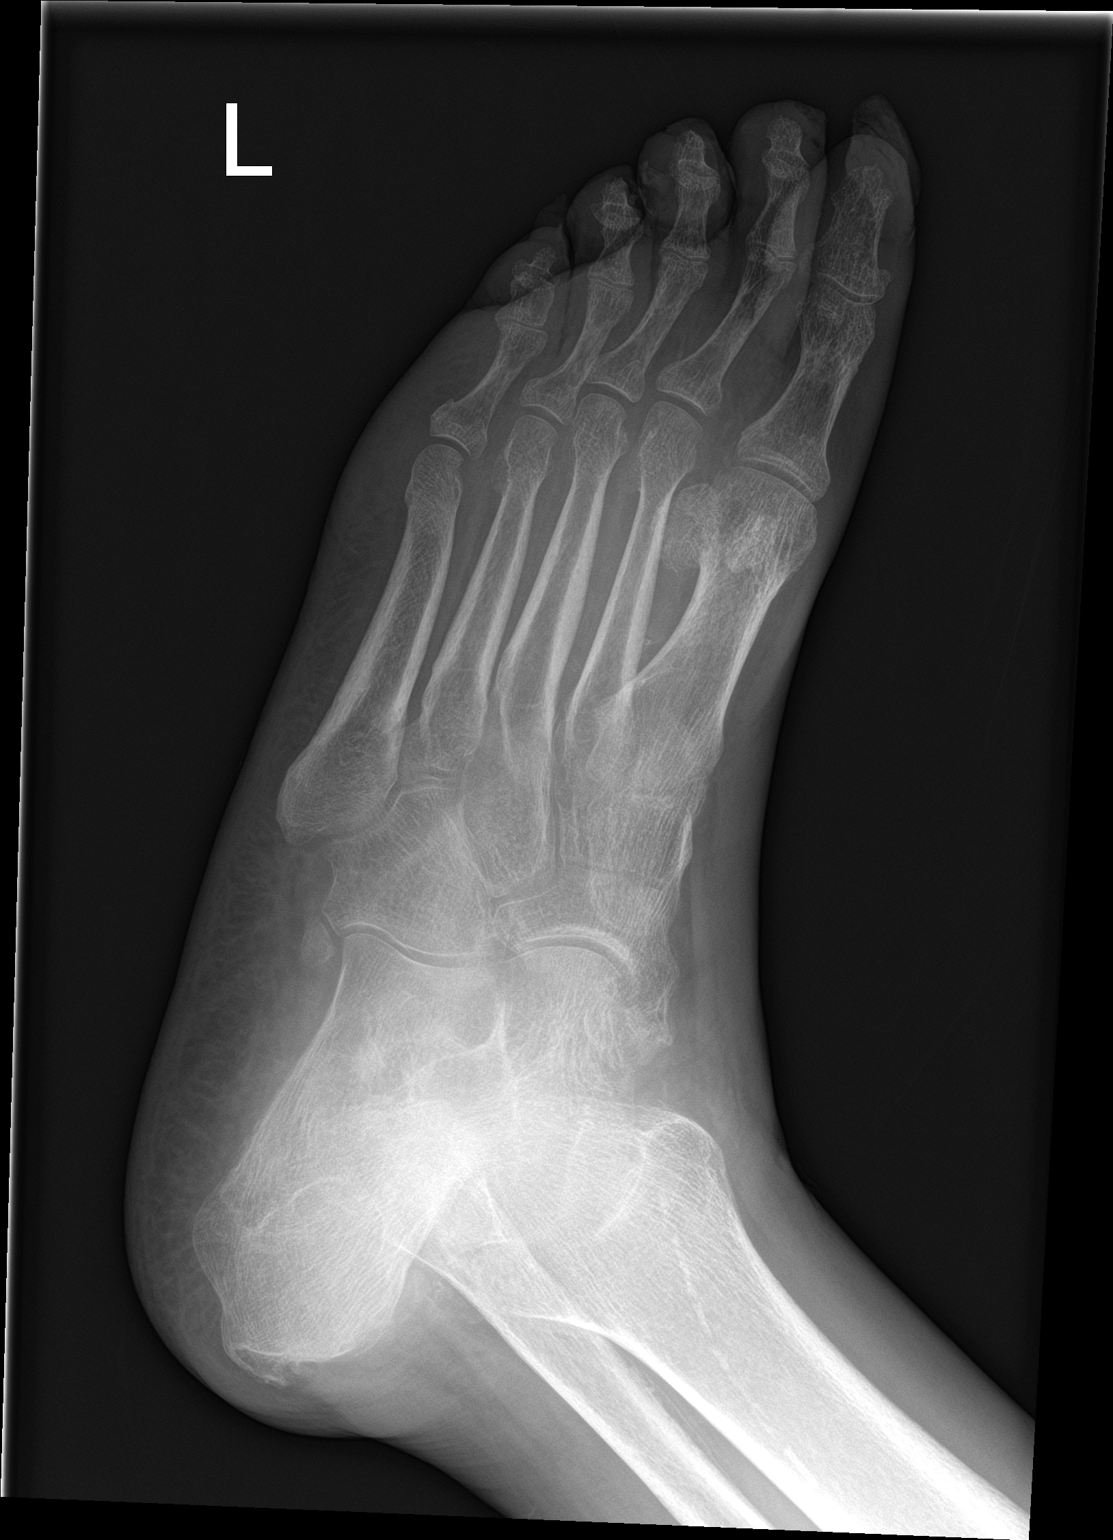

[foot lat]
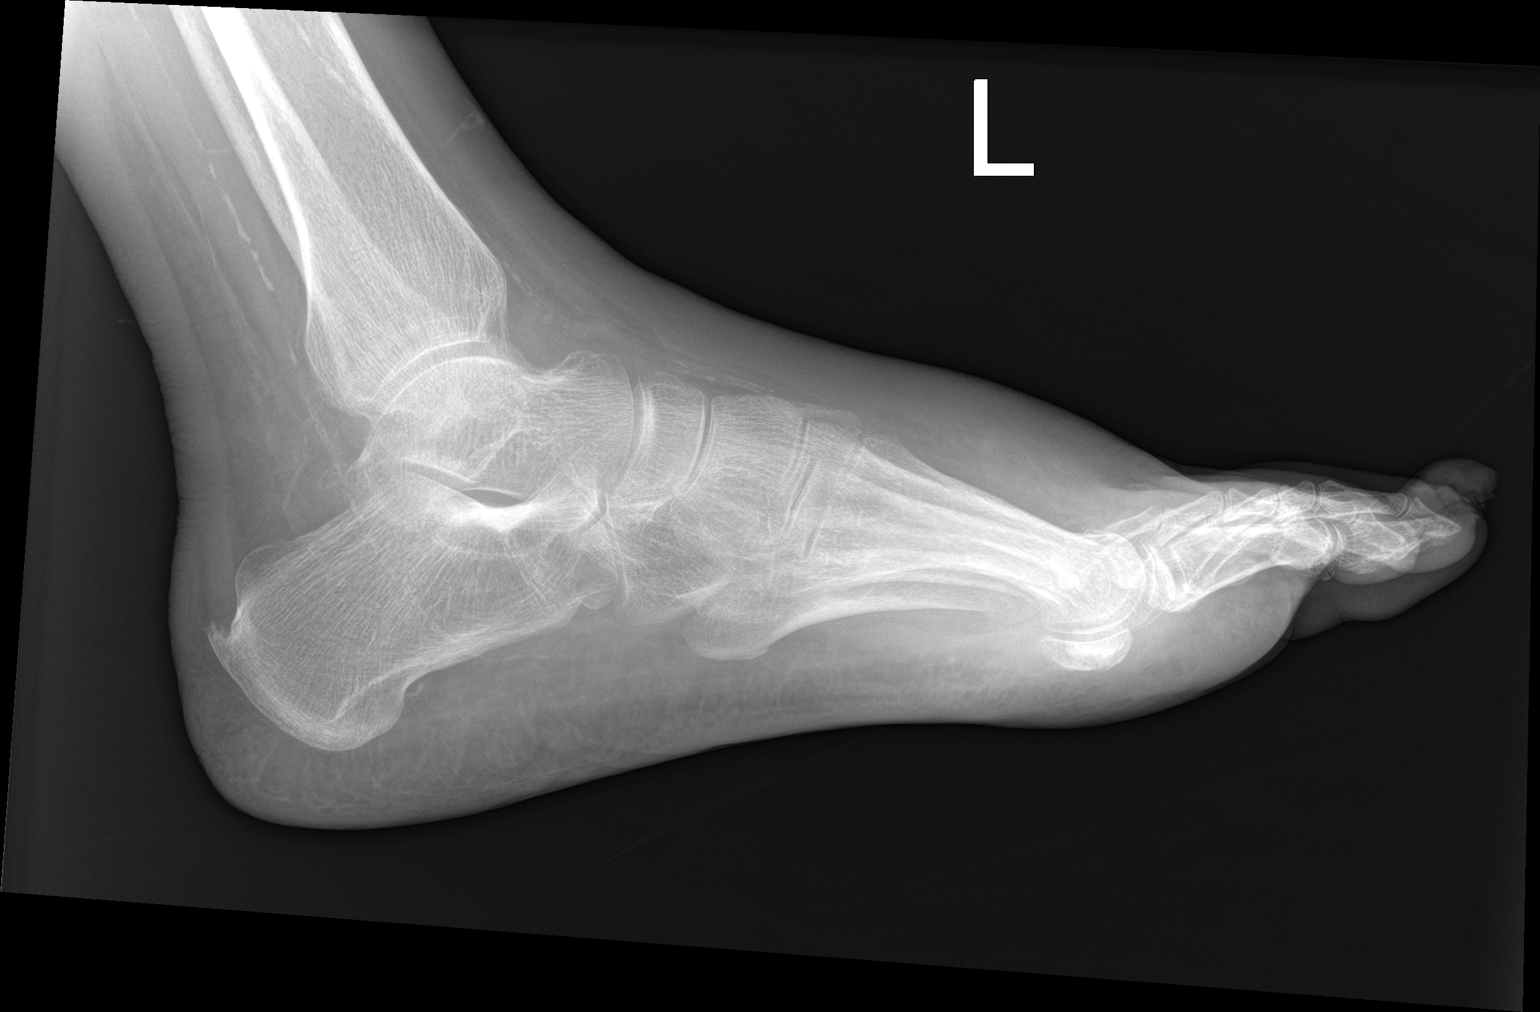

[3 of 3 positions shown; findings below may reference images not displayed]

FINDINGS: Soft tissue ulcerations involving the third and fourth toes without
underlying fracture nor apparent bone destruction. Joint spaces are
intact. The bones are osteopenic. Dorsal calcaneal enthesophyte is
noted. Vascular calcifications about the visualized ankle and foot
consistent with diabetes. Diffuse generalized soft tissue edema and
swelling of the included ankle and dorsum of the foot.
IMPRESSION: Generalized soft tissue swelling of the included ankle and foot more
so along the dorsum of the forefoot. Soft tissue ulcerations noted
of the third and fourth toes without frank bone destruction,
fracture nor dislocations.

## 2018-06-22 IMAGING — CR DG FOOT 2V*R*
2 series · 2 of 2 positions shown · non-contrast
Comparison: None.

CLINICAL DATA: Bilateral foot pain.  Open wound on left foot.

EXAM:
RIGHT FOOT - 2 VIEW

[foot ap]
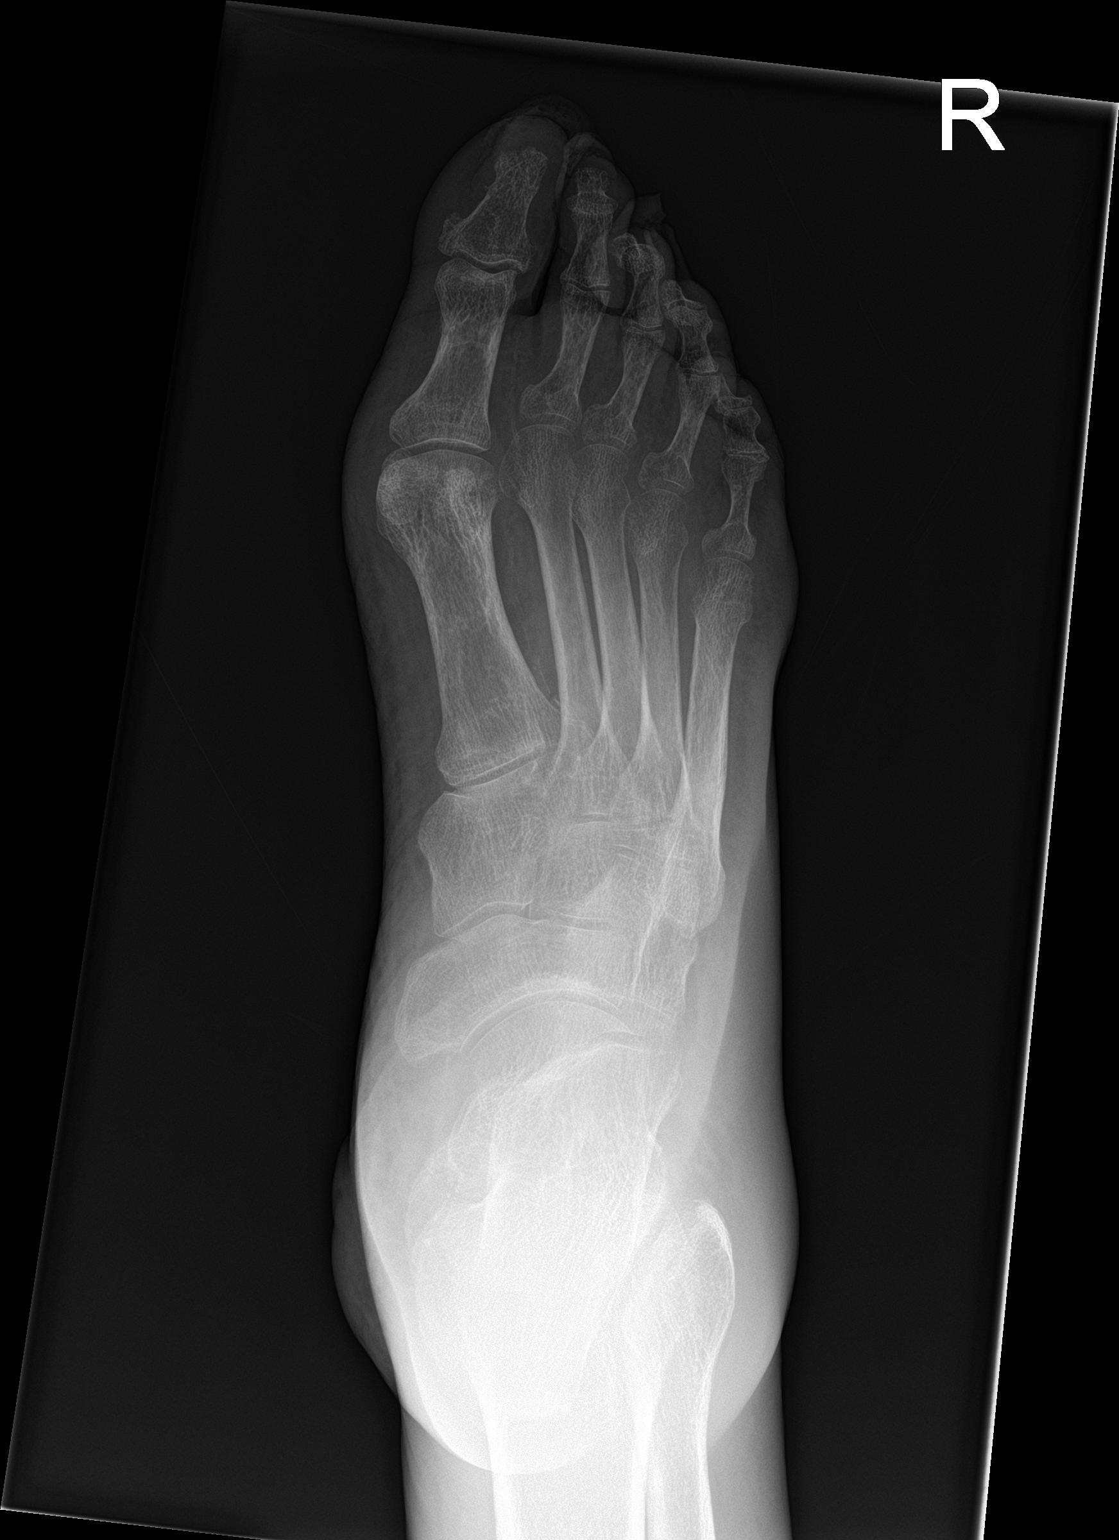

[foot lat]
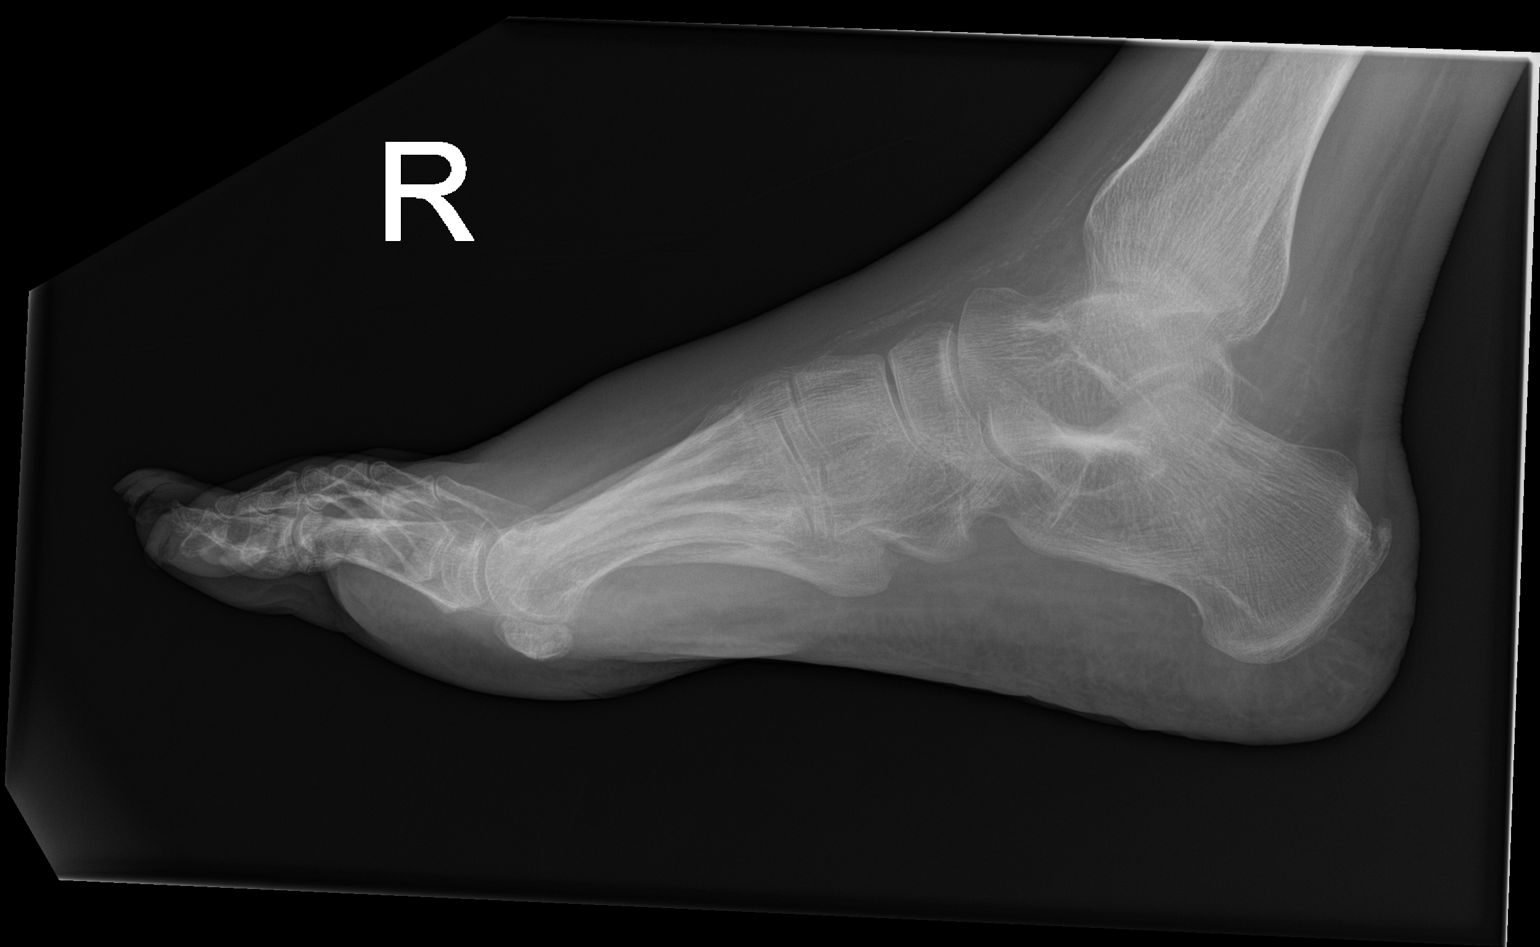

[2 of 2 positions shown; findings below may reference images not displayed]

FINDINGS: There is moderate, diffuse soft tissue swelling of the left foot.
There is no osteolysis. No soft tissue emphysema. Dorsal vascular
calcifications are noted.
IMPRESSION: Diffuse soft tissue swelling of the left foot. No evidence of active
osteomyelitis.
